# Patient Record
Sex: Male | Born: 1940 | Race: Black or African American | Hispanic: No | Marital: Single | Smoking: Former smoker
Health system: Southern US, Community
[De-identification: ages and names within clinical notes are randomized; demographics above are authoritative.]

## PROBLEM LIST (undated history)

## (undated) DIAGNOSIS — R569 Unspecified convulsions: Secondary | ICD-10-CM

## (undated) DIAGNOSIS — I10 Essential (primary) hypertension: Secondary | ICD-10-CM

## (undated) DIAGNOSIS — G309 Alzheimer's disease, unspecified: Secondary | ICD-10-CM

## (undated) DIAGNOSIS — F0280 Dementia in other diseases classified elsewhere without behavioral disturbance: Secondary | ICD-10-CM

---

## 2000-03-12 ENCOUNTER — Encounter: Admission: RE | Admit: 2000-03-12 | Discharge: 2000-03-12 | Payer: Self-pay | Admitting: Internal Medicine

## 2000-04-02 ENCOUNTER — Encounter: Admission: RE | Admit: 2000-04-02 | Discharge: 2000-04-02 | Payer: Self-pay | Admitting: Internal Medicine

## 2000-04-23 ENCOUNTER — Encounter: Admission: RE | Admit: 2000-04-23 | Discharge: 2000-04-23 | Payer: Self-pay | Admitting: Internal Medicine

## 2000-05-14 ENCOUNTER — Encounter: Admission: RE | Admit: 2000-05-14 | Discharge: 2000-05-14 | Payer: Self-pay | Admitting: Internal Medicine

## 2000-06-25 ENCOUNTER — Encounter: Admission: RE | Admit: 2000-06-25 | Discharge: 2000-06-25 | Payer: Self-pay | Admitting: Internal Medicine

## 2001-04-09 ENCOUNTER — Encounter: Admission: RE | Admit: 2001-04-09 | Discharge: 2001-04-09 | Payer: Self-pay | Admitting: Internal Medicine

## 2001-04-10 ENCOUNTER — Ambulatory Visit (HOSPITAL_COMMUNITY): Admission: RE | Admit: 2001-04-10 | Discharge: 2001-04-10 | Payer: Self-pay | Admitting: Internal Medicine

## 2001-04-10 ENCOUNTER — Encounter: Admission: RE | Admit: 2001-04-10 | Discharge: 2001-04-10 | Payer: Self-pay | Admitting: Internal Medicine

## 2001-04-10 ENCOUNTER — Encounter: Payer: Self-pay | Admitting: Internal Medicine

## 2001-04-10 ENCOUNTER — Inpatient Hospital Stay (HOSPITAL_COMMUNITY): Admission: EM | Admit: 2001-04-10 | Discharge: 2001-04-17 | Payer: Self-pay | Admitting: Emergency Medicine

## 2001-04-13 ENCOUNTER — Encounter: Payer: Self-pay | Admitting: Internal Medicine

## 2001-04-14 ENCOUNTER — Encounter: Payer: Self-pay | Admitting: Internal Medicine

## 2001-04-25 ENCOUNTER — Encounter: Admission: RE | Admit: 2001-04-25 | Discharge: 2001-04-25 | Payer: Self-pay | Admitting: Internal Medicine

## 2001-06-24 ENCOUNTER — Encounter: Admission: RE | Admit: 2001-06-24 | Discharge: 2001-06-24 | Payer: Self-pay | Admitting: Internal Medicine

## 2001-08-29 ENCOUNTER — Encounter: Admission: RE | Admit: 2001-08-29 | Discharge: 2001-08-29 | Payer: Self-pay | Admitting: Internal Medicine

## 2012-11-23 ENCOUNTER — Observation Stay (HOSPITAL_COMMUNITY): Payer: Medicare Other

## 2012-11-23 ENCOUNTER — Emergency Department (HOSPITAL_COMMUNITY): Payer: Medicare Other

## 2012-11-23 ENCOUNTER — Encounter (HOSPITAL_COMMUNITY): Payer: Self-pay | Admitting: *Deleted

## 2012-11-23 ENCOUNTER — Inpatient Hospital Stay (HOSPITAL_COMMUNITY)
Admission: EM | Admit: 2012-11-23 | Discharge: 2012-11-29 | DRG: 064 | Disposition: A | Discharge status: Skilled Nursing Facility | Payer: Medicare Other | Attending: Family Medicine | Admitting: Family Medicine

## 2012-11-23 DIAGNOSIS — R269 Unspecified abnormalities of gait and mobility: Secondary | ICD-10-CM | POA: Diagnosis present

## 2012-11-23 DIAGNOSIS — F0150 Vascular dementia without behavioral disturbance: Secondary | ICD-10-CM | POA: Diagnosis present

## 2012-11-23 DIAGNOSIS — R32 Unspecified urinary incontinence: Secondary | ICD-10-CM | POA: Diagnosis present

## 2012-11-23 DIAGNOSIS — I739 Peripheral vascular disease, unspecified: Secondary | ICD-10-CM | POA: Diagnosis present

## 2012-11-23 DIAGNOSIS — F1021 Alcohol dependence, in remission: Secondary | ICD-10-CM | POA: Diagnosis present

## 2012-11-23 DIAGNOSIS — Z9181 History of falling: Secondary | ICD-10-CM

## 2012-11-23 DIAGNOSIS — A530 Latent syphilis, unspecified as early or late: Secondary | ICD-10-CM | POA: Diagnosis present

## 2012-11-23 DIAGNOSIS — I498 Other specified cardiac arrhythmias: Secondary | ICD-10-CM | POA: Diagnosis present

## 2012-11-23 DIAGNOSIS — F17200 Nicotine dependence, unspecified, uncomplicated: Secondary | ICD-10-CM | POA: Diagnosis present

## 2012-11-23 DIAGNOSIS — I4891 Unspecified atrial fibrillation: Secondary | ICD-10-CM | POA: Diagnosis present

## 2012-11-23 DIAGNOSIS — Z8673 Personal history of transient ischemic attack (TIA), and cerebral infarction without residual deficits: Secondary | ICD-10-CM

## 2012-11-23 DIAGNOSIS — I6350 Cerebral infarction due to unspecified occlusion or stenosis of unspecified cerebral artery: Principal | ICD-10-CM | POA: Diagnosis present

## 2012-11-23 DIAGNOSIS — F4489 Other dissociative and conversion disorders: Secondary | ICD-10-CM

## 2012-11-23 DIAGNOSIS — G9340 Encephalopathy, unspecified: Secondary | ICD-10-CM | POA: Diagnosis present

## 2012-11-23 DIAGNOSIS — E873 Alkalosis: Secondary | ICD-10-CM | POA: Diagnosis present

## 2012-11-23 DIAGNOSIS — I63531 Cerebral infarction due to unspecified occlusion or stenosis of right posterior cerebral artery: Secondary | ICD-10-CM

## 2012-11-23 DIAGNOSIS — I10 Essential (primary) hypertension: Secondary | ICD-10-CM | POA: Diagnosis present

## 2012-11-23 DIAGNOSIS — A5210 Symptomatic neurosyphilis, unspecified: Secondary | ICD-10-CM

## 2012-11-23 DIAGNOSIS — E876 Hypokalemia: Secondary | ICD-10-CM | POA: Diagnosis present

## 2012-11-23 HISTORY — DX: Essential (primary) hypertension: I10

## 2012-11-23 LAB — COMPREHENSIVE METABOLIC PANEL
Alkaline Phosphatase: 66 U/L (ref 39–117)
BUN: 15 mg/dL (ref 6–23)
CO2: 36 mEq/L — ABNORMAL HIGH (ref 19–32)
Creatinine, Ser: 1.07 mg/dL (ref 0.50–1.35)
GFR calc Af Amer: 80 mL/min — ABNORMAL LOW (ref >90)
GFR calc non Af Amer: 69 mL/min — ABNORMAL LOW (ref >90)
Glucose, Bld: 102 mg/dL — ABNORMAL HIGH (ref 70–99)
Sodium: 141 mEq/L (ref 135–145)
Total Bilirubin: 0.4 mg/dL (ref 0.3–1.2)
Total Protein: 8.1 g/dL (ref 6.0–8.3)

## 2012-11-23 LAB — CBC
Hemoglobin: 14.5 g/dL (ref 13.0–17.0)
MCH: 32.5 pg (ref 26.0–34.0)
MCH: 32 pg (ref 26.0–34.0)
MCHC: 35.8 g/dL (ref 30.0–36.0)
MCV: 90.7 fL (ref 78.0–100.0)
MCV: 90.9 fL (ref 78.0–100.0)
Platelets: 209 10*3/uL (ref 150–400)
Platelets: 200 10*3/uL (ref 150–400)
RBC: 4.53 MIL/uL (ref 4.22–5.81)
RDW: 14 % (ref 11.5–15.5)
WBC: 5.4 10*3/uL (ref 4.0–10.5)

## 2012-11-23 LAB — URINALYSIS, ROUTINE W REFLEX MICROSCOPIC
Bilirubin Urine: NEGATIVE
Ketones, ur: NEGATIVE mg/dL
Leukocytes, UA: NEGATIVE
Nitrite: NEGATIVE
Protein, ur: NEGATIVE mg/dL

## 2012-11-23 LAB — RAPID URINE DRUG SCREEN, HOSP PERFORMED
Amphetamines: NOT DETECTED
Barbiturates: NOT DETECTED
Benzodiazepines: NOT DETECTED
Opiates: NOT DETECTED
Tetrahydrocannabinol: NOT DETECTED

## 2012-11-23 LAB — GLUCOSE, CAPILLARY: Glucose-Capillary: 104 mg/dL — ABNORMAL HIGH (ref 70–99)

## 2012-11-23 MED ORDER — ASPIRIN 81 MG PO CHEW
324.0000 mg | CHEWABLE_TABLET | Freq: Once | ORAL | Status: AC
  Administered 2012-11-23: 324 mg via ORAL
  Filled 2012-11-23: qty 4

## 2012-11-23 MED ORDER — CLONIDINE HCL 0.1 MG PO TABS
0.1000 mg | ORAL_TABLET | Freq: Once | ORAL | Status: AC
  Administered 2012-11-23: 0.1 mg via ORAL
  Filled 2012-11-23: qty 1

## 2012-11-23 MED ORDER — THIAMINE HCL 100 MG/ML IJ SOLN
100.0000 mg | Freq: Every day | INTRAMUSCULAR | Status: DC
  Filled 2012-11-23: qty 1

## 2012-11-23 MED ORDER — ASPIRIN EC 81 MG PO TBEC
81.0000 mg | DELAYED_RELEASE_TABLET | Freq: Every day | ORAL | Status: DC
  Administered 2012-11-23 – 2012-11-29 (×7): 81 mg via ORAL
  Filled 2012-11-23 (×7): qty 1

## 2012-11-23 MED ORDER — SODIUM CHLORIDE 0.9 % IJ SOLN: 3.0000 mL | INTRAMUSCULAR | Status: DC | PRN

## 2012-11-23 MED ORDER — HEPARIN SODIUM (PORCINE) 5000 UNIT/ML IJ SOLN
5000.0000 [IU] | Freq: Three times a day (TID) | INTRAMUSCULAR | Status: DC
  Administered 2012-11-23 – 2012-11-29 (×15): 5000 [IU] via SUBCUTANEOUS
  Filled 2012-11-23 (×20): qty 1

## 2012-11-23 MED ORDER — SODIUM CHLORIDE 0.9 % IV BOLUS (SEPSIS)
500.0000 mL | Freq: Once | INTRAVENOUS | Status: AC
  Administered 2012-11-23: 500 mL via INTRAVENOUS

## 2012-11-23 MED ORDER — INFLUENZA VAC SPLIT QUAD 0.5 ML IM SUSP
0.5000 mL | INTRAMUSCULAR | Status: AC
  Administered 2012-11-24: 11:00:00 0.5 mL via INTRAMUSCULAR
  Filled 2012-11-23: qty 0.5

## 2012-11-23 MED ORDER — ASPIRIN 81 MG PO CHEW: 324.0000 mg | CHEWABLE_TABLET | Freq: Once | ORAL | Status: DC

## 2012-11-23 MED ORDER — SODIUM CHLORIDE 0.9 % IJ SOLN
3.0000 mL | Freq: Two times a day (BID) | INTRAMUSCULAR | Status: DC
  Administered 2012-11-23 – 2012-11-29 (×7): 3 mL via INTRAVENOUS

## 2012-11-23 MED ORDER — SODIUM CHLORIDE 0.9 % IV SOLN: 250.0000 mL | INTRAVENOUS | Status: DC | PRN

## 2012-11-23 MED ORDER — HYDRALAZINE HCL 20 MG/ML IJ SOLN: 2.0000 mg | INTRAMUSCULAR | Status: DC | PRN

## 2012-11-23 MED ORDER — PNEUMOCOCCAL VAC POLYVALENT 25 MCG/0.5ML IJ INJ
0.5000 mL | INJECTION | INTRAMUSCULAR | Status: AC
  Administered 2012-11-24: 11:00:00 0.5 mL via INTRAMUSCULAR
  Filled 2012-11-23: qty 0.5

## 2012-11-23 MED ORDER — SODIUM CHLORIDE 0.9 % IJ SOLN
3.0000 mL | Freq: Two times a day (BID) | INTRAMUSCULAR | Status: DC
  Administered 2012-11-24 – 2012-11-29 (×7): 3 mL via INTRAVENOUS

## 2012-11-23 NOTE — ED Notes (Signed)
Heavy drinker until 5 years ago.  Hx hypertension

## 2012-11-23 NOTE — ED Notes (Addendum)
Per pt's niece pt fell yesterday and hit his head on the door. Pt is having increased confusion for about a week. Per niece he has been having some issues with his memory before the fall but it has been getting increasingly worse. Pt has been falling.

## 2012-11-23 NOTE — H&P (Signed)
Family Medicine [facility] Admission History and Physical Service Pager: [phone]  Patient name: Joshua Gonzales Medical record number: [id number] Date of birth: Sep 29, 1940 Age: 72 y.o. Gender: male  Primary Care Provider: Pcp Not In System Consultants: None Code Status: Full  Chief Complaint: confusion and increased falls  Assessment and Plan: Joshua Gonzales is a 72 y.o. male presenting with encephalopathy for the past 3 weeks, worsening acutely. PMH is significant for heavy drinking and smoking, uncontrolled HTN and afib  # Encephalopathy and multiple prev infarcts/atrophy - DDx includes vascular dementia vs toxic encephalopathy vs vitamin deficiency vs residual stroke deficit vs infectious. Per pt's niece has been acting increasing confused in the last 2-3 weeks. Extensive drinking hx concerning for wernicke's encephalopathy especially in light of cautious gait. Metabolic panel looks relatively unremarkable however will obtain B12/folate levels as well as ammonia. Possible supratherapeutic ingestion of tylenol or salicylates given confusion, will obtain levels here. Also concern for tia given intermittent episodes of worsening mental status. However also with steady decline and findings of multiple prev infarcts and extensive microvascular dx on CT most consistent with a vascular dementia picture. Has not followed closely with PCP so unclear pt's baseline and potential for progressive medical illness that has gone undiagnosed for long period of time. Seems to have severely uncontrolled HTN which also puts pt a risk for cerebral vasc risk. Does not appear to be infectious as pt afebrile and normal HR and no clear source. UA clean. UDS neg, alcohol nml. Plan: -swallow study -CIWA- given chronic alcohol use -ammonia -b12, folate -start thiamine 100mg  IV daily -tele -vs -consider blood cx if febrile -rectal exam and gait eval in am -re-clarify code status  #Prev abnormal  stress test, abn EKG today, irregular rhythm on exam Prior NM study 2003 with sm reversible perfusion defect at anteroseptal portiion of left ventricular apex. Normal EF, question of mild hypokinesia. EKG today shows u-waves in II, III, Avf by my read concerning for prior inferior infarct? No st elevations. At this time do not believe this to be MI/cardiac picture but will obtain morning EKG, cycle trops.Does have hx of smoking.  Plan: -risk stratify- TSH, A1C, Lipids -cycle trops X2 -repeat EKG am -consider ECHO -ASA 81 -telemetry - monitor for atrial fibrillation and consider anticoag though ?risk>benefit  #HTN- systolics 120s-200s. Could this be a hypertensive emergency picture? Patient does not follow with PCP and notable for poor dietary/exercise health. Niece was not aware that he has a hx of high BP so was not monitoring at home. S/p clonidine in the ED. Plan -hydralazine prn SBP>180 or DBP>100 -will hold on bb given slight brady and monitor  #metabolic alkalosis- bicarb mildly elevated 36. No other electrolyte imbalances. Can be 2/2 to GI or renal hydrogen loss. Does not appear to be intracellular shift of hydrogen given normal K.  Plan -monitor vs -repeat BMET am  FEN/GI: Bedside swallow with normal swallow done by nursing; ordering regular diet Prophylaxis: heparin SQ  Disposition: admit to tele for obvs, w/up of ams  History of Present Illness: Joshua Gonzales is a 72 y.o. male without PCP or known hx of cva or dementia who presents with AMS for the past 3 weeks associated with intermittent urinary incontinence progressively getting worse. Has extensive hx of smoking and heavy drinking. Lives with niece who noticed him acting unusual and having increased falls, one of which caused him to hit his head yesterday. No syncopal episodes. No chest pain/palps/fluid overload/cardiovascular history  that he can remember. Feels that he just mis-stepped. Believes his niece to be his sister.  Also has been having memory problems for the last week. Does not go to a PCP on a regular basis. No other new changes to his current health.  In the ED, CT with multiple prior infarctions and extensive microvascular disease, no acute process. U/A clean. CBG 104. BP 200s/100s in ED, being given clonidine.  Review Of Systems: Per HPI with the following additions: Patient denies abd pain, fevers, chills. Otherwise 12 point review of systems was performed and was unremarkable.  There are no active problems to display for this patient.  Past Medical History: Past Medical History  Diagnosis Date  . Hypertension   ? Remote hx of dx with "irregular heart rate" he was told about at a previous job  Past Surgical History: History reviewed. No pertinent past surgical history. Social History: History  Substance Use Topics  . Smoking status: Current Every Day Smoker  . Smokeless tobacco: Not on file  . Alcohol Use: No   Additional social history:  - chronic heavy alcohol use (none after moved in with niece in last 4 months), smoking use chronically, denies drug use other than distant hx of marijuana use - lives with niece, prev lived with nephew for the past 3 year - Does not work - Able to care for self though with increasing confusion, niece has no longer allowed him to cook  Please also refer to relevant sections of EMR.  Family History: No family history on file. Allergies and Medications: No Known Allergies No current facility-administered medications on file prior to encounter.   No current outpatient prescriptions on file prior to encounter.    Objective: BP 167/88  Pulse 67  Temp(Src) 99.5 F (37.5 C) (Oral)  Resp 16  SpO2 100% Exam: General: NAD, sitting quietly in bed, niece at side HEENT: no exudates, nml pharnyx, PERRL, EOMI, cotton wool spots noted on left eye, no lymphadenopathy, o/p clear, MMM Cardiovascular: irreg, irreg with no murmurs or rubs, 2+ bilateral  radial pulses Respiratory: CTAB, normal effort Abdomen: s/nt/nd, no hepatosplenomegaly Extremities: warm and well perfused Skin: no jaundice, bruising or bleeding Neuro: confused as to why he is here, not oriented to date or city but knows in [facility] hospital, no focal deficits, no asterixis, normal speech, CN 2-12 tested and in tact with good shrug, grimace, tongue movement, sternocleidomastoid strength, sensation bilateral face equal, PERRL, EOMI, forehead wrinkle, and upper extremity strength and sound grossly equal bilaterally  Labs and Imaging: CBC BMET   Recent Labs Lab 11/23/12 1614  WBC 5.4  HGB 15.7  HCT 43.8  PLT 209    Recent Labs Lab 11/23/12 1614  NA 141  K 3.5  CL 98  CO2 36*  BUN 15  CREATININE 1.07  GLUCOSE 102*  CALCIUM 9.5     CT head IMPRESSION:  Atrophy with multiple prior infarcts and extensive small vessel  disease. No acute appearing infarct is appreciable on this study.  There is no hemorrhage or mass effect.   Anselm Lis, MD 11/23/2012, 6:54 PM PGY-1, [facility] FPTS Intern pager: [phone], text pages welcome  I have seen and examined patient with PGY-1 and agree with assessment and plan detailed above with my additions in blue. Leona Singleton, MD 11/23/2012 8:58 PM PGY-2

## 2012-11-23 NOTE — ED Provider Notes (Signed)
CSN: [id number]     Arrival date & time 11/23/12  1600 History   First MD Initiated Contact with Patient 11/23/12 1626     Chief Complaint  Patient presents with  . Altered Mental Status   (Consider location/radiation/quality/duration/timing/severity/associated sxs/prior Treatment) HPI Comments: 72 yo male with smoking, etoh hx presents with niece for gradually worsening confusion the past 3 wks and intermittent urinary incontinence.  No known stroke or cns hx.  Pt has had a few falls the past week, hit head once.  No loc.  Pt denies other sxs except fatigue and lightheaded with standing.  Nothing improves.  No known dementia or neuro hx.   Patient is a 72 y.o. male presenting with altered mental status. The history is provided by the patient and a relative.  Altered Mental Status Presenting symptoms: confusion   Associated symptoms: light-headedness   Associated symptoms: no abdominal pain, no fever, no headaches, no rash and no vomiting     Past Medical History  Diagnosis Date  . Hypertension    History reviewed. No pertinent past surgical history. No family history on file. History  Substance Use Topics  . Smoking status: Current Every Day Smoker  . Smokeless tobacco: Not on file  . Alcohol Use: No    Review of Systems  Constitutional: Positive for fatigue. Negative for fever and chills.  HENT: Negative for neck pain and neck stiffness.   Eyes: Negative for visual disturbance.  Respiratory: Negative for shortness of breath.   Cardiovascular: Negative for chest pain.  Gastrointestinal: Negative for vomiting and abdominal pain.  Genitourinary: Negative for dysuria and flank pain.  Musculoskeletal: Negative for back pain.  Skin: Negative for rash.  Neurological: Positive for light-headedness. Negative for syncope, speech difficulty and headaches.  Psychiatric/Behavioral: Positive for confusion.    Allergies  Review of patient's allergies indicates not on file.  Home  Medications  No current outpatient prescriptions on file. BP 155/85  Pulse 83  Temp(Src) 99.5 F (37.5 C) (Oral)  Resp 24  SpO2 96% Physical Exam  Nursing note and vitals reviewed. Constitutional: He appears well-developed and well-nourished.  HENT:  Head: Normocephalic and atraumatic.  Eyes: Conjunctivae are normal. Right eye exhibits no discharge. Left eye exhibits no discharge.  Neck: Normal range of motion. Neck supple. No tracheal deviation present.  Cardiovascular: Normal rate and regular rhythm.   Pulmonary/Chest: Effort normal and breath sounds normal.  Abdominal: Soft. He exhibits no distension. There is no tenderness. There is no guarding.  Musculoskeletal: He exhibits no edema and no tenderness.  Neurological: He is alert. No cranial nerve deficit. Coordination normal. GCS eye subscore is 4. GCS verbal subscore is 5. GCS motor subscore is 6.  4+ strength in UE and LE with f/e at major joints. Sensation to palpation intact in UE and LE. CNs 2-12 grossly intact.  EOMFI.  PERRL.   Finger nose and coordination intact bilateral.   Visual fields intact to finger testing. Mild general weakness Alert to place and name Intermittent confusion Cautious gait Knows date, location, name, dob Incorrectly named niece  Skin: Skin is warm. No rash noted.  Psychiatric: He has a normal mood and affect.    ED Course  Procedures (including critical care time) Labs Review Labs Reviewed  COMPREHENSIVE METABOLIC PANEL - Abnormal; Notable for the following:    CO2 36 (*)    Glucose, Bld 102 (*)    GFR calc non Af Amer 69 (*)    GFR calc Af Amer 80 (*)  All other components within normal limits  GLUCOSE, CAPILLARY - Abnormal; Notable for the following:    Glucose-Capillary 164 (*)    All other components within normal limits  GLUCOSE, CAPILLARY - Abnormal; Notable for the following:    Glucose-Capillary 104 (*)    All other components within normal limits  CREATININE, SERUM -  Abnormal; Notable for the following:    GFR calc non Af Amer 75 (*)    GFR calc Af Amer 87 (*)    All other components within normal limits  COMPREHENSIVE METABOLIC PANEL - Abnormal; Notable for the following:    Potassium 3.0 (*)    Albumin 3.2 (*)    GFR calc non Af Amer 84 (*)    All other components within normal limits  SALICYLATE LEVEL - Abnormal; Notable for the following:    Salicylate Lvl 2.6 (*)    All other components within normal limits  HEMOGLOBIN A1C - Abnormal; Notable for the following:    Hemoglobin A1C 6.7 (*)    Mean Plasma Glucose 146 (*)    All other components within normal limits  RPR - Abnormal; Notable for the following:    RPR Reactive (*)    All other components within normal limits  RPR TITER - Abnormal; Notable for the following:    RPR Titer 1:16 (*)    All other components within normal limits  CBC  URINALYSIS, ROUTINE W REFLEX MICROSCOPIC  URINE RAPID DRUG SCREEN (HOSP PERFORMED)  ETHANOL  CBC  AMMONIA  VITAMIN B12  TROPONIN I  TROPONIN I  ACETAMINOPHEN LEVEL  TSH  LIPID PANEL  HIV ANTIBODY (ROUTINE TESTING)  FOLATE RBC  BASIC METABOLIC PANEL   Imaging Review No results found.  MDM  No diagnosis found. Broad differential with mild intermittent confusion from metabolic, UTI, CNS bleed/ stroke, Hydrocephalus, dehydration, other. Labs, CT head, fluids.  X-ray Chest Pa And Lateral   11/23/2012   *RADIOLOGY REPORT*  Clinical Data: Subacute encephalopathy.  Stroke and hypertension.  CHEST - 2 VIEW  Comparison: None.  Findings: Heart size appears upper normal.  Thoracic aorta and hilar contours are normal.  Advanced emphysematous changes are present.  No focal airspace opacity, or pneumothorax.  The left costophrenic angle is partially excluded from the image.  No visible pleural effusion. No acute osseous abnormality.  IMPRESSION: Advanced emphysematous changes.  No definite acute findings.   Original Report Authenticated By: Britta Mccreedy,  M.D.   Ct Head Wo Contrast  11/23/2012   CLINICAL DATA:  Altered mental status ; recent trauma  EXAM: CT HEAD WITHOUT CONTRAST  TECHNIQUE: Contiguous axial images were obtained from the base of the skull through the vertex without intravenous contrast. Study was obtained within 24 hr of patient's arrival at the emergency department.  COMPARISON:  September 11, 2002  FINDINGS: There is moderate diffuse atrophy. There is no demonstrable mass, hemorrhage, extra-axial fluid collection, or midline shift. There is evidence of an old infarct in the inferior posterior left cerebellum with encephalomalacia in this area. There is evidence of a prior infarct at the junction of the the right temporal, occipital, and parietal lobes. There is evidence of a prior infarct in the medial right frontal lobe. There is widespread small vessel disease throughout the centra semiovale bilaterally. There is evidence of a prior small infarct involving portions of the anterior limb of the left internal capsule and left globus pallidum. There is evidence of a prior infarct in a portion of the head of the  caudate nucleus on the right as well as the anterior right lentiform nucleus. There is evidence of prior small infarct in the anterior superior left thalamus. No convincing acute infarct is seen on this study.  Bony calvarium appears intact. Mastoid air cells are clear.  IMPRESSION: Atrophy with multiple prior infarcts and extensive small vessel disease. No acute appearing infarct is appreciable on this study. There is no hemorrhage or mass effect.   Electronically Signed   By: Bretta Bang   On: 11/23/2012 17:14   Spoke with medicine, accepted for further eval of confusion and strokes.  Prior infarcts, Confusion  Enid Skeens, MD 11/25/12 [phone]

## 2012-11-23 NOTE — ED Notes (Signed)
Family Practice at bedside.

## 2012-11-23 NOTE — Progress Notes (Signed)
MD order a bedside swallow evaluation for patient. Nurse from [facility] [address] was unable to assist patient. MD asked that patient just be checked for ability to swallow. RN and charge nurse gave patient small sips of water before allowing him to swallow a small cup of water. Patient swallowed water without coughing or choking. Patient denies pain, chills, SOB. Will continue to monitor.

## 2012-11-23 NOTE — Progress Notes (Signed)
Pt admitted to the unit with niece. Pt is stable, alert and oriented per baseline. Oriented to room, staff, and call bell. Educated to call for any assistance. Bed in lowest position, call bell within reach- will continue to monitor.

## 2012-11-23 NOTE — ED Notes (Signed)
Dr Zavitz at bedside  

## 2012-11-23 NOTE — ED Notes (Signed)
The pt is confused he is disoriented .  The pt has been incontinent of urine .  He thinks this is Wednesday 1999 and the pres is [place].  The pt came in saying that his father died today.  Then he said that his brother died today and his father was sick.  A niece with him reports that his father died Jul 31, 2022 and his brother has been dead for years.  Alert he denies pain even though he fell yesterday.  He has been getting worse for a week.  He does not recognize his sisters.

## 2012-11-24 DIAGNOSIS — I10 Essential (primary) hypertension: Secondary | ICD-10-CM

## 2012-11-24 DIAGNOSIS — I498 Other specified cardiac arrhythmias: Secondary | ICD-10-CM

## 2012-11-24 LAB — LIPID PANEL
Cholesterol: 117 mg/dL (ref 0–200)
HDL: 43 mg/dL (ref >39)
Total CHOL/HDL Ratio: 2.7 RATIO
Triglycerides: 75 mg/dL (ref <150)

## 2012-11-24 LAB — ACETAMINOPHEN LEVEL: Acetaminophen (Tylenol), Serum: <15 ug/mL (ref 10–30)

## 2012-11-24 LAB — COMPREHENSIVE METABOLIC PANEL
ALT: 6 U/L (ref 0–53)
AST: 15 U/L (ref 0–37)
Albumin: 3.2 g/dL — ABNORMAL LOW (ref 3.5–5.2)
Alkaline Phosphatase: 59 U/L (ref 39–117)
CO2: 29 mEq/L (ref 19–32)
Creatinine, Ser: 0.91 mg/dL (ref 0.50–1.35)
GFR calc Af Amer: >90 mL/min (ref >90)
Glucose, Bld: 91 mg/dL (ref 70–99)
Potassium: 3 mEq/L — ABNORMAL LOW (ref 3.5–5.1)
Sodium: 135 mEq/L (ref 135–145)
Total Protein: 6.8 g/dL (ref 6.0–8.3)

## 2012-11-24 LAB — HIV ANTIBODY (ROUTINE TESTING W REFLEX): HIV: NONREACTIVE

## 2012-11-24 LAB — HEMOGLOBIN A1C
Hgb A1c MFr Bld: 6.7 % — ABNORMAL HIGH (ref <5.7)
Mean Plasma Glucose: 146 mg/dL — ABNORMAL HIGH (ref <117)

## 2012-11-24 LAB — CREATININE, SERUM
Creatinine, Ser: 1 mg/dL (ref 0.50–1.35)
GFR calc Af Amer: 87 mL/min — ABNORMAL LOW (ref >90)
GFR calc non Af Amer: 75 mL/min — ABNORMAL LOW (ref >90)

## 2012-11-24 LAB — VITAMIN B12: Vitamin B-12: 270 pg/mL (ref 211–911)

## 2012-11-24 LAB — AMMONIA: Ammonia: 31 umol/L (ref 11–60)

## 2012-11-24 MED ORDER — LORAZEPAM 2 MG/ML IJ SOLN
1.0000 mg | Freq: Four times a day (QID) | INTRAMUSCULAR | Status: AC | PRN
  Administered 2012-11-24 – 2012-11-26 (×2): 1 mg via INTRAVENOUS
  Filled 2012-11-24 (×2): qty 1

## 2012-11-24 MED ORDER — LORAZEPAM 2 MG/ML IJ SOLN
2.0000 mg | Freq: Once | INTRAMUSCULAR | Status: AC
  Administered 2012-11-24: 2 mg via INTRAVENOUS
  Filled 2012-11-24: qty 1

## 2012-11-24 MED ORDER — THIAMINE HCL 100 MG/ML IJ SOLN
500.0000 mg | Freq: Three times a day (TID) | INTRAMUSCULAR | Status: DC
  Filled 2012-11-24 (×2): qty 5

## 2012-11-24 MED ORDER — THIAMINE HCL 100 MG/ML IJ SOLN
250.0000 mg | INTRAVENOUS | Status: DC
  Administered 2012-11-28 – 2012-11-29 (×2): 250 mg via INTRAVENOUS
  Filled 2012-11-24 (×2): qty 2.5

## 2012-11-24 MED ORDER — LORAZEPAM 1 MG PO TABS
1.0000 mg | ORAL_TABLET | Freq: Four times a day (QID) | ORAL | Status: AC | PRN
  Administered 2012-11-25 (×2): 1 mg via ORAL
  Filled 2012-11-24 (×2): qty 1

## 2012-11-24 MED ORDER — THIAMINE HCL 100 MG/ML IJ SOLN
500.0000 mg | Freq: Three times a day (TID) | INTRAVENOUS | Status: AC
  Administered 2012-11-24 – 2012-11-27 (×9): 500 mg via INTRAVENOUS
  Filled 2012-11-24 (×9): qty 5

## 2012-11-24 MED ORDER — AMLODIPINE BESYLATE 5 MG PO TABS: 5.0000 mg | ORAL_TABLET | Freq: Every day | ORAL | Status: DC

## 2012-11-24 MED ORDER — FOLIC ACID 1 MG PO TABS
1.0000 mg | ORAL_TABLET | Freq: Every day | ORAL | Status: DC
  Administered 2012-11-24 – 2012-11-29 (×6): 1 mg via ORAL
  Filled 2012-11-24 (×6): qty 1

## 2012-11-24 MED ORDER — AMLODIPINE BESYLATE 2.5 MG PO TABS
2.5000 mg | ORAL_TABLET | Freq: Every day | ORAL | Status: DC
  Administered 2012-11-24: 2.5 mg via ORAL
  Filled 2012-11-24: qty 1

## 2012-11-24 MED ORDER — POTASSIUM CHLORIDE CRYS ER 20 MEQ PO TBCR
30.0000 meq | EXTENDED_RELEASE_TABLET | Freq: Two times a day (BID) | ORAL | Status: AC
  Administered 2012-11-24 (×2): 30 meq via ORAL
  Filled 2012-11-24 (×3): qty 1

## 2012-11-24 MED ORDER — THIAMINE HCL 100 MG/ML IJ SOLN: 250.0000 mg | Freq: Every day | INTRAMUSCULAR | Status: DC

## 2012-11-24 MED ORDER — ADULT MULTIVITAMIN W/MINERALS CH
1.0000 | ORAL_TABLET | Freq: Every day | ORAL | Status: DC
  Administered 2012-11-24 – 2012-11-29 (×6): 1 via ORAL
  Filled 2012-11-24 (×6): qty 1

## 2012-11-24 MED ORDER — AMLODIPINE BESYLATE 10 MG PO TABS
10.0000 mg | ORAL_TABLET | Freq: Every day | ORAL | Status: DC
  Administered 2012-11-25 – 2012-11-29 (×5): 10 mg via ORAL
  Filled 2012-11-24 (×5): qty 1

## 2012-11-24 MED ORDER — VITAMIN B-1 100 MG PO TABS
100.0000 mg | ORAL_TABLET | Freq: Every day | ORAL | Status: DC
  Administered 2012-11-24: 13:00:00 100 mg via ORAL
  Filled 2012-11-24: qty 1

## 2012-11-24 MED ORDER — AMLODIPINE BESYLATE 5 MG PO TABS
5.0000 mg | ORAL_TABLET | Freq: Every day | ORAL | Status: AC
  Administered 2012-11-24: 22:00:00 5 mg via ORAL
  Filled 2012-11-24: qty 1

## 2012-11-24 NOTE — Evaluation (Signed)
Physical Therapy Evaluation Patient Details Name: Joshua Gonzales MRN: [id number] DOB: 1941-01-27 Today's Date: 11/24/2012 Time: 7829-5621 PT Time Calculation (min): 21 min  PT Assessment / Plan / Recommendation History of Present Illness    Joshua Gonzales is a 72 y.o. male presenting with encephalopathy for the past 3 weeks, worsening acutely. PMH is significant for heavy drinking and smoking, uncontrolled HTN and afib   Clinical Impression  Pt presents with moderate limitations to functional mobility primarily due to metal status, safety awareness and judgement.  Noted functional balance problems, and pt is fall risk.  Unclear if pt has adequate family support to d/c home safely so recommend pursue SNF placement unless has 24 supervision, primarily due to risk of fall/fall related injury and risk for rehospitalization.  Will benefit from PT in acute setting to address functional problems.  Thanks.    PT Assessment  Patient needs continued PT services    Follow Up Recommendations  SNF;Supervision/Assistance - 24 hour    Does the patient have the potential to tolerate intense rehabilitation      Barriers to Discharge Decreased caregiver support (unclear if pt has adequate support for 24 supervision)      Equipment Recommendations  None recommended by PT    Recommendations for Other Services     Frequency Min 3X/week    Precautions / Restrictions Precautions Precautions: Fall Precaution Comments: bed alarm, weakness, impulsivity/confusion   Pertinent Vitals/Pain no apparent distress       Mobility  Bed Mobility Bed Mobility: Supine to Sit;Sit to Supine Supine to Sit: 5: Supervision Sit to Supine: 5: Supervision Details for Bed Mobility Assistance: supervision for safety due to impulsivity and fall risk Transfers Transfers: Sit to Stand;Stand to Sit Sit to Stand: 5: Supervision Stand to Sit: 5: Supervision Details for Transfer Assistance: for safety and  impuslivity Ambulation/Gait Ambulation/Gait Assistance: 4: Min assist Ambulation Distance (Feet): 300 Feet Assistive device: 1 person hand held assist Ambulation/Gait Assistance Details: cues to navigate, to attend to environment and to follow direction, with 1-2 minor loss of balance noted when turning head Gait Pattern: Step-through pattern;Trunk flexed Gait velocity: 1.5 ft/sec (predicts dependency and fall risk) General Gait Details: states he feels he's walking slower than usual Stairs: No Wheelchair Mobility Wheelchair Mobility: No    Exercises     PT Diagnosis: Difficulty walking  PT Problem List: Cardiopulmonary status limiting activity;Decreased safety awareness;Decreased cognition;Decreased mobility;Decreased balance PT Treatment Interventions: Patient/family education;Cognitive remediation;Balance training;Therapeutic exercise;Therapeutic activities;Functional mobility training;Stair training;Gait training     PT Goals(Current goals can be found in the care plan section) Acute Rehab PT Goals PT Goal Formulation: Patient unable to participate in goal setting Time For Goal Achievement: 12/08/12 Potential to Achieve Goals: Good  Visit Information  Last PT Received On: 11/24/12 Assistance Needed: +1       Prior Functioning  Home Living Family/patient expects to be discharged to:: Private residence Living Arrangements: Other relatives Available Help at Discharge: Family Type of Home: House Home Access: Stairs to enter Secretary/administrator of Steps: 1 (onto porch) Home Layout: One level Additional Comments: unable to reliably assess prior level or home living due to metal confusion Prior Function Level of Independence: Independent with assistive device(s);Independent Communication Communication: No difficulties    Cognition  Cognition Arousal/Alertness: Awake/alert Behavior During Therapy: Restless (asking where his clothes went; distractible) Overall  Cognitive Status: No family/caregiver present to determine baseline cognitive functioning Area of Impairment: Safety/judgement;Awareness;Problem solving Memory: Decreased recall of precautions Safety/Judgement: Decreased awareness of  safety Problem Solving: Slow processing;Requires verbal cues General Comments: could not find room without navigational cues Difficult to assess due to:  (confustion)    Extremity/Trunk Assessment Upper Extremity Assessment Upper Extremity Assessment: Overall WFL for tasks assessed Lower Extremity Assessment Lower Extremity Assessment: Overall WFL for tasks assessed   Balance Balance Balance Assessed: Yes High Level Balance High Level Balance Comments: observed during functional tasks with most risk during multitasking and walking/turning  End of Session PT - End of Session Activity Tolerance: Patient tolerated treatment well Patient left: in bed;with call bell/phone within reach;with bed alarm set;with nursing/sitter in room Nurse Communication: Mobility status  GP Functional Assessment Tool Used: clinical judgement Functional Limitation: Mobility: Walking and moving around Mobility: Walking and Moving Around Current Status ([id number]): At least 40 percent but less than 60 percent impaired, limited or restricted Mobility: Walking and Moving Around Goal Status [phone]): At least 20 percent but less than 40 percent impaired, limited or restricted   Joshua Gonzales 11/24/2012, 3:57 PM

## 2012-11-24 NOTE — Progress Notes (Signed)
RN called resident regarding patient's telemetry readings. Patient had been brady for the past 3 hours and continues to get lower. Patients HR has been between [employer]. Pt's cardiac strip tonight was brady as well, (placed in chart). RN has gone into pt's room to assess-pt is alert, denies pain, SOB, chills, and dizziness.will continue to monitor
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
## 2012-11-24 NOTE — Progress Notes (Signed)
Family Medicine Teaching Service Daily Progress Note Intern Pager: [phone]  Patient name: Joshua Gonzales Medical record number: [id number] Date of birth: [birth year] Age: 72 y.o. 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 Also consider wernicke encephalopathy vs NPH with incontinence, dementia, and ataxia. Also consider vitamin deficiency vs infectious though less likely given afebrile and normal WBC. Tylenol and salicylate levels normal. UA and UDS negative, alcohol level and ammonia normal. - Dosed thiamine 100mg  IV daily - Pt will need a PCP on discharge - CIWA - Continue to monitor though suspect much of the deficit may be permanent [ ]  Consulted neurology today for further recommendations [ ]  F/u PT/OT recs [ ]  Checking HIV/RPR today, f/u B12/folate [ ]  F/u telemetry and AM EKG [ ]  Code status needs to be clarified when family member present [ ]  with slight dizziness, monitor PO status  # Sinus arrhythmia, previous abnormal stress test - Prior NM study 2003 with small reversible perfusion defect anteroseptal portion of left ventricular apex, normal EF, ?mild hypokinesia. EKG last night uwaves concerning for prior inferior infarct, no ST elevation. Not MI picture, but cycling troponins. - trops negative x 2 - AM EKG Not done yet - Started aspirin 81 - F/u risk stratification labs (lipid panel with calculated ASCVD risk 26.2% - consider starting mod- or high-intensity statin though balance with age and dementia) - F/u telemetry for any atrial fibrillation; unlikely to anticoagulate demented patient with ataxia [ ]  Asymptomatic brady overnight - monitor  # HTN - Likely chronic,  uncontrolled, smoking hx. S/p clonidine in ED - Continue hydralazine PRN SBP >180 or DBP>100. - Holding BB given brady [ ]  Starting norvasc 2.5mg  daily today - monitor closely  # Metabolic alkalosis - bicarb 36 on admission, no other electrolyte imbalance. Pt stable. - Resolved on recheck  # Hypokalemia to 3- Cr WNL, cause unknown other than poor diet/ little intake per niece [ ]  Will dose Kdur po x 2 and recheck BMET in AM  FEN/GI: Regular diet PPx: SQ heparin  Disposition: Pending further evaluation for cause of encephalopathy, f/u PT/OT recs  Subjective: Patient pulled out IV overnight; also bradycardic to 40s, though asymptomatic. Feels well today, no chest pain, shortness of breath, thinks he feels less confused but can't remember what he was confused about. Slightly dizzy  Objective: Temp:  [97.6 F (36.4 C)-99.5 F (37.5 C)] 97.6 F (36.4 C) (09/21 0546) Pulse Rate:  [50-104] 68 (09/21 0546) Resp:  [10-24] 18 (09/21 0546) BP: (126-184)/(85-107) 165/92 mmHg (09/21 0546) SpO2:  [94 %-100 %] 94 % (09/21 0546) Weight:  [152 lb 8 oz (69.174 kg)] 152 lb 8 oz (69.174 kg) (09/20 2029) Physical Exam: General: NAD, pleasant Cardiovascular: Irregularly irregular rhythm Respiratory: CTAB, normal effort Abdomen: Soft, nontender, nondistended Extremities: No LE edema or tenderness Neuro: Normal speech, CN grossly in tact, EOMI, hesitant gait, normal rectal tone, ?soft enlarged prostate  Laboratory:  Recent Labs Lab 11/23/12 1614 11/23/12 2255  WBC 5.4 6.1  HGB 15.7 14.5  HCT 43.8 41.2  PLT 209 200    Recent Labs Lab 11/23/12 1614 11/23/12 2255 11/24/12 0227  NA 141  --  135  K 3.5  --  3.0*  CL 98  --  97  CO2 36*  --  29  BUN 15  --  14  CREATININE 1.07 1.00 0.91  CALCIUM 9.5  --  9.0  PROT 8.1  --  6.8  BILITOT 0.4  --  0.3  ALKPHOS 66  --  59  ALT 6  --  6  AST 22  --  15  GLUCOSE 102*  --  91   Urinalysis: negative Lipid Panel     Component  Value Date/Time   CHOL 117 11/24/2012 0227   TRIG 75 11/24/2012 0227   HDL 43 11/24/2012 0227   CHOLHDL 2.7 11/24/2012 0227   VLDL 15 11/24/2012 0227   LDLCALC 59 11/24/2012 0227    Troponin neg x 2  Imaging/Diagnostic Tests: Chest x-ray : emphysema   Leona Singleton, MD 11/24/2012, 9:38 AM PGY-2, [facility] FPTS Intern pager: [phone], text pages welcome

## 2012-11-24 NOTE — Progress Notes (Signed)
Patient pulled out IV. RN tried twice- did not succeed in placing one. IV team called- pt on list.

## 2012-11-24 NOTE — Progress Notes (Signed)
11/24/12 Patient very confused and agitated,Placed on CIWA, may have Ativan, Moved to another room closer to nurse's station.Pulls tele off, forgetful most of the time.

## 2012-11-24 NOTE — H&P (Addendum)
FMTS Attending Note  I personally saw and evaluated the patient. The plan of care was discussed with the resident team. I agree with the assessment and plan as documented by the resident.   72 y/o male with acute confusion vs progressing dementia, currently oriented self, no acute complaints, no pain, no sob, no chest pain, no N/V, tolerating diet  Gen: pleasant AAM, NAD, clear speech Caridac: irregular rhythm, S1 and S2 present no murmurs, no heaves/thrills Resp: CTAB, normal effort Abd: soft, nontender, bowel sounds present Neuro: CN 2-12 intact, strength 5/5 in all extremities, pronator drift normal, unsteady gait (slightly wide based with bent knees), 2+ patellar reflexes  1. Encephalopathy - evidence of numerous previous strokes on CT scan without acute bleed, clinical picture suggestive of Wernicke-Korsikoff vs NPH vs vascular dementia, agree with neurologic workup as documented by the resident, appreciate neurology input 2. CAD/sinus arrythmia - EKG showed no acute ischemic changes, telemetry showed occasional sinus bradycardia with no atrial fibrillation, agree with echo to evaluate cardiac function and cardiac risk statification  3. HTN - will start therapy with Norvasc, avoid BB due to bradycardia  Donnella Sham MD

## 2012-11-24 NOTE — Progress Notes (Signed)
FMTS Attending Note  I personally saw and evaluated the patient. The plan of care was discussed with the resident team. I agree with the assessment and plan as documented by the resident.   Eimy Plaza MD 

## 2012-11-25 DIAGNOSIS — A523 Neurosyphilis, unspecified: Secondary | ICD-10-CM

## 2012-11-25 DIAGNOSIS — F29 Unspecified psychosis not due to a substance or known physiological condition: Secondary | ICD-10-CM

## 2012-11-25 DIAGNOSIS — G9340 Encephalopathy, unspecified: Secondary | ICD-10-CM

## 2012-11-25 DIAGNOSIS — F0390 Unspecified dementia without behavioral disturbance: Secondary | ICD-10-CM

## 2012-11-25 DIAGNOSIS — I10 Essential (primary) hypertension: Secondary | ICD-10-CM

## 2012-11-25 DIAGNOSIS — I369 Nonrheumatic tricuspid valve disorder, unspecified: Secondary | ICD-10-CM

## 2012-11-25 DIAGNOSIS — R4182 Altered mental status, unspecified: Secondary | ICD-10-CM

## 2012-11-25 LAB — BASIC METABOLIC PANEL
CO2: 24 mEq/L (ref 19–32)
Calcium: 9.4 mg/dL (ref 8.4–10.5)
GFR calc Af Amer: >90 mL/min (ref >90)
GFR calc non Af Amer: >90 mL/min (ref >90)
Sodium: 134 mEq/L — ABNORMAL LOW (ref 135–145)

## 2012-11-25 LAB — FOLATE RBC: RBC Folate: 372 ng/mL (ref >=366)

## 2012-11-25 LAB — T.PALLIDUM AB, IGG: T pallidum Antibodies (TP-PA): >8 S/CO — ABNORMAL HIGH (ref <0.90)

## 2012-11-25 MED ORDER — ENSURE COMPLETE PO LIQD
237.0000 mL | Freq: Two times a day (BID) | ORAL | Status: DC
  Administered 2012-11-25 – 2012-11-29 (×8): 237 mL via ORAL

## 2012-11-25 MED ORDER — POTASSIUM CHLORIDE CRYS ER 20 MEQ PO TBCR
30.0000 meq | EXTENDED_RELEASE_TABLET | Freq: Two times a day (BID) | ORAL | Status: AC
  Administered 2012-11-25 (×2): 30 meq via ORAL
  Filled 2012-11-25 (×3): qty 1
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
## 2012-11-25 NOTE — Progress Notes (Signed)
NEURO HOSPITALIST PROGRESS NOTE   SUBJECTIVE:                                                                                                                        Patient has no complaints.  He is awake oriented to place but not year or date. Follows all commands.   OBJECTIVE:                                                                                                                           Vital signs in last 24 hours: Temp:  [97.4 F (36.3 C)-98 F (36.7 C)] 98 F (36.7 C) (09/22 1300) Pulse Rate:  [53-72] 70 (09/22 1300) Resp:  [17-18] 18 (09/22 1300) BP: (139-174)/(88-96) 152/88 mmHg (09/22 1300) SpO2:  [93 %-99 %] 99 % (09/22 1300)  Intake/Output from previous day: 09/21 0701 - 09/22 0700 In: 50 [IV Piggyback:50] Out: 50 [Urine:50] Intake/Output this shift: Total I/O In: 263 [P.O.:260; I.V.:3] Out: 240 [Urine:240] Nutritional status: General  Past Medical History  Diagnosis Date  . Hypertension    Neurologic Exam:   Mental Status: Alert, oriented to place, thought content appropriate.  Speech fluent without evidence of aphasia.  Able to follow 3 step commands without difficulty. Cranial Nerves: II: Visual fields grossly normal tends to neglect right hand to DSS, pupils equal, round, reactive to light and accommodation III,IV, VI: ptosis not present, extra-ocular motions intact bilaterally V,VII: smile symmetric, facial light touch sensation normal bilaterally VIII: hearing normal bilaterally IX,X: gag reflex present XI: bilateral shoulder shrug XII: midline tongue extension Motor: Right : Upper extremity   5/5    Left:     Upper extremity   5/5  Lower extremity   5/5     Lower extremity   5/5 Tone and bulk:normal tone throughout; no atrophy noted Sensory: Pinprick and light touch intact throughout, bilaterally Deep Tendon Reflexes:  Right: Upper Extremity   Left: Upper extremity   biceps (C-5 to C-6) 2/4   biceps  (C-5 to C-6) 2/4 tricep (C7) 2/4    triceps (C7) 2/4 Brachioradialis (C6) 2/4  Brachioradialis (C6) 2/4  Lower Extremity Lower Extremity  quadriceps (L-2 to L-4) 2/4   quadriceps (L-2 to L-4) 2/4 Achilles (  S1) 0/4   Achilles (S1) 0/4  Plantars: Right: downgoing   Left: downgoing Cerebellar: normal finger-to-nose,  CV: pulses palpable throughout   Lab Results: Lab Results  Component Value Date/Time   CHOL 117 11/24/2012  2:27 AM   Lipid Panel  Recent Labs  11/24/12 0227  CHOL 117  TRIG 75  HDL 43  CHOLHDL 2.7  VLDL 15  LDLCALC 59    Studies/Results: X-ray Chest Pa And Lateral   11/23/2012   *RADIOLOGY REPORT*  Clinical Data: Subacute encephalopathy.  Stroke and hypertension.  CHEST - 2 VIEW  Comparison: None.  Findings: Heart size appears upper normal.  Thoracic aorta and hilar contours are normal.  Advanced emphysematous changes are present.  No focal airspace opacity, or pneumothorax.  The left costophrenic angle is partially excluded from the image.  No visible pleural effusion. No acute osseous abnormality.  IMPRESSION: Advanced emphysematous changes.  No definite acute findings.   Original Report Authenticated By: Britta Mccreedy, M.D.   Ct Head Wo Contrast  11/23/2012   CLINICAL DATA:  Altered mental status ; recent trauma  EXAM: CT HEAD WITHOUT CONTRAST  TECHNIQUE: Contiguous axial images were obtained from the base of the skull through the vertex without intravenous contrast. Study was obtained within 24 hr of patient's arrival at the emergency department.  COMPARISON:  September 11, 2002  FINDINGS: There is moderate diffuse atrophy. There is no demonstrable mass, hemorrhage, extra-axial fluid collection, or midline shift. There is evidence of an old infarct in the inferior posterior left cerebellum with encephalomalacia in this area. There is evidence of a prior infarct at the junction of the the right temporal, occipital, and parietal lobes. There is evidence of a prior infarct in  the medial right frontal lobe. There is widespread small vessel disease throughout the centra semiovale bilaterally. There is evidence of a prior small infarct involving portions of the anterior limb of the left internal capsule and left globus pallidum. There is evidence of a prior infarct in a portion of the head of the caudate nucleus on the right as well as the anterior right lentiform nucleus. There is evidence of prior small infarct in the anterior superior left thalamus. No convincing acute infarct is seen on this study.  Bony calvarium appears intact. Mastoid air cells are clear.  IMPRESSION: Atrophy with multiple prior infarcts and extensive small vessel disease. No acute appearing infarct is appreciable on this study. There is no hemorrhage or mass effect.   Electronically Signed   By: Bretta Bang   On: 11/23/2012 17:14    MEDICATIONS                                                                                                                        Scheduled: . amLODipine  10 mg Oral Daily  . aspirin EC  81 mg Oral Daily  . feeding supplement  237 mL Oral BID BM  . folic acid  1 mg Oral Daily  .  heparin  5,000 Units Subcutaneous Q8H  . multivitamin with minerals  1 tablet Oral Daily  . potassium chloride  30 mEq Oral BID  . sodium chloride  3 mL Intravenous Q12H  . sodium chloride  3 mL Intravenous Q12H  . thiamine (VITAMIN B1) IVPB  500 mg Intravenous TID   Followed by  . [START ON 11/28/2012] thiamine (VITAMIN B1) IVPB  250 mg Intravenous Q24H    ASSESSMENT/PLAN:                                                                                                           72 YO male with encephalopathy and likely underlying dementia which has not been formaly Dx. RPR (+) (ID following), Ammonia, B12, Folate, TSH normal.  Recommend 1) MRI brain if possible 2) PT/OT 3) likely will need SNF placement  No further recommendations.   Assessment and plan discussed with with  attending physician and they are in agreement.    Felicie Morn PA-C [facility] [phone]  11/25/2012, 3:04 PM

## 2012-11-25 NOTE — Progress Notes (Signed)
Family Medicine Teaching Service Daily Progress Note Intern Pager: [phone]  Patient name: Joshua Gonzales Medical record number: [id number] Date of birth: [birth year] Age: 72 y.o. 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 Also consider wernicke encephalopathy vs NPH with incontinence, dementia, and ataxia. Also consider vitamin deficiency vs infectious though less likely given afebrile and normal WBC. Tylenol and salicylate levels normal. UA and UDS negative, alcohol level and ammonia normal. - Dosed thiamine 100mg  IV daily - Pt will need a PCP on discharge - CIWA overnight high score 12, ativan x 1 yesterday - Continue to monitor though suspect much of the deficit may be permanent - Consulted neurology, appreciate their recommendations - PT recs: SNF. Awaiting OT recs.  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 Unknown if previously treated. - Appreciate ID consult and recommendations - ?LP, ?FTA-ABS  # Sinus arrhythmia, previous abnormal stress test - Prior NM study 2003 with small reversible perfusion defect anteroseptal portion of left ventricular apex, normal EF, ?mild hypokinesia. EKG last night uwaves concerning for prior inferior infarct, no ST elevation. Not MI picture, but cycling troponins. - trops negative x 2 - EKG yesterday sinus brady - Started aspirin 81 - Risk stratification labs (lipid panel with calculated ASCVD risk 26.2% - consider starting mod- or high-intensity statin though balance with age and dementia) - F/u telemetry for any atrial fibrillation; unlikely to anticoagulate demented patient with ataxia  # HTN - Likely chronic, uncontrolled, smoking hx. S/p clonidine in  ED - Continue hydralazine PRN SBP >180 or DBP>100. - Holding BB given brady - Started norvasc 2.5mg  daily yesterday - monitor closely  # Metabolic alkalosis - bicarb 36 on admission, no other electrolyte imbalance. Pt stable. - Resolved on recheck  # Hypokalemia to 3- Cr WNL, cause unknown other than poor diet/ little intake per niece - 3.2 this morning, repeat 30 mEq kdur x 2 today  FEN/GI: Regular diet PPx: SQ heparin  Disposition: Pending further evaluation for cause of encephalopathy, f/u PT/OT recs  Subjective:  Patient drowsy this morning, falls asleep between answers. Denies pain. Oriented to [facility] hospital, [place], but says year is [phone], cannot tell me who the president is.  Objective: Temp:  [97.4 F (36.3 C)-98.3 F (36.8 C)] 97.4 F (36.3 C) (09/22 0450) Pulse Rate:  [54-72] 61 (09/22 0450) Resp:  [17-18] 17 (09/22 0450) BP: (150-177)/(92-96) 174/93 mmHg (09/22 0450) SpO2:  [93 %-95 %] 95 % (09/22 0450) Physical Exam: General: NAD, sleeping between questions HEENT: PERRL Cardiovascular: RRR, normal heart sounds Respiratory: CTAB, normal effort Abdomen: Soft, nontender, nondistended Extremities: No LE edema or tenderness Neuro: Oriented x1. Spontaneous eye opening and extremity movement.  Laboratory:  Recent Labs Lab 11/23/12 1614 11/23/12 2255  WBC 5.4 6.1  HGB 15.7 14.5  HCT 43.8 41.2  PLT 209 200    Recent Labs Lab 11/23/12 1614 11/23/12 2255 11/24/12 0227  NA 141  --  135  K 3.5  --  3.0*  CL 98  --  97  CO2 36*  --  29  BUN 15  --  14  CREATININE 1.07 1.00 0.91  CALCIUM 9.5  --  9.0  PROT 8.1  --  6.8  BILITOT  0.4  --  0.3  ALKPHOS 66  --  59  ALT 6  --  6  AST 22  --  15  GLUCOSE 102*  --  91   Urinalysis: negative Lipid Panel     Component Value Date/Time   CHOL 117 11/24/2012 0227   TRIG 75 11/24/2012 0227   HDL 43 11/24/2012 0227   CHOLHDL 2.7 11/24/2012 0227   VLDL 15 11/24/2012 0227   LDLCALC 59 11/24/2012 0227    Troponin neg x 2 RPR titer: REACTIVE HIV: non-reactive Vit B12: 270 Folate: pending APAP level: <15.0 TSH: 0.910 A1c: 6.7 Ethanol 11mg /dL UDS: negative Salicylate level: 2.6  Imaging/Diagnostic Tests: Chest x-ray : emphysema  CT Head wo contrast: IMPRESSION:  Atrophy with multiple prior infarcts and extensive small vessel  disease. No acute appearing infarct is appreciable on this study.  There is no hemorrhage or mass effect.   Tawni Carnes, MD 11/25/2012, 6:55 AM PGY-1, [facility] Family Medicine FPTS Intern pager: [phone], text pages welcome

## 2012-11-25 NOTE — Progress Notes (Signed)
INITIAL NUTRITION ASSESSMENT  DOCUMENTATION CODES Per approved criteria  -Not Applicable   INTERVENTION: 1. Ensure Complete po BID, each supplement provides 350 kcal and 13 grams of protein.   NUTRITION DIAGNOSIS: Inadequate oral intake related to increased alcohol intake as evidenced by likely weight loss.   Goal: PO intake to meet >/=90% estimated nutrition needs.   Monitor:  PO intake, weight trends, labs   Reason for Assessment: Malnutrition Screening Tool  72 y.o. male  Admitting Dx: <principal problem not specified>  ASSESSMENT: Pt admitted with worsening confusion and falls. Pt with hx of etoh abuse and heavy smoking. 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  Pt unable to provide much hx. States he has had "a taste for anything" and might have lost some weight. Unsure of his usual weight, maybe about 140-150 lbs. Currently 152 lbs.   Nutrition Focused Physical Exam:  Subcutaneous Fat:  Orbital Region: mild wasting Upper Arm Region: not assessed  Thoracic and Lumbar Region: not assessed    Muscle:  Temple Region: moderate wasting Clavicle Bone Region: moderate wasting Clavicle and Acromion Bone Region: moderate wasting Scapular Bone Region: not assessed   Dorsal Hand: not assessed  Patellar Region: not assessed  Anterior Thigh Region: not assessed   Posterior Calf Region: not assessed    Edema: not assessed     Height: Ht Readings from Last 1 Encounters:  11/23/12 5\' 10"  (1.778 m)    Weight: Wt Readings from Last 1 Encounters:  11/23/12 152 lb 8 oz (69.174 kg)    Ideal Body Weight: 166 lbs   % Ideal Body Weight: 91%  Wt Readings from Last 10 Encounters:  11/23/12 152 lb 8 oz (69.174 kg)    Usual Body Weight: 140-150 lbs per pt   % Usual Body Weight: 100%  BMI:  Body mass index is 21.88 kg/(m^2). WNL   Estimated Nutritional Needs: Kcal: 1775-2000 Protein: 70-85 gm  Fluid: 1.8-2 L   Skin: intact   Diet Order:  General  EDUCATION NEEDS: -No education needs identified at this time   Intake/Output Summary (Last 24 hours) at 11/25/12 1023 Last data filed at 11/25/12 0900  Gross per 24 hour  Intake     70 ml  Output      0 ml  Net     70 ml    Last BM: PTA    Labs:   Recent Labs Lab 11/23/12 1614 11/23/12 2255 11/24/12 0227 11/25/12 0651  NA 141  --  135 134*  K 3.5  --  3.0* 3.2*  CL 98  --  97 99  CO2 36*  --  29 24  BUN 15  --  14 10  CREATININE 1.07 1.00 0.91 0.76  CALCIUM 9.5  --  9.0 9.4  GLUCOSE 102*  --  91 96    CBG (last 3)   Recent Labs  11/23/12 1620 11/23/12 1813  GLUCAP 164* 104*    Scheduled Meds: . amLODipine  10 mg Oral Daily  . aspirin EC  81 mg Oral Daily  . folic acid  1 mg Oral Daily  . heparin  5,000 Units Subcutaneous Q8H  . multivitamin with minerals  1 tablet Oral Daily  . potassium chloride  30 mEq Oral BID  . sodium chloride  3 mL Intravenous Q12H  . sodium chloride  3 mL Intravenous Q12H  . thiamine (VITAMIN B1) IVPB  500 mg Intravenous TID   Followed by  . [START ON 11/28/2012] thiamine (  VITAMIN B1) IVPB  250 mg Intravenous Q24H    Continuous Infusions:   Past Medical History  Diagnosis Date  . Hypertension     History reviewed. No pertinent past surgical history.  Isabell Jarvis RD, LDN Pager [phone] After Hours pager [phone]

## 2012-11-25 NOTE — Progress Notes (Signed)
Attending Addendum  I examined the patient and discussed the assessment and plan with Dr. Wight. I have reviewed the note and agree.    Ladeana Laplant, MD FAMILY MEDICINE TEACHING SERVICE    
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
## 2012-11-25 NOTE — Consult Note (Signed)
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    -- Check LP and  CSF-VDRL -- Consider MRI -- Check HIV RNA -- check urine gc/chlamydia  ?Encephalopathy- Wernickes Dementia HTN  Comment- Spoke with [facility]- he has no hx of being treated for Syphilis in [place]. To dx him with neurosyphilis, need to get CSF VDRL. His treatment will be based on his CSF studies. Acute HIV could present like this (HIV Ab -, but RNA +) but would be unusual in this age group.  He states he has been treated at the health dept before and that he used to live in [place]. I am not sure how to get records from there or that he is even being accurate. RPR can often be falsely+ in patients with CVA.     Thank you so much for this interesting consult,    Rica Koyanagi (pager) [phone] [email or web address]  Joshua Gonzales is an 72 y.o. male.  HPI: 72 yo with hx of HTN, ETOH abuse (? Last drink), who comes [facility] with 3 weeks of incontinence, confusion and falls. He is unable to give a complete hx.  He was found to be afebrile in ED, hypertensive, and had normal WBC. 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 No acute appearing infarct is appreciable on this study. There is no hemorrhage or mass effect. Echo 9/22- unremarkable, Normal TSH, tox screen  Past Medical History  Diagnosis Date  . Hypertension     History reviewed. No pertinent past surgical history.   No Known Allergies  Medications:  Scheduled: . amLODipine  10 mg Oral Daily  . aspirin EC  81 mg Oral Daily  . feeding supplement  237 mL Oral BID BM  . folic acid  1 mg Oral Daily  . heparin  5,000 Units Subcutaneous Q8H  .  multivitamin with minerals  1 tablet Oral Daily  . potassium chloride  30 mEq Oral BID  . sodium chloride  3 mL Intravenous Q12H  . sodium chloride  3 mL Intravenous Q12H  . thiamine (VITAMIN B1) IVPB  500 mg Intravenous TID   Followed by  . [START ON 11/28/2012] thiamine (VITAMIN B1) IVPB  250 mg Intravenous Q24H    Total days of antibiotics: 0          Social History:  reports that he has been smoking.  He does not have any smokeless tobacco history on file. He reports that he does not drink alcohol. His drug history is not on file.  History reviewed. No pertinent family history.  denies any nausea, vomiting, fever, chills, abdo pain or change in urine/ bowel movements.  Blood pressure 152/88, pulse 70, temperature 98 F (36.7 C), temperature source Oral, resp. rate 18, height 5\' 10"  (1.778 m), weight 69.174 kg (152 lb 8 oz), SpO2 99.00%. General appearance: cooperative and disoriented Throat: lips, mucosa, and tongue normal; gums normal. Multiple missing teeth and fractured teeth. No oral ulcers Neck: no adenopathy, no carotid bruit, no JVD, supple, symmetrical, trachea midline and thyroid not enlarged, symmetric, no tenderness/mass/nodules Lungs: clear to auscultation bilaterally Heart: regular rate and rhythm, S1, S2 normal, no murmur, click, rub or gallop Abdomen: soft, non-tender; bowel sounds normal; no  masses,  no organomegaly No genital lesions CNS exam- CN II-XII grossly intact Mental status- oriented to place and person, not time.  Unable to repeat 3 words after 2 minutes, unable to spell WORLD backwards Motor- 5/5 strength upper and lower limbs  Results for orders placed during the hospital encounter of 11/23/12 (from the past 48 hour(s))  URINALYSIS, ROUTINE W REFLEX MICROSCOPIC     Status: None   Collection Time    11/23/12  5:12 PM      Result Value Range   Color, Urine YELLOW  YELLOW   APPearance CLEAR  CLEAR   Specific Gravity, Urine 1.016  1.005 - 1.030   pH  5.5  5.0 - 8.0   Glucose, UA NEGATIVE  NEGATIVE mg/dL   Hgb urine dipstick NEGATIVE  NEGATIVE   Bilirubin Urine NEGATIVE  NEGATIVE   Ketones, ur NEGATIVE  NEGATIVE mg/dL   Protein, ur NEGATIVE  NEGATIVE mg/dL   Urobilinogen, UA 1.0  0.0 - 1.0 mg/dL   Nitrite NEGATIVE  NEGATIVE   Leukocytes, UA NEGATIVE  NEGATIVE   Comment: MICROSCOPIC NOT DONE ON URINES WITH NEGATIVE PROTEIN, BLOOD, LEUKOCYTES, NITRITE, OR GLUCOSE <1000 mg/dL.  URINE RAPID DRUG SCREEN (HOSP PERFORMED)     Status: None   Collection Time    11/23/12  5:12 PM      Result Value Range   Opiates NONE DETECTED  NONE DETECTED   Cocaine NONE DETECTED  NONE DETECTED   Benzodiazepines NONE DETECTED  NONE DETECTED   Amphetamines NONE DETECTED  NONE DETECTED   Tetrahydrocannabinol NONE DETECTED  NONE DETECTED   Barbiturates NONE DETECTED  NONE DETECTED   Comment:            DRUG SCREEN FOR MEDICAL PURPOSES     ONLY.  IF CONFIRMATION IS NEEDED     FOR ANY PURPOSE, NOTIFY LAB     WITHIN 5 DAYS.                LOWEST DETECTABLE LIMITS     FOR URINE DRUG SCREEN     Drug Class       Cutoff (ng/mL)     Amphetamine      1000     Barbiturate      200     Benzodiazepine   200     Tricyclics       300     Opiates          300     Cocaine          300     THC              50  GLUCOSE, CAPILLARY     Status: Abnormal   Collection Time    11/23/12  6:13 PM      Result Value Range   Glucose-Capillary 104 (*) 70 - 99 mg/dL  ETHANOL     Status: None   Collection Time    11/23/12  6:54 PM      Result Value Range   Alcohol, Ethyl (B) <11  0 - 11 mg/dL   Comment:            LOWEST DETECTABLE LIMIT FOR     SERUM ALCOHOL IS 11 mg/dL     FOR MEDICAL PURPOSES ONLY  CBC     Status: None   Collection Time    11/23/12 10:55 PM      Result Value Range   WBC 6.1  4.0 -  10.5 K/uL   RBC 4.53  4.22 - 5.81 MIL/uL   Hemoglobin 14.5  13.0 - 17.0 g/dL   HCT 60.4  54.0 - 98.1 %   MCV 90.9  78.0 - 100.0 fL   MCH 32.0  26.0 - 34.0 pg   MCHC  35.2  30.0 - 36.0 g/dL   RDW 19.1  47.8 - 29.5 %   Platelets 200  150 - 400 K/uL  CREATININE, SERUM     Status: Abnormal   Collection Time    11/23/12 10:55 PM      Result Value Range   Creatinine, Ser 1.00  0.50 - 1.35 mg/dL   GFR calc non Af Amer 75 (*) >90 mL/min   GFR calc Af Amer 87 (*) >90 mL/min   Comment: (NOTE)     The eGFR has been calculated using the CKD EPI equation.     This calculation has not been validated in all clinical situations.     eGFR's persistently <90 mL/min signify possible Chronic Kidney     Disease.  AMMONIA     Status: None   Collection Time    11/23/12 10:55 PM      Result Value Range   Ammonia 31  11 - 60 umol/L  VITAMIN B12     Status: None   Collection Time    11/23/12 10:55 PM      Result Value Range   Vitamin B-12 270  211 - 911 pg/mL   Comment: Performed at [facility]  FOLATE RBC     Status: None   Collection Time    11/23/12 10:55 PM      Result Value Range   RBC Folate 372  >=366 ng/mL   Comment: Reference range not established for pediatric patients.     Performed at [facility]  TROPONIN I     Status: None   Collection Time    11/23/12 10:55 PM      Result Value Range   Troponin I <0.30  <0.30 ng/mL   Comment:            Due to the release kinetics of cTnI,     a negative result within the first hours     of the onset of symptoms does not rule out     myocardial infarction with certainty.     If myocardial infarction is still suspected,     repeat the test at appropriate intervals.  ACETAMINOPHEN LEVEL     Status: None   Collection Time    11/23/12 10:55 PM      Result Value Range   Acetaminophen (Tylenol), Serum <15.0  10 - 30 ug/mL   Comment:            THERAPEUTIC CONCENTRATIONS VARY     SIGNIFICANTLY. A RANGE OF 10-30     ug/mL MAY BE AN EFFECTIVE     CONCENTRATION FOR MANY PATIENTS.     HOWEVER, SOME ARE BEST TREATED     AT CONCENTRATIONS OUTSIDE THIS     RANGE.     ACETAMINOPHEN CONCENTRATIONS      >150 ug/mL AT 4 HOURS AFTER     INGESTION AND >50 ug/mL AT 12     HOURS AFTER INGESTION ARE     OFTEN ASSOCIATED WITH TOXIC     REACTIONS.  SALICYLATE LEVEL     Status: Abnormal   Collection Time    11/23/12 10:55 PM      Result  Value Range   Salicylate Lvl 2.6 (*) 2.8 - 20.0 mg/dL  TSH     Status: None   Collection Time    11/23/12 10:55 PM      Result Value Range   TSH 0.910  0.350 - 4.500 uIU/mL   Comment: Performed at [facility]  HEMOGLOBIN A1C     Status: Abnormal   Collection Time    11/23/12 10:55 PM      Result Value Range   Hemoglobin A1C 6.7 (*) <5.7 %   Comment: (NOTE)                                                                               According to the ADA Clinical Practice Recommendations for 2011, when     HbA1c is used as a screening test:      >=6.5%   Diagnostic of Diabetes Mellitus               (if abnormal result is confirmed)     5.7-6.4%   Increased risk of developing Diabetes Mellitus     References:Diagnosis and Classification of Diabetes Mellitus,Diabetes     Care,2011,34(Suppl 1):S62-S69 and Standards of Medical Care in             Diabetes - 2011,Diabetes Care,2011,34 (Suppl 1):S11-S61.   Mean Plasma Glucose 146 (*) <117 mg/dL   Comment: Performed at [facility]  COMPREHENSIVE METABOLIC PANEL     Status: Abnormal   Collection Time    11/24/12  2:27 AM      Result Value Range   Sodium 135  135 - 145 mEq/L   Potassium 3.0 (*) 3.5 - 5.1 mEq/L   Chloride 97  96 - 112 mEq/L   CO2 29  19 - 32 mEq/L   Glucose, Bld 91  70 - 99 mg/dL   BUN 14  6 - 23 mg/dL   Creatinine, Ser 2.95  0.50 - 1.35 mg/dL   Calcium 9.0  8.4 - 62.1 mg/dL   Total Protein 6.8  6.0 - 8.3 g/dL   Albumin 3.2 (*) 3.5 - 5.2 g/dL   AST 15  0 - 37 U/L   ALT 6  0 - 53 U/L   Alkaline Phosphatase 59  39 - 117 U/L   Total Bilirubin 0.3  0.3 - 1.2 mg/dL   GFR calc non Af Amer 84 (*) >90 mL/min   GFR calc Af Amer >90  >90 mL/min   Comment: (NOTE)      The eGFR has been calculated using the CKD EPI equation.     This calculation has not been validated in all clinical situations.     eGFR's persistently <90 mL/min signify possible Chronic Kidney     Disease.  TROPONIN I     Status: None   Collection Time    11/24/12  2:27 AM      Result Value Range   Troponin I <0.30  <0.30 ng/mL   Comment:            Due to the release kinetics of cTnI,     a negative result within the first hours     of the onset  of symptoms does not rule out     myocardial infarction with certainty.     If myocardial infarction is still suspected,     repeat the test at appropriate intervals.  LIPID PANEL     Status: None   Collection Time    11/24/12  2:27 AM      Result Value Range   Cholesterol 117  0 - 200 mg/dL   Triglycerides 75  <161 mg/dL   HDL 43  >09 mg/dL   Total CHOL/HDL Ratio 2.7     VLDL 15  0 - 40 mg/dL   LDL Cholesterol 59  0 - 99 mg/dL   Comment:            Total Cholesterol/HDL:CHD Risk     Coronary Heart Disease Risk Table                         Men   Women      1/2 Average Risk   3.4   3.3      Average Risk       5.0   4.4      2 X Average Risk   9.6   7.1      3 X Average Risk  23.4   11.0                Use the calculated Patient Ratio     above and the CHD Risk Table     to determine the patient's CHD Risk.                ATP III CLASSIFICATION (LDL):      <100     mg/dL   Optimal      604-540  mg/dL   Near or Above                        Optimal      130-159  mg/dL   Borderline      981-191  mg/dL   High      >478     mg/dL   Very High  HIV ANTIBODY (ROUTINE TESTING)     Status: None   Collection Time    11/24/12  9:55 AM      Result Value Range   HIV NON REACTIVE  NON REACTIVE   Comment: Performed at [facility]  RPR     Status: Abnormal   Collection Time    11/24/12  9:55 AM      Result Value Range   RPR Reactive (*) NON REACTIVE   Comment: *CONFIRMATORY TESTING TO FOLLOW*     Performed at [facility]  RPR TITER     Status: Abnormal   Collection Time    11/24/12  9:55 AM      Result Value Range   RPR Titer 1:16 (*) NON REACTIVE   Comment: Performed at [facility]  T.PALLIDUM AB, IGG     Status: Abnormal   Collection Time    11/24/12  9:55 AM      Result Value Range   T pallidum Antibodies (TP-PA) >8.00 (*) <0.90 S/CO   Comment: (NOTE)     <0.90 S/CO          NON REACTIVE     0.90-1.00 S/CO      INDETERMINATE     >1.00 S/CO  REACTIVE     The following results were obtained with the IMMULITE 2000 Syphilis     Screen chemiluminescent EIA. Results obtained from other     manufacturers' assay methods may not be used interchangeably.     Performed at [facility]  BASIC METABOLIC PANEL     Status: Abnormal   Collection Time    11/25/12  6:51 AM      Result Value Range   Sodium 134 (*) 135 - 145 mEq/L   Potassium 3.2 (*) 3.5 - 5.1 mEq/L   Chloride 99  96 - 112 mEq/L   CO2 24  19 - 32 mEq/L   Glucose, Bld 96  70 - 99 mg/dL   BUN 10  6 - 23 mg/dL   Creatinine, Ser 7.82  0.50 - 1.35 mg/dL   Calcium 9.4  8.4 - 95.6 mg/dL   GFR calc non Af Amer >90  >90 mL/min   GFR calc Af Amer >90  >90 mL/min   Comment: (NOTE)     The eGFR has been calculated using the CKD EPI equation.     This calculation has not been validated in all clinical situations.     eGFR's persistently <90 mL/min signify possible Chronic Kidney     Disease.   No results found for this basename: sdes, specrequest, cult, reptstatus   X-ray Chest Pa And Lateral   11/23/2012   *RADIOLOGY REPORT*  Clinical Data: Subacute encephalopathy.  Stroke and hypertension.  CHEST - 2 VIEW  Comparison: None.  Findings: Heart size appears upper normal.  Thoracic aorta and hilar contours are normal.  Advanced emphysematous changes are present.  No focal airspace opacity, or pneumothorax.  The left costophrenic angle is partially excluded from the image.  No visible pleural effusion. No acute osseous  abnormality.  IMPRESSION: Advanced emphysematous changes.  No definite acute findings.   Original Report Authenticated By: Britta Mccreedy, M.D.   Ct Head Wo Contrast  11/23/2012   CLINICAL DATA:  Altered mental status ; recent trauma  EXAM: CT HEAD WITHOUT CONTRAST  TECHNIQUE: Contiguous axial images were obtained from the base of the skull through the vertex without intravenous contrast. Study was obtained within 24 hr of patient's arrival at the emergency department.  COMPARISON:  September 11, 2002  FINDINGS: There is moderate diffuse atrophy. There is no demonstrable mass, hemorrhage, extra-axial fluid collection, or midline shift. There is evidence of an old infarct in the inferior posterior left cerebellum with encephalomalacia in this area. There is evidence of a prior infarct at the junction of the the right temporal, occipital, and parietal lobes. There is evidence of a prior infarct in the medial right frontal lobe. There is widespread small vessel disease throughout the centra semiovale bilaterally. There is evidence of a prior small infarct involving portions of the anterior limb of the left internal capsule and left globus pallidum. There is evidence of a prior infarct in a portion of the head of the caudate nucleus on the right as well as the anterior right lentiform nucleus. There is evidence of prior small infarct in the anterior superior left thalamus. No convincing acute infarct is seen on this study.  Bony calvarium appears intact. Mastoid air cells are clear.  IMPRESSION: Atrophy with multiple prior infarcts and extensive small vessel disease. No acute appearing infarct is appreciable on this study. There is no hemorrhage or mass effect.   Electronically Signed   By: Bretta Bang   On: 11/23/2012 17:14  No results found for this or any previous visit (from the past 240 hour(s)).    11/25/2012, 4:31 PM     LOS: 2 days

## 2012-11-25 NOTE — Progress Notes (Signed)
  Echocardiogram 2D Echocardiogram has been performed.  Cathie Beams 11/25/2012, 3:06 PM

## 2012-11-25 NOTE — Progress Notes (Signed)
PT Cancellation Note  Patient Details Name: Joshua Gonzales MRN: [id number] DOB: November 15, 1940   Cancelled Treatment:    Reason Eval/Treat Not Completed: Patient at procedure or test/unavailable    Verdell Face, PTA [phone] 11/25/2012

## 2012-11-26 DIAGNOSIS — F1021 Alcohol dependence, in remission: Secondary | ICD-10-CM | POA: Diagnosis present

## 2012-11-26 DIAGNOSIS — G9340 Encephalopathy, unspecified: Secondary | ICD-10-CM | POA: Diagnosis present

## 2012-11-26 DIAGNOSIS — E876 Hypokalemia: Secondary | ICD-10-CM | POA: Diagnosis not present

## 2012-11-26 LAB — BASIC METABOLIC PANEL
BUN: 9 mg/dL (ref 6–23)
Calcium: 9.5 mg/dL (ref 8.4–10.5)
Creatinine, Ser: 0.96 mg/dL (ref 0.50–1.35)
GFR calc Af Amer: >90 mL/min (ref >90)
GFR calc non Af Amer: 83 mL/min — ABNORMAL LOW (ref >90)
Potassium: 3.4 mEq/L — ABNORMAL LOW (ref 3.5–5.1)

## 2012-11-26 LAB — HIV-1 RNA ULTRAQUANT REFLEX TO GENTYP+: HIV 1 RNA Quant: <20 copies/mL (ref <20)

## 2012-11-26 MED ORDER — NICOTINE 7 MG/24HR TD PT24
7.0000 mg | MEDICATED_PATCH | Freq: Every day | TRANSDERMAL | Status: DC
  Administered 2012-11-26 – 2012-11-29 (×4): 7 mg via TRANSDERMAL
  Filled 2012-11-26 (×4): qty 1

## 2012-11-26 NOTE — Progress Notes (Signed)
Physical Therapy Treatment Patient Details Name: Joshua Gonzales MRN: [id number] DOB: 11/07/1940 Today's Date: 11/26/2012 Time: 8413-2440 PT Time Calculation (min): 15 min  PT Assessment / Plan / Recommendation  History of Present Illness Pt is a 72 yo male admitted for weakness, increased falls, and decline in function.  Pt being worked up for vascular demenia vs. alcoholic encephalopathy.  Pt with no new CVA on CT scan but has had many past CVAs and changes on CT.  Pt with "plate" in his head from old head injury therefore MRI currently not performed.   PT Comments   Pt able to state he was at [facility] and his birth date. Unable to state why he was here or recall working with therapy before today. Mobility today continues to indicted pt is at increased risk of falling if not assisted.   Follow Up Recommendations  SNF;Supervision/Assistance - 24 hour     Equipment Recommendations  None recommended by PT       Frequency Min 3X/week   Progress towards PT Goals Progress towards PT goals: Progressing toward goals  Plan Current plan remains appropriate    Precautions / Restrictions Precautions Precautions: Fall Restrictions Weight Bearing Restrictions: No       Mobility  Bed Mobility Bed Mobility: Supine to Sit;Sitting - Scoot to Edge of Bed Supine to Sit: 4: Min assist;HOB flat;With rails Sitting - Scoot to Edge of Bed: 4: Min assist;4: Min guard Sit to Supine: 4: Min assist;HOB flat Details for Bed Mobility Assistance: pt attempting to get out of bed by climbing over bed rail on arrival to room. rail lowered and pt assisted to edge of bed safely. Transfers Sit to Stand: 3: Mod assist;From bed;With upper extremity assist Stand to Sit: 3: Mod assist;To bed;With upper extremity assist Details for Transfer Assistance: cues for hand placement and for ant weight shifting with standing. assist to position body of mass over BOS with standing due to posterior lean. assist to control descent  with sitting back down to bed. Ambulation/Gait Ambulation/Gait Assistance: 4: Min assist Ambulation Distance (Feet): 40 Feet Assistive device: 1 person hand held assist Ambulation/Gait Assistance Details: pt with staggering gait today and decr'd stabiltiy therefore limited gait distance. Gait Pattern: Step-through pattern;Narrow base of support;Trunk flexed;Scissoring;Decreased stride length    PT Treatment Interventions:     PT Goals (current goals can now be found in the care plan section) Acute Rehab PT Goals PT Goal Formulation: Patient unable to participate in goal setting Time For Goal Achievement: 12/08/12 Potential to Achieve Goals: Good  Visit Information  Last PT Received On: 11/26/12 History of Present Illness: Pt is a 72 yo male admitted for weakness, increased falls, and decline in function.  Pt being worked up for vascular demenia vs. alcoholic encephalopathy.  Pt with no new CVA on CT scan but has had many past CVAs and changes on CT.  Pt with "plate" in his head from old head injury therefore MRI currently not performed.       Cognition  Cognition Arousal/Alertness: Awake/alert Behavior During Therapy: Impulsive Overall Cognitive Status: No family/caregiver present to determine baseline cognitive functioning Area of Impairment: Orientation;Attention;Memory;Following commands;Safety/judgement;Awareness;Problem solving Orientation Level: Disoriented to;Time;Situation Current Attention Level: Focused Memory: Decreased recall of precautions;Decreased short-term memory Following Commands: Follows one step commands consistently;Follows multi-step commands inconsistently Safety/Judgement: Decreased awareness of safety;Decreased awareness of deficits Awareness: Intellectual Problem Solving: Slow processing;Requires verbal cues;Requires tactile cues       End of Session PT - End of Session  Equipment Utilized During Treatment: Gait belt Activity Tolerance: Patient  tolerated treatment well Patient left: in chair;with call bell/phone within reach;with bed alarm set Nurse Communication: Mobility status   GP     Sallyanne Kuster 11/26/2012, 4:23 PM  Sallyanne Kuster, PTA Office- [phone]

## 2012-11-26 NOTE — Progress Notes (Signed)
Family Medicine Teaching Service Daily Progress Note Intern Pager: [phone]  Patient name: Joshua Gonzales Medical record number: [id number] Date of birth: 09/27/1940 Age: 72 y.o. Gender: male  Primary Care Provider: Pcp Not In System Consultants: Neuro Code Status: Full, need to address  Pt Overview and Major Events to Date:  9/21 Admitted for confusion, overnight pulled out IV  Assessment and Plan: 72 y.o M presenting with AMS X3 weeks, CT with no acute findings other than multiple old infarcts and extensive microvascular dx. 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 Also consider wernicke encephalopathy vs NPH with incontinence, dementia, and ataxia. Also consider vitamin deficiency vs infectious though less likely given afebrile and normal WBC. Tylenol and salicylate levels normal. UA and UDS negative, alcohol level and ammonia normal. Still continues to be confused this am Plan - Dosed thiamine 100mg  IV daily - Pt will need a PCP on discharge - CIWA 6,9, ativan x 1 yesterday - Continue to monitor though suspect much of the deficit may be permanent - Consulted neurology, "consider MRI brain" appreciate their recommendations - PT recs: SNF. Awaiting OT recs.  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 Per ID spoke with [place] [facility] and has no hx of being treated for syphilis here (but previously lived in [place]) to dx neurosyphilis would need CSF vdrl.  Plan - Appreciate ID consult and recommendations "LP/VDRL, MRI, HIV RNA, urine gcct" -HIV quant in process  # Sinus arrhythmia, previous abnormal stress test - Prior NM study 2003 with small reversible perfusion defect anteroseptal portion of left ventricular apex, normal EF, ?mild hypokinesia. Tele last night with sinus arrhythmia (likely multifocal but without tach)  Plan - trops negative - Started  aspirin 81 - Risk stratification labs (lipid panel with calculated ASCVD risk 26.2% - consider starting mod- or high-intensity statin though balance with age and dementia) - F/u telemetry for any atrial fibrillation; unlikely to anticoagulate demented patient with ataxia  # HTN - Likely chronic, uncontrolled, smoking hx. S/p clonidine in ED. Will likely need to add meds for better control 130s-160s yesterday - Continue hydralazine PRN SBP >180 or DBP>100. - Holding BB given brady - norvasc 10  # Metabolic alkalosis - bicarb 36 on admission, no other electrolyte imbalance. Pt stable.Resolved on recheck Plan -BMET prn  # Hypokalemia to 3 upon admission Cr WNL, cause unknown other than poor diet/ little intake per niece repleted with 30 kdur X2 yesterday PLan -BMET today, likely need to replete again  FEN/GI: Regular diet PPx: SQ heparin  Disposition: Pending further evaluation for cause of encephalopathy, f/u PT/OT recs  Subjective:  Sleepy and cold this am (per pt is always cold) believed himself to be in a hotel room and that it was 1999  Objective: Temp:  [98 F (36.7 C)-98.3 F (36.8 C)] 98 F (36.7 C) (09/23 0805) Pulse Rate:  [53-75] 75 (09/23 0805) Resp:  [17-18] 18 (09/23 0805) BP: (139-162)/(84-97) 150/84 mmHg (09/23 0805) SpO2:  [93 %-100 %] 94 % (09/23 0805) Physical Exam: General: NAD, sleeping between questions HEENT: PERRL Cardiovascular: RRR, normal heart sounds Respiratory: CTAB, normal effort Abdomen: Soft, nontender, nondistended Extremities: No LE edema or tenderness Neuro: Oriented x1. Spontaneous eye opening and extremity movement.  Laboratory:  Recent Labs Lab 11/23/12 1614 11/23/12 2255  WBC 5.4 6.1  HGB 15.7 14.5  HCT 43.8 41.2  PLT 209 200    Recent Labs Lab 11/23/12 1614 11/23/12 2255 11/24/12 0227 11/25/12 0651  NA 141  --  135 134*  K 3.5  --  3.0* 3.2*  CL 98  --  97 99  CO2 36*  --  29 24  BUN 15  --  14 10  CREATININE 1.07  1.00 0.91 0.76  CALCIUM 9.5  --  9.0 9.4  PROT 8.1  --  6.8  --   BILITOT 0.4  --  0.3  --   ALKPHOS 66  --  59  --   ALT 6  --  6  --   AST 22  --  15  --   GLUCOSE 102*  --  91 96   Urinalysis: negative Lipid Panel     Component Value Date/Time   CHOL 117 11/24/2012 0227   TRIG 75 11/24/2012 0227   HDL 43 11/24/2012 0227   CHOLHDL 2.7 11/24/2012 0227   VLDL 15 11/24/2012 0227   LDLCALC 59 11/24/2012 0227   Troponin neg x 2 RPR titer: REACTIVE HIV: non-reactive Vit B12: 270 Folate: pending APAP level: <15.0 TSH: 0.910 A1c: 6.7 Ethanol 11mg /dL UDS: negative Salicylate level: 2.6  Imaging/Diagnostic Tests: Chest x-ray : emphysema  CT Head wo contrast: IMPRESSION:  Atrophy with multiple prior infarcts and extensive small vessel  disease. No acute appearing infarct is appreciable on this study.  There is no hemorrhage or mass effect.   Anselm Lis, MD 11/26/2012, 9:28 AM PGY-1, [facility] Family Medicine FPTS Intern pager: [phone], text pages welcome

## 2012-11-26 NOTE — Progress Notes (Signed)
   INFECTIOUS DISEASE PROGRESS NOTE  ID: Joshua Gonzales is a 72 y.o. male with  Active Problems:   * No active hospital problems. *  Subjective: Still disoriented to time , thinks its Thursday or Friday 1999.  Without complaints wants to know when he can get out of here.  Abtx:  Anti-infectives   None      Medications:  Scheduled: . amLODipine  10 mg Oral Daily  . aspirin EC  81 mg Oral Daily  . feeding supplement  237 mL Oral BID BM  . folic acid  1 mg Oral Daily  . heparin  5,000 Units Subcutaneous Q8H  . multivitamin with minerals  1 tablet Oral Daily  . sodium chloride  3 mL Intravenous Q12H  . sodium chloride  3 mL Intravenous Q12H  . thiamine (VITAMIN B1) IVPB  500 mg Intravenous TID   Followed by  . [START ON 11/28/2012] thiamine (VITAMIN B1) IVPB  250 mg Intravenous Q24H    Objective: Vital signs in last 24 hours: Temp:  [98 F (36.7 C)-98.3 F (36.8 C)] 98 F (36.7 C) (09/23 0805) Pulse Rate:  [68-75] 75 (09/23 0805) Resp:  [17-18] 18 (09/23 0805) BP: (150-162)/(84-97) 150/84 mmHg (09/23 0805) SpO2:  [93 %-100 %] 94 % (09/23 0805)   General appearance: distracted and no distress Resp: clear to auscultation bilaterally Cardio: regular rate and rhythm, S1, S2 normal, no murmur, click, rub or gallop GI: soft, non-tender; bowel sounds normal; no masses,  no organomegaly Oriented to person and place, does not answer all questions CNII- CNXII grossly intact Motor- 5/5 strength arms and legs  Lab Results  Recent Labs  11/23/12 1614 11/23/12 2255 11/24/12 0227 11/25/12 0651  WBC 5.4 6.1  --   --   HGB 15.7 14.5  --   --   HCT 43.8 41.2  --   --   NA 141  --  135 134*  K 3.5  --  3.0* 3.2*  CL 98  --  97 99  CO2 36*  --  29 24  BUN 15  --  14 10  CREATININE 1.07 1.00 0.91 0.76   Liver Panel  Recent Labs  11/23/12 1614 11/24/12 0227  PROT 8.1 6.8  ALBUMIN 3.9 3.2*  AST 22 15  ALT 6 6  ALKPHOS 66 59  BILITOT 0.4 0.3   Sedimentation  Rate No results found for this basename: ESRSEDRATE,  in the last 72 hours C-Reactive Protein No results found for this basename: CRP,  in the last 72 hours  Microbiology: No results found for this or any previous visit (from the past 240 hour(s)).  Studies/Results: No results found.   Assessment/Plan: Possible Neurosyphilis-  awaiting LP-CSF VDRL prior to recommending therapy           Rica Koyanagi Infectious Diseases (pager) [phone] [email or web address] 11/26/2012, 11:40 AM  LOS: 3 days

## 2012-11-26 NOTE — Progress Notes (Signed)
Attending Addendum  I examined the patient and discussed the assessment and plan with Dr. Michail Jewels. I have reviewed the note and agree. The patient's mental status is improved this AM compared to yesterday afternoon. He is oriented to person and place [facility]), time 1999 at the time of my evaluation. I appreciated ID recommendations regarding further work up. We will proceed with MRI. I will discuss LP in more detail with the patient prior to obtaining this.     Dessa Phi, MD FAMILY MEDICINE TEACHING SERVICE

## 2012-11-26 NOTE — Progress Notes (Addendum)
NEURO HOSPITALIST PROGRESS NOTE   SUBJECTIVE:                                                                                                                        Patient has no complaints, resting comfortably in his bed. Follows commands.   OBJECTIVE:                                                                                                                           Vital signs in last 24 hours: Temp:  [98 F (36.7 C)-98.3 F (36.8 C)] 98 F (36.7 C) (09/23 0805) Pulse Rate:  [53-75] 75 (09/23 0805) Resp:  [17-18] 18 (09/23 0805) BP: (139-162)/(84-97) 150/84 mmHg (09/23 0805) SpO2:  [93 %-100 %] 94 % (09/23 0805)  Intake/Output from previous day: 09/22 0701 - 09/23 0700 In: 463 [P.O.:260; I.V.:3; IV Piggyback:200] Out: 340 [Urine:340] Intake/Output this shift:   Nutritional status: General  Past Medical History  Diagnosis Date  . Hypertension      Neurologic Exam:  Mental Status: Alert, oriented to hospital but not year or month, thought content appropriate.  Speech fluent without evidence of aphasia.  Able to follow 3 step commands without difficulty. Cranial Nerves: II:  Visual fields grossly normal, pupils equal, round, reactive to light and accommodation III,IV, VI: ptosis not present, extra-ocular motions intact bilaterally V,VII: smile symmetric, facial light touch sensation normal bilaterally VIII: hearing normal bilaterally IX,X: gag reflex present XI: bilateral shoulder shrug XII: midline tongue extension Motor: Right : Upper extremity   5/5    Left:     Upper extremity   5/5  Lower extremity   5/5     Lower extremity   5/5 Tone and bulk:normal tone throughout; no atrophy noted Sensory: Pinprick and light touch intact throughout, bilaterally Deep Tendon Reflexes:  Right: Upper Extremity   Left: Upper extremity   biceps (C-5 to C-6) 2/4   biceps (C-5 to C-6) 2/4 tricep (C7) 2/4    triceps (C7)  2/4 Brachioradialis (C6) 2/4  Brachioradialis (C6) 2/4  Lower Extremity Lower Extremity  quadriceps (L-2 to L-4) 2/4   quadriceps (L-2 to L-4) 2/4 Achilles (S1) 0/4   Achilles (S1) 0/4  Plantars: Right: downgoing  Left: downgoing   Lab Results: Lab Results  Component Value Date/Time   CHOL 117 11/24/2012  2:27 AM   Lipid Panel  Recent Labs  11/24/12 0227  CHOL 117  TRIG 75  HDL 43  CHOLHDL 2.7  VLDL 15  LDLCALC 59    Studies/Results: No results found.  MEDICATIONS                                                                                                                        Scheduled: . amLODipine  10 mg Oral Daily  . aspirin EC  81 mg Oral Daily  . feeding supplement  237 mL Oral BID BM  . folic acid  1 mg Oral Daily  . heparin  5,000 Units Subcutaneous Q8H  . multivitamin with minerals  1 tablet Oral Daily  . sodium chloride  3 mL Intravenous Q12H  . sodium chloride  3 mL Intravenous Q12H  . thiamine (VITAMIN B1) IVPB  500 mg Intravenous TID   Followed by  . [START ON 11/28/2012] thiamine (VITAMIN B1) IVPB  250 mg Intravenous Q24H    ASSESSMENT/PLAN:                                                                                                             72 YO with encephalopathy and positive RPR.  ID following for possible neurosyphilis. MRI of head would be helpful if possible. Exam remains non-focal other than confusion.   Will S/O at this time.   Assessment and plan discussed with with attending physician and they are in agreement.    Felicie Morn PA-C [facility] [phone]  11/26/2012, 10:00 AM

## 2012-11-26 NOTE — Progress Notes (Signed)
Speech Language Pathology Dysphagia Treatment Patient Details Name: Joshua Gonzales MRN: [id number] DOB: Jul 03, 1940 Today's Date: 11/26/2012 Time: 4098-1191 SLP Time Calculation (min): 11 min  Assessment / Plan / Recommendation Clinical Impression  F/u for dysphagia:  Pt, although still confused, has improved MS today such that he is able to sustain attention, safely masticate and swallow solids and thin liquids.  Recommend continuing regular diet, thin liquids.  Swallowing issues resolved.  No SLP f/u is warranted.    Be certain pt is adequately alert to eat if leaving him alone with his meal tray.    Diet Recommendation  Continue with Current Diet: Regular;Thin liquid    SLP Plan All goals met   Pertinent Vitals/Pain No pain   Swallowing Goals  SLP Swallowing Goals Patient will utilize recommended strategies during swallow to increase swallowing safety with: Supervision/safety Swallow Study Goal #2 - Progress: Met  General Temperature Spikes Noted: No Respiratory Status: Room air Behavior/Cognition: Alert;Cooperative Oral Cavity - Dentition: Missing dentition Patient Positioning: Upright in bed  Oral Cavity - Oral Hygiene     Dysphagia Treatment Treatment focused on: Skilled observation of diet tolerance Treatment Methods/Modalities: Skilled observation Patient observed directly with PO's: Yes Type of PO's observed: Regular;Thin liquids Feeding: Able to feed self Liquids provided via: Cup Type of cueing:  (none) Amount of cueing:  (none)   GO Functional Assessment Tool Used: Clinical judgement Functional Limitations: Swallowing Swallow Current Status (Y7829): 0 percent impaired, limited or restricted Swallow Goal Status (F6213): 0 percent impaired, limited or restricted Swallow Discharge Status (Y8657): 0 percent impaired, limited or restricted   Blenda Mounts Laurice 11/26/2012, 12:43 PM

## 2012-11-27 ENCOUNTER — Inpatient Hospital Stay (HOSPITAL_COMMUNITY): Payer: Medicare Other

## 2012-11-27 LAB — GRAM STAIN

## 2012-11-27 LAB — CSF CELL COUNT WITH DIFFERENTIAL: WBC, CSF: 1 /mm3 (ref 0–5)

## 2012-11-27 LAB — PROTEIN AND GLUCOSE, CSF: Glucose, CSF: 94 mg/dL — ABNORMAL HIGH (ref 43–76)

## 2012-11-27 MED ORDER — HYDROCHLOROTHIAZIDE 12.5 MG PO CAPS
12.5000 mg | ORAL_CAPSULE | Freq: Every day | ORAL | Status: DC
  Administered 2012-11-27 – 2012-11-29 (×3): 12.5 mg via ORAL
  Filled 2012-11-27 (×3): qty 1

## 2012-11-27 MED ORDER — POTASSIUM CHLORIDE CRYS ER 20 MEQ PO TBCR
30.0000 meq | EXTENDED_RELEASE_TABLET | Freq: Two times a day (BID) | ORAL | Status: DC
  Administered 2012-11-27 – 2012-11-29 (×4): 30 meq via ORAL
  Filled 2012-11-27 (×7): qty 1

## 2012-11-27 MED ORDER — GADOBENATE DIMEGLUMINE 529 MG/ML IV SOLN
15.0000 mL | Freq: Once | INTRAVENOUS | Status: AC
  Administered 2012-11-27: 15 mL via INTRAVENOUS

## 2012-11-27 MED ORDER — ACETAMINOPHEN 325 MG PO TABS
650.0000 mg | ORAL_TABLET | Freq: Four times a day (QID) | ORAL | Status: DC | PRN
  Administered 2012-11-27: 650 mg via ORAL
  Filled 2012-11-27: qty 2

## 2012-11-27 NOTE — Progress Notes (Signed)
Report called from MRI, relayed to Dr. March.

## 2012-11-27 NOTE — Procedures (Signed)
[facility] P[facility]M Procedure Note: Lumbar Puncture  Indication: Acute encephalopthy, concern for neurosyphilis  The patient's family was informed of the risks and benefits and gave consent, see chart  Procedure Description: The patient was positioned in the R lateral decubitus position and the L4-5 intervertebral space was palpated.  Betadine was then used for sterile skin prep and 2cc of 1% plain lidocaine was injected into the skin for local anesthesia.  The spinal needle was inserted into the intervertebral space and 12 cc of clear fluid drained into the collection vials.  The needle was removed. The patient tolerated the procedure well.  EBL < 1cc  Complications: none  The sample was given to the primary service physicians at the end of the procedure.  Yolonda Kida PCCM Pager: [phone] Cell: [phone] If no response, call [phone]

## 2012-11-27 NOTE — Progress Notes (Signed)
Occupational Therapy Treatment Patient Details Name: Joshua Gonzales MRN: [id number] DOB: [birth year] Today's Date: 11/27/2012 Time: 1308-6578 OT Time Calculation (min): 24 min  OT Assessment / Plan / Recommendation  History of present illness Pt is a 72 yo male admitted for weakness, increased falls, and decline in function.  Pt being worked up for vascular demenia vs. alcoholic encephalopathy.  Pt with no new CVA on CT scan but has had many past CVAs and changes on CT.  Pt with "plate" in his head from old head injury therefore MRI currently not performed.   OT comments  Pt performed functional mobility/transfers in room/bathroom and grooming/bathing at sink. Pt inconsistently following commands and requires assist for safety/sequencing w/ all tasks due to cognitive status. Cont toward poc/goals to maximize independence w/ ADL's/selfcare. Rec SNF.  Follow Up Recommendations  SNF    Barriers to Discharge       Equipment Recommendations       Recommendations for Other Services    Frequency Min 2X/week   Progress towards OT Goals Progress towards OT goals: Progressing toward goals  Plan Discharge plan remains appropriate    Precautions / Restrictions Precautions Precautions: Fall Precaution Comments: bed alarm, weakness, impulsivity/confusion Restrictions Weight Bearing Restrictions: No   Pertinent Vitals/Pain No c/o. No signs/symptoms of pain observed during treatment session either.    ADL  Grooming: Performed;Wash/dry hands;Wash/dry face;Minimal assistance;Moderate assistance Where Assessed - Grooming: Supported standing Upper Body Bathing: Performed;Chest;Right arm;Left arm;Abdomen;Minimal assistance Where Assessed - Upper Body Bathing: Supported standing Lower Body Bathing: Performed;Moderate assistance (pt w/ 1x LOB while standing at sink, did not self correct) Where Assessed - Lower Body Bathing: Supported standing Upper Body Dressing: Performed;Moderate  assistance Where Assessed - Upper Body Dressing: Supported sit to Pharmacist, hospital: Performed;Moderate assistance Toilet Transfer Method: Sit to stand Toilet Transfer Equipment: Raised toilet seat with arms (or 3-in-1 over toilet);Grab bars Toileting - Clothing Manipulation and Hygiene: Performed;Maximal assistance Where Assessed - Glass blower/designer Manipulation and Hygiene: Standing Equipment Used: Rolling walker;Gait belt Transfers/Ambulation Related to ADLs: Pt was Min guard to mod assist for functional mobility and transfers in room and on/off 3:1 in bathroom, due to safety issues. ADL Comments: Pt requires consistent 1 step and hand over hand assist for ADL's due to difficulty following commands and decreased safety awareness. Pt also w/ 1x LOB while standing at sink & did not attempt to self correct, narrow base of stance noted.    OT Diagnosis:    OT Problem List:   OT Treatment Interventions:     OT Goals(current goals can now be found in the care plan section)    Visit Information  Last OT Received On: 11/27/12 Assistance Needed: +1 History of Present Illness: Pt is a 72 yo male admitted for weakness, increased falls, and decline in function.  Pt being worked up for vascular demenia vs. alcoholic encephalopathy.  Pt with no new CVA on CT scan but has had many past CVAs and changes on CT.  Pt with "plate" in his head from old head injury therefore MRI currently not performed.    Subjective Data      Prior Functioning       Cognition  Cognition Arousal/Alertness: Awake/alert Behavior During Therapy: Impulsive Overall Cognitive Status: No family/caregiver present to determine baseline cognitive functioning Area of Impairment: Orientation;Attention;Memory;Following commands;Safety/judgement;Awareness;Problem solving Orientation Level: Disoriented to;Time;Situation Current Attention Level: Focused Memory: Decreased recall of precautions;Decreased short-term  memory Following Commands: Follows one step commands inconsistently;Follows multi-step commands inconsistently Safety/Judgement: Decreased  awareness of safety;Decreased awareness of deficits Problem Solving: Slow processing;Requires verbal cues;Requires tactile cues;Decreased initiation    Mobility  Bed Mobility Bed Mobility: Supine to Sit;Sitting - Scoot to Edge of Bed Supine to Sit: HOB flat;With rails;4: Min guard Sitting - Scoot to Edge of Bed: 4: Min guard Sit to Supine: HOB flat;4: Min assist Transfers Transfers: Sit to Stand;Stand to Sit Sit to Stand: 4: Min assist;From bed;From chair/3-in-1;With upper extremity assist;With armrests Stand to Sit: To bed;With upper extremity assist;3: Mod assist;To chair/3-in-1 Details for Transfer Assistance: VC's for hand placement, safety and sequencing. Posterior lean w/ narrow base of stance noted. Pt required assist for controlled decent to 3:1 over toilet as well as to bed.         Balance Balance Balance Assessed: Yes Static Standing Balance Static Standing - Balance Support: During functional activity;Left upper extremity supported Static Standing - Level of Assistance: 3: Mod assist Static Standing - Comment/# of Minutes: 8-10 min during grooming activities   End of Session OT - End of Session Equipment Utilized During Treatment: Gait belt;Rolling walker Activity Tolerance: Patient tolerated treatment well Patient left: in bed;with call bell/phone within reach;with bed alarm set Nurse Communication: Other (comment)  GO     Joshua Gonzales 11/27/2012, 12:23 PM

## 2012-11-27 NOTE — Progress Notes (Signed)
Attending Addendum  I examined the patient and discussed the assessment and plan with Dr. Waynetta Sandy. I have reviewed the note and agree. Patient's mental status has slightly improved today, but he is still clearly disoriented. Reviewed MRI which reveal acute and subacute R PCA infarcts. Plan to obtain LP. Hoped to get IR to perform today but I was informed will have to attempt at the bedside before IR would attempt as apart of a new policy. Plan to contact critical care to assist with bedside LP patient will very likely require sedation.     Dessa Phi, MD FAMILY MEDICINE TEACHING SERVICE

## 2012-11-27 NOTE — Progress Notes (Signed)
Pt complaining of pain in back at lp site. Site was assessed and there's no bleeding or redess. Paged MD on call. Order put in to give pt tylenol. Will continue to monitor pt

## 2012-11-27 NOTE — Progress Notes (Signed)
Family Medicine Teaching Service Daily Progress Note Intern Pager: [phone]  Patient name: Joshua Gonzales Medical record number: [id number] Date of birth: June 26, 1940 Age: 72 y.o. Gender: male  Primary Care Provider: Pcp Not In System Consultants: Neuro Code Status: Full, need to address  Pt Overview and Major Events to Date:  9/21 Admitted for confusion, overnight pulled out IV  Assessment and Plan: 72 y.o M presenting with AMS X3 weeks, CT with no acute findings other than multiple old infarcts and extensive microvascular dx. 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 Also consider wernicke encephalopathy vs NPH with incontinence, dementia, and ataxia. Also consider vitamin deficiency vs infectious though less likely given afebrile and normal WBC. Tylenol and salicylate levels normal. UA and UDS negative, alcohol level and ammonia normal. 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 Per ID spoke with  lab and has no hx of being treated for syphilis here (but previously lived in [place]) to dx neurosyphilis would need CSF vdrl.  Plan - Appreciate ID consult and recommendations "LP/VDRL, MRI, HIV RNA, urine gcct" - HIV quant negative - LP today, will need to obtain consent from proxy.  # Sinus arrhythmia, previous abnormal stress test - Prior NM study 2003 with small reversible perfusion defect anteroseptal portion of left ventricular apex, normal  EF, ?mild hypokinesia. Tele last night with sinus arrhythmia (likely multifocal but without tach)  - trops negative - Started aspirin 81 - Risk stratification labs (lipid panel with calculated ASCVD risk 26.2% - consider starting mod- or high-intensity statin though balance with age and dementia) - F/u telemetry for any atrial fibrillation; unlikely to anticoagulate demented patient with ataxia  # HTN - Likely chronic, uncontrolled, smoking hx. S/p clonidine in ED. BPs last 24hrs 145-161/77-93. - Continue hydralazine PRN SBP >180 or DBP>100. - norvasc 10 - Start HCTZ 12.5mg  daily  # Metabolic alkalosis - bicarb 36 on admission, no other electrolyte imbalance. Pt stable.Resolved on recheck Plan -BMET prn  # Hypokalemia to 3 upon admission Cr WNL, cause unknown other than poor diet/ little intake per niece repleted with 30 kdur X2 yesterday Plan - K+ 3.4 this AM, will replete again kdur x2  FEN/GI: Regular diet PPx: SQ heparin  Disposition: Pending further evaluation for cause of encephalopathy, LP and MRI today  Subjective:  Received ativan x 1 last night for increased agitation (CIWA 8) and pulling off EKG leads. Complains of "stiffness" this morning, but that he is "alright alright". He does not know he is in a hospital, thinks it is 60, and thinks the president is "Homero Fellers" something. I explained the need for an LP today, risks/benefits, and he agreed to it. H seemed to understand as he was able to repeat back to me what it was for.   Objective: Temp:  [98 F (36.7 C)-98.2 F (36.8 C)] 98.2 F (36.8 C) (09/23 2024) Pulse Rate:  [75-88] 88 (09/23 2024)  Resp:  [18-20] 20 (09/23 2024) BP: (145-161)/(77-93) 161/93 mmHg (09/23 2024) SpO2:  [94 %-98 %] 98 % (09/23 1539) Physical Exam: General: NAD, sleeping between questions HEENT: PERRL Cardiovascular: RRR, normal heart sounds Respiratory: CTAB, normal effort Abdomen: Soft, nontender, nondistended Extremities: No LE edema  or tenderness Neuro: Oriented x1. Spontaneous eye opening and extremity movement.  Laboratory:  Recent Labs Lab 11/23/12 1614 11/23/12 2255  WBC 5.4 6.1  HGB 15.7 14.5  HCT 43.8 41.2  PLT 209 200    Recent Labs Lab 11/23/12 1614  11/24/12 0227 11/25/12 0651 11/26/12 1127  NA 141  --  135 134* 135  K 3.5  --  3.0* 3.2* 3.4*  CL 98  --  97 99 98  CO2 36*  --  29 24 28   BUN 15  --  14 10 9   CREATININE 1.07  < > 0.91 0.76 0.96  CALCIUM 9.5  --  9.0 9.4 9.5  PROT 8.1  --  6.8  --   --   BILITOT 0.4  --  0.3  --   --   ALKPHOS 66  --  59  --   --   ALT 6  --  6  --   --   AST 22  --  15  --   --   GLUCOSE 102*  --  91 96 97  < > = values in this interval not displayed. Urinalysis: negative Lipid Panel     Component Value Date/Time   CHOL 117 11/24/2012 0227   TRIG 75 11/24/2012 0227   HDL 43 11/24/2012 0227   CHOLHDL 2.7 11/24/2012 0227   VLDL 15 11/24/2012 0227   LDLCALC 59 11/24/2012 0227   Troponin neg x 2 RPR titer: REACTIVE HIV: non-reactive Vit B12: 270 Folate: pending APAP level: <15.0 TSH: 0.910 A1c: 6.7 Ethanol 11mg /dL UDS: negative Salicylate level: 2.6  Imaging/Diagnostic Tests: Chest x-ray : emphysema  CT Head wo contrast: IMPRESSION:  Atrophy with multiple prior infarcts and extensive small vessel  disease. No acute appearing infarct is appreciable on this study.  There is no hemorrhage or mass effect.   Tawni Carnes, MD 11/27/2012, 7:24 AM PGY-1, [facility] Family Medicine FPTS Intern pager: [phone], text pages welcome

## 2012-11-27 NOTE — Progress Notes (Signed)
Physical Therapy Treatment Patient Details Name: Joshua Gonzales MRN: [id number] DOB: Feb 27, 1941 Today's Date: 11/27/2012 Time: 1535-1550 PT Time Calculation (min): 15 min  PT Assessment / Plan / Recommendation  History of Present Illness Pt is a 72 yo male admitted for weakness, increased falls, and decline in function.  Pt being worked up for vascular demenia vs. alcoholic encephalopathy.  Pt with no new CVA on CT scan but has had many past CVAs and changes on CT.  Pt with "plate" in his head from old head injury therefore MRI currently not performed.   PT Comments   Pt making slow, steady progress toward his PT goals.  Follow Up Recommendations  SNF;Supervision/Assistance - 24 hour     Equipment Recommendations  None recommended by PT    Frequency Min 3X/week   Progress towards PT Goals Progress towards PT goals: Progressing toward goals  Plan Current plan remains appropriate    Precautions / Restrictions Precautions Precautions: Fall Precaution Comments: bed alarm, weakness, impulsivity/confusion Restrictions Weight Bearing Restrictions: No    Mobility  Bed Mobility Bed Mobility: Supine to Sit;Sitting - Scoot to Edge of Bed Supine to Sit: Not tested (comment) Sitting - Scoot to Edge of Bed: 4: Min guard Sit to Supine: HOB flat;4: Min assist Details for Bed Mobility Assistance: pt out of bed ambulating with HHA with MD on arrival to room. Transfers Sit to Stand: 4: Min assist;From bed;From chair/3-in-1;With upper extremity assist;With armrests Stand to Sit: 4: Min assist;To chair/3-in-1;With upper extremity assist;With armrests Details for Transfer Assistance: cues for hand placement and safety with sitting down to recliner Ambulation/Gait Ambulation/Gait Assistance: 4: Min assist Ambulation Distance (Feet): 150 Feet Assistive device: Rolling walker;1 person hand held assist Ambulation/Gait Assistance Details: increased assist needed with HHA vs RW. cues for posture and  walker use/position/barrier negotiation with gait. LOB with walker x3 with distraction/scanning enviroment. Gait Pattern: Step-through pattern;Decreased stride length;Trunk flexed;Narrow base of support;Scissoring      PT Goals (current goals can now be found in the care plan section)    Visit Information  Last PT Received On: 11/27/12 Assistance Needed: +1 History of Present Illness: Pt is a 72 yo male admitted for weakness, increased falls, and decline in function.  Pt being worked up for vascular demenia vs. alcoholic encephalopathy.  Pt with no new CVA on CT scan but has had many past CVAs and changes on CT.  Pt with "plate" in his head from old head injury therefore MRI currently not performed.       Cognition  Cognition Arousal/Alertness: Awake/alert Behavior During Therapy: Impulsive Overall Cognitive Status: No family/caregiver present to determine baseline cognitive functioning Area of Impairment: Orientation;Attention;Memory;Following commands;Safety/judgement;Awareness;Problem solving Orientation Level: Disoriented to;Place;Time;Situation Current Attention Level: Focused Memory: Decreased recall of precautions;Decreased short-term memory Following Commands: Follows one step commands inconsistently;Follows multi-step commands inconsistently Safety/Judgement: Decreased awareness of safety;Decreased awareness of deficits Problem Solving: Slow processing;Difficulty sequencing;Requires verbal cues;Decreased initiation    Balance  Balance Balance Assessed: Yes Static Standing Balance Static Standing - Balance Support: During functional activity;Left upper extremity supported Static Standing - Level of Assistance: 3: Mod assist Static Standing - Comment/# of Minutes: 8-10 min during grooming activities  End of Session PT - End of Session Equipment Utilized During Treatment: Gait belt Activity Tolerance: Patient tolerated treatment well Patient left: in chair;with  nursing/sitter in room;with call bell/phone within reach Nurse Communication: Mobility status   GP     Sallyanne Kuster  11/27/2012, 4:14 PM  Sallyanne Kuster, PTA Office- [phone]

## 2012-11-27 NOTE — Clinical Social Work Psychosocial (Signed)
Clinical Social Work Department BRIEF PSYCHOSOCIAL ASSESSMENT 11/27/2012  Patient:  Joshua Gonzales, Joshua Gonzales     Account Number:  [id number]     Admit date:  11/23/2012  Clinical Social Worker:  Lavell Luster  Date/Time:  11/27/2012 02:00 PM  Referred by:  Physician  Date Referred:  11/27/2012 Referred for  SNF Placement   Other Referral:   Interview type:  Other - See comment Other interview type:   CSW interviewed neice and sister as patient is not oriented x4 at this time.    PSYCHOSOCIAL DATA Living Status:  OTHER RELATIVE Admitted from facility:   Level of care:   Primary support name:  Ernst Spell Janee Morn [phone] Primary support relationship to patient:  SIBLING Degree of support available:   Support is good.    CURRENT CONCERNS Current Concerns  Post-Acute Placement   Other Concerns:    SOCIAL WORK ASSESSMENT / PLAN CSW met with niece Marny Lowenstein [phone] and sister Ivar Drape to discuss recommendation for SNF and SNF search process. Family is agreeable to SNF search. Patient has no previous SNF experience. Patient was living with niece prior to hospital admission and the plan is for him to return there after SNF. CSW answered questions about insurance. Family wants a facility in [facility] and would prefer [facility]. Other family contact: [facility] [phone]   Assessment/plan status:  Psychosocial Support/Ongoing Assessment of Needs Other assessment/ plan:   Complete FL2, PASRR, Fax out.   Information/referral to community resources:   SNF list and CSW contact information given to niece and sister.    PATIENT'S/FAMILY'S RESPONSE TO PLAN OF CARE: Neice and sister were both pleasant and appreciative of CSW visit. Family agreeable to SNF placement and await bed offers. CSW will continue to follow.    Roddie Mc, [place], [place], [phone]

## 2012-11-27 NOTE — Progress Notes (Addendum)
INFECTIOUS DISEASE PROGRESS NOTE  ID: Joshua Gonzales is a 72 y.o. male with  Active Problems:   Acute encephalopathy   Hypokalemia   Alcoholism in remission  Subjective: Sitting up comforatable, without complaint.  Still disoriented to the date, thinks it is 59.  Abtx:  Anti-infectives   None      Medications:  Scheduled: . amLODipine  10 mg Oral Daily  . aspirin EC  81 mg Oral Daily  . feeding supplement  237 mL Oral BID BM  . folic acid  1 mg Oral Daily  . heparin  5,000 Units Subcutaneous Q8H  . hydrochlorothiazide  12.5 mg Oral Daily  . multivitamin with minerals  1 tablet Oral Daily  . nicotine  7 mg Transdermal Daily  . potassium chloride  30 mEq Oral BID  . sodium chloride  3 mL Intravenous Q12H  . sodium chloride  3 mL Intravenous Q12H  . thiamine (VITAMIN B1) IVPB  500 mg Intravenous TID   Followed by  . [START ON 11/28/2012] thiamine (VITAMIN B1) IVPB  250 mg Intravenous Q24H    Objective: Vital signs in last 24 hours: Temp:  [98.1 F (36.7 C)-98.2 F (36.8 C)] 98.2 F (36.8 C) (09/23 2024) Pulse Rate:  [81-88] 88 (09/23 2024) Resp:  [18-20] 20 (09/23 2024) BP: (145-161)/(77-93) 145/86 mmHg (09/24 1103) SpO2:  [98 %] 98 % (09/23 1539)   General appearance: alert, cooperative and no distress Resp: clear to auscultation bilaterally Cardio: regular rate and rhythm, S1, S2 normal, no murmur, click, rub or gallop GI: soft, non-tender; bowel sounds normal; no masses,  no organomegaly Extremities: extremities normal, atraumatic, no cyanosis or edema Skin: Skin color, texture, turgor normal. No rashes or lesions  Lab Results  Recent Labs  11/25/12 0651 11/26/12 1127  NA 134* 135  K 3.2* 3.4*  CL 99 98  CO2 24 28  BUN 10 9  CREATININE 0.76 0.96   Liver Panel No results found for this basename: PROT, ALBUMIN, AST, ALT, ALKPHOS, BILITOT, BILIDIR, IBILI,  in the last 72 hours Sedimentation Rate No results found for this basename: ESRSEDRATE,   in the last 72 hours C-Reactive Protein No results found for this basename: CRP,  in the last 72 hours  Microbiology: No results found for this or any previous visit (from the past 240 hour(s)).  Studies/Results: Mr Shirlee Latch Wo Contrast  11/27/2012   CLINICAL DATA:  Acute confusion in compatible dense. Encephalopathy versus CVA.  EXAM: MRI HEAD WITHOUT CONTRAST  MRA HEAD WITHOUT CONTRAST  TECHNIQUE: Multiplanar, multiecho pulse sequences of the brain and surrounding structures were obtained without intravenous contrast. Angiographic images of the head were obtained using MRA technique without contrast.  COMPARISON:  CT head without contrast 11/23/2012.  FINDINGS: MRI HEAD FINDINGS  Acute and subacute left PCA territory infarcts are evident. Diffuse cortical enhancement is evident within the more subacute portion and. Remote encephalomalacia is evident within the inferior left cerebellum and. Remote lacunar infarcts are present throughout the basal ganglia bilaterally. Age advanced atrophy and extensive white matter disease is present bilaterally. Flow is present in the major intracranial arteries. The right vertebral artery is not visualized and is likely hypoplastic. The globes and orbits are intact. The paranasal sinuses and mastoid air cells are clear.  MRA HEAD FINDINGS  The internal carotid arteries demonstrate mild atherosclerotic irregularity within the cavernous segments bilaterally. There is approximately 50% stenosis is present in the proximal cavernous segment on the left. The more distal  vessel is within normal limits. The A1 and M1 segments are normal. The anterior communicating artery is patent. Moderate small vessel disease is evident within the proximal MCA and ACA branch vessels bilaterally. The right A2 segment is more affected than the left.  The left vertebral artery is the dominant vessel. The basilar artery is within normal limits. Both posterior cerebral arteries originate from the  basilar tip. Moderate attenuation of PCA branch vessels is evident bilaterally. The right vertebral artery is hypoplastic and terminates at the PICA.  IMPRESSION: MRI HEAD IMPRESSION  1. Acute and subacute left PCA territory infarcts within the medial left occipital lobe. 2. Remote encephalomalacia of the inferior left cerebellum. 3. Atrophy and diffuse white matter disease is advanced for age. This likely reflects the sequela of chronic microvascular ischemia. 4. Multiple bilateral remote lacunar infarcts of the basal ganglia.  MRA HEAD IMPRESSION  1. Moderate diffuse small vessel disease likely accounts for the areas of encephalomalacia and acute/subacute ischemia. 2. 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 3. 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   Electronically Signed   By: Gennette Pac   On: 11/27/2012 14:19   Mr Brain W Wo Contrast  11/27/2012   CLINICAL DATA:  Acute confusion in compatible dense. Encephalopathy versus CVA.  EXAM: MRI HEAD WITHOUT CONTRAST  MRA HEAD WITHOUT CONTRAST  TECHNIQUE: Multiplanar, multiecho pulse sequences of the brain and surrounding structures were obtained without intravenous contrast. Angiographic images of the head were obtained using MRA technique without contrast.  COMPARISON:  CT head without contrast 11/23/2012.  FINDINGS: MRI HEAD FINDINGS  Acute and subacute left PCA territory infarcts are evident. Diffuse cortical enhancement is evident within the more subacute portion and. Remote encephalomalacia is evident within the inferior left cerebellum and. Remote lacunar infarcts are present throughout the basal ganglia bilaterally. Age advanced atrophy and extensive white matter disease is present bilaterally. Flow is present in the major intracranial arteries. The right vertebral artery is not visualized and is likely hypoplastic. The globes and orbits are intact. The paranasal sinuses and mastoid air cells are clear.   MRA HEAD FINDINGS  The internal carotid arteries demonstrate mild atherosclerotic irregularity within the cavernous segments bilaterally. There is approximately 50% stenosis is present in the proximal cavernous segment on the left. The more distal vessel is within normal limits. The A1 and M1 segments are normal. The anterior communicating artery is patent. Moderate small vessel disease is evident within the proximal MCA and ACA branch vessels bilaterally. The right A2 segment is more affected than the left.  The left vertebral artery is the dominant vessel. The basilar artery is within normal limits. Both posterior cerebral arteries originate from the basilar tip. Moderate attenuation of PCA branch vessels is evident bilaterally. The right vertebral artery is hypoplastic and terminates at the PICA.  IMPRESSION: MRI HEAD IMPRESSION  1. Acute and subacute left PCA territory infarcts within the medial left occipital lobe. 2. Remote encephalomalacia of the inferior left cerebellum. 3. Atrophy and diffuse white matter disease is advanced for age. This likely reflects the sequela of chronic microvascular ischemia. 4. Multiple bilateral remote lacunar infarcts of the basal ganglia.  MRA HEAD IMPRESSION  1. Moderate diffuse small vessel disease likely accounts for the areas of encephalomalacia and acute/subacute ischemia. 2. 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 3. 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   Electronically Signed   By: Gennette Pac  On: 11/27/2012 14:19     Assessment/Plan: Acute encephalopathy-  Still awaiting LP for CSF VDRL results to determine if patient should be treated for neuroshyphillis  Would suggest his MRI gives multiple reasons for his mental status change.   He is somewhat better         Rica Koyanagi Infectious Diseases (pager) [phone] [email or web address] 11/27/2012, 2:53 PM  LOS: 4 days

## 2012-11-27 NOTE — Progress Notes (Signed)
I attempted to call the patient's niece Shelbie Proctor) and spouse at the numbers provided in the chart to provide an update, including MRI results and discuss LP.  I was unable to leave a VM at his niece's number [phone] as the mailbox was full.  I did get through on the spouse # a gentleman answered and informed me that I had the wrong # [phone].

## 2012-11-28 DIAGNOSIS — I6350 Cerebral infarction due to unspecified occlusion or stenosis of unspecified cerebral artery: Principal | ICD-10-CM

## 2012-11-28 NOTE — Clinical Social Work Placement (Signed)
Clinical Social Work Department CLINICAL SOCIAL WORK PLACEMENT NOTE 11/28/2012  Patient:  Joshua Gonzales, Joshua Gonzales  Account Number:  [id number] Admit date:  11/23/2012  Clinical Social Worker:  Lavell Luster  Date/time:  11/28/2012 03:42 PM  Clinical Social Work is seeking post-discharge placement for this patient at the following level of care:   SKILLED NURSING   (*CSW will update this form in Epic as items are completed)   11/27/2012  Patient/family provided with [facility] Department of Clinical Social Work's list of facilities offering this level of care within the geographic area requested by the patient (or if unable, by the patient's family).  11/27/2012  Patient/family informed of their freedom to choose among providers that offer the needed level of care, that participate in Medicare, Medicaid or managed care program needed by the patient, have an available bed and are willing to accept the patient.  11/27/2012  Patient/family informed of [facility]' ownership interest in [facility], as well as of the fact that they are under no obligation to receive care at this facility.  PASARR submitted to [employer] on 11/27/2012 PASARR number received from [employer] on 11/27/2012  FL2 transmitted to all facilities in geographic area requested by pt/family on  11/28/2012 FL2 transmitted to all facilities within larger geographic area on 11/28/2012  Patient informed that his/her managed care company has contracts with or will negotiate with  certain facilities, including the following:     Patient/family informed of bed offers received:   Patient chooses bed at  Physician recommends and patient chooses bed at    Patient to be transferred to  on   Patient to be transferred to facility by   The following physician request were entered in Epic:   Additional Comments:  Roddie Mc, [place], [place], [phone]

## 2012-11-28 NOTE — Progress Notes (Signed)
INFECTIOUS DISEASE PROGRESS NOTE  ID: Joshua Gonzales is a 72 y.o. male with  Active Problems:   Acute encephalopathy   Hypokalemia   Alcoholism in remission  Subjective: Up walking in room, without complaint  Abtx:  Anti-infectives   None      Medications:  Scheduled: . amLODipine  10 mg Oral Daily  . aspirin EC  81 mg Oral Daily  . feeding supplement  237 mL Oral BID BM  . folic acid  1 mg Oral Daily  . heparin  5,000 Units Subcutaneous Q8H  . hydrochlorothiazide  12.5 mg Oral Daily  . multivitamin with minerals  1 tablet Oral Daily  . nicotine  7 mg Transdermal Daily  . potassium chloride  30 mEq Oral BID  . sodium chloride  3 mL Intravenous Q12H  . sodium chloride  3 mL Intravenous Q12H  . thiamine (VITAMIN B1) IVPB  250 mg Intravenous Q24H    Objective: Vital signs in last 24 hours: Temp:  [97.5 F (36.4 C)-99.1 F (37.3 C)] 97.5 F (36.4 C) (09/25 1400) Pulse Rate:  [80-95] 84 (09/25 1400) Resp:  [18-20] 18 (09/25 1400) BP: (116-145)/(72-89) 117/79 mmHg (09/25 1400) SpO2:  [91 %-95 %] 95 % (09/25 1400)   General appearance: alert, cooperative and no distress  Lab Results  Recent Labs  11/26/12 1127  NA 135  K 3.4*  CL 98  CO2 28  BUN 9  CREATININE 0.96   Liver Panel No results found for this basename: PROT, ALBUMIN, AST, ALT, ALKPHOS, BILITOT, BILIDIR, IBILI,  in the last 72 hours Sedimentation Rate No results found for this basename: ESRSEDRATE,  in the last 72 hours C-Reactive Protein No results found for this basename: CRP,  in the last 72 hours  Microbiology: Recent Results (from the past 240 hour(s))  CSF CULTURE     Status: None   Collection Time    11/27/12  9:03 PM      Result Value Range Status   Specimen Description CSF   Final   Special Requests NONE   Final   Gram Stain     Final   Value: RARE WBC PRESENT, PREDOMINANTLY PMN     NO ORGANISMS SEEN     Performed at [facility]     Performed at [facility]   Culture     Final   Value: NO GROWTH     Performed at [employer] [facility]   Report Status PENDING   Incomplete  GRAM STAIN     Status: None   Collection Time    11/27/12  9:04 PM      Result Value Range Status   Specimen Description CSF   Final   Special Requests NONE   Final   Gram Stain     Final   Value: WBC PRESENT, PREDOMINANTLY MONONUCLEAR     NO ORGANISMS SEEN     CYTOSPIN SLIDE   Report Status 11/27/2012 FINAL   Final    Studies/Results: Mr Maxine Glenn Head Wo Contrast  11/27/2012   CLINICAL DATA:  Acute confusion in compatible dense. Encephalopathy versus CVA.  EXAM: MRI HEAD WITHOUT CONTRAST  MRA HEAD WITHOUT CONTRAST  TECHNIQUE: Multiplanar, multiecho pulse sequences of the brain and surrounding structures were obtained without intravenous contrast. Angiographic images of the head were obtained using MRA technique without contrast.  COMPARISON:  CT head without contrast 11/23/2012.  FINDINGS: MRI HEAD FINDINGS  Acute and subacute left PCA territory infarcts are  evident. Diffuse cortical enhancement is evident within the more subacute portion and. Remote encephalomalacia is evident within the inferior left cerebellum and. Remote lacunar infarcts are present throughout the basal ganglia bilaterally. Age advanced atrophy and extensive white matter disease is present bilaterally. Flow is present in the major intracranial arteries. The right vertebral artery is not visualized and is likely hypoplastic. The globes and orbits are intact. The paranasal sinuses and mastoid air cells are clear.  MRA HEAD FINDINGS  The internal carotid arteries demonstrate mild atherosclerotic irregularity within the cavernous segments bilaterally. There is approximately 50% stenosis is present in the proximal cavernous segment on the left. The more distal vessel is within normal limits. The A1 and M1 segments are normal. The anterior communicating artery is patent. Moderate small vessel disease is evident  within the proximal MCA and ACA branch vessels bilaterally. The right A2 segment is more affected than the left.  The left vertebral artery is the dominant vessel. The basilar artery is within normal limits. Both posterior cerebral arteries originate from the basilar tip. Moderate attenuation of PCA branch vessels is evident bilaterally. The right vertebral artery is hypoplastic and terminates at the PICA.  IMPRESSION: MRI HEAD IMPRESSION  1. Acute and subacute left PCA territory infarcts within the medial left occipital lobe. 2. Remote encephalomalacia of the inferior left cerebellum. 3. Atrophy and diffuse white matter disease is advanced for age. This likely reflects the sequela of chronic microvascular ischemia. 4. Multiple bilateral remote lacunar infarcts of the basal ganglia.  MRA HEAD IMPRESSION  1. Moderate diffuse small vessel disease likely accounts for the areas of encephalomalacia and acute/subacute ischemia. 2. 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 3. 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   Electronically Signed   By: Gennette Pac   On: 11/27/2012 14:19   Mr Brain W Wo Contrast  11/27/2012   CLINICAL DATA:  Acute confusion in compatible dense. Encephalopathy versus CVA.  EXAM: MRI HEAD WITHOUT CONTRAST  MRA HEAD WITHOUT CONTRAST  TECHNIQUE: Multiplanar, multiecho pulse sequences of the brain and surrounding structures were obtained without intravenous contrast. Angiographic images of the head were obtained using MRA technique without contrast.  COMPARISON:  CT head without contrast 11/23/2012.  FINDINGS: MRI HEAD FINDINGS  Acute and subacute left PCA territory infarcts are evident. Diffuse cortical enhancement is evident within the more subacute portion and. Remote encephalomalacia is evident within the inferior left cerebellum and. Remote lacunar infarcts are present throughout the basal ganglia bilaterally. Age advanced atrophy and extensive  white matter disease is present bilaterally. Flow is present in the major intracranial arteries. The right vertebral artery is not visualized and is likely hypoplastic. The globes and orbits are intact. The paranasal sinuses and mastoid air cells are clear.  MRA HEAD FINDINGS  The internal carotid arteries demonstrate mild atherosclerotic irregularity within the cavernous segments bilaterally. There is approximately 50% stenosis is present in the proximal cavernous segment on the left. The more distal vessel is within normal limits. The A1 and M1 segments are normal. The anterior communicating artery is patent. Moderate small vessel disease is evident within the proximal MCA and ACA branch vessels bilaterally. The right A2 segment is more affected than the left.  The left vertebral artery is the dominant vessel. The basilar artery is within normal limits. Both posterior cerebral arteries originate from the basilar tip. Moderate attenuation of PCA branch vessels is evident bilaterally. The right vertebral artery is hypoplastic and terminates at  the PICA.  IMPRESSION: MRI HEAD IMPRESSION  1. Acute and subacute left PCA territory infarcts within the medial left occipital lobe. 2. Remote encephalomalacia of the inferior left cerebellum. 3. Atrophy and diffuse white matter disease is advanced for age. This likely reflects the sequela of chronic microvascular ischemia. 4. Multiple bilateral remote lacunar infarcts of the basal ganglia.  MRA HEAD IMPRESSION  1. Moderate diffuse small vessel disease likely accounts for the areas of encephalomalacia and acute/subacute ischemia. 2. 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 3. 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   Electronically Signed   By: Gennette Pac   On: 11/27/2012 14:19     Assessment/Plan: Acute encephalopathy-   Would suggest his MRI gives multiple reasons for his mental status change.  His LP does not  particularly fit neurosyphillis- no increase in protein, normal WBC Await his CSF VDRL to decide his treatment regimen (daily vs weekly PEN G)  Total days of antibiotics: 0          Joshua Gonzales Infectious Diseases (pager) [phone] [email or web address] 11/28/2012, 3:06 PM  LOS: 5 days

## 2012-11-28 NOTE — Progress Notes (Signed)
Family Medicine Teaching Service Daily Progress Note Intern Pager: [phone]  Patient name: Joshua Gonzales Medical record number: [id number] Date of birth: 11/15/1940 Age: 72 y.o. Gender: male  Primary Care Provider: Pcp Not In System Consultants: Neuro Code Status: Full, need to address  Pt Overview and Major Events to Date:  9/21 Admitted for confusion, overnight pulled out IV 9/24: LP and MRI/MRA  Assessment and Plan: 72 y.o M presenting with AMS X3 weeks, CT with no acute findings other than multiple old infarcts and extensive microvascular dx. 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 Also consider wernicke encephalopathy vs NPH with incontinence, dementia, and ataxia. Also consider vitamin deficiency vs infectious though less likely given afebrile and normal WBC. Tylenol and salicylate levels normal. UA and UDS negative, alcohol level and ammonia normal. Still continues to be confused this am Plan - Dosed thiamine 100mg  IV daily - Pt will need a PCP on discharge - CIWA 8, ativan x 1 yesterday at 7pm (agitated, pulling off EKG leads) - Continue to monitor though suspect much of the deficit may be permanent - Consulted neurology, "consider MRI brain" appreciate their recommendations - PT/OT recs: SNF  - CSW: SNF search underway - MRI: acute and subacute left PCA territory infarcts in medial left occipital lobe; encephalomalacia of interior left cerebellum - MRA: moderate diffuse small vessel disease, moderate stenosis of cavernous left ICA, hypoplastic right vertebral artery terminates at right PICA  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 Per ID spoke with [facility] and has no hx of being treated for syphilis here (but previously lived in [place]) to dx neurosyphilis would need CSF vdrl.  Plan - Appreciate ID consult and recommendations  "LP/VDRL, MRI, HIV RNA, urine gcct" - HIV quant negative - Urine GC still needs to be collected - LP does not appear to be infected: WBC 1, occasional lymphs, rare monocyte, glucose 94, protein 25. VDRL in process, CSF cx rare PMNs, no organisms (final result pending)  # Sinus arrhythmia, previous abnormal stress test - Prior NM study 2003 with small reversible perfusion defect anteroseptal portion of left ventricular apex, normal EF, ?mild hypokinesia. Tele last night with sinus arrhythmia (likely multifocal but without tach)  - trops negative - Started aspirin 81 - Risk stratification labs (lipid panel with calculated ASCVD risk 26.2% - consider starting mod- or high-intensity statin though balance with age and dementia) - F/u telemetry for any atrial fibrillation; unlikely to anticoagulate demented patient with ataxia  # HTN - Likely chronic, uncontrolled, smoking hx. S/p clonidine in ED. BPs last 24hrs 145-161/77-93. - Continue hydralazine PRN SBP >180 or DBP>100. - norvasc 10 - Start HCTZ 12.5mg  daily  # Metabolic alkalosis - bicarb 36 on admission, no other electrolyte imbalance. Pt stable.Resolved on recheck Plan -BMET prn  FEN/GI: Regular diet PPx: SQ heparin  Disposition: pending discharge to SNF   Subjective:  Complains of being chilly this morning, entire body, head and face covered by blanket (room is colder). No complaints of any pain, the pain in his back from the LP last night resolved with tylenol. He is more alert this morning and slightly more oriented: [place], knows he isn't at home and with leading says he is at the hospital. Says year is 7 and when corrected he acts surprised, and when re-asked near end of exam says "two thousand...". Says president  is "Danae Orleans" and then acts like he knew it was Obama after corrected.  Objective: Temp:  [97.7 F (36.5 C)-99.1 F (37.3 C)] 99.1 F (37.3 C) (09/24 2141) Pulse Rate:  [80-95] 85 (09/25 0604) Resp:  [18-20] 20  (09/25 0604) BP: (116-145)/(72-89) 116/72 mmHg (09/25 0604) SpO2:  [91 %-94 %] 94 % (09/25 0604) Physical Exam: General: NAD, easily awoken this morning HEENT: PERRL, EOMI Cardiovascular: RRR, normal heart sounds Respiratory: CTAB, normal effort Abdomen: Soft, nontender, nondistended Extremities: No LE edema or tenderness Neuro: Oriented x1. Spontaneous eye opening and extremity movement.  Laboratory:  Recent Labs Lab 11/23/12 1614 11/23/12 2255  WBC 5.4 6.1  HGB 15.7 14.5  HCT 43.8 41.2  PLT 209 200    Recent Labs Lab 11/23/12 1614  11/24/12 0227 11/25/12 0651 11/26/12 1127  NA 141  --  135 134* 135  K 3.5  --  3.0* 3.2* 3.4*  CL 98  --  97 99 98  CO2 36*  --  29 24 28   BUN 15  --  14 10 9   CREATININE 1.07  < > 0.91 0.76 0.96  CALCIUM 9.5  --  9.0 9.4 9.5  PROT 8.1  --  6.8  --   --   BILITOT 0.4  --  0.3  --   --   ALKPHOS 66  --  59  --   --   ALT 6  --  6  --   --   AST 22  --  15  --   --   GLUCOSE 102*  --  91 96 97  < > = values in this interval not displayed. Urinalysis: negative Lipid Panel     Component Value Date/Time   CHOL 117 11/24/2012 0227   TRIG 75 11/24/2012 0227   HDL 43 11/24/2012 0227   CHOLHDL 2.7 11/24/2012 0227   VLDL 15 11/24/2012 0227   LDLCALC 59 11/24/2012 0227   Troponin neg x 2 RPR titer: REACTIVE HIV: non-reactive Vit B12: 270 Folate: pending APAP level: <15.0 TSH: 0.910 A1c: 6.7 Ethanol 11mg /dL UDS: negative Salicylate level: 2.6  LP: Protein 25, Glucose 94 (H), RBCs 124 (H), WBC 1, occasional lymphs, rare monocytes CSF Cx: rare WBC (predominately PMN), no organisms  Imaging/Diagnostic Tests: Chest x-ray : emphysema  CT Head wo contrast: IMPRESSION:  Atrophy with multiple prior infarcts and extensive small vessel  disease. No acute appearing infarct is appreciable on this study.  There is no hemorrhage or mass effect.  MPRESSION:  MRI HEAD IMPRESSION  1. Acute and subacute left PCA territory infarcts within  the medial  left occipital lobe.  2. Remote encephalomalacia of the inferior left cerebellum.  3. Atrophy and diffuse white matter disease is advanced for age.  This likely reflects the sequela of chronic microvascular ischemia.  4. Multiple bilateral remote lacunar infarcts of the basal ganglia.  MRA HEAD IMPRESSION  1. Moderate diffuse small vessel disease likely accounts for the  areas of encephalomalacia and acute/subacute ischemia.  2. 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  3. 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   Tawni Carnes, MD 11/28/2012, 6:26 AM PGY-1, [facility] Family Medicine FPTS Intern pager: [phone], text pages welcome

## 2012-11-28 NOTE — Discharge Summary (Signed)
Family Medicine Teaching [facility] Discharge Summary  Patient name: Joshua Gonzales Medical record number: [id number] Date of birth: 1940-09-23 Age: 72 y.o. 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 male that was admitted encephalopathy x 3 weeks. PMHx significant for uncontrolled HTN, afib (not on anticoagulation), ethanol and tobacco abuse.  1. Encephalopathy, secondary to left PCA infarct and vascular dementia: on presentation patient found to be hypertensive to 200s/100s and with confusion. Initial workup included normal ammonia, vitamin B12 and folate, TSH, ethanol, UDS, salicylate level. Cmet only significant for mild metabolic alkalosis that resolved by repeat bmet several hours later. CT head significant for atrophy with multiple prior infarcts and extensive small vessel disease, but no acute hemorrhage or mass effect. RPR was found to be reactive with workup for neurosyphilis likely negative (see below). MRI and MRA done on HD#4 found a left PCA infarct of medial occipital lobe, atrophy consistent with chronic microvascular ischemia, encephalomalacia of inferior left cerebellum, and bilateral old lacunar infarcts of basal ganglia; MRA showed small vessel disease, stenosis of left ICA, and hypoplastic right vertebral artery. Mentation and gait continued to improve during hospital course but remained not oriented to year.  2. 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 Pallidum IgG >8.00. ID  consulted for further workup. No history of being treated for syphilis in [place]. Given clinical picture, neurosyphilis workup done with MRI/MRA and LP. LP was not supportive of neurosyphilis but VDRL of CSF done to determine treatment course was positive. He was started on Penicillin and Probenecid on 11/29/12. He will need RPR titer 3 to 6 months from discharge (02/28/13 - 05/29/12) to test for treatment response. 3. Sinus arrythmia: history of afib (not on anticoagulation), EKG and telemetry show sinus with arrhythmia, likely multifocal but without tachycardia. Given his history of recent gait unsteadiness and falls, anticoagulation was not started. 4. Hypertension: on presentation BPs elevated to as high as 200s/100s requiring clonidine in ED. Amlodipine 10mg  started but BP still elevated to 180/100, and hydrochlorothiazide 12.5mg  added with better control in 110-130/70-80 range.  Issues for Follow Up:  1. Syphilis: pending CSF VDRL on discharge to decide treatment course (daily vs weekly penicillin G) 2. Hypertension: started on amlodipine and hydrochlorothiazide while in hospital, BPs in range of 110-140/80s  Significant Procedures: Lumbar puncture  Significant Labs and Imaging:   Recent Labs Lab 11/23/12 1614 11/23/12 2255  WBC 5.4 6.1  HGB 15.7 14.5  HCT 43.8 41.2  PLT 209 200    Recent Labs Lab 11/23/12 1614 11/23/12 2255 11/24/12 0227 11/25/12 0651 11/26/12 1127  NA 141  --  135 134* 135  K 3.5  --  3.0* 3.2* 3.4*  CL 98  --  97 99 98  CO2 36*  --  29 24 28   GLUCOSE 102*  --  91 96 97  BUN 15  --  14 10 9   CREATININE 1.07 1.00 0.91 0.76 0.96  CALCIUM 9.5  --  9.0 9.4 9.5  ALKPHOS 66  --  59  --   --   AST 22  --  15  --   --   ALT 6  --  6  --   --   ALBUMIN 3.9  --  3.2*  --   --    Lipid Panel     Component Value Date/Time   CHOL 117 11/24/2012 0227   TRIG 75 11/24/2012 0227   HDL 43 11/24/2012 0227   CHOLHDL 2.7 11/24/2012 0227   VLDL 15 11/24/2012 0227   LDLCALC  59 11/24/2012 0227   Day 1 workup labs: Ammonia: 31 Vitamin B12: 270 Folate: 372 TSH: 0.910 A1c: 6.7 Ethanol: <11 UDS: negative  RPR: Reactive RPR Titer: 1:16 T.pallidum ab, IgG: >8.00 HIV antibody: non-reactive HIV-1 RNA quant: <20  LP CSF: Protein 25 mg/dL, Glucose 94 CSF Cell count: 1 WBC, 124 RBC, occasional lymph, rare monocyte CSF Gram stain: WBC (predominantly mononuclear), no organisms seen  CT Head wo contrast (9/20): FINDINGS:  There is moderate diffuse atrophy. There is no demonstrable mass,  hemorrhage, extra-axial fluid collection, or midline shift. There is  evidence of an old infarct in the inferior posterior left cerebellum  with encephalomalacia in this area. There is evidence of a prior  infarct at the junction of the the right temporal, occipital, and  parietal lobes. There is evidence of a prior infarct in the medial  right frontal lobe. There is widespread small vessel disease  throughout the centra semiovale bilaterally. There is evidence of a  prior small infarct involving portions of the anterior limb of the  left internal capsule and left globus pallidum. There is evidence of  a prior infarct in a portion of the head of the caudate nucleus on  the right as well as the anterior right lentiform nucleus. There is  evidence of prior small infarct in the anterior superior left  thalamus. No convincing acute infarct is seen on this study.  Bony calvarium appears intact. Mastoid air cells are clear.  IMPRESSION:  Atrophy with multiple prior infarcts and extensive small vessel  disease. No acute appearing infarct is appreciable on this study.  There is no hemorrhage or mass effect.  CXR 2v (9/20): Findings: Heart size appears upper normal. Thoracic aorta and  hilar contours are normal. Advanced emphysematous changes are  present. No focal airspace opacity, or pneumothorax. The left  costophrenic angle is partially excluded from the image. No  visible  pleural effusion. No acute osseous abnormality.  IMPRESSION:  Advanced emphysematous changes. No definite acute findings.  MRI/MRA Head (9/24): FINDINGS:  MRI HEAD FINDINGS  Acute and subacute left PCA territory infarcts are evident. Diffuse  cortical enhancement is evident within the more subacute portion  and. Remote encephalomalacia is evident within the inferior left  cerebellum and. Remote lacunar infarcts are present throughout the  basal ganglia bilaterally. Age advanced atrophy and extensive white  matter disease is present bilaterally. Flow is present in the major  intracranial arteries. The right vertebral artery is not visualized  and is likely hypoplastic. The globes and orbits are intact. The  paranasal sinuses and mastoid air cells are clear.  MRA HEAD FINDINGS  The internal carotid arteries demonstrate mild atherosclerotic  irregularity within the cavernous segments bilaterally. There is  approximately 50% stenosis is present in the proximal cavernous  segment on the left. The more distal vessel is within normal limits.  The A1 and M1 segments are normal. The anterior communicating artery  is patent. Moderate small vessel disease is evident within the  proximal MCA and ACA branch vessels bilaterally. The  right A2  segment is more affected than the left.  The left vertebral artery is the dominant vessel. The basilar artery  is within normal limits. Both posterior cerebral arteries originate  from the basilar tip. Moderate attenuation of PCA branch vessels is  evident bilaterally. The right vertebral artery is hypoplastic and  terminates at the PICA.  IMPRESSION:  MRI HEAD IMPRESSION  1. Acute and subacute left PCA territory infarcts within the medial  left occipital lobe.  2. Remote encephalomalacia of the inferior left cerebellum.  3. Atrophy and diffuse white matter disease is advanced for age.  This likely reflects the sequela of chronic microvascular ischemia.   4. Multiple bilateral remote lacunar infarcts of the basal ganglia.  MRA HEAD IMPRESSION  1. Moderate diffuse small vessel disease likely accounts for the  areas of encephalomalacia and acute/subacute ischemia.  2. 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  3. 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  Results/Tests Pending at Time of Discharge: CSF VDRL  Discharge Medications:    Medication List         amLODipine 10 MG tablet  Commonly known as:  NORVASC  Take 1 tablet (10 mg total) by mouth daily.     aspirin 81 MG EC tablet  Take 1 tablet (81 mg total) by mouth daily.     hydrochlorothiazide 12.5 MG capsule  Commonly known as:  MICROZIDE  Take 1 capsule (12.5 mg total) by mouth daily.     penicillin G procaine 600000 UNIT/ML Susp injection  Commonly known as:  WYCILLIN  Inject 4 mLs (2.4 Million Units total) into the muscle daily.     probenecid 500 MG tablet  Commonly known as:  BENEMID  Take 1 tablet (500 mg total) by mouth 4 (four) times daily.       Discharge Instructions: Please refer to Patient Instructions section of EMR for full details.  Patient was counseled important signs and symptoms that should prompt return to medical care, changes in medications, dietary instructions, activity restrictions, and follow up appointments.   Follow-Up Appointments:  Tawni Carnes, MD 11/29/2012, 12:37 PM PGY-1, [facility] Family Medicine
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
## 2012-11-28 NOTE — Progress Notes (Signed)
Attending Addendum  I examined the patient and discussed the assessment and plan with Dr. Waynetta Sandy. I have reviewed the note and agree.  The patient is mental status improve day by day. Today he is alert to person and place. He is still not oriented to time or situation. He is is more interested in his care and is asking more appropriate questions. Suspect that his acute psychoactive deliriums actually related to his acute posterior circulation strokes. LP was done but is not suggestive of an acute infectious process. Will await CSF culture and CSF VDRL. Disposition will most likely be to a skilled nursing facility for continued rehabilitation.    Dessa Phi, MD FAMILY MEDICINE TEACHING SERVICE

## 2012-11-28 NOTE — Clinical Social Work Note (Signed)
CSW notified sister Ivar Drape that bed offers for SNF would be given in the morning.   Roddie Mc, [place], [place], [phone]

## 2012-11-29 LAB — VDRL, CSF: VDRL Quant, CSF: REACTIVE

## 2012-11-29 MED ORDER — PENICILLIN G BENZATHINE 1200000 UNIT/2ML IM SUSP
2.4000 10*6.[IU] | INTRAMUSCULAR | Status: DC
  Filled 2012-11-29: qty 4

## 2012-11-29 MED ORDER — ASPIRIN 81 MG PO TBEC: 81.0000 mg | DELAYED_RELEASE_TABLET | Freq: Every day | ORAL | Status: DC

## 2012-11-29 MED ORDER — PENICILLIN G BENZATHINE 1200000 UNIT/2ML IM SUSP: 2.4000 10*6.[IU] | INTRAMUSCULAR | Status: DC

## 2012-11-29 MED ORDER — AMLODIPINE BESYLATE 10 MG PO TABS: 10.0000 mg | ORAL_TABLET | Freq: Every day | ORAL | Status: DC

## 2012-11-29 MED ORDER — PROBENECID 500 MG PO TABS
500.0000 mg | ORAL_TABLET | Freq: Four times a day (QID) | ORAL | Status: DC
  Filled 2012-11-29 (×4): qty 1

## 2012-11-29 MED ORDER — PROBENECID 500 MG PO TABS: 500.0000 mg | ORAL_TABLET | Freq: Four times a day (QID) | ORAL | Status: DC

## 2012-11-29 MED ORDER — PENICILLIN G PROCAINE 600000 UNIT/ML IM SUSP
2.4000 10*6.[IU] | Freq: Every day | INTRAMUSCULAR | Status: DC
  Filled 2012-11-29 (×2): qty 4

## 2012-11-29 MED ORDER — HYDROCHLOROTHIAZIDE 12.5 MG PO CAPS: 12.5000 mg | ORAL_CAPSULE | Freq: Every day | ORAL | Status: DC

## 2012-11-29 MED ORDER — PENICILLIN G PROCAINE 600000 UNIT/ML IM SUSP: 2.4000 10*6.[IU] | Freq: Every day | INTRAMUSCULAR | Status: AC
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
## 2012-11-29 NOTE — Progress Notes (Signed)
Patient discharge NURSING HOME per Md order.  Discharge instructions reviewed with patient and family.  Copies of all forms given and explained. Patient/family voiced understanding of all instructions.  Discharge in no acute distress. Barnett Applebaum, RN [facility], BSN, MSN

## 2012-11-29 NOTE — Progress Notes (Signed)
Family Medicine Teaching Service Daily Progress Note Intern Pager: [phone]  Patient name: Joshua Gonzales Medical record number: [id number] Date of birth: 1940-04-04 Age: 72 y.o. Gender: male  Primary Care Provider: Pcp Not In System Consultants: Neuro Code Status: Full, need to address  Pt Overview and Major Events to Date:  9/21 Admitted for confusion, overnight pulled out IV 9/24: LP and MRI/MRA  Assessment and Plan: 72 y.o M presenting with AMS X3 weeks, CT with no acute findings other than multiple old infarcts and extensive microvascular dx. 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 Also consider wernicke encephalopathy vs NPH with incontinence, dementia, and ataxia. Also consider vitamin deficiency vs infectious though less likely given afebrile and normal WBC. Tylenol and salicylate levels normal. UA and UDS negative, alcohol level and ammonia normal. Still continues to be confused this am Plan - Dosed thiamine 100mg  IV daily - Pt will need a PCP on discharge - CIWA 8, ativan x 1 yesterday at 7pm (agitated, pulling off EKG leads) - Continue to monitor though suspect much of the deficit may be permanent - Consulted neurology, "consider MRI brain" appreciate their recommendations - MRI: acute and subacute left PCA territory infarcts in medial left occipital lobe; encephalomalacia of interior left cerebellum - MRA: moderate diffuse small vessel disease, moderate stenosis of cavernous left ICA, hypoplastic right vertebral artery terminates at right PICA - PT/OT recs: SNF  - CSW: SNF search underway  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 Per ID spoke with [facility] lab and has no hx of being treated for syphilis here (but previously lived in [place]) to dx neurosyphilis would need CSF vdrl.  Plan - Appreciate ID consult and recommendations  "LP/VDRL, MRI, HIV RNA, urine gcct" - HIV quant negative - Urine GC still needs to be collected - LP does not appear to be infected: WBC 1, occasional lymphs, rare monocyte, glucose 94, protein 25. VDRL in process, CSF cx rare PMNs, no organisms (final result pending)  # Sinus arrhythmia, previous abnormal stress test - Prior NM study 2003 with small reversible perfusion defect anteroseptal portion of left ventricular apex, normal EF, ?mild hypokinesia. Tele last night with sinus arrhythmia (likely multifocal but without tach)  - trops negative - Started aspirin 81 - Risk stratification labs (lipid panel with calculated ASCVD risk 26.2% - consider starting mod- or high-intensity statin though balance with age and dementia) - F/u telemetry for any atrial fibrillation; unlikely to anticoagulate demented patient with ataxia  # HTN - Likely chronic, uncontrolled, smoking hx. S/p clonidine in ED. BPs last 24hrs 145-161/77-93. - Continue hydralazine PRN SBP >180 or DBP>100. - norvasc 10 - Start HCTZ 12.5mg  daily  # Metabolic alkalosis - bicarb 36 on admission, no other electrolyte imbalance. Pt stable.Resolved on recheck Plan -BMET prn  FEN/GI: Regular diet PPx: SQ heparin  Disposition: pending discharge to SNF   Subjective:  Doing well this morning, without any complaints. Starting to eat breakfast and much more alert. Oriented to [facility], still says 1999, president clinton>bush>with leading answers Obama.   Objective: Temp:  [97.4 F (36.3 C)-97.5 F (36.4 C)] 97.4 F (36.3 C) (09/25 2100) Pulse Rate:  [77-84] 77 (09/25 2100) Resp:  [18] 18 (09/25 2100) BP: (117-140)/(75-81) 140/81 mmHg (09/25 2100) SpO2:  [95 %] 95 % (09/25 2100) Physical Exam: General: NAD, sitting upright on side of bed  eating breakfast HEENT: PERRL, EOMI Cardiovascular: RRR, normal heart sounds Respiratory: CTAB, normal effort Abdomen: Soft, nontender, nondistended Extremities: No LE edema or  tenderness Neuro: Oriented x2. Spontaneous eye opening and extremity movement.  Laboratory:  Recent Labs Lab 11/23/12 1614 11/23/12 2255  WBC 5.4 6.1  HGB 15.7 14.5  HCT 43.8 41.2  PLT 209 200    Recent Labs Lab 11/23/12 1614  11/24/12 0227 11/25/12 0651 11/26/12 1127  NA 141  --  135 134* 135  K 3.5  --  3.0* 3.2* 3.4*  CL 98  --  97 99 98  CO2 36*  --  29 24 28   BUN 15  --  14 10 9   CREATININE 1.07  < > 0.91 0.76 0.96  CALCIUM 9.5  --  9.0 9.4 9.5  PROT 8.1  --  6.8  --   --   BILITOT 0.4  --  0.3  --   --   ALKPHOS 66  --  59  --   --   ALT 6  --  6  --   --   AST 22  --  15  --   --   GLUCOSE 102*  --  91 96 97  < > = values in this interval not displayed. Urinalysis: negative Lipid Panel     Component Value Date/Time   CHOL 117 11/24/2012 0227   TRIG 75 11/24/2012 0227   HDL 43 11/24/2012 0227   CHOLHDL 2.7 11/24/2012 0227   VLDL 15 11/24/2012 0227   LDLCALC 59 11/24/2012 0227   Troponin neg x 2 RPR titer: REACTIVE HIV: non-reactive Vit B12: 270 Folate: pending APAP level: <15.0 TSH: 0.910 A1c: 6.7 Ethanol 11mg /dL UDS: negative Salicylate level: 2.6  LP: Protein 25, Glucose 94 (H), RBCs 124 (H), WBC 1, occasional lymphs, rare monocytes CSF Cx: rare WBC (predominately PMN), no organisms  Imaging/Diagnostic Tests: Chest x-ray : emphysema  CT Head wo contrast: IMPRESSION:  Atrophy with multiple prior infarcts and extensive small vessel  disease. No acute appearing infarct is appreciable on this study.  There is no hemorrhage or mass effect.  MPRESSION:  MRI HEAD IMPRESSION  1. Acute and subacute left PCA territory infarcts within the medial  left occipital lobe.  2. Remote encephalomalacia of the inferior left cerebellum.  3. Atrophy and diffuse white matter disease is advanced for age.  This likely reflects the sequela of chronic microvascular ischemia.  4. Multiple bilateral remote lacunar infarcts of the basal ganglia.  MRA HEAD IMPRESSION   1. Moderate diffuse small vessel disease likely accounts for the  areas of encephalomalacia and acute/subacute ischemia.  2. 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  3. 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   Tawni Carnes, MD 11/29/2012, 6:33 AM PGY-1, [facility] FPTS Intern pager: [phone], text pages welcome

## 2012-11-29 NOTE — Discharge Summary (Signed)
Attending Addendum  I examined the patient and discussed the discharge plan with Dr. Joyner. I have reviewed the note and agree.    Athenia Rys, MD FAMILY MEDICINE TEACHING SERVICE   

## 2012-11-29 NOTE — Clinical Social Work Placement (Signed)
Clinical Social Work Department CLINICAL SOCIAL WORK PLACEMENT NOTE 11/29/2012  Patient:  Joshua Gonzales, Joshua Gonzales  Account Number:  [id number] Admit date:  11/23/2012  Clinical Social Worker:  Lavell Luster  Date/time:  11/28/2012 03:42 PM  Clinical Social Work is seeking post-discharge placement for this patient at the following level of care:   SKILLED NURSING   (*CSW will update this form in Epic as items are completed)   11/27/2012  Patient/family provided with [facility] Department of Clinical Social Work's list of facilities offering this level of care within the geographic area requested by the patient (or if unable, by the patient's family).  11/27/2012  Patient/family informed of their freedom to choose among providers that offer the needed level of care, that participate in Medicare, Medicaid or managed care program needed by the patient, have an available bed and are willing to accept the patient.  11/27/2012  Patient/family informed of [facility]' ownership interest in [facility], as well as of the fact that they are under no obligation to receive care at this facility.  PASARR submitted to EDS on 11/27/2012 PASARR number received from EDS on 11/27/2012  FL2 transmitted to all facilities in geographic area requested by pt/family on  11/28/2012 FL2 transmitted to all facilities within larger geographic area on 11/28/2012  Patient informed that his/her managed care company has contracts with or will negotiate with  certain facilities, including the following:     Patient/family informed of bed offers received:  11/29/2012 Patient chooses bed at [facility] Physician recommends and patient chooses bed at    Patient to be transferred to [facility] on  11/29/2012 Patient to be transferred to facility by Ambulance  The following physician request were entered in Epic:   Additional Comments: Per MD patient ready for DC  11/29/12. Patient DC to [facility] 11/29/12. Patient, sister, nurse, and facility notified of DC. Ambulance transport set up for patient to be taken to facility. Sister has completed paperwork at facility. CSW singing off.   Roddie Mc, [place], [place], [phone]

## 2012-11-29 NOTE — Progress Notes (Signed)
Critical CRITICAL VALUE ALERT  Critical value received:  VDRL reactive  Date of notification:  11/29/12  Time of notification:  1430 pm  Critical value read back: yes  Nurse who received alert:  Barnett Applebaum, RN BC, BSN, MSN  MD notified (1st page):  Family medicine  Time of first page:  1437 pm  MD notified (2nd page):  Time of second page:  Responding MD:  Family medicine  Time MD responded:  1437pm

## 2012-11-29 NOTE — Progress Notes (Addendum)
Attending Addendum  I examined the patient and discussed the assessment and plan with Dr. Waynetta Sandy. I have reviewed the note and agree.  The patient improves daily. He is alert and oriented to person and place this AM. He is now oriented to time or situation. CSF VDRL is positive. 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 patient with need f/u RPR with titer in 3 months.  Patient medically stable for discharge to SNF when a bed is available.     Dessa Phi, MD FAMILY MEDICINE TEACHING SERVICE

## 2012-11-29 NOTE — Clinical Social Work Placement (Signed)
Clinical Social Work Department CLINICAL SOCIAL WORK PLACEMENT NOTE 11/29/2012  Patient:  Joshua Gonzales, Joshua Gonzales  Account Number:  [id number] Admit date:  11/23/2012  Clinical Social Worker:  Lavell Luster  Date/time:  11/28/2012 03:42 PM  Clinical Social Work is seeking post-discharge placement for this patient at the following level of care:   SKILLED NURSING   (*CSW will update this form in Epic as items are completed)   11/27/2012  Patient/family provided with [facility] Department of Clinical Social Work's list of facilities offering this level of care within the geographic area requested by the patient (or if unable, by the patient's family).  11/27/2012  Patient/family informed of their freedom to choose among providers that offer the needed level of care, that participate in Medicare, Medicaid or managed care program needed by the patient, have an available bed and are willing to accept the patient.  11/27/2012  Patient/family informed of [facility]' ownership interest in [facility], as well as of the fact that they are under no obligation to receive care at this facility.  PASARR submitted to EDS on 11/27/2012 PASARR number received from EDS on 11/27/2012  FL2 transmitted to all facilities in geographic area requested by pt/family on  11/28/2012 FL2 transmitted to all facilities within larger geographic area on 11/28/2012  Patient informed that his/her managed care company has contracts with or will negotiate with  certain facilities, including the following:     Patient/family informed of bed offers received:  11/29/2012 Patient chooses bed at  Physician recommends and patient chooses bed at    Patient to be transferred to  on   Patient to be transferred to facility by   The following physician request were entered in Epic:   Additional Comments:  Roddie Mc, [place], [phone]
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
## 2012-11-29 NOTE — Care Management Note (Signed)
    Page 1 of 1   11/29/2012     2:40:16 PM   CARE MANAGEMENT NOTE 11/29/2012  Patient:  Joshua Gonzales, Joshua Gonzales   Account Number:  [id number]  Date Initiated:  11/29/2012  Documentation initiated by:  Letha Cape  Subjective/Objective Assessment:   dx acute encephalapathy  admit- lives with neice.     Action/Plan:   pt eval- recs snf   Anticipated DC Date:  11/29/2012   Anticipated DC Plan:  SKILLED NURSING FACILITY  In-house referral  Clinical Social Worker      DC Planning Services  CM consult      Choice offered to / List presented to:             Status of service:  Completed, signed off Medicare Important Message given?   (If response is "NO", the following Medicare IM given date fields will be blank) Date Medicare IM given:   Date Additional Medicare IM given:    Discharge Disposition:  SKILLED NURSING FACILITY  Per UR Regulation:  Reviewed for med. necessity/level of care/duration of stay  If discussed at Long Length of Stay Meetings, dates discussed:    Comments:  11/29/12 14:38 Letha Cape RN, BSN [phone] patient lives with neice, patient has been confused, AMS, per physical therapy recs snf.  Patient for dc to [facility] today. CSW following.

## 2012-12-01 LAB — CSF CULTURE W GRAM STAIN

## 2012-12-06 ENCOUNTER — Non-Acute Institutional Stay (SKILLED_NURSING_FACILITY): Payer: Medicare Other | Admitting: Internal Medicine

## 2012-12-06 DIAGNOSIS — F0391 Unspecified dementia with behavioral disturbance: Secondary | ICD-10-CM

## 2012-12-06 DIAGNOSIS — I10 Essential (primary) hypertension: Secondary | ICD-10-CM

## 2012-12-06 DIAGNOSIS — I69959 Hemiplegia and hemiparesis following unspecified cerebrovascular disease affecting unspecified side: Secondary | ICD-10-CM

## 2012-12-06 DIAGNOSIS — A529 Late syphilis, unspecified: Secondary | ICD-10-CM

## 2012-12-06 NOTE — Progress Notes (Signed)
Patient ID: Joshua Gonzales, male   DOB: 03-31-1940, 72 y.o.   MRN: [id number]  Facility; [facility] Chief complaint; admission to SNF post admit to [facility] Sept 20-Sept 26  History; this is a 72 year old man was admitted with encephalopathy for 3 weeks. CT scan of the head showed atrophy with multiple prior infarcts and extensive small vessel disease. His RPR was found to be reactive. He underwent an LP which had a positive CSF VDRL. An MRI and MRA were done that showed a left posterior cerebral artery infarct medial occipital lobe atrophy consistent with chronic microvascular ischemia and encephalomalacia of the inferior left cerebellum and bilateral Lacunar infarcts basal ganglia. 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 As a syphilis she was found to have an RPR titer of 1:16. /t, Pallidum IgG greater than 8 he is discharged on penicillin G. daily for 14 days. The patient was seen by infectious disease. Other workup included a normal ammonia, vitamin B12, folate, TSH, salicylic acid level. At the time of discharge the CD8 CSF VDRL was pending therefore the orders for daily vs. weekly [tertiary vs. latent syphilis]  Past medical history/problem list Left posterior cerebral artery [medial occipital lobe} CVA Vascular dementia Neurosyphilis. I note his VDRL in the CSF was positive therefore he will continue with daily [name] for his total 14 day course Sinus arrhythmia Hypertension   Medication; Norvasc 10 mg daily, enteric-coated aspirin 81 daily, hydrochlorothiazide 12.5 daily, penicillin G 2.4 million units intramuscularly daily for 14 days, probenecid 500 one tablet by mouth 4 times daily for 14 days  Social I have no information on this patient History   Social History  . Marital Status: Single    Spouse Name: N/A    Number of Children: N/A  . Years of Education: N/A   Occupational History  . Not on file.   Social History Main  Topics  . Smoking status: Current Every Day Smoker  . Smokeless tobacco: Not on file  . Alcohol Use: No  . Drug Use: Not on file  . Sexual Activity: Not on file   Other Topics Concern  . Not on file   Social History Narrative  . No narrative on file   Family history not available from any current sore   review of systems HEENT; no headache Respiratory no cough no sputum Cardiac no chest pain  Physical examination HEENT pupils equal and reactive Respiratory clear entry bilaterally Cardiac heart sounds are normal no murmurs Abdomen no liver no spleen no tenderness GU bladder is nondistended there is no costovertebral angle to Neurologic really no lateralizing findings Mental status; he is not orientated to the year thinks he is in [place]. Says he is 72 years old  Impression/plan #1 multiple cerebral infarction state with new CVA in the left medial occipital lobe comes out on enteric-coated aspirin 81 daily #2 tertiary syphilis as described above. He will complete his daily Pen G #3 hypertension on Norvasc #4 severe dementia which is probably vascular, whether he has meningovascular syphilis or not I am not certain. There was at least part of the history that suggested that he had deteriorated over the last 3 weeks "encephalopathy x3 weeks" the long-term prognosis for tertiary syphilis assuming this was the cause of his dementia is really unclear to me at this time

## 2012-12-31 ENCOUNTER — Non-Acute Institutional Stay (SKILLED_NURSING_FACILITY): Payer: Medicare Other | Admitting: Internal Medicine

## 2012-12-31 DIAGNOSIS — I6990 Unspecified sequelae of unspecified cerebrovascular disease: Secondary | ICD-10-CM

## 2012-12-31 DIAGNOSIS — I10 Essential (primary) hypertension: Secondary | ICD-10-CM

## 2013-01-15 ENCOUNTER — Ambulatory Visit: Payer: Medicare Other | Admitting: Family

## 2013-01-24 DIAGNOSIS — I10 Essential (primary) hypertension: Secondary | ICD-10-CM | POA: Insufficient documentation

## 2013-01-24 DIAGNOSIS — I6990 Unspecified sequelae of unspecified cerebrovascular disease: Secondary | ICD-10-CM | POA: Insufficient documentation

## 2013-01-24 NOTE — Progress Notes (Signed)
Patient ID: Joshua Gonzales, male   DOB: 1940-05-21, 72 y.o.   MRN: [id number]               PROGRESS NOTE  DATE: 12/31/2012   FACILITY: [facility]  LEVEL OF CARE: SNF (31)  Discharge Visit  CHIEF COMPLAINT:  Manage hypertension and CVA.    HISTORY OF PRESENT ILLNESS: I was requested by the social worker to perform face-to-face evaluation for discharge:  Patient was admitted to this facility for short-term rehabilitation after the patient's recent hospitalization.  Patient has completed SNF rehabilitation and therapy has cleared the patient for discharge.  The patient was hospitalized for encephalopathy secondary to CVA.  He is scheduled to be discharged today.     Reassessment of ongoing problem(s):  CVA: The patient's CVA remains stable.  Staff denies new neurologic symptoms such as numbness, tingling, weakness, speech difficulties or visual disturbances.  No complications reported from the medications currently being used.  Patient is a poor historian due to dementia.    HTN: Pt 's HTN remains stable.  Staff denies CP, sob, DOE, pedal edema, headaches, dizziness or visual disturbances.  No complications from the medications currently being used.  Last BP :   A recent blood pressure is not recorded.    PAST MEDICAL HISTORY : Reviewed.  No changes.  CURRENT MEDICATIONS: Reviewed per [facility]  REVIEW OF SYSTEMS:  Unobtainable due to dementia.Marland Kitchen    PHYSICAL EXAMINATION  GENERAL: no acute distress, normal body habitus NECK: supple, trachea midline, no neck masses, no thyroid tenderness, no thyromegaly RESPIRATORY: breathing is even & unlabored, BS CTAB CARDIAC: RRR, no murmur,no extra heart sounds, no edema GI: abdomen soft, normal BS, no masses, no tenderness, no hepatomegaly, no splenomegaly PSYCHIATRIC: the patient is alert & oriented to person, affect & behavior appropriate  LABS/RADIOLOGY:  None.    ASSESSMENT/PLAN:  CVA.  Continue aspirin.    Hypertension.   Continue current medications.    Vascular dementia.  Advanced.  Atrial fibrillation.  The patient is not an anticoagulation candidate.    I have filled out patient's discharge paperwork and written prescriptions.  Patient will receive home health PT, OT, ST, and nursing. DME provided:  None.    Total discharge time: Less than 30 minutes Discharge time involved coordination of the discharge process with Child psychotherapist, nursing staff and therapy department. Medical justification for home health services/DME verified.  CPT CODE: 40981

## 2013-01-28 ENCOUNTER — Ambulatory Visit: Payer: Medicare Other | Admitting: Family

## 2013-01-28 DIAGNOSIS — Z0289 Encounter for other administrative examinations: Secondary | ICD-10-CM

## 2013-04-19 ENCOUNTER — Encounter (HOSPITAL_COMMUNITY): Payer: Self-pay | Admitting: Emergency Medicine

## 2013-04-19 ENCOUNTER — Emergency Department (HOSPITAL_COMMUNITY)
Admission: EM | Admit: 2013-04-19 | Discharge: 2013-04-20 | Disposition: A | Discharge status: Home / Self Care | Payer: Medicare Other | Attending: Emergency Medicine | Admitting: Emergency Medicine

## 2013-04-19 DIAGNOSIS — IMO0002 Reserved for concepts with insufficient information to code with codable children: Secondary | ICD-10-CM | POA: Insufficient documentation

## 2013-04-19 DIAGNOSIS — F17200 Nicotine dependence, unspecified, uncomplicated: Secondary | ICD-10-CM | POA: Insufficient documentation

## 2013-04-19 DIAGNOSIS — T148XXA Other injury of unspecified body region, initial encounter: Secondary | ICD-10-CM | POA: Insufficient documentation

## 2013-04-19 DIAGNOSIS — Z8669 Personal history of other diseases of the nervous system and sense organs: Secondary | ICD-10-CM | POA: Insufficient documentation

## 2013-04-19 DIAGNOSIS — Z79899 Other long term (current) drug therapy: Secondary | ICD-10-CM | POA: Insufficient documentation

## 2013-04-19 DIAGNOSIS — F03918 Unspecified dementia, unspecified severity, with other behavioral disturbance: Secondary | ICD-10-CM | POA: Insufficient documentation

## 2013-04-19 DIAGNOSIS — F29 Unspecified psychosis not due to a substance or known physiological condition: Secondary | ICD-10-CM | POA: Insufficient documentation

## 2013-04-19 DIAGNOSIS — R296 Repeated falls: Secondary | ICD-10-CM | POA: Insufficient documentation

## 2013-04-19 DIAGNOSIS — F0390 Unspecified dementia without behavioral disturbance: Secondary | ICD-10-CM

## 2013-04-19 DIAGNOSIS — F0391 Unspecified dementia with behavioral disturbance: Secondary | ICD-10-CM | POA: Insufficient documentation

## 2013-04-19 DIAGNOSIS — I10 Essential (primary) hypertension: Secondary | ICD-10-CM | POA: Insufficient documentation

## 2013-04-19 DIAGNOSIS — Y939 Activity, unspecified: Secondary | ICD-10-CM | POA: Insufficient documentation

## 2013-04-19 DIAGNOSIS — Y929 Unspecified place or not applicable: Secondary | ICD-10-CM | POA: Insufficient documentation

## 2013-04-19 DIAGNOSIS — W19XXXA Unspecified fall, initial encounter: Secondary | ICD-10-CM

## 2013-04-19 DIAGNOSIS — S0081XA Abrasion of other part of head, initial encounter: Secondary | ICD-10-CM

## 2013-04-19 HISTORY — DX: Unspecified convulsions: R56.9

## 2013-04-19 MED ORDER — CEFAZOLIN SODIUM 1-5 GM-% IV SOLN
1.0000 g | Freq: Once | INTRAVENOUS | Status: AC
  Administered 2013-04-20: 1 g via INTRAVENOUS
  Filled 2013-04-19: qty 50

## 2013-04-19 MED ORDER — SODIUM CHLORIDE 0.9 % IV BOLUS (SEPSIS)
1000.0000 mL | Freq: Once | INTRAVENOUS | Status: AC
  Administered 2013-04-19: 1000 mL via INTRAVENOUS

## 2013-04-19 MED ORDER — ONDANSETRON HCL 4 MG/2ML IJ SOLN
INTRAMUSCULAR | Status: AC
  Filled 2013-04-19: qty 2

## 2013-04-19 MED ORDER — TETANUS-DIPHTH-ACELL PERTUSSIS 5-2.5-18.5 LF-MCG/0.5 IM SUSP
INTRAMUSCULAR | Status: AC
  Filled 2013-04-19: qty 0.5

## 2013-04-19 MED ORDER — TETANUS-DIPHTH-ACELL PERTUSSIS 5-2.5-18.5 LF-MCG/0.5 IM SUSP
0.5000 mL | Freq: Once | INTRAMUSCULAR | Status: AC
  Administered 2013-04-20: 0.5 mL via INTRAMUSCULAR

## 2013-04-20 ENCOUNTER — Emergency Department (HOSPITAL_COMMUNITY): Payer: Medicare Other

## 2013-04-20 LAB — CBC WITH DIFFERENTIAL/PLATELET
Basophils Absolute: 0 10*3/uL (ref 0.0–0.1)
Basophils Relative: 0 % (ref 0–1)
EOS PCT: 0 % (ref 0–5)
Eosinophils Absolute: 0 10*3/uL (ref 0.0–0.7)
HCT: 46.7 % (ref 39.0–52.0)
Hemoglobin: 16.6 g/dL (ref 13.0–17.0)
LYMPHS ABS: 2.8 10*3/uL (ref 0.7–4.0)
LYMPHS PCT: 32 % (ref 12–46)
MCH: 32.5 pg (ref 26.0–34.0)
MCHC: 35.5 g/dL (ref 30.0–36.0)
MCV: 91.6 fL (ref 78.0–100.0)
Monocytes Absolute: 0.5 10*3/uL (ref 0.1–1.0)
Monocytes Relative: 6 % (ref 3–12)
Neutro Abs: 5.5 10*3/uL (ref 1.7–7.7)
Neutrophils Relative %: 62 % (ref 43–77)
PLATELETS: 230 10*3/uL (ref 150–400)
RBC: 5.1 MIL/uL (ref 4.22–5.81)
RDW: 13.5 % (ref 11.5–15.5)
WBC: 8.9 10*3/uL (ref 4.0–10.5)

## 2013-04-20 LAB — COMPREHENSIVE METABOLIC PANEL
ALT: 8 U/L (ref 0–53)
AST: 18 U/L (ref 0–37)
Albumin: 4.1 g/dL (ref 3.5–5.2)
Alkaline Phosphatase: 82 U/L (ref 39–117)
BUN: 10 mg/dL (ref 6–23)
CALCIUM: 9.8 mg/dL (ref 8.4–10.5)
CO2: 21 meq/L (ref 19–32)
CREATININE: 1.07 mg/dL (ref 0.50–1.35)
Chloride: 95 mEq/L — ABNORMAL LOW (ref 96–112)
GFR, EST AFRICAN AMERICAN: 79 mL/min — AB (ref >90)
GFR, EST NON AFRICAN AMERICAN: 68 mL/min — AB (ref >90)
GLUCOSE: 181 mg/dL — AB (ref 70–99)
Potassium: 3.4 mEq/L — ABNORMAL LOW (ref 3.7–5.3)
SODIUM: 137 meq/L (ref 137–147)
Total Bilirubin: 0.4 mg/dL (ref 0.3–1.2)
Total Protein: 9.1 g/dL — ABNORMAL HIGH (ref 6.0–8.3)

## 2013-04-20 LAB — URINE MICROSCOPIC-ADD ON

## 2013-04-20 LAB — URINALYSIS, ROUTINE W REFLEX MICROSCOPIC
Bilirubin Urine: NEGATIVE
Glucose, UA: NEGATIVE mg/dL
Ketones, ur: NEGATIVE mg/dL
LEUKOCYTES UA: NEGATIVE
NITRITE: NEGATIVE
PROTEIN: 100 mg/dL — AB
SPECIFIC GRAVITY, URINE: 1.019 (ref 1.005–1.030)
Urobilinogen, UA: 0.2 mg/dL (ref 0.0–1.0)
pH: 5 (ref 5.0–8.0)

## 2013-04-20 LAB — CK: CK TOTAL: 150 U/L (ref 7–232)

## 2013-04-20 LAB — ETHANOL

## 2013-04-20 LAB — RAPID URINE DRUG SCREEN, HOSP PERFORMED
AMPHETAMINES: NOT DETECTED
Barbiturates: NOT DETECTED
Benzodiazepines: NOT DETECTED
Cocaine: NOT DETECTED
OPIATES: NOT DETECTED
Tetrahydrocannabinol: NOT DETECTED

## 2013-04-20 LAB — PROTIME-INR
INR: 1.01 (ref 0.00–1.49)
PROTHROMBIN TIME: 13.1 s (ref 11.6–15.2)

## 2013-04-20 LAB — CG4 I-STAT (LACTIC ACID)
LACTIC ACID, VENOUS: 1.5 mmol/L (ref 0.5–2.2)
Lactic Acid, Venous: 6.79 mmol/L — ABNORMAL HIGH (ref 0.5–2.2)

## 2013-04-20 LAB — APTT: APTT: 30 s (ref 24–37)

## 2013-04-20 LAB — TROPONIN I

## 2013-04-20 MED ORDER — ONDANSETRON HCL 4 MG/2ML IJ SOLN
4.0000 mg | Freq: Once | INTRAMUSCULAR | Status: AC
  Administered 2013-04-20: 4 mg via INTRAVENOUS

## 2013-04-20 MED ORDER — BACITRACIN ZINC 500 UNIT/GM EX OINT: 1.0000 "application " | TOPICAL_OINTMENT | Freq: Two times a day (BID) | CUTANEOUS | Status: DC

## 2013-04-20 MED ORDER — SODIUM CHLORIDE 0.9 % IV BOLUS (SEPSIS)
1000.0000 mL | Freq: Once | INTRAVENOUS | Status: AC
  Administered 2013-04-20: 1000 mL via INTRAVENOUS

## 2013-04-20 MED ORDER — ACETAMINOPHEN 325 MG PO TABS: 650.0000 mg | ORAL_TABLET | Freq: Four times a day (QID) | ORAL | Status: DC | PRN

## 2013-04-20 NOTE — Progress Notes (Signed)
Chaplain paged for level 2 Trauma. Chaplain arrived and found staff working on patient. No family present. ED secretary to notify chaplain when family arrives.   04/19/13 2342  Clinical Encounter Type  Visited With Patient not available;Health care provider  Visit Type Initial;ED;Trauma

## 2013-04-20 NOTE — ED Notes (Signed)
Moved to room B14, no changes, remains alert, NAD, calm, interactive, appropriate, [facility] 2nd NS IVF liter bolus infusing, VSS, BP elevated, family reports h/o htn.

## 2013-04-20 NOTE — ED Notes (Signed)
Pt resides with niece who cares for him.  Niece given all instructions and signed as receiving, understanding all.

## 2013-04-20 NOTE — Discharge Instructions (Signed)
Alzheimer Disease Alzheimer Disease (AD) is a mental disorder. It causes memory loss and loss of other mental functions, such as learning, thinking, solving problems, communicating, and completing tasks. The mental losses interfere with the ability to perform daily activities at work, at home, or in social situations. AD usually starts in the late 60s or early 88s but can start earlier in life (familial form). The mental changes caused by AD are permanent and worsen over time. As the illness progresses, the ability to do even the simplest things is lost. Survival with AD ranges from several years to as long as 20 years. CAUSES AD is caused by abnormally high levels of a protein (beta-amyloid) in the brain. This protein forms very small deposits within and around the brain's nerve cells. These deposits prevent the nerve cells from working properly. Experts are not certain what causes the beta-amyloid deposits in AD. RISK FACTORS The following major risk factors have been identified:  Increasing age.  Certain genetic variations, such as Down syndrome (trisomy 21). SYMPTOMS The earliest mental change in AD is mild memory loss of recent events, names, or phone numbers. Other symptoms at the beginning of AD include loss of objects, minor loss of vocabulary, and difficulty with complex tasks, such as paying bills or driving in unfamiliar locations. At this stage, you are still able to perform daily activities but need greater effort, more time, or memory aids. Other mental functions deteriorate as AD worsens. These changes slowly go from mild to severe. Symptoms at this stage include:  Difficulty remembering You may not be able to recall personal information such as your address and telephone number. You may become confused about the date, the season of the year, or your location.  Difficulty maintaining attention You may forget what you wanted to say during conversations and repeat what you have already  said.  Difficulty learning new information or tasks You may not remember what you read or the name of a new friend you met.  Difficulty counting or doing math You may have difficulty with complex math problems. You may make mistakes in paying bills or managing your checkbook.  Poor reasoning and judgment You may make poor decisions or not dress right for the weather.  Difficulty communicating You may have regular difficulty remembering words, naming objects, expressing yourself clearly, or writing sentences that make sense.  Difficulty performing familiar daily activities You may get lost driving in familiar locations or need help eating, bathing, dressing, grooming, or using the toilet. You may have difficulty maintaining bladder or bowel control.  Difficulty recognizing familiar faces You may confuse family members or close friends with one another. You may not recognize a close relative or may mistake strangers for family. AD also may cause changes in personality and behavior. These changes include loss of interest or motivation, social withdrawal, anxiety, difficulty sleeping, uncharacteristic anger or combativeness, a false belief that someone is trying to harm you (paranoia), seeing things that are not real (hallucinations), or agitation. Confusion and disruptive behavior are often worse at night and may be triggered by changes in the environment or acute medical issues. DIAGNOSIS  AD is diagnosed through an assessment by your health care provider. During this assessment, your health care provider will do the following:  Ask you and your family, friends, or caregiver questions about your symptoms, their frequency, their duration and progression, and the effect they are having on your life.  Ask questions about your personal and family medical history and use  of alcohol or drugs, including prescription medicine.  Perform a physical exam and order blood tests and brain imaging exams. Your  health care provider may refer you to a specialist for detailed evaluation of your mental functions (neuropsychological testing).  Many different brain disorders, medical conditions, and certain substances can cause symptoms that resemble AD symptoms. These must be ruled out before AD can be diagnosed. If AD is diagnosed, it will be considered either "possible" or "probable" AD. "Possible" AD means that your symptoms are typical of AD and no other disorder is causing them. "Probable" AD means that you also have a family history of AD or genetic test results that support the diagnosis. Certain tests, mostly used in research studies, are highly specific for AD.  TREATMENT  There is currently no cure for AD. The goals of treatment are to:  Slow down the progression of the disease.  Preserve mental function as long as possible.  Manage behavioral symptoms.  Make life easier for the person with AD and their caregivers. The following treatment options are available:  Medicine Certain medicines may help slow memory loss by changing the level of certain chemicals in the brain. Medicine may also help with behavioral symptoms.  Talk therapy Talk therapy provides education, support, and memory aids for people with AD. It is most effective in the early stages of the illness.  Caregiving Caregivers may be family members, friends, or trained medical professionals. They help the person with AD with daily life activities. Caregiving may take place at home or at a nursing facility.  Family support groups These provide education, emotional support, and information about community resources to family members who are taking care of the person with AD. Document Released: 11/02/2003 Document Revised: 10/23/2012 Document Reviewed: 06/28/2012 [facility] Patient Information 2014 [place], [place].  Contusion A contusion is a deep bruise. Contusions are the result of an injury that caused bleeding under the skin. The  contusion may turn blue, purple, or yellow. Minor injuries will give you a painless contusion, but more severe contusions may stay painful and swollen for a few weeks.  CAUSES  A contusion is usually caused by a blow, trauma, or direct force to an area of the body. SYMPTOMS   Swelling and redness of the injured area.  Bruising of the injured area.  Tenderness and soreness of the injured area.  Pain. DIAGNOSIS  The diagnosis can be made by taking a history and physical exam. An X-ray, CT scan, or MRI may be needed to determine if there were any associated injuries, such as fractures. TREATMENT  Specific treatment will depend on what area of the body was injured. In general, the best treatment for a contusion is resting, icing, elevating, and applying cold compresses to the injured area. Over-the-counter medicines may also be recommended for pain control. Ask your caregiver what the best treatment is for your contusion. HOME CARE INSTRUCTIONS   Put ice on the injured area.  Put ice in a plastic bag.  Place a towel between your skin and the bag.  Leave the ice on for 15-20 minutes, 03-04 times a day.  Only take over-the-counter or prescription medicines for pain, discomfort, or fever as directed by your caregiver. Your caregiver may recommend avoiding anti-inflammatory medicines (aspirin, ibuprofen, and naproxen) for 48 hours because these medicines may increase bruising.  Rest the injured area.  If possible, elevate the injured area to reduce swelling. SEEK IMMEDIATE MEDICAL CARE IF:   You have increased bruising or swelling.  You have pain that is getting worse.  Your swelling or pain is not relieved with medicines. MAKE SURE YOU:   Understand these instructions.  Will watch your condition.  Will get help right away if you are not doing well or get worse. Document Released: 11/30/2004 Document Revised: 05/15/2011 Document Reviewed: 12/26/2010 [facility] Patient Information  2014 [place], [place].

## 2013-04-20 NOTE — ED Notes (Signed)
[facility], NAD, calm, interactive, skin cool and dry, skin warmer, shivering less, urine sent to lab (urinal used), CT & xray notified "pt ready".

## 2013-04-20 NOTE — ED Notes (Signed)
Called into pt room.  Pt had removed own IV while saline was infusing, placed IV in trash.  Blood throughout room.  Niece states she fell asleep and when she woke up found the mess.  Pt regretful.

## 2013-04-20 NOTE — ED Notes (Signed)
Dr Jeraldine LootsLockwood given a copy of lactic acid results 6.79
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
## 2013-09-09 ENCOUNTER — Encounter (HOSPITAL_COMMUNITY): Payer: Self-pay | Admitting: Emergency Medicine

## 2013-09-09 ENCOUNTER — Emergency Department (HOSPITAL_COMMUNITY): Payer: Medicare Other

## 2013-09-09 DIAGNOSIS — F0280 Dementia in other diseases classified elsewhere without behavioral disturbance: Secondary | ICD-10-CM | POA: Insufficient documentation

## 2013-09-09 DIAGNOSIS — Z87891 Personal history of nicotine dependence: Secondary | ICD-10-CM | POA: Insufficient documentation

## 2013-09-09 DIAGNOSIS — I10 Essential (primary) hypertension: Secondary | ICD-10-CM | POA: Insufficient documentation

## 2013-09-09 DIAGNOSIS — Z79899 Other long term (current) drug therapy: Secondary | ICD-10-CM | POA: Insufficient documentation

## 2013-09-09 DIAGNOSIS — G309 Alzheimer's disease, unspecified: Secondary | ICD-10-CM | POA: Insufficient documentation

## 2013-09-09 LAB — BASIC METABOLIC PANEL
Anion gap: 12 (ref 5–15)
BUN: 14 mg/dL (ref 6–23)
CO2: 28 meq/L (ref 19–32)
Calcium: 9.1 mg/dL (ref 8.4–10.5)
Chloride: 98 mEq/L (ref 96–112)
Creatinine, Ser: 1.03 mg/dL (ref 0.50–1.35)
GFR calc Af Amer: 83 mL/min — ABNORMAL LOW (ref >90)
GFR, EST NON AFRICAN AMERICAN: 72 mL/min — AB (ref >90)
GLUCOSE: 107 mg/dL — AB (ref 70–99)
POTASSIUM: 4 meq/L (ref 3.7–5.3)
SODIUM: 138 meq/L (ref 137–147)

## 2013-09-09 LAB — CBC
HCT: 42.2 % (ref 39.0–52.0)
HEMOGLOBIN: 14.4 g/dL (ref 13.0–17.0)
MCH: 31.2 pg (ref 26.0–34.0)
MCHC: 34.1 g/dL (ref 30.0–36.0)
MCV: 91.5 fL (ref 78.0–100.0)
PLATELETS: 185 10*3/uL (ref 150–400)
RBC: 4.61 MIL/uL (ref 4.22–5.81)
RDW: 13.7 % (ref 11.5–15.5)
WBC: 6.2 10*3/uL (ref 4.0–10.5)

## 2013-09-09 LAB — I-STAT TROPONIN, ED: TROPONIN I, POC: 0 ng/mL (ref 0.00–0.08)

## 2013-09-09 NOTE — ED Notes (Signed)
Alzheimer's patient presents c/o chest pain at home per family. Pt denies pain at this time. Denies nausea, dizziness and SOB. Per family he was asking for medicine for his chest and not as active as usual.

## 2013-09-10 ENCOUNTER — Encounter (HOSPITAL_COMMUNITY): Payer: Self-pay | Admitting: Emergency Medicine

## 2013-09-10 ENCOUNTER — Emergency Department (HOSPITAL_COMMUNITY)
Admission: EM | Admit: 2013-09-10 | Discharge: 2013-09-10 | Disposition: A | Discharge status: Home / Self Care | Payer: Medicare Other | Attending: Emergency Medicine | Admitting: Emergency Medicine

## 2013-09-10 DIAGNOSIS — I10 Essential (primary) hypertension: Secondary | ICD-10-CM

## 2013-09-10 HISTORY — DX: Dementia in other diseases classified elsewhere, unspecified severity, without behavioral disturbance, psychotic disturbance, mood disturbance, and anxiety: F02.80

## 2013-09-10 HISTORY — DX: Alzheimer's disease, unspecified: G30.9

## 2013-09-10 LAB — I-STAT TROPONIN, ED: Troponin i, poc: 0 ng/mL (ref 0.00–0.08)

## 2013-09-10 MED ORDER — AMLODIPINE BESYLATE 5 MG PO TABS: 10.0000 mg | ORAL_TABLET | Freq: Every day | ORAL | Status: DC

## 2013-09-10 MED ORDER — AMLODIPINE BESYLATE 5 MG PO TABS
2.5000 mg | ORAL_TABLET | Freq: Every day | ORAL | Status: DC
  Administered 2013-09-10: 2.5 mg via ORAL
  Filled 2013-09-10: qty 1

## 2013-09-10 MED ORDER — ALUM & MAG HYDROXIDE-SIMETH 200-200-20 MG/5ML PO SUSP
30.0000 mL | Freq: Once | ORAL | Status: AC
  Administered 2013-09-10: 30 mL via ORAL
  Filled 2013-09-10: qty 30

## 2013-09-10 NOTE — ED Notes (Signed)
Pt with hx of dementia. Pt's niece states that he has been reporting chest pain and has high BP, and that is why she brought him in.

## 2013-09-10 NOTE — Discharge Instructions (Signed)
°Emergency Department Resource Guide °1) Find a Doctor and Pay Out of Pocket °Although you won't have to find out who is covered by your insurance plan, it is a good idea to ask around and get recommendations. You will then need to call the office and see if the doctor you have chosen will accept you as a new patient and what types of options they offer for patients who are self-pay. Some doctors offer discounts or will set up payment plans for their patients who do not have insurance, but you will need to ask so you aren't surprised when you get to your appointment. ° °2) Contact Your Local Health Department °Not all health departments have doctors that can see patients for sick visits, but many do, so it is worth a call to see if yours does. If you don't know where your local health department is, you can check in your phone book. The CDC also has a tool to help you locate your state's health department, and many state websites also have listings of all of their local health departments. ° °3) Find a Walk-in Clinic °If your illness is not likely to be very severe or complicated, you may want to try a walk in clinic. These are popping up all over the country in pharmacies, drugstores, and shopping centers. They're usually staffed by nurse practitioners or physician assistants that have been trained to treat common illnesses and complaints. They're usually fairly quick and inexpensive. However, if you have serious medical issues or chronic medical problems, these are probably not your best option. ° °No Primary Care Doctor: °- Call [facility] at  832-8000 - they can help you locate a primary care doctor that  accepts your insurance, provides certain services, etc. °- Physician Referral Service- 1-800-533-3463 ° °Chronic Pain Problems: °Organization         Address  Phone   Notes  °[facility]  (336) 297-2271 Patients need to be referred by their primary care doctor.  ° °Medication  Assistance: °Organization         Address  Phone   Notes  °[place] [facility] [address] °[place], [place] [address] (336) 641-8030 --Must be a resident of [place] °-- Must have NO insurance coverage whatsoever (no Medicaid/ Medicare, etc.) °-- The pt. MUST have a primary care doctor that directs their care regularly and follows them in the community °  °[facility]  (866) 331-1348   °[employer]  (888) 892-1162   ° °Agencies that provide inexpensive medical care: °Organization         Address  Phone   Notes  °[facility]  (336) 832-8035   °[facility]    (336) 832-7272   °[facility] [address] °[place], [place] [address] (336) 832-4777   °[facility] [address], °[place] (336) 271-4999   °[facility]    (336) 373-0678   °[facility]    (336) 272-1050   °[facility] ° [address], [place] Phone:  (336) 832-4444, Fax:  (336) 832-4440 Hours of Operation:  9 am - 6 pm, M-F.  Also accepts Medicaid/Medicare and self-pay.  °[facility] ° [address],  Phone: (336) 832-3150, Fax: (336) 832-3151. Hours of Operation:  8:30 am - 5:30 pm, M-F.  Also accepts Medicaid and self-pay.  °[facility] [place] [address], [place] Phone: (336) 878-6027   °[facility] [address], [place], [place] (336)723-1848, Ext. 123 Mondays & Thursdays: 7-9 AM.  First 15 patients are seen on a first come, first serve basis. °  ° °Medicaid-accepting [place] Providers: ° °Organization         Address  Phone   Notes  °[facility] [address], [place] (336) 641-2100 Also accepts self-pay patients.  °[facility] [address], [place] ° (336) 856-9996   °[facility] [address], [place]  (336) 288-8857   °[facility] [address] [place] [address], [place] (336) 299-7000   °Veita Bland [address], [place]  ° (336) 373-1557 Only accepts  Access Medicaid patients after they have their name applied to their card.  ° °Self-Pay (no insurance) in [place]: ° °Organization         Address  Phone   Notes  °Sickle Cell Patients, [facility] [address], [place] (336) 832-1970   °[facility] [address], [place] (336) 832-4400   ° [facility] ° [address], [place] (336) 992-4800   °[facility]/Dr. Osei-Bonsu ° [address] [place] [address], [place] or [address], [place] (336) [phone] Phone number for both [place] and [place] locations is the same.  °[facility] [address], [place] (336) 299-0000   °[facility] [address] [place] [address], [place] or [address] (336) 852-7530 °(336) 878-2260   °[facility] [address], [place] (336) 350-1642, phone; (336) 294-5005, fax Sees patients 1st and 3rd Saturday of every month.  Must not qualify for public or private insurance (i.e. Medicaid, Medicare, [place] Health Choice, Veterans' Benefits) • Household income should be no more than 200% of the poverty level •The clinic cannot treat you if you are pregnant or think you are pregnant • Sexually transmitted diseases are not treated at the clinic.  ° ° °Dental Care: °Organization         Address  Phone  Notes  °[place] [facility] [address], [place] (336) 641-6152 Accepts children up to age 21 who are enrolled in Medicaid or [place] Health Choice; pregnant women with a Medicaid card; and children who have applied for Medicaid or [place] Health Choice, but were declined, whose parents can pay a reduced fee at time of service.  °[place]  [facility] [place]  [address], [place] (336) 641-7733 Accepts children up to age 21 who are enrolled in Medicaid or [place] Health Choice; pregnant women with a Medicaid card; and children who have applied for Medicaid or  Health Choice, but were declined, whose parents can pay a reduced fee at time of service.  °[facility] ° [address], [place] (336) 641-4533 Patients are seen by appointment only. Walk-ins are not accepted. [facility] will see patients 18 years of age and older. °Monday - Tuesday (8am-5pm) °Most Wednesdays (8:30-5pm) °$30 per visit, cash only  °[facility] ° [address], [place] (336) 641-4533 Patients are seen by appointment only. Walk-ins are not accepted. [facility] will see patients 18 years of age and older. °One   Wednesday Evening (Monthly: Volunteer Based).  $30 per visit, cash only  °[facility]  (919) 537-3737 for adults; Children under age 4, call Graduate Pediatric Dentistry at (919) [phone]. Children aged 4-14, please call (919) [phone] to request a pediatric application. ° Dental services are provided in all areas of dental care including fillings, crowns and bridges, complete and partial dentures, implants, gum treatment, root canals, and extractions. Preventive care is also provided. Treatment is provided to both adults and children. °Patients are selected via a lottery and there is often a waiting list. °  °[facility] [address], °[place] ° (336) 763-8833 [email or web address] °  °[facility] [address], [place], [place] (336)723-1848, Ext. 123 Second and Fourth Thursday of each month, opens at 6:30 AM; Clinic ends at 9 AM.  Patients are seen on a first-come first-served basis, and a limited number are seen during each clinic.  ° °[facility] ° [address], [place], [place] (336) 723-7904    Eligibility Requirements °You must have lived in [place], [place], or [place] counties for at least the last three months. °  You cannot be eligible for state or federal sponsored healthcare insurance, including Veterans Administration, Medicaid, or Medicare. °  You generally cannot be eligible for healthcare insurance through your employer.  °  How to apply: °Eligibility screenings are held every Tuesday and Wednesday afternoon from 1:00 pm until 4:00 pm. You do not need an appointment for the interview!  °[facility] [address], [place], [place] 336-631-2330   °[facility] [facility]  336-342-8273   °[facility]  336-703-3100   °[facility]  336-570-6415   ° °Behavioral Health Resources in the Community: °Intensive Outpatient Programs °Organization         Address  Phone  Notes  °[facility] [address], [place], [place] 336-878-6098   °[facility] [address], [place], [place] 336-832-9800   °[facility] [address], [place], [place] ° 336-882-2125   °[facility] [address],  °[place], [place] 1-800-853-5163 or 336-641-4981   °Substance Abuse Resources °Organization         Address  Phone  Notes  °[facility]  336-882-2125   °[facility]  336-784-9470   °[facility]  336-285-9073   °[facility]  336-845-3988   °Residential & Outpatient Substance Abuse Program  1-800-659-3381   °Psychological Services °Organization         Address  Phone  Notes  °[facility]  336- 832-9600   °[facility]  336- 378-7881   °[facility] [address], [place] 1-800-853-5163 or 336-641-4981   ° °Mobile Crisis Teams °Organization         Address  Phone  Notes  °[facility], Mobile Crisis Care Unit  1-877-626-1772   °Assertive °[facility] ° [address]  [place], [place] 336-834-9664   °Sharon DeEsch [address] °[place] 336-554-5454   ° °Self-Help/Support Groups °Organization         Address  Phone             Notes  °[facility]  - variety of support groups  336- 373-1402 Call for more information  °Narcotics Anonymous (NA), [facility] [address], °[place]  2 meetings at this location  ° °  Residential Treatment Programs °Organization         Address  Phone  Notes  °[facility] [address],    °[place]  1-866-801-8205   °[facility] ° [address], [place], [place] 704-293-8524   °[facility] [address], [place] 336-845-3988 Admissions: 8am-3pm M-F  °[facility] [address]    °[place], [place] 336-841-1104   °[facility] [address], [place], [place] 336-379-7146   °[facility] [address]  °[place], [place] 336-285-9073   °[facility] Intensive Outpatient [address], [place], [place] 336-852-3033   °[facility] [address]  °[place], [place] 1-877-615-2722 or 336-784-9470   °[facility] [address] [place], [place] 336-227-7417 Accepts Medicaid  °[facility] [address]  ° [place] 1-800-659-3381 Substance Abuse/Addiction Treatment  ° °[place] Behavioral Health Resources °Organization         Address  Phone  Notes  °[facility]  (888) 581-9988   °Julie Brannon, PhD [address] [place], [place]   (336) 349-5553 or (336) 951-0000   °[facility]   [address] °[place], [place] (336) 349-4454   °[facility] [address], [place], [place] (336) 342-8316 Insurance/Medicaid/sponsorship through [facility]  °[facility] [address]                                    [place], [place] (336) 342-8316 Therapy/tele-psych/case    °[facility] [address]  ° [place],  (336) 349-2233    °Dr. Arfeen  (336) 349-4544   °[facility] [place]  [employer] [place] [facility] 1) [address], [place] °2) [address], [place] °3)  [address], [place] (336) 349-3220 °(336) 342-7768 ° °(336) 342-8140   °[place] [facility] (336) 342-1394 or (336) 342-3537 (After Hours)    ° ° °

## 2013-09-10 NOTE — ED Provider Notes (Signed)
CSN: [id number]     Arrival date & time 09/09/13  2154 History   First MD Initiated Contact with Patient 09/10/13 0030     Chief Complaint  Patient presents with  . Chest Pain     (Consider location/radiation/quality/duration/timing/severity/associated sxs/prior Treatment) Patient is a 73 y.o. male presenting with chest pain. The history is provided by a caregiver. The history is limited by the condition of the patient. No language interpreter was used.  Chest Pain Chest pain location: upper chest. Pain severity:  Moderate Onset quality:  Gradual Timing:  Constant Progression:  Resolved Chronicity:  Recurrent Context: not breathing   Relieved by:  Nothing Worsened by:  Nothing tried Ineffective treatments:  None tried Associated symptoms: no abdominal pain     Past Medical History  Diagnosis Date  . Hypertension   . Seizures   . Alzheimer disease    History reviewed. No pertinent past surgical history. History reviewed. No pertinent family history. History  Substance Use Topics  . Smoking status: Former Games developermoker  . Smokeless tobacco: Not on file  . Alcohol Use: Yes    Review of Systems  Unable to perform ROS Cardiovascular: Positive for chest pain.  Gastrointestinal: Negative for abdominal pain.      Allergies  Review of patient's allergies indicates no known allergies.  Home Medications   Prior to Admission medications   Medication Sig Start Date End Date Taking? Authorizing Provider  acetaminophen (TYLENOL) 325 MG tablet Take 650 mg by mouth every 6 (six) hours as needed for mild pain.   Yes Historical Provider, MD  amLODipine (NORVASC) 10 MG tablet Take 1 tablet (10 mg total) by mouth daily. 11/29/12  Yes Tawni CarnesAndrew Wight, MD  hydrochlorothiazide (MICROZIDE) 12.5 MG capsule Take 1 capsule (12.5 mg total) by mouth daily. 11/29/12  Yes Tawni CarnesAndrew Wight, MD   BP 176/99  Pulse 45  Temp(Src) 98.5 F (36.9 C) (Oral)  Resp 21  SpO2 99% Physical Exam  Constitutional:  He appears well-developed and well-nourished. No distress.  HENT:  Head: Normocephalic and atraumatic.  Mouth/Throat: Oropharynx is clear and moist.  Eyes: Conjunctivae are normal. Pupils are equal, round, and reactive to light.  Neck: Normal range of motion. Neck supple.  Cardiovascular: Normal rate, regular rhythm and intact distal pulses.   Pulmonary/Chest: Effort normal and breath sounds normal. No respiratory distress. He has no wheezes. He has no rales.  Abdominal: Soft. Bowel sounds are normal. There is no tenderness. There is no rebound and no guarding.  Musculoskeletal: Normal range of motion.  Neurological: He is alert. He has normal reflexes.  Skin: Skin is warm and dry.  Psychiatric: He has a normal mood and affect.    ED Course  Procedures (including critical care time) Labs Review Labs Reviewed  BASIC METABOLIC PANEL - Abnormal; Notable for the following:    Glucose, Bld 107 (*)    GFR calc non Af Amer 72 (*)    GFR calc Af Amer 83 (*)    All other components within normal limits  CBC  I-STAT TROPOININ, ED  Rosezena SensorI-STAT TROPOININ, ED    Imaging Review Dg Chest 2 View  09/09/2013   CLINICAL DATA:  Chest pain.  EXAM: CHEST  2 VIEW  COMPARISON:  04/20/2013  FINDINGS: Normal heart size and pulmonary vascularity. Diffuse emphysematous changes and fibrosis in the lungs. 12 mm vague nodular opacity in the left upper lung was not present previously. Consider CT to exclude pulmonary nodule. No focal airspace disease or consolidation in  the lungs. No blunting of costophrenic angles. No pneumothorax.  IMPRESSION: Emphysema and fibrosis in the lungs. Suggestion of developing nodule in the left apex. No evidence of active infiltration.   Electronically Signed   By: Burman NievesWilliam  Stevens M.D.   On: 09/09/2013 23:43     EKG Interpretation   Date/Time:  Tuesday September 09 2013 22:09:13 EDT Ventricular Rate:  71 PR Interval:  160 QRS Duration: 90 QT Interval:  386 QTC Calculation: 419 R  Axis:   88 Text Interpretation:  Sinus rhythm with Premature atrial complexes with  junctional escape complexes Confirmed by [facility]  MD, Morene AntuAPRIL ([id number])  on 09/10/2013 2:17:19 AM      MDM   Final diagnoses:  None   Will give outpatient resources to family for new doctor,  Rules out for MI doubt ACS.  Suspect GERd as patient has lots of gurgling in the epigastrum.      Jasmine AweApril K Shey Yott-Rasch, MD 09/10/13 [id number]

## 2013-10-06 ENCOUNTER — Emergency Department (HOSPITAL_COMMUNITY)
Admission: EM | Admit: 2013-10-06 | Discharge: 2013-10-08 | Disposition: A | Discharge status: Home / Self Care | Payer: Medicare Other | Attending: Emergency Medicine | Admitting: Emergency Medicine

## 2013-10-06 ENCOUNTER — Encounter (HOSPITAL_COMMUNITY): Payer: Self-pay | Admitting: Emergency Medicine

## 2013-10-06 DIAGNOSIS — F0280 Dementia in other diseases classified elsewhere without behavioral disturbance: Secondary | ICD-10-CM | POA: Insufficient documentation

## 2013-10-06 DIAGNOSIS — L988 Other specified disorders of the skin and subcutaneous tissue: Secondary | ICD-10-CM | POA: Insufficient documentation

## 2013-10-06 DIAGNOSIS — Z8669 Personal history of other diseases of the nervous system and sense organs: Secondary | ICD-10-CM | POA: Diagnosis not present

## 2013-10-06 DIAGNOSIS — G309 Alzheimer's disease, unspecified: Principal | ICD-10-CM | POA: Insufficient documentation

## 2013-10-06 DIAGNOSIS — R4182 Altered mental status, unspecified: Secondary | ICD-10-CM | POA: Diagnosis present

## 2013-10-06 DIAGNOSIS — I10 Essential (primary) hypertension: Secondary | ICD-10-CM | POA: Insufficient documentation

## 2013-10-06 DIAGNOSIS — Z87891 Personal history of nicotine dependence: Secondary | ICD-10-CM | POA: Diagnosis not present

## 2013-10-06 DIAGNOSIS — Z79899 Other long term (current) drug therapy: Secondary | ICD-10-CM | POA: Diagnosis not present

## 2013-10-06 LAB — I-STAT CHEM 8, ED
BUN: 13 mg/dL (ref 6–23)
Calcium, Ion: 1.25 mmol/L (ref 1.13–1.30)
Chloride: 104 mEq/L (ref 96–112)
Creatinine, Ser: 1.3 mg/dL (ref 0.50–1.35)
GLUCOSE: 114 mg/dL — AB (ref 70–99)
HCT: 50 % (ref 39.0–52.0)
HEMOGLOBIN: 17 g/dL (ref 13.0–17.0)
Potassium: 4.4 mEq/L (ref 3.7–5.3)
SODIUM: 143 meq/L (ref 137–147)
TCO2: 26 mmol/L (ref 0–100)

## 2013-10-06 MED ORDER — AMLODIPINE BESYLATE 5 MG PO TABS
5.0000 mg | ORAL_TABLET | Freq: Once | ORAL | Status: AC
  Administered 2013-10-06: 5 mg via ORAL
  Filled 2013-10-06: qty 1

## 2013-10-06 MED ORDER — TUBERCULIN PPD 5 UNIT/0.1ML ID SOLN
5.0000 [IU] | Freq: Once | INTRADERMAL | Status: DC
  Administered 2013-10-06: 5 [IU] via INTRADERMAL
  Filled 2013-10-06: qty 0.1

## 2013-10-06 MED ORDER — AMLODIPINE BESYLATE 5 MG PO TABS
5.0000 mg | ORAL_TABLET | Freq: Every day | ORAL | Status: DC
  Administered 2013-10-06 – 2013-10-08 (×3): 5 mg via ORAL
  Filled 2013-10-06 (×5): qty 1

## 2013-10-06 NOTE — ED Notes (Signed)
Dr. Jacubowitz at bedside 
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
## 2013-10-06 NOTE — ED Notes (Signed)
CSW spoke with pt's nieces, Shelbie ProctorLucie Griffith, and Marny LowensteinWendy Patterson re: ALF placement for pt.  Family is unable to care for pt at home anymore due to his wandering.  Family has secured Medicaid for pt, and had been trying to place him from home, but had been unsuccessful. Family has no preference of a facility, but [facility].  CSW will begin bed search for pt and facilitate pt tx to ALF from the ED.  MD and RN aware of need for TB skin test prior to d/c.

## 2013-10-06 NOTE — ED Notes (Signed)
Attempted blood draw, unsuccessful. 
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
## 2013-10-06 NOTE — ED Provider Notes (Signed)
CSN: [id number]     Arrival date & time 10/06/13  1414 History   First MD Initiated Contact with Patient 10/06/13 1635     Chief Complaint  Patient presents with  . Altered Mental Status     (Consider location/radiation/quality/duration/timing/severity/associated sxs/prior Treatment) HPI  Level V caveat dementia history is obtained from nurses notes. Patient was brought here by police reportedly wanted away from his niece's house. Patient asymptomatic. Denies pain except for pain at right foot, plantar surface at site of blister. Past Medical History  Diagnosis Date  . Hypertension   . Seizures   . Alzheimer disease    History reviewed. No pertinent past surgical history. History reviewed. No pertinent family history. History  Substance Use Topics  . Smoking status: Former Games developermoker  . Smokeless tobacco: Not on file  . Alcohol Use: Yes   denies alcohol use today  Review of Systems  Unable to perform ROS: Dementia  Skin: Positive for wound.       Blister on right foot      Allergies  Review of patient's allergies indicates no known allergies.  Home Medications   Prior to Admission medications   Medication Sig Start Date End Date Taking? Authorizing Provider  acetaminophen (TYLENOL) 325 MG tablet Take 650 mg by mouth every 6 (six) hours as needed for mild pain.   Yes Historical Provider, MD  amLODipine (NORVASC) 5 MG tablet Take 2 tablets (10 mg total) by mouth daily. 09/10/13  Yes April K Palumbo-Rasch, MD   BP 156/85  Pulse 45  Temp(Src) 99.1 F (37.3 C)  Resp 18  SpO2 98% Physical Exam  Nursing note and vitals reviewed. Constitutional: He appears well-developed and well-nourished.  HENT:  Head: Normocephalic and atraumatic.  Eyes: Conjunctivae are normal. Pupils are equal, round, and reactive to light.  Neck: Neck supple. No tracheal deviation present. No thyromegaly present.  Cardiovascular: Normal rate and regular rhythm.   No murmur heard. Pulmonary/Chest:  Effort normal and breath sounds normal.  Abdominal: Soft. Bowel sounds are normal. He exhibits no distension. There is no tenderness.  Musculoskeletal: Normal range of motion. He exhibits no edema and no tenderness.  Neurological: He is alert. No cranial nerve deficit. Coordination normal.  Gait normal. Oriented to name and hospital does not know month or year  Skin: Skin is warm and dry. No rash noted.  Right foot plantar surface with dime sized mildly tender. No redness or warmth area.  Psychiatric: He has a normal mood and affect.    ED Course  Procedures (including critical care time) Labs Review Labs Reviewed - No data to display  Imaging Review No results found.   EKG Interpretation None     Patient's niece arrived and she states that she is his caretaker. She's been with him for approximately one year. She is no longer able to take care of him he wanders off frequently, almost on daily basis. Results for orders placed during the hospital encounter of 10/06/13  I-STAT CHEM 8, ED      Result Value Ref Range   Sodium 143  137 - 147 mEq/L   Potassium 4.4  3.7 - 5.3 mEq/L   Chloride 104  96 - 112 mEq/L   BUN 13  6 - 23 mg/dL   Creatinine, Ser 6.211.30  0.50 - 1.35 mg/dL   Glucose, Bld 308114 (*) 70 - 99 mg/dL   Calcium, Ion 6.571.25  8.461.13 - 1.30 mmol/L   TCO2 26  0 - 100  mmol/L   Hemoglobin 17.0  13.0 - 17.0 g/dL   HCT 29.5  62.1 - 30.8 %   Dg Chest 2 View  09/09/2013   CLINICAL DATA:  Chest pain.  EXAM: CHEST  2 VIEW  COMPARISON:  04/20/2013  FINDINGS: Normal heart size and pulmonary vascularity. Diffuse emphysematous changes and fibrosis in the lungs. 12 mm vague nodular opacity in the left upper lung was not present previously. Consider CT to exclude pulmonary nodule. No focal airspace disease or consolidation in the lungs. No blunting of costophrenic angles. No pneumothorax.  IMPRESSION: Emphysema and fibrosis in the lungs. Suggestion of developing nodule in the left apex. No evidence  of active infiltration.   Electronically Signed   By: Burman Nieves M.D.   On: 09/09/2013 23:43    MDM  Case manager consulted. Will arrange for placement to assisted living facility from the ED Final diagnoses:  None   Diagnosis #1 Alzheimer's dementia #2 hypertension #3 renal insufficiency     Doug Sou, MD 10/06/13 1911

## 2013-10-07 ENCOUNTER — Emergency Department (HOSPITAL_COMMUNITY): Payer: Medicare Other

## 2013-10-07 DIAGNOSIS — F0280 Dementia in other diseases classified elsewhere without behavioral disturbance: Secondary | ICD-10-CM | POA: Diagnosis not present

## 2013-10-07 DIAGNOSIS — G309 Alzheimer's disease, unspecified: Secondary | ICD-10-CM | POA: Diagnosis not present

## 2013-10-07 NOTE — ED Notes (Signed)
Per case manager wanda the patient will be here overnight due to paperwork issues. The plan is the family will finish the paperwork in the morning and the patient will discharge sometime after 9 am

## 2013-10-07 NOTE — ED Notes (Signed)
CALLED PHARMACY TO CHECK ON DELAY WITH GETTING PT BP MED. THIS IS SECOND COMMUNICATION ABOUT MISSING MEDICATION.

## 2013-10-07 NOTE — ED Notes (Signed)
Pt up and wandering around room. Pt able to be redirected, pants removed and placed in belongings bag in room for more comfort in bed. Pt also given some water to drink.

## 2013-10-07 NOTE — ED Notes (Signed)
CALLED SOCIAL WORKER AND LEFT MESSAGE ABOUT PROGRESS IN FINDING PT PLACEMENT

## 2013-10-07 NOTE — ED Notes (Addendum)
REP FROM [facility] HERE TO SEE PT(Joshua Gonzales)

## 2013-10-07 NOTE — ED Notes (Signed)
PER SOCIAL WORK [facility] AND WHEN FAMILY GETS PAPERWORK FILLED OUT PT CAN BE SENT TO NURSING HOME

## 2013-10-07 NOTE — Clinical Social Work Note (Signed)
Clinical Social Worker received notification from evening CSW that patient will require placement in secured facility - [facility] information and arranged on site visit.  [facility] with [facility] briefly at bedside and received all necessary paperwork.  Awaiting facility response regarding bed offer.  CSW requested chest xray from MD to expedite TB screen - chest xray ordered.  CSW updated patient niece Toniann Fail(Wendy) who is agreeable with current discharge plans.  CSW remains available for support and to facilitate patient discharge needs once bed available.  Joshua Gonzales, [place] [phone]

## 2013-10-07 NOTE — Clinical Social Work Note (Addendum)
Clinical Social Worker continuing to follow patient and family for support and discharge planning needs.  [facility] [phone]) and has been accepted for tomorrow 10/08/2013.  MD/RN aware of patient delay due to application of Special Assistance Medicaid.  CSW updated patient niece, Toniann FailWendy [phone]) who [facility] to complete paperwork this evening for patient admission tomorrow.  Patient family has requested that patient travel by ambulance.  Facility already has signed FL2 and necessary paperwork.  Patient will need discharge paperwork only upon discharge.  CSW remains available for support and to facilitate patient discharge needs.  Macario GoldsJesse Kamaiya [place]illa, [place] [phone]

## 2013-10-07 NOTE — ED Notes (Signed)
Breakfast delivered  

## 2013-10-08 DIAGNOSIS — F0280 Dementia in other diseases classified elsewhere without behavioral disturbance: Secondary | ICD-10-CM | POA: Diagnosis not present

## 2013-10-08 DIAGNOSIS — G309 Alzheimer's disease, unspecified: Secondary | ICD-10-CM | POA: Diagnosis not present

## 2013-10-08 NOTE — ED Notes (Signed)
CALLED [facility] TO CHECK ON PROGRESS OF PAPERWORK AT THE FACILITY SO PT CAN BE TRANSFERRED. LEFT MESSAGE ON VOICEMAIL FOR TAMMY

## 2013-10-08 NOTE — ED Notes (Signed)
Called pharmacy and requested pt norvasc

## 2013-10-08 NOTE — ED Notes (Signed)
[facility] HAS ARRIVED TO TRANSPORT PT TO NURSING HOME 

## 2013-10-08 NOTE — ED Notes (Signed)
Woke patient up, offered breakfast

## 2013-10-08 NOTE — Discharge Instructions (Signed)
E. discharge back to the nursing facility. Patient with history of Alzheimer's dementia.

## 2013-10-08 NOTE — ED Notes (Signed)
CALLED [facility] AND SPOKE TO TAMMY. SHE ADVISES THAT THE FAMILY HAS NOT COME BY AND FILLED OUT ANY PAPERWORK SO THEY ARE WAITING FOR THEM TO COME. SHE ALSO ADVISES THAT THE FAMILY IS ALSO TO FILL OUT MEDICAID SPECIAL FUNDING FORM ALSO. THEY ADVISE THAT THEY ARE HOPING TO GET PT ADMITTED AROUND LUNCH TIME

## 2013-10-08 NOTE — ED Notes (Signed)
[facility] CALLED TO TRANSPORT PT TO NURSING HOME

## 2014-05-15 ENCOUNTER — Emergency Department (HOSPITAL_COMMUNITY): Payer: Medicare Other

## 2014-05-15 ENCOUNTER — Observation Stay (HOSPITAL_COMMUNITY)
Admission: EM | Admit: 2014-05-15 | Discharge: 2014-05-16 | Disposition: A | Discharge status: Skilled Nursing Facility | Payer: Medicare Other | Attending: Infectious Disease | Admitting: Infectious Disease

## 2014-05-15 ENCOUNTER — Encounter (HOSPITAL_COMMUNITY): Payer: Self-pay | Admitting: Emergency Medicine

## 2014-05-15 DIAGNOSIS — R51 Headache: Secondary | ICD-10-CM

## 2014-05-15 DIAGNOSIS — I639 Cerebral infarction, unspecified: Principal | ICD-10-CM | POA: Insufficient documentation

## 2014-05-15 DIAGNOSIS — Z8673 Personal history of transient ischemic attack (TIA), and cerebral infarction without residual deficits: Secondary | ICD-10-CM | POA: Diagnosis not present

## 2014-05-15 DIAGNOSIS — F0391 Unspecified dementia with behavioral disturbance: Secondary | ICD-10-CM | POA: Insufficient documentation

## 2014-05-15 DIAGNOSIS — F0281 Dementia in other diseases classified elsewhere with behavioral disturbance: Secondary | ICD-10-CM | POA: Insufficient documentation

## 2014-05-15 DIAGNOSIS — I10 Essential (primary) hypertension: Secondary | ICD-10-CM | POA: Diagnosis not present

## 2014-05-15 DIAGNOSIS — E785 Hyperlipidemia, unspecified: Secondary | ICD-10-CM | POA: Diagnosis not present

## 2014-05-15 DIAGNOSIS — I480 Paroxysmal atrial fibrillation: Secondary | ICD-10-CM | POA: Insufficient documentation

## 2014-05-15 DIAGNOSIS — I63529 Cerebral infarction due to unspecified occlusion or stenosis of unspecified anterior cerebral artery: Secondary | ICD-10-CM | POA: Insufficient documentation

## 2014-05-15 DIAGNOSIS — G40909 Epilepsy, unspecified, not intractable, without status epilepticus: Secondary | ICD-10-CM | POA: Diagnosis not present

## 2014-05-15 DIAGNOSIS — R569 Unspecified convulsions: Secondary | ICD-10-CM | POA: Diagnosis not present

## 2014-05-15 DIAGNOSIS — F17210 Nicotine dependence, cigarettes, uncomplicated: Secondary | ICD-10-CM | POA: Insufficient documentation

## 2014-05-15 DIAGNOSIS — F1021 Alcohol dependence, in remission: Secondary | ICD-10-CM | POA: Insufficient documentation

## 2014-05-15 DIAGNOSIS — G309 Alzheimer's disease, unspecified: Secondary | ICD-10-CM | POA: Insufficient documentation

## 2014-05-15 DIAGNOSIS — H409 Unspecified glaucoma: Secondary | ICD-10-CM | POA: Diagnosis not present

## 2014-05-15 DIAGNOSIS — Z87891 Personal history of nicotine dependence: Secondary | ICD-10-CM | POA: Insufficient documentation

## 2014-05-15 DIAGNOSIS — R519 Headache, unspecified: Secondary | ICD-10-CM

## 2014-05-15 DIAGNOSIS — Z7982 Long term (current) use of aspirin: Secondary | ICD-10-CM | POA: Insufficient documentation

## 2014-05-15 DIAGNOSIS — I959 Hypotension, unspecified: Secondary | ICD-10-CM | POA: Diagnosis not present

## 2014-05-15 DIAGNOSIS — F03918 Unspecified dementia, unspecified severity, with other behavioral disturbance: Secondary | ICD-10-CM | POA: Insufficient documentation

## 2014-05-15 DIAGNOSIS — R001 Bradycardia, unspecified: Secondary | ICD-10-CM | POA: Diagnosis not present

## 2014-05-15 DIAGNOSIS — Z87898 Personal history of other specified conditions: Secondary | ICD-10-CM

## 2014-05-15 LAB — URINALYSIS, ROUTINE W REFLEX MICROSCOPIC
BILIRUBIN URINE: NEGATIVE
Glucose, UA: NEGATIVE mg/dL
Hgb urine dipstick: NEGATIVE
KETONES UR: NEGATIVE mg/dL
LEUKOCYTES UA: NEGATIVE
NITRITE: NEGATIVE
Protein, ur: NEGATIVE mg/dL
SPECIFIC GRAVITY, URINE: 1.019 (ref 1.005–1.030)
UROBILINOGEN UA: 1 mg/dL (ref 0.0–1.0)
pH: 5.5 (ref 5.0–8.0)

## 2014-05-15 LAB — COMPREHENSIVE METABOLIC PANEL
ALBUMIN: 3.4 g/dL — AB (ref 3.5–5.2)
ALT: 9 U/L (ref 0–53)
ANION GAP: 6 (ref 5–15)
AST: 19 U/L (ref 0–37)
Alkaline Phosphatase: 74 U/L (ref 39–117)
BUN: 10 mg/dL (ref 6–23)
CO2: 29 mmol/L (ref 19–32)
Calcium: 9.1 mg/dL (ref 8.4–10.5)
Chloride: 104 mmol/L (ref 96–112)
Creatinine, Ser: 1.18 mg/dL (ref 0.50–1.35)
GFR calc Af Amer: 70 mL/min — ABNORMAL LOW (ref >90)
GFR calc non Af Amer: 60 mL/min — ABNORMAL LOW (ref >90)
Glucose, Bld: 126 mg/dL — ABNORMAL HIGH (ref 70–99)
POTASSIUM: 3.9 mmol/L (ref 3.5–5.1)
SODIUM: 139 mmol/L (ref 135–145)
TOTAL PROTEIN: 6.9 g/dL (ref 6.0–8.3)
Total Bilirubin: 0.2 mg/dL — ABNORMAL LOW (ref 0.3–1.2)

## 2014-05-15 LAB — CBC WITH DIFFERENTIAL/PLATELET
Basophils Absolute: 0 10*3/uL (ref 0.0–0.1)
Basophils Relative: 0 % (ref 0–1)
EOS ABS: 0 10*3/uL (ref 0.0–0.7)
EOS PCT: 0 % (ref 0–5)
HEMATOCRIT: 41.2 % (ref 39.0–52.0)
Hemoglobin: 13.9 g/dL (ref 13.0–17.0)
Lymphocytes Relative: 15 % (ref 12–46)
Lymphs Abs: 1.2 10*3/uL (ref 0.7–4.0)
MCH: 31.1 pg (ref 26.0–34.0)
MCHC: 33.7 g/dL (ref 30.0–36.0)
MCV: 92.2 fL (ref 78.0–100.0)
MONO ABS: 0.4 10*3/uL (ref 0.1–1.0)
MONOS PCT: 6 % (ref 3–12)
NEUTROS PCT: 79 % — AB (ref 43–77)
Neutro Abs: 6.3 10*3/uL (ref 1.7–7.7)
Platelets: 200 10*3/uL (ref 150–400)
RBC: 4.47 MIL/uL (ref 4.22–5.81)
RDW: 13.5 % (ref 11.5–15.5)
WBC: 8 10*3/uL (ref 4.0–10.5)

## 2014-05-15 LAB — TSH: TSH: 0.735 u[IU]/mL (ref 0.350–4.500)

## 2014-05-15 LAB — ETHANOL: Alcohol, Ethyl (B): <5 mg/dL (ref 0–9)

## 2014-05-15 LAB — PHOSPHORUS: PHOSPHORUS: 3.2 mg/dL (ref 2.3–4.6)

## 2014-05-15 LAB — MAGNESIUM: Magnesium: 2 mg/dL (ref 1.5–2.5)

## 2014-05-15 LAB — TROPONIN I: Troponin I: <0.03 ng/mL (ref <0.031)

## 2014-05-15 LAB — I-STAT CG4 LACTIC ACID, ED: Lactic Acid, Venous: 1.71 mmol/L (ref 0.5–2.0)

## 2014-05-15 MED ORDER — LEVETIRACETAM 500 MG PO TABS: 500.0000 mg | ORAL_TABLET | Freq: Two times a day (BID) | ORAL | Status: DC

## 2014-05-15 MED ORDER — ADULT MULTIVITAMIN W/MINERALS CH
1.0000 | ORAL_TABLET | Freq: Every day | ORAL | Status: DC
  Administered 2014-05-15 – 2014-05-16 (×2): 1 via ORAL
  Filled 2014-05-15 (×2): qty 1

## 2014-05-15 MED ORDER — THIAMINE HCL 100 MG/ML IJ SOLN
100.0000 mg | Freq: Every day | INTRAMUSCULAR | Status: DC
  Filled 2014-05-15: qty 2

## 2014-05-15 MED ORDER — LATANOPROST 0.005 % OP SOLN
1.0000 [drp] | Freq: Every day | OPHTHALMIC | Status: DC
  Administered 2014-05-15: 1 [drp] via OPHTHALMIC
  Filled 2014-05-15: qty 2.5

## 2014-05-15 MED ORDER — LEVETIRACETAM IN NACL 1000 MG/100ML IV SOLN
1000.0000 mg | INTRAVENOUS | Status: AC
  Administered 2014-05-15: 1000 mg via INTRAVENOUS
  Filled 2014-05-15: qty 100

## 2014-05-15 MED ORDER — LORAZEPAM 1 MG PO TABS: 1.0000 mg | ORAL_TABLET | Freq: Four times a day (QID) | ORAL | Status: DC | PRN

## 2014-05-15 MED ORDER — VITAMIN B-1 100 MG PO TABS
100.0000 mg | ORAL_TABLET | Freq: Every day | ORAL | Status: DC
  Administered 2014-05-15 – 2014-05-16 (×2): 100 mg via ORAL
  Filled 2014-05-15 (×2): qty 1

## 2014-05-15 MED ORDER — SODIUM CHLORIDE 0.9 % IV SOLN
Freq: Once | INTRAVENOUS | Status: AC
  Administered 2014-05-15: 1000 mL via INTRAVENOUS

## 2014-05-15 MED ORDER — ENOXAPARIN SODIUM 40 MG/0.4ML ~~LOC~~ SOLN: 40.0000 mg | SUBCUTANEOUS | Status: DC

## 2014-05-15 MED ORDER — LEVETIRACETAM 500 MG PO TABS
500.0000 mg | ORAL_TABLET | Freq: Two times a day (BID) | ORAL | Status: DC
  Administered 2014-05-15 – 2014-05-16 (×2): 500 mg via ORAL
  Filled 2014-05-15 (×3): qty 1

## 2014-05-15 MED ORDER — PAROXETINE HCL 20 MG PO TABS
10.0000 mg | ORAL_TABLET | Freq: Every day | ORAL | Status: DC
  Administered 2014-05-16: 10 mg via ORAL
  Filled 2014-05-15: qty 0.5

## 2014-05-15 MED ORDER — FOLIC ACID 1 MG PO TABS
1.0000 mg | ORAL_TABLET | Freq: Every day | ORAL | Status: DC
  Administered 2014-05-15 – 2014-05-16 (×2): 1 mg via ORAL
  Filled 2014-05-15 (×2): qty 1

## 2014-05-15 MED ORDER — LORAZEPAM 2 MG/ML IJ SOLN: 1.0000 mg | Freq: Four times a day (QID) | INTRAMUSCULAR | Status: DC | PRN

## 2014-05-15 MED ORDER — DONEPEZIL HCL 10 MG PO TABS
10.0000 mg | ORAL_TABLET | Freq: Every day | ORAL | Status: DC
  Administered 2014-05-15: 10 mg via ORAL
  Filled 2014-05-15: qty 1

## 2014-05-15 MED ORDER — ASPIRIN 325 MG PO TABS
325.0000 mg | ORAL_TABLET | Freq: Every day | ORAL | Status: DC
  Administered 2014-05-16: 325 mg via ORAL
  Filled 2014-05-15: qty 1

## 2014-05-15 NOTE — ED Notes (Signed)
Spoke with Lab about CBC, advised they are reading the slide and give a few mins.

## 2014-05-15 NOTE — Consult Note (Signed)
NEURO HOSPITALIST CONSULT NOTE    Reason for Consult: Seizure  HPI:                                                                                                                                          Joshua PartridgeJohn H Gonzales is an 74 y.o. male brought to the ER from nursing home by EMS for period of unresponsiveness. Patient had a brief episode of unresponsiveness with rigidity and shaking of his extremities, gazing off to the left side. EMS responded and by that time this episode had resolved and he appeared to be post ictal. Patient was incontinent of stool during the episode. Patient states he as had "seizures in the past" but has not been placed on any medication. He states his last drink was this past weekend but does not give more detailed history. Currently he is back to his baseline.   Past Medical History  Diagnosis Date  . Hypertension   . Seizures   . Alzheimer disease     History reviewed. No pertinent past surgical history.  Family History  Problem Relation Age of Onset  . Hyperlipidemia Mother   . Hypertension Mother   . Hypertension Father   . Hyperlipidemia Father     Social History:  reports that he has quit smoking. He does not have any smokeless tobacco history on file. He reports that he drinks alcohol. His drug history is not on file.  No Known Allergies  MEDICATIONS:                                                                                                                     No current facility-administered medications for this encounter.   Current Outpatient Prescriptions  Medication Sig Dispense Refill  . acetaminophen (TYLENOL) 325 MG tablet Take 650 mg by mouth every 6 (six) hours as needed for mild pain.    Marland Kitchen. amLODipine (NORVASC) 5 MG tablet Take 2 tablets (10 mg total) by mouth daily. 30 tablet 0  . donepezil (ARICEPT) 10 MG tablet Take 10 mg by mouth at bedtime.    Marland Kitchen. latanoprost (XALATAN) 0.005 % ophthalmic solution Place 1  drop into both eyes at bedtime.    Marland Kitchen. lisinopril (PRINIVIL,ZESTRIL) 5 MG tablet Take 5 mg by mouth daily.    .Marland Kitchen  PARoxetine (PAXIL) 10 MG tablet Take 10 mg by mouth daily.    . Vitamin D, Ergocalciferol, (DRISDOL) 50000 UNITS CAPS capsule Take 50,000 Units by mouth every 7 (seven) days.        ROS:                                                                                                                                       History obtained from unobtainable from patient due to mental status   Blood pressure 121/65, pulse 72, temperature 98.1 F (36.7 C), temperature source Rectal, resp. rate 18, SpO2 100 %.   Neurologic Examination:                                                                                                      HEENT-  Normocephalic, no lesions, without obvious abnormality.  Normal external eye and conjunctiva.  Normal TM's bilaterally.  Normal auditory canals and external ears. Normal external nose, mucus membranes and septum.  Normal pharynx. Cardiovascular- irregularly irregular rhythm, pulses palpable throughout   Lungs- chest clear, no wheezing, rales, normal symmetric air entry, Heart exam - S1, S2 normal, no murmur, no gallop, rate regular Abdomen- normal findings: bowel sounds normal Extremities- no edema Lymph-no adenopathy palpable Musculoskeletal-no joint tenderness, deformity or swelling Skin-warm and dry, no hyperpigmentation, vitiligo, or suspicious lesions  Neurological Examination Mental Status: Alert, not oriented to place or year, able to state he has had seizure but not able to give detailed history.  Speech fluent without evidence of aphasia.  Able to follow 3 step commands without difficulty. Cranial Nerves: II: Discs flat bilaterally; Visual fields grossly normal, pupils equal, round, reactive to light and accommodation III,IV, VI: ptosis not present, extra-ocular motions intact bilaterally V,VII: smile symmetric, facial light touch  sensation normal bilaterally VIII: hearing normal bilaterally IX,X: uvula rises symmetrically XI: bilateral shoulder shrug XII: midline tongue extension Motor: Right : Upper extremity   5/5    Left:     Upper extremity   5/5  Lower extremity   5/5     Lower extremity   5/5 Tone and bulk:normal tone throughout; no atrophy noted Sensory: Pinprick and light touch intact throughout, bilaterally Deep Tendon Reflexes: 2+ and symmetric throughout Plantars: Right: downgoing   Left: downgoing Cerebellar: normal finger-to-nose,and normal heel-to-shin test Gait: not tested due to safety      Lab Results: Basic Metabolic Panel: No results for input(s): NA, K, CL, CO2, GLUCOSE, BUN, CREATININE, CALCIUM, MG, PHOS  in the last 168 hours.  Liver Function Tests: No results for input(s): AST, ALT, ALKPHOS, BILITOT, PROT, ALBUMIN in the last 168 hours. No results for input(s): LIPASE, AMYLASE in the last 168 hours. No results for input(s): AMMONIA in the last 168 hours.  CBC: No results for input(s): WBC, NEUTROABS, HGB, HCT, MCV, PLT in the last 168 hours.  Cardiac Enzymes: No results for input(s): CKTOTAL, CKMB, CKMBINDEX, TROPONINI in the last 168 hours.  Lipid Panel: No results for input(s): CHOL, TRIG, HDL, CHOLHDL, VLDL, LDLCALC in the last 168 hours.  CBG: No results for input(s): GLUCAP in the last 168 hours.  Microbiology: Results for orders placed or performed during the hospital encounter of 11/23/12  CSF culture     Status: None   Collection Time: 11/27/12  9:03 PM  Result Value Ref Range Status   Specimen Description CSF  Final   Special Requests NONE  Final   Gram Stain   Final    RARE WBC PRESENT, PREDOMINANTLY PMN NO ORGANISMS SEEN Performed at [facility] Performed at [facility]   Culture   Final    NO GROWTH 3 DAYS Performed at [facility]   Report Status 12/01/2012 FINAL  Final  Gram stain - STAT with CSF culture     Status: None    Collection Time: 11/27/12  9:04 PM  Result Value Ref Range Status   Specimen Description CSF  Final   Special Requests NONE  Final   Gram Stain   Final    WBC PRESENT, PREDOMINANTLY MONONUCLEAR NO ORGANISMS SEEN CYTOSPIN SLIDE   Report Status 11/27/2012 FINAL  Final    Coagulation Studies: No results for input(s): LABPROT, INR in the last 72 hours.  Imaging: Ct Head Wo Contrast  05/15/2014   CLINICAL DATA:  Sudden arm stenting and gaze to the left side, possible stroke. Possible seizure. Fecal incontinence. History of dementia. Initially hypotensive and bradycardic.  EXAM: CT HEAD WITHOUT CONTRAST  TECHNIQUE: Contiguous axial images were obtained from the base of the skull through the vertex without intravenous contrast.  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 Scattered remote lacunar infarcts involving the thalami, right lentiform nucleus, anterior limb left internal capsule, and bilateral caudate heads. Remote infarct, right parietal lobe. Periventricular white matter and corona radiata hypodensities favor chronic ischemic microvascular white matter disease.  No intracranial hemorrhage, mass lesion, or acute CVA. There is atherosclerotic calcification of the cavernous carotid arteries bilaterally.  IMPRESSION: 1. Multiple remote infarcts, chronic microvascular white matter disease, and remote lacunar infarcts; no acute intracranial findings are identified.   Electronically Signed   By: Gaylyn Rong M.D.   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 Presenting to ED after seizure like episode. Given his old right parietal infarct, Dementia and description of event (left gaze and  bilateral arm shaking with incontinence), episode is worrisome for true seizure. At this time he would benefit from initiating AED.  As he is back to baseline and has history of similar events, do not feel the  need for MRI or EEG.   Recommend: 1) Keppra 1000 mg load followed by 500 mg BID Will continue to follow.  Patient seen and examined together with physician assistant and I concur with the assessment and plan.  Dorian Pod, MD

## 2014-05-15 NOTE — Progress Notes (Signed)
Patient admitted via ER. Patient alert and oriented to baseline. Patient made comfortable.

## 2014-05-15 NOTE — ED Notes (Addendum)
Pt was sitting in wheelchair; arms stiffened and and he gazed to left side. [facility] thought he may be having stroke. Was post ictal for EMS with fecal incontinence. Now A&Ox3; hx of dementia. Initially hypotensive and diaphoretic and brady to 40's.  Mentating during this. Gave 300 ccs fluid and pt's vitals improved. CBG 174.

## 2014-05-15 NOTE — ED Notes (Signed)
Pt had large stool in diaper; turned and cleaned; fresh diaper placed.

## 2014-05-15 NOTE — Progress Notes (Signed)
Tried to call ED back to get report on the patient, but was kept on hold until call was lost. Will call back

## 2014-05-15 NOTE — H&P (Signed)
Date: 05/15/2014               Patient Name:  Joshua Gonzales MRN: [id number]  DOB: [birth year] Age / Sex: 74 y.o., male   PCP: Pcp Not In System         Medical Service: Internal Medicine Teaching Service         Attending Physician: Dr. Randall Hiss, MD    First Contact: Rebbeca Paul, MS3 Dr. Eleonore Chiquito Pager: [phone]  Second Contact: Dr. Inocente Salles Pager: [phone]       After Hours (After 5p/  First Contact Pager: [phone]  weekends / holidays): Second Contact Pager: [phone]   Chief Complaint: Observed unresponsiveness and shaking at his SNF today  History of Present Illness: Joshua Gonzales is a 74 yo man with a history of multiple CVAs, atrial fibrillation (not on anticoagulation) and dementia who was brought to the ED by EMS after a period of unresponsiveness at his SNF earlier today. During the episode, his extremities were noted to be shaking and he was gazing to the left. He lost continence of his stools. By the time EMS arrived, his unresponsiveness and shaking had subsided, but he was confused.   There is mention of a seizure history in the patient's prior ED visits, but this history is always per the patient. Today, he states that has had seizures previously, "every few days", but has never taken anti-epileptics. The patient's dementia makes this history questionable.   In the ED, he had an episode of diaphoresis and bradycardia; these signs improved with IVF, but he was admitted for observation overnight.  Meds: Current Facility-Administered Medications  Medication Dose Route Frequency Provider Last Rate Last Dose  . levETIRAcetam (KEPPRA) tablet 500 mg  500 mg Oral BID Ulice Dash, PA-C       Current Outpatient Prescriptions  Medication Sig Dispense Refill  . acetaminophen (TYLENOL) 325 MG tablet Take 650 mg by mouth every 6 (six) hours as needed for mild pain.    Marland Kitchen amLODipine (NORVASC) 5 MG tablet Take 2 tablets (10 mg total) by mouth daily.  30 tablet 0  . donepezil (ARICEPT) 10 MG tablet Take 10 mg by mouth at bedtime.    Marland Kitchen latanoprost (XALATAN) 0.005 % ophthalmic solution Place 1 drop into both eyes at bedtime.    Marland Kitchen lisinopril (PRINIVIL,ZESTRIL) 5 MG tablet Take 5 mg by mouth daily.    Marland Kitchen PARoxetine (PAXIL) 10 MG tablet Take 10 mg by mouth daily.    . Vitamin D, Ergocalciferol, (DRISDOL) 50000 UNITS CAPS capsule Take 50,000 Units by mouth every 7 (seven) days.      Allergies: Allergies as of 05/15/2014  . (No Known Allergies)   Past Medical History  Diagnosis Date  . Hypertension   . Seizures   . Alzheimer disease    History reviewed. No pertinent past surgical history. Family History  Problem Relation Age of Onset  . Hyperlipidemia Mother   . Hypertension Mother   . Hypertension Father   . Hyperlipidemia Father    History   Social History  . Marital Status: Single    Spouse Name: N/A  . Number of Children: N/A  . Years of Education: N/A   Occupational History  . Not on file.   Social History Main Topics  . Smoking status: Former Games developer  . Smokeless tobacco: Not on file  . Alcohol Use: Yes  . Drug Use: Not on file  . Sexual Activity: Not on  file   Other Topics Concern  . Not on file   Social History Narrative    Review of Systems: General: recent cold symptoms Skin: no rashes or lesions HEENT: + nasal congestion and cough, no sputum, no fever or chills, no headaches Cardiac: no chest pain, no palpitations Respiratory: no shortness of breath GI: + loss of bowel continence earlier today while unconscious, no other changes in BM, no N/V Urinary: no changes in urination Msk: no joint pain   Physical Exam: Blood pressure 115/66, pulse 70, temperature 98.1 F (36.7 C), temperature source Rectal, resp. rate 18, SpO2 91 %. Appearance: in NAD, resting in bed with diaper in place HEENT: AT/[place], PERRL, EOMi, no JVD, no carotid bruits Heart: RRR, normal S1S2, no murmurs Lungs: CTAB, no  wheezes Abdomen: BS+, soft, nontender Musculoskeletal: scar on thoracic spine (skin excision?), small lipoma  Extremities: no edema Neurologic:  I: smell Not tested  II: visual acuity  Appears intact OU  II: visual fields Full to finger counting OU  II: pupils Equal, round, reactive to light  III,VII: ptosis None  III,IV,VI: extraocular muscles  Full ROM  V: mastication Not tested  V: facial light touch sensation  Normal  V,VII: corneal reflex  Not tested  VII: facial muscle function - upper  Normal  VII: facial muscle function - lower Normal  VIII: hearing Full and symmetric  IX: soft palate elevation  Normal  IX,X: gag reflex Not tested  XI: trapezius strength  5/5  XI: sternocleidomastoid strength 5/5  XI: neck flexion strength  5/5  XII: tongue strength  Normal  Strength 5/5 throughout, sensation full throughout, babinskis downgoing b/l, FNF intact, gait normal, slurred speech  Lab results: Basic Metabolic Panel:  Recent Labs  16/12/9601/11/16 1303  NA 139  K 3.9  CL 104  CO2 29  GLUCOSE 126*  BUN 10  CREATININE 1.18  CALCIUM 9.1   Liver Function Tests:  Recent Labs  05/15/14 1303  AST 19  ALT 9  ALKPHOS 74  BILITOT 0.2*  PROT 6.9  ALBUMIN 3.4*   CBC:  Recent Labs  05/15/14 1303  WBC 8.0  NEUTROABS 6.3  HGB 13.9  HCT 41.2  MCV 92.2  PLT 200   Cardiac Enzymes:  Recent Labs  05/15/14 1303  TROPONINI <0.03   Urine Drug Screen: Drugs of Abuse     Component Value Date/Time   LABOPIA NONE DETECTED 04/20/2013 0015   COCAINSCRNUR NONE DETECTED 04/20/2013 0015   LABBENZ NONE DETECTED 04/20/2013 0015   AMPHETMU NONE DETECTED 04/20/2013 0015   THCU NONE DETECTED 04/20/2013 0015   LABBARB NONE DETECTED 04/20/2013 0015    Alcohol Level:  Recent Labs  05/15/14 1303  ETH <5   Urinalysis:  Recent Labs  05/15/14 1156  COLORURINE YELLOW  LABSPEC 1.019  PHURINE 5.5  GLUCOSEU NEGATIVE  HGBUR NEGATIVE  BILIRUBINUR  NEGATIVE  KETONESUR NEGATIVE  PROTEINUR NEGATIVE  UROBILINOGEN 1.0  NITRITE NEGATIVE  LEUKOCYTESUR NEGATIVE    Imaging results:  Ct Head Wo Contrast  05/15/2014   CLINICAL DATA:  Sudden arm stenting and gaze to the left side, possible stroke. Possible seizure. Fecal incontinence. History of dementia. Initially hypotensive and bradycardic.  EXAM: CT HEAD WITHOUT CONTRAST  TECHNIQUE: Contiguous axial images were obtained from the base of the skull through the vertex without intravenous contrast.  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 Scattered remote lacunar infarcts involving the thalami, right lentiform nucleus, anterior limb  left internal capsule, and bilateral caudate heads. Remote infarct, right parietal lobe. Periventricular white matter and corona radiata hypodensities favor chronic ischemic microvascular white matter disease.  No intracranial hemorrhage, mass lesion, or acute CVA. There is atherosclerotic calcification of the cavernous carotid arteries bilaterally.  IMPRESSION: 1. Multiple remote infarcts, chronic microvascular white matter disease, and remote lacunar infarcts; no acute intracranial findings are identified.   Electronically Signed   By: Gaylyn Rong M.D.   On: 05/15/2014 13:09    Other results: EKG: rate 50, sinus bradycardia, probable anterolateral infarct consistent with EKG (04/20/13)   Assessment & Plan by Problem: Active Problems:   Seizure  [name] is a 74 yo man with a history of multiple CVAs, atrial fibrillation without anticoagulation, dementia and a self-reported history of seizures who presented after an observed episode of unresponsiveness with limb shaking and loss of continence.  Observed Seizure Activity: Given his prior CVAs, particular that in the right parietal region, his left-gaze shaking is concerning for true seizure. His loss of continence is also suggestive of this. He  does have a history of alcohol abuse, but last used 6 days ago; ethanol <5 on admission, no signs of withdrawal. LA WNL, electrolytes WNL. Cough present, but no evidence of infection on exam, WBC 8.0. Afebrile. The only medication that he is on that has been seen to lower the seizure threshold is his SSRI. - Initiate anti-epileptic drug; load 1000 mg then follow with 500 mg BID - Elucidate better history of seizure history from SNF or family tomorrow - IVF NS 125 mL/hr - Phosphorus, TSH, magnesium levels pending - Telemetry - Seizure precautions and neuro checks - Appreciate neurology consult  History of Multiple CVAs: Being treated for hypertension, former smoker. - On aspirin 325 mg daily for secondary stroke prevention; continue here - HgbA1c pending  History of Atrial Fibrillation: Not on anticoagulation. Has a Chadsvasc of 4 (a/c recommended), but may be a poor candidate given fall history.  - Determine fall history from SNF  Bradycardia and Diaphoresis: Resolved. Currently pulse of 73. - Continue to follow VS  Hypertension: Patient currently 114/77. On norvasc 5 mg daily and lisinopril 5 mg daily at home. - Hold antihypertensives tonight; can resume tomorrow  Alzheimer's Disease: On Aricept at home. - Continue home Aricept 10 mg at bedtime - Continue home Paxil 10 mg daily, but keep in mind that SSRI have on rare occasion been known to lower seizure threshold  Glaucoma: On xalatan OU, presumably for glaucoma. - Continue xalatan in hospital  History of Alcohol Abuse: Alcohol negative today.   Diet: HH  DVT Ppx: lovenox  Dispo: Disposition is deferred at this time, awaiting improvement of current medical problems. Anticipated discharge in approximately 1 day(s).   The patient does not have a current PCP (Pcp Not In System) and does not know need an [facility] hospital follow-up appointment after discharge.  The patient does not know have transportation limitations that hinder  transportation to clinic appointments.  Signed: Stark Bray, MD 05/15/2014, 4:25 PM

## 2014-05-15 NOTE — Progress Notes (Signed)
MD notified that patient is admitted to the unit.

## 2014-05-15 NOTE — H&P (Signed)
Joshua PartridgeJohn H Gonzales is an 74 y.o. male.   Chief Complaint: unresponsiveness and rigidity with hypotension/bradycardia   History of Present Illness:  74 y.o. 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 By the time EMS responded, the episode had resolved; however, EMS noted that the patient was diaphoretic with pallor, hypotension and bradycardia and complained of headaches. When speaking with the patient he exhibited slurred, tangential speech. He denied SOB/chest pain but repeatedly mentioned having a "slight cold" with a dry cough. He stated he has headaches "sometimes". Denies N/V/D/fever/chills and denies any memory of falling or hitting his head. Patient denies any recent illness or trouble sleeping. He states he hasn't eaten today. He smokes a couple of cigarettes a day and drinks 1-2 alcoholic drinks a couple of times a week (stated his last drink was 5-6 days ago). He denies any other drug use and denies taking any new medications. He has never taken seizure medications but states that he has had seizures in the past, but was unsure of when he had his last one.   ED Procedures: Patient was administered fluids and taken for CT w/o contrast (which showed multiple previous strokes in left occipital and right parietal). CBC with diff, CMP, UA, Troponin I, I-STAT CG4 Lactic Acid. Initiated Keppra 1000mg  followed by 500mg  BID.   Medications:  Current facility-administered medications:  .  levETIRAcetam (KEPPRA) tablet 500 mg, 500 mg, Oral, BID, Joshua Dashavid R Smith, PA-C  Prescriptions prior to admission  Medication Sig Dispense Refill Last Dose  . acetaminophen (TYLENOL) 325 MG tablet Take 650 mg by mouth every 6 (six)  hours as needed for mild pain.   Unk  . amLODipine (NORVASC) 5 MG tablet Take 2 tablets (10 mg total) by mouth daily. 30 tablet 0 05/15/2014 at Unknown time  . donepezil (ARICEPT) 10 MG tablet Take 10 mg by mouth at bedtime.   05/14/2014 at Unknown time  . latanoprost (XALATAN) 0.005 % ophthalmic solution Place 1 drop into both eyes at bedtime.   05/14/2014 at Unknown time  . lisinopril (PRINIVIL,ZESTRIL) 5 MG tablet Take 5 mg by mouth daily.   05/15/2014 at Unknown time  . PARoxetine (PAXIL) 10 MG tablet Take 10 mg by mouth daily.   05/15/2014 at Unknown time  . Vitamin D, Ergocalciferol, (DRISDOL) 50000 UNITS CAPS capsule Take 50,000 Units by mouth every 7 (seven) days.   05/15/2014 at Unknown time    Allergies: No Known Allergies   Past Medical History:  Past Medical History  Diagnosis Date  . Hypertension   . Seizures   . Alzheimer disease     Past Surgical History: History reviewed. No pertinent past surgical history.   Family History  Problem Relation Age of Onset  . Hyperlipidemia Mother   . Hypertension Mother   . Hypertension Father   . Hyperlipidemia Father    History   Social History  . Marital Status: Single    Spouse Name: N/A  . Number of Children: N/A  . Years of Education: N/A   Occupational History  . Not on file.   Social History Main Topics  . Smoking status: Former Games developermoker  . Smokeless tobacco: Not on file  . Alcohol Use: Yes  .  Drug Use: Not on file  . Sexual Activity: Not on file   Other Topics Concern  . Not on file   Social History Narrative    Review of Systems  HENT: Positive for congestion.   Respiratory: Positive for cough.   Gastrointestinal:       Incontinence of stool during episode at nursing home  Neurological: Positive for seizures and headaches.  All other systems reviewed and are negative.   Filed Vitals:   05/15/14 1500 05/15/14 1535 05/15/14 1545 05/15/14 1700  BP: 152/93 116/77 115/66 114/74  Pulse: 88 79 70 73  Temp:     98.4 F (36.9 C)  TempSrc:    Oral  Resp: 18 16 18 18   Height:    5\' 9"  (1.753 m)  Weight:    79.379 kg (175 lb)  SpO2: 94% 96% 91% 96%   Physical Exam  Constitutional: He appears well-developed and well-nourished.  HENT:  Head: Normocephalic.  Mouth/Throat: Oropharynx is clear and moist. No oral lesions. No lacerations.  No signs of tongue biting.   Eyes: Conjunctivae and EOM are normal. Pupils are equal, round, and reactive to light. No scleral icterus.  Neck: Normal range of motion. Neck supple. No JVD present. No tracheal deviation present. No thyromegaly present.  Cardiovascular: Normal rate, regular rhythm, normal heart sounds and intact distal pulses.  Exam reveals no gallop and no friction rub.   No murmur heard. Pulses:      Radial pulses are 2+ on the right side, and 2+ on the left side.       Dorsalis pedis pulses are 1+ on the right side, and 1+ on the left side.  Respiratory: Effort normal and breath sounds normal. No stridor. No respiratory distress. He has no wheezes. He has no rales. He exhibits no tenderness.  GI: Soft. Bowel sounds are normal. He exhibits no distension and no mass. There is tenderness. There is no rebound and no guarding.  Tenderness in the epigastric region  Musculoskeletal: Normal range of motion. He exhibits no edema or tenderness.  Lymphadenopathy:    He has no cervical adenopathy.  Neurological: He is alert. He has normal strength. He displays no atrophy and no tremor. No cranial nerve deficit or sensory deficit. He exhibits normal muscle tone. He displays no seizure activity. Coordination and gait normal.  Skin: Skin is warm and intact. No abrasion, no bruising, no ecchymosis, no laceration, no lesion, no petechiae and no rash noted. He is not diaphoretic. No cyanosis or erythema. No pallor. Nails show no clubbing.     Psychiatric: He has a normal mood and affect. His behavior is normal. Thought content normal. His speech is tangential and  slurred. Cognition and memory are impaired.    Lab Results:  BMP Latest Ref Rng 05/15/2014 10/06/2013 09/09/2013  Glucose 70 - 99 mg/dL 811(B) 147(W) 295(A)  BUN 6 - 23 mg/dL 10 13 14   Creatinine 0.50 - 1.35 mg/dL 2.13 0.86 5.78  Sodium 135 - 145 mmol/L 139 143 138  Potassium 3.5 - 5.1 mmol/L 3.9 4.4 4.0  Chloride 96 - 112 mmol/L 104 104 98  CO2 19 - 32 mmol/L 29 - 28  Calcium 8.4 - 10.5 mg/dL 9.1 - 9.1      Component Value Date/Time   WBC 8.0 05/15/2014 1303   RBC 4.47 05/15/2014 1303   HGB 13.9 05/15/2014 1303   HCT 41.2 05/15/2014 1303   PLT 200 05/15/2014 1303   MCV 92.2 05/15/2014 1303  MCH 31.1 05/15/2014 1303   MCHC 33.7 05/15/2014 1303   RDW 13.5 05/15/2014 1303   LYMPHSABS 1.2 05/15/2014 1303   MONOABS 0.4 05/15/2014 1303   EOSABS 0.0 05/15/2014 1303   BASOSABS 0.0 05/15/2014 1303    Recent Labs  05/15/14 1303  TROPONINI <0.03   Hepatic Function Panel     Component Value Date/Time   PROT 6.9 05/15/2014 1303   ALBUMIN 3.4* 05/15/2014 1303   AST 19 05/15/2014 1303   ALT 9 05/15/2014 1303   ALKPHOS 74 05/15/2014 1303   BILITOT 0.2* 05/15/2014 1303    Drugs of Abuse     Component Value Date/Time   LABOPIA NONE DETECTED 04/20/2013 0015   COCAINSCRNUR NONE DETECTED 04/20/2013 0015   LABBENZ NONE DETECTED 04/20/2013 0015   AMPHETMU NONE DETECTED 04/20/2013 0015   THCU NONE DETECTED 04/20/2013 0015   LABBARB NONE DETECTED 04/20/2013 0015    Urinalysis    Component Value Date/Time   COLORURINE YELLOW 05/15/2014 1156   APPEARANCEUR CLEAR 05/15/2014 1156   LABSPEC 1.019 05/15/2014 1156   PHURINE 5.5 05/15/2014 1156   GLUCOSEU NEGATIVE 05/15/2014 1156   HGBUR NEGATIVE 05/15/2014 1156   BILIRUBINUR NEGATIVE 05/15/2014 1156   KETONESUR NEGATIVE 05/15/2014 1156   PROTEINUR NEGATIVE 05/15/2014 1156   UROBILINOGEN 1.0 05/15/2014 1156   NITRITE NEGATIVE 05/15/2014 1156   LEUKOCYTESUR NEGATIVE 05/15/2014 1156     Imaging Results: Remote infarcts, left  inferior cerebellum and left PCA distribution. Scattered remote lacunar infarcts involving the thalami, right lentiform nucleus, anterior limb left internal capsule, and bilateral caudate heads. Remote infarct, right parietal lobe. Periventricular white matter and corona radiata hypodensities favor chronic ischemic microvascular white matter disease. No intracranial hemorrhage, mass lesion, or acute CVA. There is atherosclerotic calcification of the cavernous carotid arteries bilaterally. IMPRESSION: 1. Multiple remote infarcts, chronic microvascular white matter disease, and remote lacunar infarcts; no acute intracranial findings are identified. - Gaylyn Rong M.D.  Assessment/Plan 74 y.o. male with a PMH significant for CVAs, seizures, atrial fibrillation in the setting of no anticoagulation therapy, HTN, HLD, Alzheimer's disease and history of alcohol abuse who presented to the ED from nursing home after an observed period of unresponsiveness w/ rigidity and shaking of his extremities, gazing to the left side and incontinence of stool during the episode. Given his symptoms, along with a lack of focal neurological deficits (making stroke/TIA less likely) and his personal reported history of seizures, seizure is at the top of my differential. His old right parietal infarct, Alzheimer's Disease and the presence of a left gaze and bilateral arm shaking with incontinence during this episode also make seizure the most likely diagnosis. He has never taken anti-seizure medications in the past; however, I believe he would benefit from an AED given his recurrent history. Consider EEG.  Other possible diagnoses on my differential include stroke/TIA due to cerebrovascular disease, drug toxicity or alcohol withdrawal induced seizure (less likely given negative drug screen (ethanol <5 on admission) and patient's stated minimal alcohol use in the setting of no signs of withdrawal; mostly benign medications on his  current medication list for seizure risk - SSRI could lower threshold), cold/viral induced seizure from lowered threshold given the patient's symptoms of cough and runny nose (less likely given WBC 8.0, afebrile and no other outward signs of infection), brain tumor (less likely given negative signs on CT w/o contrast but could consider an MRI), and electrolyte/metabolic abnormalities (U/A negative and all other labs w/in normal limits), hepatic encephalopathy in the  setting of alcohol abuse (liver panel unremarkable and no new, noticeable cognitive deficits, no hepatomegaly). Neurofibromatosis and Tuberous Sclerosis also known to be associated with seizure activity (unlikely given absence of remarkable skin lesions/masses, additional symptoms and negative CT scan).   Seizure Activity: Continue Keppra  daily and contact his PCP/SNF for better history. Monitor for continued seizure activity and f/u w/ neurology.  HTN: Continue ASA 325 daily. Hold norvasc  daily and lisinopril  daily until tomorrow.  DVT prophylaxis: Lovenox  daily   Atrial Fibrillation: Patient's CHA2DS2-VASc score is 4 - moderate/high risk. Needs to be on anticoagulation therapy such as warfarin.   Alzheimer's Disease: Continue Aricept  at bedtime. Hold Paxil  daily due to increased risk for lowering seizure threshold.   Alcohol Abuse: Screen negative today. Counsel patient on importance of abstinence given his comorbidities.    Patient was placed on a heart healthy diet. HgbA1c, phosphorus, TSH, magnesium levels pending  Vara Guardian 05/15/2014, 4:57 PM

## 2014-05-15 NOTE — ED Notes (Signed)
Attempted report x1. 

## 2014-05-15 NOTE — ED Provider Notes (Signed)
CSN: [id number]     Arrival date & time 05/15/14  1038 History   First MD Initiated Contact with Patient 05/15/14 1041     Chief Complaint  Patient presents with  . Hypotension     (Consider location/radiation/quality/duration/timing/severity/associated sxs/prior Treatment) HPI Comments: Patient brought to the ER from nursing home by EMS for period of unresponsiveness. Patient had a brief episode of unresponsiveness with rigidity and shaking of his extremities, gazing off to the left side. EMS responded and by that time this episode had resolved and he appeared to be post ictal. He does have a history of seizures. Patient was incontinent of stool during the episode. He initially complained of headache for EMS, but at arrival to the ER, he denies headache. He denies recent illness, chest pain, shortness of breath, abdominal pain, vomiting, diarrhea.  EMS report that the patient was hypotensive for them. He did have an episode of diaphoresis with pallor. He became bradycardic. Patient was administered fluids and has improved.   Past Medical History  Diagnosis Date  . Hypertension   . Seizures   . Alzheimer disease    History reviewed. No pertinent past surgical history. Family History  Problem Relation Age of Onset  . Hyperlipidemia Mother   . Hypertension Mother   . Hypertension Father   . Hyperlipidemia Father    History  Substance Use Topics  . Smoking status: Former Games developer  . Smokeless tobacco: Not on file  . Alcohol Use: Yes    Review of Systems  Respiratory: Negative for shortness of breath.   Cardiovascular: Negative for chest pain.  Neurological: Positive for dizziness and seizures.  All other systems reviewed and are negative.     Allergies  Review of patient's allergies indicates no known allergies.  Home Medications   Prior to Admission medications   Medication Sig Start Date End Date Taking? Authorizing Provider  acetaminophen (TYLENOL) 325 MG tablet Take  650 mg by mouth every 6 (six) hours as needed for mild pain.    Historical Provider, MD  amLODipine (NORVASC) 5 MG tablet Take 2 tablets (10 mg total) by mouth daily. 09/10/13  Yes April Palumbo, MD  donepezil (ARICEPT) 10 MG tablet Take 10 mg by mouth at bedtime.   Yes Historical Provider, MD  latanoprost (XALATAN) 0.005 % ophthalmic solution Place 1 drop into both eyes at bedtime.   Yes Historical Provider, MD  lisinopril (PRINIVIL,ZESTRIL) 5 MG tablet Take 5 mg by mouth daily.   Yes Historical Provider, MD  PARoxetine (PAXIL) 10 MG tablet Take 10 mg by mouth daily.   Yes Historical Provider, MD  Vitamin D, Ergocalciferol, (DRISDOL) 50000 UNITS CAPS capsule Take 50,000 Units by mouth every 7 (seven) days.   Yes Historical Provider, MD   BP 122/70 mmHg  Pulse 52  Temp(Src) 98.1 F (36.7 C) (Rectal)  Resp 20  SpO2 93% Physical Exam  Constitutional: He is oriented to person, place, and time. He appears well-developed and well-nourished. No distress.  HENT:  Head: Normocephalic and atraumatic.  Right Ear: Hearing normal.  Left Ear: Hearing normal.  Nose: Nose normal.  Mouth/Throat: Oropharynx is clear and moist and mucous membranes are normal.  Eyes: Conjunctivae and EOM are normal. Pupils are equal, round, and reactive to light.  Neck: Normal range of motion. Neck supple.  Cardiovascular: Regular rhythm, S1 normal and S2 normal.  Exam reveals no gallop and no friction rub.   No murmur heard. Pulmonary/Chest: Effort normal and breath sounds normal. No respiratory distress.  He exhibits no tenderness.  Abdominal: Soft. Normal appearance and bowel sounds are normal. There is no hepatosplenomegaly. There is no tenderness. There is no rebound, no guarding, no tenderness at McBurney's point and negative Murphy's sign. No hernia.  Musculoskeletal: Normal range of motion.  Neurological: He is alert and oriented to person, place, and time. He has normal strength. No cranial nerve deficit or sensory  deficit. Coordination normal. GCS eye subscore is 4. GCS verbal subscore is 5. GCS motor subscore is 6.  Skin: Skin is warm, dry and intact. No rash noted. No cyanosis.  Psychiatric: He has a normal mood and affect. His speech is normal and behavior is normal. Thought content normal.  Nursing note and vitals reviewed.   ED Course  Procedures (including critical care time) Labs Review Labs Reviewed  CBC WITH DIFFERENTIAL/PLATELET - Abnormal; Notable for the following:    Neutrophils Relative % 79 (*)    All other components within normal limits  COMPREHENSIVE METABOLIC PANEL - Abnormal; Notable for the following:    Glucose, Bld 126 (*)    Albumin 3.4 (*)    Total Bilirubin 0.2 (*)    GFR calc non Af Amer 60 (*)    GFR calc Af Amer 70 (*)    All other components within normal limits  URINALYSIS, ROUTINE W REFLEX MICROSCOPIC  TROPONIN I  ETHANOL  I-STAT CG4 LACTIC ACID, ED    Imaging Review Ct Head Wo Contrast  05/15/2014   CLINICAL DATA:  Sudden arm stenting and gaze to the left side, possible stroke. Possible seizure. Fecal incontinence. History of dementia. Initially hypotensive and bradycardic.  EXAM: CT HEAD WITHOUT CONTRAST  TECHNIQUE: Contiguous axial images were obtained from the base of the skull through the vertex without intravenous contrast.  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 Scattered remote lacunar infarcts involving the thalami, right lentiform nucleus, anterior limb left internal capsule, and bilateral caudate heads. Remote infarct, right parietal lobe. Periventricular white matter and corona radiata hypodensities favor chronic ischemic microvascular white matter disease.  No intracranial hemorrhage, mass lesion, or acute CVA. There is atherosclerotic calcification of the cavernous carotid arteries bilaterally.  IMPRESSION: 1. Multiple remote infarcts, chronic microvascular white matter disease, and remote  lacunar infarcts; no acute intracranial findings are identified.   Electronically Signed   By: Gaylyn RongWalter  Liebkemann M.D.   On: 05/15/2014 13:09     EKG Interpretation   Date/Time:  Friday May 15 2014 11:06:19 EST Ventricular Rate:  50 PR Interval:  73 QRS Duration: 95 QT Interval:  402 QTC Calculation: 367 R Axis:   110 Text Interpretation:  Sinus brady Atrial premature complex Probable  anterolateral infarct, age indeterm Baseline wander in lead(s) V5  bradycardic c/w prior tracing, o/w no sig change Confirmed by Marion Rosenberry  MD,  Kleigh Hoelzer [phone] on 05/15/2014 11:18:08 AM      MDM   Final diagnoses:  Headache  Seizure    Patient presents to the ER for evaluation of seizure. Patient does report that he has had seizures in the past, but has not been on any antiseizure medication. Reviewing his records does make reference to previous seizures, but he also has a history of alcoholism and previously it seems, seizures were felt to be secondary to his alcohol abuse. He is not, however, currently drinking. CT scan shows multiple previous strokes. This is likely the reason for seizure. Neurology has been consulted. They have initiated Keppra.  Patient had an  episode of bradycardia, hypotension during transport. It's not clear if he was on the monitor at that time, arrhythmia cannot be ruled out. Vital signs have improved. He is not hypotensive here in the ER. He is borderline bradycardic, but tolerating it well. Will observe the patient in hospital overnight on telemetry.    Gilda Crease, MD 05/15/14 1531

## 2014-05-16 ENCOUNTER — Encounter (HOSPITAL_COMMUNITY): Payer: Self-pay | Admitting: Internal Medicine

## 2014-05-16 DIAGNOSIS — R001 Bradycardia, unspecified: Secondary | ICD-10-CM | POA: Diagnosis not present

## 2014-05-16 DIAGNOSIS — I63529 Cerebral infarction due to unspecified occlusion or stenosis of unspecified anterior cerebral artery: Secondary | ICD-10-CM | POA: Insufficient documentation

## 2014-05-16 DIAGNOSIS — H409 Unspecified glaucoma: Secondary | ICD-10-CM

## 2014-05-16 DIAGNOSIS — I480 Paroxysmal atrial fibrillation: Secondary | ICD-10-CM | POA: Insufficient documentation

## 2014-05-16 DIAGNOSIS — R61 Generalized hyperhidrosis: Secondary | ICD-10-CM

## 2014-05-16 DIAGNOSIS — R569 Unspecified convulsions: Secondary | ICD-10-CM | POA: Diagnosis not present

## 2014-05-16 DIAGNOSIS — F0391 Unspecified dementia with behavioral disturbance: Secondary | ICD-10-CM | POA: Insufficient documentation

## 2014-05-16 DIAGNOSIS — F1010 Alcohol abuse, uncomplicated: Secondary | ICD-10-CM

## 2014-05-16 DIAGNOSIS — G309 Alzheimer's disease, unspecified: Secondary | ICD-10-CM

## 2014-05-16 DIAGNOSIS — F0280 Dementia in other diseases classified elsewhere without behavioral disturbance: Secondary | ICD-10-CM

## 2014-05-16 DIAGNOSIS — I639 Cerebral infarction, unspecified: Secondary | ICD-10-CM | POA: Diagnosis not present

## 2014-05-16 DIAGNOSIS — Z8673 Personal history of transient ischemic attack (TIA), and cerebral infarction without residual deficits: Secondary | ICD-10-CM | POA: Diagnosis not present

## 2014-05-16 DIAGNOSIS — F03918 Unspecified dementia, unspecified severity, with other behavioral disturbance: Secondary | ICD-10-CM | POA: Insufficient documentation

## 2014-05-16 DIAGNOSIS — I10 Essential (primary) hypertension: Secondary | ICD-10-CM

## 2014-05-16 MED ORDER — ASPIRIN 325 MG PO TABS: 325.0000 mg | ORAL_TABLET | Freq: Every day | ORAL | Status: DC

## 2014-05-16 MED ORDER — LEVETIRACETAM 500 MG PO TABS: 500.0000 mg | ORAL_TABLET | Freq: Two times a day (BID) | ORAL | Status: DC

## 2014-05-16 NOTE — Social Work (Signed)
[facility] returning [facility] (under obs for less than 24 hours). CSW facilitated discharge process.   No further CSW needs due to patient discharge.   Beverly Sessionsywan J Gearld Kerstein MSW, LCSW

## 2014-05-16 NOTE — Progress Notes (Signed)
UR completed 

## 2014-05-16 NOTE — Progress Notes (Signed)
Writer tried to call report to the facility as patient is being d/c back to the facility. Writer unable to give RN to RN because there is no  Charity fundraiserN in the facility. Social worker and charge notified.

## 2014-05-16 NOTE — Progress Notes (Signed)
Active Problems:   Seizure   Seizures   Length of Stay (days): 1   SUBJECTIVE/24 HOUR EVENTS: 74 y.o. male with PMH significant for seizures, Alzheimer's Disease, HTN, HLD, atrial fibrillation and history of alcoholism brought to the ED yesterday for a brief period of unresponsiveness w/ rigidity and shaking of his extremities, gazing to the left side and incontinence of stool during the episode, diagnosed as a likely seizure due to his past CVAs based on neurologies suggestions and the results of the CT scan w/o contrast that he received yesterday. He also had noted bradycardia and hypotension and was noted to not be on anticoagulation therapy in the setting of atrial fib with history of HTN, CVAs and slightly dilated right atrium on echo from 2013. Patient was very detached and tired during our encounter, with continued slurred, tangential speech. He was unaware of why he was in the hospital and has no recollection of presumed seizure. He was no longer complaining of cold symptoms. 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 Patient's memory shows signs of dementia/Alzheimer's, and conversation is very blunted.    OBJECTIVE: Filed Vitals:   05/15/14 2141 05/16/14 0122 05/16/14 0518 05/16/14 0930  BP: 139/76 117/51 99/52 95/48  Pulse: 81 45 61 60  Temp: 99.5 F (37.5 C) 98.7 F (37.1 C) 98.6 F (37 C) 98.1 F (36.7 C)  TempSrc: Oral Oral Oral Oral  Resp: 18 16 16 18  Height:      Weight:      SpO2: 100% 93% 94% 96%   No intake or output data in the 24 hours ending 05/16/14 0957  No Known Allergies   Medications:  Current facility-administered medications:  .  aspirin tablet 325 mg, 325 mg, Oral, Daily, Julia E Mallory, MD, 325 mg at 05/16/14 0954 .  donepezil (ARICEPT) tablet 10 mg, 10 mg, Oral, QHS, Ejiroghene E Emokpae, MD, 10 mg at 05/15/14 2130 .  enoxaparin  (LOVENOX) injection 40 mg, 40 mg, Subcutaneous, Q24H, Ejiroghene E Emokpae, MD .  folic acid (FOLVITE) tablet 1 mg, 1 mg, Oral, Daily, Ejiroghene E Emokpae, MD, 1 mg at 05/16/14 0954 .  latanoprost (XALATAN) 0.005 % ophthalmic solution 1 drop, 1 drop, Both Eyes, QHS, Julia E Mallory, MD, 1 drop at 05/15/14 2131 .  levETIRAcetam (KEPPRA) tablet 500 mg, 500 mg, Oral, BID, David R Smith, PA-C, 500 mg at 05/16/14 0954 .  LORazepam (ATIVAN) tablet 1 mg, 1 mg, Oral, Q6H PRN **OR** LORazepam (ATIVAN) injection 1 mg, 1 mg, Intravenous, Q6H PRN, Ejiroghene E Emokpae, MD .  multivitamin with minerals tablet 1 tablet, 1 tablet, Oral, Daily, Ejiroghene E Emokpae, MD, 1 tablet at 05/16/14 0954 .  PARoxetine (PAXIL) tablet 10 mg, 10 mg, Oral, Daily, Ejiroghene E Emokpae, MD .  thiamine (VITAMIN B-1) tablet 100 mg, 100 mg, Oral, Daily, 100 mg at 05/16/14 0954 **OR** thiamine (B-1) injection 100 mg, 100 mg, Intravenous, Daily, Ejiroghene E Emokpae, MD   Physical Exam: GEN: NAD, WDWN, lying in bed and seems very detached from conversation, blunted affect, makes minimal eye contact.  CV: RRR, S1S2nl, no murmurs/rubs/gallops PULM: clear to auscultation b/l, no rales/rhonchi/wheezes ABD: soft, ND/NT, no rebound, no guarding EXT: no cyanosis, clubbing or edema  NEURO: unchanged   PSYCH: blunt/detached, slurred speech/difficult to understand MSK: no joint swelling or erythema  Skin: intact, warm   Labs:    CBC Latest Ref Rng 05/15/2014 10/06/2013 09/09/2013  WBC 4.0 - 10.5 K/uL 8.0 - 6.2  Hemoglobin 13.0 - 17.0 g/dL 13.9 17.0 14.4  Hematocrit 39.0 - 52.0 % 41.2 50.0 42.2  Platelets 150 - 400 K/uL 200 - 185   CMP Latest Ref Rng 05/15/2014 10/06/2013 09/09/2013  Glucose 70 - 99 mg/dL 126(H) 114(H) 107(H)  BUN 6 - 23 mg/dL 10 13 14  Creatinine 0.50 - 1.35 mg/dL 1.18 1.30 1.03  Sodium 135 - 145 mmol/L 139 143 138  Potassium 3.5 - 5.1 mmol/L 3.9 4.4 4.0  Chloride 96 - 112 mmol/L 104 104 98  CO2 19 - 32 mmol/L 29 - 28   Calcium 8.4 - 10.5 mg/dL 9.1 - 9.1  Total Protein 6.0 - 8.3 g/dL 6.9 - -  Total Bilirubin 0.3 - 1.2 mg/dL 0.2(L) - -  Alkaline Phos 39 - 117 U/L 74 - -  AST 0 - 37 U/L 19 - -  ALT 0 - 53 U/L 9 - -   Hepatic Function Latest Ref Rng 05/15/2014 04/19/2013 11/24/2012  Total Protein 6.0 - 8.3 g/dL 6.9 9.1(H) 6.8  Albumin 3.5 - 5.2 g/dL 3.4(L) 4.1 3.2(L)  AST 0 - 37 U/L 19 18 15  ALT 0 - 53 U/L 9 8 6  Alk Phosphatase 39 - 117 U/L 74 82 59  Total Bilirubin 0.3 - 1.2 mg/dL 0.2(L) 0.4 0.3   Lab results from overnight:  Magnesium 2.0, phosphorus 3.2, TSH 0.735  Medications, Vitals, Labs, and Images reviewed.    ASSESSMENT AND PLAN: 74 y.o. male w/ a PMH significant for CVAs, seizures, atrial fibrillation in the setting of no anticoagulation therapy, HTN, HLD, Alzheimer's disease and history of alcohol abuse who presented to the ED from nursing home after an observed period of unresponsiveness w/ rigidity and shaking of his extremities, gazing to the left side and incontinence of stool during the episode that occurred yesterday. Given his symptoms, along with a lack of focal neurological deficits, his personal reported history of seizures, and his stable condition over night, seizure remains the most likely diagnosis.   Seizure Activity: Continue Keppra 500mg bid and contact his PCP/SNF for better history. Monitor for continued seizure activity.   HTN: Continue ASA 325 daily. Restart norvasc 5mg daily and lisinopril 5mg daily.  DVT prophylaxis: Lovenox 40mg daily   Atrial Fibrillation: Patient's CHA2DS2-VASc score is 4 - moderate/high risk. Needs to be on anticoagulation therapy such as warfarin. Will determine who his PCP is and recommend adding anticoag to daily medications. Consider repeating echo for determination of worsened atrial enlargement for possible thrombus/stroke risk.   Alzheimer's Disease: Continue Aricept 10mg at bedtime. Hold Paxil 10mg daily due to increased risk for lowering  seizure threshold. Consider adding Namenda for better coverage given the progression of his disease.    Alcohol Abuse: Urine screen was negative. Counsel patient on importance of abstinence given his comorbidities. Continue to monitor for signs of withdrawal. Ativan prn.   Tobacco Abuse: Counsel/assist on quitting given his comorbidities.   HIV antibody and HgbA1c labs pending.   Patient was placed on a heart healthy diet.    

## 2014-05-16 NOTE — Progress Notes (Addendum)
Subjective: Joshua Gonzales feels "ok" this morning. He does not recall any of yesterday's events. He gives [place] permission to contact his niece, Joshua Gonzales.  Interval Events:  - Joshua Gonzales (niece) very helpful; patient resides at [place] (since 11/2013) - Patient has had falls in the past - Patient has not been drinking any alcohol while at [facility] - Patient has never been known to have seizures in the past  Objective: Vital signs in last 24 hours: Filed Vitals:   05/15/14 2141 05/16/14 0122 05/16/14 0518 05/16/14 0930  BP: 139/76 117/51 99/52 95/48   Pulse: 81 45 61 60  Temp: 99.5 F (37.5 C) 98.7 F (37.1 C) 98.6 F (37 C) 98.1 F (36.7 C)  TempSrc: Oral Oral Oral Oral  Resp: Height:      Weight:      SpO2: 100% 93% 94% 96%   Weight change:  No intake or output data in the 24 hours ending 05/16/14 1133  Physical Exam: Appearance: in NAD with TV on, sleeping HEENT: AT/[place], PERRL, EOMi, no JVD, no carotid bruits Heart: RRR, normal S1S2, no murmurs  Lungs: CTAB, no wheezes Abdomen: BS+, soft, nontender Musculoskeletal: scar on thoracic spine (skin excision?), small lipoma  Extremities: no edema Neurologic: A&Ox 2 (oriented to [facility], name, not hospital name) I: smell Not tested  II: visual acuity  Appears intact OU  II: visual fields Full to finger counting OU  II: pupils Equal, round, reactive to light  III,VII: ptosis None  III,IV,VI: extraocular muscles  Full ROM  V: mastication Not tested  V: facial light touch sensation  Normal  V,VII: corneal reflex  Not tested  VII: facial muscle function - upper  Normal  VII: facial muscle function - lower Normal  VIII: hearing Full and symmetric  IX: soft palate elevation  Normal  IX,X: gag reflex Not tested  XI: trapezius strength  5/5  XI: sternocleidomastoid strength 5/5  XI: neck flexion strength  5/5  XII: tongue strength  Normal    Strength 5/5 throughout, sensation full throughout, babinskis downgoing b/l, FNF intact, gait normal, slurred speech        Lab Results: Basic Metabolic Panel:  Recent Labs Lab 05/15/14 1303 05/15/14 1920  NA 139  --   K 3.9  --   CL 104  --   CO2 29  --   GLUCOSE 126*  --   BUN 10  --   CREATININE 1.18  --   CALCIUM 9.1  --   MG  --  2.0  PHOS  --  3.2   Liver Function Tests:  Recent Labs Lab 05/15/14 1303  AST 19  ALT 9  ALKPHOS 74  BILITOT 0.2*  PROT 6.9  ALBUMIN 3.4*   CBC:  Recent Labs Lab 05/15/14 1303  WBC 8.0  NEUTROABS 6.3  HGB 13.9  HCT 41.2  MCV 92.2  PLT 200   Cardiac Enzymes:  Recent Labs Lab 05/15/14 1303  TROPONINI <0.03   Thyroid Function Tests:  Recent Labs Lab 05/15/14 1920  TSH 0.735   Urine Drug Screen: Drugs of Abuse     Component Value Date/Time   LABOPIA NONE DETECTED 04/20/2013 0015   COCAINSCRNUR NONE DETECTED 04/20/2013 0015   LABBENZ NONE DETECTED 04/20/2013 0015   AMPHETMU NONE DETECTED 04/20/2013 0015   THCU NONE DETECTED 04/20/2013 0015   LABBARB NONE DETECTED 04/20/2013 0015    Alcohol Level:  Recent Labs Lab 05/15/14 1303  ETH <5   Urinalysis:  Recent Labs Lab 05/15/14 1156  COLORURINE YELLOW  LABSPEC 1.019  PHURINE 5.5  GLUCOSEU NEGATIVE  HGBUR NEGATIVE  BILIRUBINUR NEGATIVE  KETONESUR NEGATIVE  PROTEINUR NEGATIVE  UROBILINOGEN 1.0  NITRITE NEGATIVE  LEUKOCYTESUR NEGATIVE   Micro Results: No results found for this or any previous visit (from the past 240 hour(s)). Studies/Results: Ct Head Wo Contrast  05/15/2014   CLINICAL DATA:  Sudden arm stenting and gaze to the left side, possible stroke. Possible seizure. Fecal incontinence. History of dementia. Initially hypotensive and bradycardic.  EXAM: CT HEAD WITHOUT CONTRAST  TECHNIQUE: Contiguous axial images were obtained from the base of the skull through the vertex without intravenous contrast.  COMPARISON:  04/20/2013  FINDINGS:  Remote infarcts, left inferior cerebellum and left PCA distribution. Scattered remote lacunar infarcts involving the thalami, right lentiform nucleus, anterior limb left internal capsule, and bilateral caudate heads. Remote infarct, right parietal lobe. Periventricular white matter and corona radiata hypodensities favor chronic ischemic microvascular white matter disease.  No intracranial hemorrhage, mass lesion, or acute CVA. There is atherosclerotic calcification of the cavernous carotid arteries bilaterally.  IMPRESSION: 1. Multiple remote infarcts, chronic microvascular white matter disease, and remote lacunar infarcts; no acute intracranial findings are identified.   Electronically Signed   By: Gaylyn Rong M.D.   On: 05/15/2014 13:09   Medications: I have reviewed the patient's current medications. Scheduled Meds: . aspirin  325 mg Oral Daily  . donepezil  10 mg Oral QHS  . enoxaparin (LOVENOX) injection  40 mg Subcutaneous Q24H  . folic acid  1 mg Oral Daily  . latanoprost  1 drop Both Eyes QHS  . levETIRAcetam  500 mg Oral BID  . multivitamin with minerals  1 tablet Oral Daily  . PARoxetine  10 mg Oral Daily  . thiamine  100 mg Oral Daily   Or  . thiamine  100 mg Intravenous Daily   Continuous Infusions:  PRN Meds:.LORazepam **OR** LORazepam Assessment/Plan: Active Problems:   Seizure Other Problems:   History of CVA   Paroxysmal Atrial Fibrillation   Hypertension   Alzheimer's Disease  Joshua Gonzales is a 74 yo man with a history of multiple CVAs, atrial fibrillation without anticoagulation and significant dementia who presented after an observed episode of unresponsiveness with limb shaking and loss of continence. He has had no further seizure-events and is ready for discharge back to [facility].  Observed Seizure Activity: Given his prior CVAs, particular that in the right parietal region, his left-gaze shaking is concerning for true seizure. His loss of continence is  also suggestive of this. He has a history of alcohol abuse, but has had no recent alcohol use; ethanol <5 on admission, no signs of withdrawal. LA WNL, electrolytes WNL. Cough present, but no evidence of infection on exam, WBC 8.0. Afebrile. Phosphorus, magnesium and TSH WNL. The only medication that he is on that has been seen to lower the seizure threshold is his SSRI. - Initiate anti-epileptic drug; load 1000 mg then follow with 500 mg BID - IVF d/c - Seizure precautions and neuro checks - Appreciate neurology consult  History of Multiple CVAs: Being treated for hypertension, former smoker. Most recent A1c 6.7% in 2014. - On aspirin 325 mg daily for secondary stroke prevention; continue here - HgbA1c pending  Paroxysmal Atrial Fibrillation: Not on anticoagulation. Dilated atrium on 2014 echo. Has a Chadsvasc of 4 (a/c recommended). Periodic afib on telemetry. Per niece, several falls at home prior to SNF placement. History of seizures and  alcoholism make him a poor candidate for a/c. - Relay all of this to PCP  Bradycardia and Diaphoresis: Resolved. Currently pulse of 60. - Continue to follow VS  Hypertension: Patient currently 114/77. On norvasc 5 mg daily and lisinopril 5 mg daily at home. - Hold antihypertensives tonight; can resume at discharge  Alzheimer's Disease: On Aricept at home. - Continue home Aricept 10 mg at bedtime - Continue home Paxil 10 mg daily, but keep in mind that SSRI have on rare occasion been known to lower seizure threshold - Consider adding Namenda? Relay to PCP  Glaucoma: On xalatan OU, presumably for glaucoma. - Continue xalatan in hospital  History of Alcohol Abuse: Alcohol negative on this admission.   Diet: HH  DVT Ppx: lovenox   Dispo: Disposition is deferred at this time, awaiting improvement of current medical problems.  Anticipated discharge in approximately 0 day(s).   The patient [facility] and [facility] after discharge.  The patient does have transportation limitations that hinder transportation to clinic appointments.  .Services Needed at time of discharge: Y = Yes, Blank = No PT:   OT:   RN:   Equipment:   Other:       Stark BrayJulia E Onyx Schirmer, MD 05/16/2014, 11:33 AM

## 2014-05-16 NOTE — Progress Notes (Signed)
NEURO HOSPITALIST PROGRESS NOTE   SUBJECTIVE:                                                                                                                        No further seizures noted. On keppra 500 mg BID without noticeable side effects. He offers no complains this morning.  OBJECTIVE:                                                                                                                           Vital signs in last 24 hours: Temp:  [98.1 F (36.7 C)-99.5 F (37.5 C)] 98.6 F (37 C) (03/12 0518) Pulse Rate:  [45-97] 61 (03/12 0518) Resp:  [13-21] 16 (03/12 0518) BP: (99-152)/(51-98) 99/52 mmHg (03/12 0518) SpO2:  [90 %-100 %] 94 % (03/12 0518) Weight:  [79.379 kg (175 lb)] 79.379 kg (175 lb) (03/11 1700)  Intake/Output from previous day:   Intake/Output this shift:   Nutritional status: Diet Heart  Past Medical History  Diagnosis Date  . Hypertension   . Seizures   . Alzheimer disease   Physical examination: HEENT- Normocephalic, no lesions, without obvious abnormality. Normal external eye and conjunctiva. Normal TM's bilaterally. Normal auditory canals and external ears. Normal external nose, mucus membranes and septum. Normal pharynx. Cardiovascular- irregularly irregular rhythm, pulses palpable throughout  Lungs- chest clear, no wheezing, rales, normal symmetric air entry, Heart exam - S1, S2 normal, no murmur, no gallop, rate regular Abdomen- normal findings: bowel sounds normal Extremities- no edema Lymph-no adenopathy palpable Musculoskeletal-no joint tenderness, deformity or swelling Skin-warm and dry, no hyperpigmentation, vitiligo, or suspicious lesions  Neurologic Exam:  Mental Status: Alert, not oriented to place or year, able to state he has had seizure but not able to give detailed history. Speech fluent without evidence of aphasia. Able to follow 3 step commands without difficulty. Cranial  Nerves: II: Discs flat bilaterally; Visual fields grossly normal, pupils equal, round, reactive to light and accommodation III,IV, VI: ptosis not present, extra-ocular motions intact bilaterally V,VII: smile symmetric, facial light touch sensation normal bilaterally VIII: hearing normal bilaterally IX,X: uvula rises symmetrically XI: bilateral shoulder shrug XII: midline tongue extension Motor: Right :Upper extremity 5/5Left: Upper extremity 5/5 Lower extremity 5/5Lower extremity 5/5  Tone and bulk:normal tone throughout; no atrophy noted Sensory: Pinprick and light touch intact throughout, bilaterally Deep Tendon Reflexes: 2+ and symmetric throughout Plantars: Right: downgoingLeft: downgoing Cerebellar: normal finger-to-nose,and normal heel-to-shin test Gait: not tested due to safety  Lab Results: Lab Results  Component Value Date/Time   CHOL 117 11/24/2012 02:27 AM   Lipid Panel No results for input(s): CHOL, TRIG, HDL, CHOLHDL, VLDL, LDLCALC in the last 72 hours.  Studies/Results: Ct Head Wo Contrast  05/15/2014   CLINICAL DATA:  Sudden arm stenting and gaze to the left side, possible stroke. Possible seizure. Fecal incontinence. History of dementia. Initially hypotensive and bradycardic.  EXAM: CT HEAD WITHOUT CONTRAST  TECHNIQUE: Contiguous axial images were obtained from the base of the skull through the vertex without intravenous contrast.  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 Scattered remote lacunar infarcts involving the thalami, right lentiform nucleus, anterior limb left internal capsule, and bilateral caudate heads. Remote infarct, right parietal lobe. Periventricular white matter and corona radiata hypodensities favor chronic ischemic microvascular white matter  disease.  No intracranial hemorrhage, mass lesion, or acute CVA. There is atherosclerotic calcification of the cavernous carotid arteries bilaterally.  IMPRESSION: 1. Multiple remote infarcts, chronic microvascular white matter disease, and remote lacunar infarcts; no acute intracranial findings are identified.   Electronically Signed   By: Gaylyn Rong M.D.   On: 05/15/2014 13:09    MEDICATIONS                                                                                                                        Scheduled: . aspirin  325 mg Oral Daily  . donepezil  10 mg Oral QHS  . enoxaparin (LOVENOX) injection  40 mg Subcutaneous Q24H  . folic acid  1 mg Oral Daily  . latanoprost  1 drop Both Eyes QHS  . levETIRAcetam  500 mg Oral BID  . multivitamin with minerals  1 tablet Oral Daily  . PARoxetine  10 mg Oral Daily  . thiamine  100 mg Oral Daily   Or  . thiamine  100 mg Intravenous Daily    ASSESSMENT/PLAN:                                                                                                            74 y/o with remote symptomatic post stroke seizures. No further seizures noted. Continue keppra 500 mg BID. Neurology will sign off.  Wyatt Portela, MD [facility] [phone]  05/16/2014, 9:14 AM

## 2014-05-16 NOTE — Discharge Instructions (Signed)
Please take your keppra 500 mg twice each day to prevent other seizures.  Epilepsy Epilepsy is a disorder in which a person has repeated seizures over time. A seizure is a release of abnormal electrical activity in the brain. Seizures can cause a change in attention, behavior, or the ability to remain awake and alert (altered mental status). Seizures often involve uncontrollable shaking (convulsions).  Most people with epilepsy lead normal lives. However, people with epilepsy are at an increased risk of falls, accidents, and injuries. Therefore, it is important to begin treatment right away. CAUSES  Epilepsy has many possible causes. Anything that disturbs the normal pattern of brain cell activity can lead to seizures. This may include:   Head injury.  Birth trauma.  High fever as a child.  Stroke.  Bleeding into or around the brain.  Certain drugs.  Prolonged low oxygen, such as what occurs after CPR efforts.  Abnormal brain development.  Certain illnesses, such as meningitis, encephalitis (brain infection), malaria, and other infections.  An imbalance of nerve signaling chemicals (neurotransmitters).  SIGNS AND SYMPTOMS  The symptoms of a seizure can vary greatly from one person to another. Right before a seizure, you may have a warning (aura) that a seizure is about to occur. An aura may include the following symptoms:  Fear or anxiety.  Nausea.  Feeling like the room is spinning (vertigo).  Vision changes, such as seeing flashing lights or spots. Common symptoms during a seizure include:  Abnormal sensations, such as an abnormal smell or a bitter taste in the mouth.   Sudden, general body stiffness.   Convulsions that involve rhythmic jerking of the face, arm, or leg on one or both sides.   Sudden change in consciousness.   Appearing to be awake but not responding.   Appearing to be asleep but cannot be awakened.   Grimacing, chewing, lip smacking,  drooling, tongue biting, or loss of bowel or bladder control. After a seizure, you may feel sleepy for a while. DIAGNOSIS  Your health care provider will ask about your symptoms and take a medical history. Descriptions from any witnesses to your seizures will be very helpful in the diagnosis. A physical exam, including a detailed neurological exam, is necessary. Various tests may be done, such as:   An electroencephalogram (EEG). This is a painless test of your brain waves. In this test, a diagram is created of your brain waves. These diagrams can be interpreted by a specialist.  An MRI of the brain.   A CT scan of the brain.   A spinal tap (lumbar puncture, LP).  Blood tests to check for signs of infection or abnormal blood chemistry. TREATMENT  There is no cure for epilepsy, but it is generally treatable. Once epilepsy is diagnosed, it is important to begin treatment as soon as possible. For most people with epilepsy, seizures can be controlled with medicines. The following may also be used:  A pacemaker for the brain (vagus nerve stimulator) can be used for people with seizures that are not well controlled by medicine.  Surgery on the brain. For some people, epilepsy eventually goes away. HOME CARE INSTRUCTIONS   Follow your health care provider's recommendations on driving and safety in normal activities.  Get enough rest. Lack of sleep can cause seizures.  Only take over-the-counter or prescription medicines as directed by your health care provider. Take any prescribed medicine exactly as directed.  Avoid any known triggers of your seizures.  Keep a seizure diary.  Record what you recall about any seizure, especially any possible trigger.   Make sure the people you live and work with know that you are prone to seizures. They should receive instructions on how to help you. In general, a witness to a seizure should:   Cushion your head and body.   Turn you on your side.    Avoid unnecessarily restraining you.   Not place anything inside your mouth.   Call for emergency medical help if there is any question about what has occurred.   Follow up with your health care provider as directed. You may need regular blood tests to monitor the levels of your medicine.  SEEK MEDICAL CARE IF:   You develop signs of infection or other illness. This might increase the risk of a seizure.   You seem to be having more frequent seizures.   Your seizure pattern is changing.  SEEK IMMEDIATE MEDICAL CARE IF:   You have a seizure that does not stop after a few moments.   You have a seizure that causes any difficulty in breathing.   You have a seizure that results in a very severe headache.   You have a seizure that leaves you with the inability to speak or use a part of your body.  Document Released: 02/20/2005 Document Revised: 12/11/2012 Document Reviewed: 10/02/2012 [facility] Patient Information 2015 [place], [place]. This information is not intended to replace advice given to you by your health care provider. Make sure you discuss any questions you have with your health care provider.

## 2014-05-16 NOTE — Discharge Summary (Signed)
Name: Joshua Gonzales MRN: [id number] DOB: [birth year] 74 y.o. PCP: Pcp Not In System  Date of Admission: 05/15/2014 10:38 AM Date of Discharge: 05/16/2014 Attending Physician: Randall Hiss, MD  Discharge Diagnosis: Active Problems:   Seizure   Partial anterior cerebral circulation infarction   Dementia with behavioral disturbance   PAF (paroxysmal atrial fibrillation)  Discharge Medications:   Medication List    TAKE these medications        acetaminophen 325 MG tablet  Commonly known as:  TYLENOL  Take 650 mg by mouth every 6 (six) hours as needed for mild pain.     amLODipine 5 MG tablet  Commonly known as:  NORVASC  Take 2 tablets (10 mg total) by mouth daily.     aspirin 325 MG tablet  Take 1 tablet (325 mg total) by mouth daily.     donepezil 10 MG tablet  Commonly known as:  ARICEPT  Take 10 mg by mouth at bedtime.     latanoprost 0.005 % ophthalmic solution  Commonly known as:  XALATAN  Place 1 drop into both eyes at bedtime.     levETIRAcetam 500 MG tablet  Commonly known as:  KEPPRA  Take 1 tablet (500 mg total) by mouth 2 (two) times daily.     lisinopril 5 MG tablet  Commonly known as:  PRINIVIL,ZESTRIL  Take 5 mg by mouth daily.     PARoxetine 10 MG tablet  Commonly known as:  PAXIL  Take 10 mg by mouth daily.     Vitamin D (Ergocalciferol) 50000 UNITS Caps capsule  Commonly known as:  DRISDOL  Take 50,000 Units by mouth every 7 (seven) days.        Disposition and follow-up:   Joshua Gonzales was discharged from [facility] in Stable condition.  At the hospital follow up visit please address:  1.  New Seizure: Started on keppra 500 mg BID.  History of CVA and Atrial Fibrillation: Please follow up lipid panel and start on statin if necessary. We started him on a daily aspirin 325 mg. Had a dilated atrium on echo in 2014. Patient has a chads2vasc of 4 - would be a candidate for anticoagulation, but assume that with  fall risk and seizure history, he is not a candidate. Please consider.  Hypertension: Patient to resume home medications at discharge; was normotensive here. Please trend BP.  Dementia: Question whether adding Namenda to Aricept would benefit this patient?   2.  Labs / imaging needed at time of follow-up: none  3.  Pending labs/ test needing follow-up: HgbA1c and lipid panel  Follow-up Appointments: At [facility]  Discharge Instructions:  Please take your keppra 500 mg twice each day to prevent other seizures.  Epilepsy Epilepsy is a disorder in which a person has repeated seizures over time. A seizure is a release of abnormal electrical activity in the brain. Seizures can cause a change in attention, behavior, or the ability to remain awake and alert (altered mental status). Seizures often involve uncontrollable shaking (convulsions).  Most people with epilepsy lead normal lives. However, people with epilepsy are at an increased risk of falls, accidents, and injuries. Therefore, it is important to begin treatment right away. CAUSES  Epilepsy has many possible causes. Anything that disturbs the normal pattern of brain cell activity can lead to seizures. This may include:   Head injury.  Birth trauma.  High fever as a child.  Stroke.  Bleeding into or  around the brain.  Certain drugs.  Prolonged low oxygen, such as what occurs after CPR efforts.  Abnormal brain development.  Certain illnesses, such as meningitis, encephalitis (brain infection), malaria, and other infections.  An imbalance of nerve signaling chemicals (neurotransmitters).  SIGNS AND SYMPTOMS  The symptoms of a seizure can vary greatly from one person to another. Right before a seizure, you may have a warning (aura) that a seizure is about to occur. An aura may include the following symptoms:  Fear or anxiety.  Nausea.  Feeling like the room is spinning (vertigo).  Vision changes, such as seeing  flashing lights or spots. Common symptoms during a seizure include:  Abnormal sensations, such as an abnormal smell or a bitter taste in the mouth.   Sudden, general body stiffness.   Convulsions that involve rhythmic jerking of the face, arm, or leg on one or both sides.   Sudden change in consciousness.   Appearing to be awake but not responding.   Appearing to be asleep but cannot be awakened.   Grimacing, chewing, lip smacking, drooling, tongue biting, or loss of bowel or bladder control. After a seizure, you may feel sleepy for a while. DIAGNOSIS  Your health care provider will ask about your symptoms and take a medical history. Descriptions from any witnesses to your seizures will be very helpful in the diagnosis. A physical exam, including a detailed neurological exam, is necessary. Various tests may be done, such as:   An electroencephalogram (EEG). This is a painless test of your brain waves. In this test, a diagram is created of your brain waves. These diagrams can be interpreted by a specialist.  An MRI of the brain.   A CT scan of the brain.   A spinal tap (lumbar puncture, LP).  Blood tests to check for signs of infection or abnormal blood chemistry. TREATMENT  There is no cure for epilepsy, but it is generally treatable. Once epilepsy is diagnosed, it is important to begin treatment as soon as possible. For most people with epilepsy, seizures can be controlled with medicines. The following may also be used:  A pacemaker for the brain (vagus nerve stimulator) can be used for people with seizures that are not well controlled by medicine.  Surgery on the brain. For some people, epilepsy eventually goes away. HOME CARE INSTRUCTIONS   Follow your health care provider's recommendations on driving and safety in normal activities.  Get enough rest. Lack of sleep can cause seizures.  Only take over-the-counter or prescription medicines as directed by your  health care provider. Take any prescribed medicine exactly as directed.  Avoid any known triggers of your seizures.  Keep a seizure diary. Record what you recall about any seizure, especially any possible trigger.   Make sure the people you live and work with know that you are prone to seizures. They should receive instructions on how to help you. In general, a witness to a seizure should:   Cushion your head and body.   Turn you on your side.   Avoid unnecessarily restraining you.   Not place anything inside your mouth.   Call for emergency medical help if there is any question about what has occurred.   Follow up with your health care provider as directed. You may need regular blood tests to monitor the levels of your medicine.  SEEK MEDICAL CARE IF:   You develop signs of infection or other illness. This might increase the risk of a seizure.  You seem to be having more frequent seizures.   Your seizure pattern is changing.  SEEK IMMEDIATE MEDICAL CARE IF:   You have a seizure that does not stop after a few moments.   You have a seizure that causes any difficulty in breathing.   You have a seizure that results in a very severe headache.   You have a seizure that leaves you with the inability to speak or use a part of your body.  Document Released: 02/20/2005 Document Revised: 12/11/2012 Document Reviewed: 10/02/2012 [facility] Patient Information 2015 [place], [place]. This information is not intended to replace advice given to you by your health care provider. Make sure you discuss any questions you have with your health care provider.    Consultations:    Procedures Performed:  Ct Head Wo Contrast  05/15/2014   CLINICAL DATA:  Sudden arm stenting and gaze to the left side, possible stroke. Possible seizure. Fecal incontinence. History of dementia. Initially hypotensive and bradycardic.  EXAM: CT HEAD WITHOUT CONTRAST  TECHNIQUE: Contiguous axial images  were obtained from the base of the skull through the vertex without intravenous contrast.  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 Scattered remote lacunar infarcts involving the thalami, right lentiform nucleus, anterior limb left internal capsule, and bilateral caudate heads. Remote infarct, right parietal lobe. Periventricular white matter and corona radiata hypodensities favor chronic ischemic microvascular white matter disease.  No intracranial hemorrhage, mass lesion, or acute CVA. There is atherosclerotic calcification of the cavernous carotid arteries bilaterally.  IMPRESSION: 1. Multiple remote infarcts, chronic microvascular white matter disease, and remote lacunar infarcts; no acute intracranial findings are identified.   Electronically Signed   By: Gaylyn Rong M.D.   On: 05/15/2014 13:09    Admission HPI: Joshua Gonzales is a 74 yo man with a history of multiple CVAs, atrial fibrillation (not on anticoagulation) and dementia who was brought to the ED by EMS after a period of unresponsiveness at his SNF earlier today. During the episode, his extremities were noted to be shaking and he was gazing to the left. He lost continence of his stools. By the time EMS arrived, his unresponsiveness and shaking had subsided, but he was confused.   There is mention of a seizure history in the patient's prior ED visits, but this history is always per the patient. Today, he states that has had seizures previously, "every few days", but has never taken anti-epileptics. The patient's dementia makes this history questionable.   In the ED, he had an episode of diaphoresis and bradycardia; these signs improved with IVF, but he was admitted for observation overnight.  Hospital Course by problem list: Active Problems:   Seizure   Partial anterior cerebral circulation infarction   Dementia with behavioral disturbance   PAF (paroxysmal atrial  fibrillation)   Joshua Gonzales is a 74 yo man with a history of multiple CVAs, atrial fibrillation without anticoagulation and significant dementia who presented after an observed episode of unresponsiveness with limb shaking and loss of continence. He has had no further seizure-events after being started on keppra.   Observed Seizure Activity: Given his prior CVAs, particular that in the right parietal region, his left-gaze shaking is concerning for true seizure. His loss of continence is also suggestive of this. He has a history of alcohol abuse, but has had no recent alcohol use; ethanol <5 on admission, no signs of withdrawal. LA WNL, electrolytes WNL. Cough present, but no evidence of  infection on exam, WBC 8.0. Afebrile. Phosphorus, magnesium and TSH WNL. The only medication that he is on that has been seen to lower the seizure threshold is his SSRI. Was loaded with keppra 1000 mg; to continue with 500 mg keppra BID.  History of Multiple CVAs: Being treated for hypertension, former smoker. Most recent A1c 6.7% in 2014. HgbA1c pending. On aspirin 325 mg daily for secondary stroke prevention. No focal neurologic deficits to suggest CVA at this time.  Paroxysmal Atrial Fibrillation: Not on anticoagulation. Dilated atrium on 2014 echo. Has a Chadsvasc of 4 (a/c recommended). Periodic afib on telemetry. Per niece, several falls at home prior to SNF placement. History of seizures and alcoholism make him a poor candidate for a/c. Aspirin 325 prescribed.  Hypertension: Patient normotensive during this hospitalization with medications held. On norvasc 5 mg daily and lisinopril 5 mg daily at home.  Alzheimer's Disease: On Aricept and Paxil at home. SSRI have on rare occasion been known to lower seizure threshold, but continue for now. Question whether adding Namenda might be beneficial.  Glaucoma: On xalatan OU, presumably for glaucoma.  History of Alcohol Abuse: Alcohol negative on this admission. Not  likely to be involved as a cause of seizure.  Discharge Vitals:   BP 95/48 mmHg  Pulse 60  Temp(Src) 98.1 F (36.7 C) (Oral)  Resp 18  Ht 5\' 9"  (1.753 m)  Wt 175 lb (79.379 kg)  BMI 25.83 kg/m2  SpO2 96%  Discharge Labs:  Results for orders placed or performed during the hospital encounter of 05/15/14 (from the past 24 hour(s))  Urinalysis, Routine w reflex microscopic     Status: None   Collection Time: 05/15/14 11:56 AM  Result Value Ref Range   Color, Urine YELLOW YELLOW   APPearance CLEAR CLEAR   Specific Gravity, Urine 1.019 1.005 - 1.030   pH 5.5 5.0 - 8.0   Glucose, UA NEGATIVE NEGATIVE mg/dL   Hgb urine dipstick NEGATIVE NEGATIVE   Bilirubin Urine NEGATIVE NEGATIVE   Ketones, ur NEGATIVE NEGATIVE mg/dL   Protein, ur NEGATIVE NEGATIVE mg/dL   Urobilinogen, UA 1.0 0.0 - 1.0 mg/dL   Nitrite NEGATIVE NEGATIVE   Leukocytes, UA NEGATIVE NEGATIVE  CBC with Differential/Platelet     Status: Abnormal   Collection Time: 05/15/14  1:03 PM  Result Value Ref Range   WBC 8.0 4.0 - 10.5 K/uL   RBC 4.47 4.22 - 5.81 MIL/uL   Hemoglobin 13.9 13.0 - 17.0 g/dL   HCT 16.1 09.6 - 04.5 %   MCV 92.2 78.0 - 100.0 fL   MCH 31.1 26.0 - 34.0 pg   MCHC 33.7 30.0 - 36.0 g/dL   RDW 40.9 81.1 - 91.4 %   Platelets 200 150 - 400 K/uL   Neutrophils Relative % 79 (H) 43 - 77 %   Neutro Abs 6.3 1.7 - 7.7 K/uL   Lymphocytes Relative 15 12 - 46 %   Lymphs Abs 1.2 0.7 - 4.0 K/uL   Monocytes Relative 6 3 - 12 %   Monocytes Absolute 0.4 0.1 - 1.0 K/uL   Eosinophils Relative 0 0 - 5 %   Eosinophils Absolute 0.0 0.0 - 0.7 K/uL   Basophils Relative 0 0 - 1 %   Basophils Absolute 0.0 0.0 - 0.1 K/uL  Comprehensive metabolic panel     Status: Abnormal   Collection Time: 05/15/14  1:03 PM  Result Value Ref Range   Sodium 139 135 - 145 mmol/L   Potassium 3.9 3.5 -  5.1 mmol/L   Chloride 104 96 - 112 mmol/L   CO2 29 19 - 32 mmol/L   Glucose, Bld 126 (H) 70 - 99 mg/dL   BUN 10 6 - 23 mg/dL    Creatinine, Ser 1.61 0.50 - 1.35 mg/dL   Calcium 9.1 8.4 - 09.6 mg/dL   Total Protein 6.9 6.0 - 8.3 g/dL   Albumin 3.4 (L) 3.5 - 5.2 g/dL   AST 19 0 - 37 U/L   ALT 9 0 - 53 U/L   Alkaline Phosphatase 74 39 - 117 U/L   Total Bilirubin 0.2 (L) 0.3 - 1.2 mg/dL   GFR calc non Af Amer 60 (L) >90 mL/min   GFR calc Af Amer 70 (L) >90 mL/min   Anion gap 6 5 - 15  Troponin I     Status: None   Collection Time: 05/15/14  1:03 PM  Result Value Ref Range   Troponin I <0.03 <0.031 ng/mL  Ethanol     Status: None   Collection Time: 05/15/14  1:03 PM  Result Value Ref Range   Alcohol, Ethyl (B) <5 0 - 9 mg/dL  I-Stat CG4 Lactic Acid, ED     Status: None   Collection Time: 05/15/14  1:17 PM  Result Value Ref Range   Lactic Acid, Venous 1.71 0.5 - 2.0 mmol/L  Magnesium     Status: None   Collection Time: 05/15/14  7:20 PM  Result Value Ref Range   Magnesium 2.0 1.5 - 2.5 mg/dL  Phosphorus     Status: None   Collection Time: 05/15/14  7:20 PM  Result Value Ref Range   Phosphorus 3.2 2.3 - 4.6 mg/dL  TSH     Status: None   Collection Time: 05/15/14  7:20 PM  Result Value Ref Range   TSH 0.735 0.350 - 4.500 uIU/mL    Signed: Stark Bray, MD 05/16/2014, 11:45 AM    Services Ordered on Discharge: SNF [facility]) Equipment Ordered on Discharge: none

## 2014-05-16 NOTE — H&P (Signed)
  Date: 05/16/2014  Patient name: Joshua PartridgeJohn H Gonzales  Medical record number: [id number]  Date of birth: 22-Jul-1940   I have seen and evaluated Joshua Gonzales and discussed their care with the Residency Team.   74 year old AA man with hx of multiple CVA's atrial fibrillation (but not on anticoagulation) , dementia who is brought to the emerge department after being found to be unresponsive with episode of shaking of all his extremities gazing to the left and loss of continence. Patient has been thought to have had a seizure been started on Keppra by neurology here.  He had CT head done here which showed:   Multiple remote infarcts, chronic microvascular white matter disease, and remote lacunar infarcts; no acute intracranial findings are identified  Exam: Sleepy but arousable. He answers questions but has very poor memory unable to tell me anything about what happened yesterday not really able to do much about recent events at all. He did tell me he lives at home with her many other people. He says he does take medications when they are brought to him.  Cardiovascular exam regular rate and rhythm no murmurs or gallops rubs heard  Pulmonary clear to auscultation bilaterally  Abdomen soft nondistended  Extremities no edema  Neurological exam nonfocal other than his cognitive deficits  Assessment and Plan: I have seen and evaluated the patient as outlined above. I agree with the formulated Assessment and Plan as detailed in the residents' admission note, with the following changes:   1. Seizure: Neurology believes that his old parietal infarct may be a nidus of his seizure activity. He has been started on Keppra which we'll continue.  The patient evaluated with physical therapy occupational therapy  If he is otherwise doing well I think we can potential he discharged him back to his skilled nursing facility where he resides.  #2 history of multiple strokes continuing him on aspirin,  statin  but also with atrial fibrillation and high [place] score question whether or not he should be on anticoagulation.  #3 Dementia: on aricept but why not also on namenda, ? Utility of aricept alone   Randall Hissornelius N Van Dam, MD 3/12/201611:35 AM

## 2014-05-16 NOTE — Progress Notes (Signed)
Patient d/c back to the assisted living facility via [facility]. Patient's condition stable.

## 2014-05-17 LAB — HIV ANTIBODY (ROUTINE TESTING W REFLEX): HIV Screen 4th Generation wRfx: NONREACTIVE

## 2014-05-18 LAB — HEMOGLOBIN A1C
HEMOGLOBIN A1C: 6.5 % — AB (ref 4.8–5.6)
Mean Plasma Glucose: 140 mg/dL

## 2014-06-03 ENCOUNTER — Observation Stay (HOSPITAL_COMMUNITY)
Admission: EM | Admit: 2014-06-03 | Discharge: 2014-06-04 | Disposition: A | Discharge status: Skilled Nursing Facility | Payer: Medicare Other | Attending: Oncology | Admitting: Oncology

## 2014-06-03 ENCOUNTER — Emergency Department (HOSPITAL_COMMUNITY): Payer: Medicare Other

## 2014-06-03 ENCOUNTER — Encounter (HOSPITAL_COMMUNITY): Payer: Self-pay | Admitting: *Deleted

## 2014-06-03 DIAGNOSIS — Z7982 Long term (current) use of aspirin: Secondary | ICD-10-CM | POA: Insufficient documentation

## 2014-06-03 DIAGNOSIS — I739 Peripheral vascular disease, unspecified: Secondary | ICD-10-CM | POA: Insufficient documentation

## 2014-06-03 DIAGNOSIS — I959 Hypotension, unspecified: Secondary | ICD-10-CM | POA: Diagnosis not present

## 2014-06-03 DIAGNOSIS — I129 Hypertensive chronic kidney disease with stage 1 through stage 4 chronic kidney disease, or unspecified chronic kidney disease: Secondary | ICD-10-CM | POA: Insufficient documentation

## 2014-06-03 DIAGNOSIS — I252 Old myocardial infarction: Secondary | ICD-10-CM | POA: Diagnosis not present

## 2014-06-03 DIAGNOSIS — Z87891 Personal history of nicotine dependence: Secondary | ICD-10-CM | POA: Insufficient documentation

## 2014-06-03 DIAGNOSIS — G40909 Epilepsy, unspecified, not intractable, without status epilepticus: Secondary | ICD-10-CM | POA: Insufficient documentation

## 2014-06-03 DIAGNOSIS — R739 Hyperglycemia, unspecified: Secondary | ICD-10-CM | POA: Insufficient documentation

## 2014-06-03 DIAGNOSIS — I480 Paroxysmal atrial fibrillation: Secondary | ICD-10-CM | POA: Insufficient documentation

## 2014-06-03 DIAGNOSIS — N179 Acute kidney failure, unspecified: Secondary | ICD-10-CM | POA: Diagnosis not present

## 2014-06-03 DIAGNOSIS — G309 Alzheimer's disease, unspecified: Secondary | ICD-10-CM | POA: Insufficient documentation

## 2014-06-03 DIAGNOSIS — E872 Acidosis, unspecified: Secondary | ICD-10-CM | POA: Insufficient documentation

## 2014-06-03 DIAGNOSIS — N182 Chronic kidney disease, stage 2 (mild): Secondary | ICD-10-CM | POA: Diagnosis not present

## 2014-06-03 DIAGNOSIS — F03918 Unspecified dementia, unspecified severity, with other behavioral disturbance: Secondary | ICD-10-CM | POA: Diagnosis present

## 2014-06-03 DIAGNOSIS — E785 Hyperlipidemia, unspecified: Secondary | ICD-10-CM | POA: Diagnosis not present

## 2014-06-03 DIAGNOSIS — E860 Dehydration: Secondary | ICD-10-CM | POA: Diagnosis not present

## 2014-06-03 DIAGNOSIS — R55 Syncope and collapse: Secondary | ICD-10-CM | POA: Diagnosis not present

## 2014-06-03 DIAGNOSIS — Z8673 Personal history of transient ischemic attack (TIA), and cerebral infarction without residual deficits: Secondary | ICD-10-CM | POA: Insufficient documentation

## 2014-06-03 DIAGNOSIS — H409 Unspecified glaucoma: Secondary | ICD-10-CM | POA: Diagnosis not present

## 2014-06-03 DIAGNOSIS — R269 Unspecified abnormalities of gait and mobility: Secondary | ICD-10-CM | POA: Diagnosis not present

## 2014-06-03 DIAGNOSIS — F0391 Unspecified dementia with behavioral disturbance: Secondary | ICD-10-CM | POA: Diagnosis not present

## 2014-06-03 DIAGNOSIS — Z9181 History of falling: Secondary | ICD-10-CM | POA: Insufficient documentation

## 2014-06-03 DIAGNOSIS — I10 Essential (primary) hypertension: Secondary | ICD-10-CM | POA: Diagnosis present

## 2014-06-03 DIAGNOSIS — Z87898 Personal history of other specified conditions: Secondary | ICD-10-CM

## 2014-06-03 LAB — CBC WITH DIFFERENTIAL/PLATELET
Basophils Absolute: 0 10*3/uL (ref 0.0–0.1)
Basophils Relative: 0 % (ref 0–1)
EOS PCT: 0 % (ref 0–5)
Eosinophils Absolute: 0 10*3/uL (ref 0.0–0.7)
HCT: 45.1 % (ref 39.0–52.0)
Hemoglobin: 14.7 g/dL (ref 13.0–17.0)
LYMPHS ABS: 2.7 10*3/uL (ref 0.7–4.0)
LYMPHS PCT: 28 % (ref 12–46)
MCH: 31 pg (ref 26.0–34.0)
MCHC: 32.6 g/dL (ref 30.0–36.0)
MCV: 95.1 fL (ref 78.0–100.0)
MONOS PCT: 8 % (ref 3–12)
Monocytes Absolute: 0.8 10*3/uL (ref 0.1–1.0)
Neutro Abs: 6.1 10*3/uL (ref 1.7–7.7)
Neutrophils Relative %: 64 % (ref 43–77)
Platelets: 174 10*3/uL (ref 150–400)
RBC: 4.74 MIL/uL (ref 4.22–5.81)
RDW: 13.6 % (ref 11.5–15.5)
WBC: 9.6 10*3/uL (ref 4.0–10.5)

## 2014-06-03 LAB — COMPREHENSIVE METABOLIC PANEL
ALBUMIN: 3.9 g/dL (ref 3.5–5.2)
ALT: 11 U/L (ref 0–53)
ANION GAP: 12 (ref 5–15)
AST: 22 U/L (ref 0–37)
Alkaline Phosphatase: 73 U/L (ref 39–117)
BUN: 19 mg/dL (ref 6–23)
CHLORIDE: 104 mmol/L (ref 96–112)
CO2: 26 mmol/L (ref 19–32)
Calcium: 9.3 mg/dL (ref 8.4–10.5)
Creatinine, Ser: 1.72 mg/dL — ABNORMAL HIGH (ref 0.50–1.35)
GFR calc non Af Amer: 38 mL/min — ABNORMAL LOW (ref >90)
GFR, EST AFRICAN AMERICAN: 44 mL/min — AB (ref >90)
GLUCOSE: 159 mg/dL — AB (ref 70–99)
POTASSIUM: 4.6 mmol/L (ref 3.5–5.1)
Sodium: 142 mmol/L (ref 135–145)
Total Bilirubin: 0.7 mg/dL (ref 0.3–1.2)
Total Protein: 7.6 g/dL (ref 6.0–8.3)

## 2014-06-03 LAB — I-STAT TROPONIN, ED: Troponin i, poc: <0.2 ng/mL (ref 0.00–0.08)

## 2014-06-03 LAB — I-STAT CG4 LACTIC ACID, ED: Lactic Acid, Venous: 5.14 mmol/L (ref 0.5–2.0)

## 2014-06-03 LAB — I-STAT CHEM 8, ED
BUN: 20 mg/dL (ref 6–23)
CREATININE: 1.5 mg/dL — AB (ref 0.50–1.35)
Calcium, Ion: 1.19 mmol/L (ref 1.13–1.30)
Chloride: 103 mmol/L (ref 96–112)
Glucose, Bld: 160 mg/dL — ABNORMAL HIGH (ref 70–99)
HCT: 47 % (ref 39.0–52.0)
Hemoglobin: 16 g/dL (ref 13.0–17.0)
POTASSIUM: 4.4 mmol/L (ref 3.5–5.1)
Sodium: 141 mmol/L (ref 135–145)
TCO2: 22 mmol/L (ref 0–100)

## 2014-06-03 LAB — LACTIC ACID, PLASMA: Lactic Acid, Venous: 1.7 mmol/L (ref 0.5–2.0)

## 2014-06-03 LAB — URINALYSIS, ROUTINE W REFLEX MICROSCOPIC
BILIRUBIN URINE: NEGATIVE
Glucose, UA: NEGATIVE mg/dL
HGB URINE DIPSTICK: NEGATIVE
Ketones, ur: NEGATIVE mg/dL
Leukocytes, UA: NEGATIVE
NITRITE: NEGATIVE
Protein, ur: NEGATIVE mg/dL
SPECIFIC GRAVITY, URINE: 1.021 (ref 1.005–1.030)
UROBILINOGEN UA: 0.2 mg/dL (ref 0.0–1.0)
pH: 5 (ref 5.0–8.0)

## 2014-06-03 LAB — CBG MONITORING, ED: Glucose-Capillary: 123 mg/dL — ABNORMAL HIGH (ref 70–99)

## 2014-06-03 LAB — TROPONIN I: Troponin I: <0.03 ng/mL (ref <0.031)

## 2014-06-03 LAB — ETHANOL: Alcohol, Ethyl (B): <5 mg/dL (ref 0–9)

## 2014-06-03 MED ORDER — LATANOPROST 0.005 % OP SOLN
1.0000 [drp] | Freq: Every day | OPHTHALMIC | Status: DC
Start: 2014-06-03 — End: 2014-06-04
  Administered 2014-06-04: 1 [drp] via OPHTHALMIC
  Filled 2014-06-03: qty 2.5

## 2014-06-03 MED ORDER — ASPIRIN 325 MG PO TABS
325.0000 mg | ORAL_TABLET | Freq: Every day | ORAL | Status: DC
  Administered 2014-06-04: 325 mg via ORAL
  Filled 2014-06-03: qty 1

## 2014-06-03 MED ORDER — VANCOMYCIN HCL IN DEXTROSE 750-5 MG/150ML-% IV SOLN
750.0000 mg | Freq: Two times a day (BID) | INTRAVENOUS | Status: DC
  Filled 2014-06-03: qty 150

## 2014-06-03 MED ORDER — SODIUM CHLORIDE 0.9 % IV BOLUS (SEPSIS)
1000.0000 mL | Freq: Once | INTRAVENOUS | Status: AC
  Administered 2014-06-03: 1000 mL via INTRAVENOUS

## 2014-06-03 MED ORDER — SODIUM CHLORIDE 0.9 % IV BOLUS (SEPSIS)
1000.0000 mL | INTRAVENOUS | Status: AC
  Administered 2014-06-03 (×2): 1000 mL via INTRAVENOUS

## 2014-06-03 MED ORDER — HEPARIN SODIUM (PORCINE) 5000 UNIT/ML IJ SOLN
5000.0000 [IU] | Freq: Three times a day (TID) | INTRAMUSCULAR | Status: DC
  Administered 2014-06-04 (×2): 5000 [IU] via SUBCUTANEOUS
  Filled 2014-06-03 (×4): qty 1

## 2014-06-03 MED ORDER — SODIUM CHLORIDE 0.9 % IV BOLUS (SEPSIS)
500.0000 mL | INTRAVENOUS | Status: AC
  Administered 2014-06-03: 500 mL via INTRAVENOUS

## 2014-06-03 MED ORDER — VANCOMYCIN HCL IN DEXTROSE 1-5 GM/200ML-% IV SOLN
1000.0000 mg | Freq: Once | INTRAVENOUS | Status: AC
  Administered 2014-06-03: 1000 mg via INTRAVENOUS
  Filled 2014-06-03: qty 200

## 2014-06-03 MED ORDER — LORAZEPAM 2 MG/ML IJ SOLN
0.5000 mg | Freq: Once | INTRAMUSCULAR | Status: AC
  Administered 2014-06-03: 0.5 mg via INTRAVENOUS
  Filled 2014-06-03: qty 1

## 2014-06-03 MED ORDER — PIPERACILLIN-TAZOBACTAM 3.375 G IVPB
3.3750 g | Freq: Three times a day (TID) | INTRAVENOUS | Status: DC
  Filled 2014-06-03 (×2): qty 50

## 2014-06-03 MED ORDER — PIPERACILLIN-TAZOBACTAM 3.375 G IVPB 30 MIN
3.3750 g | Freq: Once | INTRAVENOUS | Status: AC
  Administered 2014-06-03: 3.375 g via INTRAVENOUS
  Filled 2014-06-03: qty 50

## 2014-06-03 MED ORDER — THIAMINE HCL 100 MG/ML IJ SOLN
Freq: Once | INTRAVENOUS | Status: AC
  Administered 2014-06-04: 01:00:00 via INTRAVENOUS
  Filled 2014-06-03: qty 1000

## 2014-06-03 MED ORDER — SODIUM CHLORIDE 0.9 % IV SOLN
INTRAVENOUS | Status: AC
  Administered 2014-06-04: via INTRAVENOUS

## 2014-06-03 MED ORDER — LEVETIRACETAM 500 MG PO TABS
500.0000 mg | ORAL_TABLET | Freq: Two times a day (BID) | ORAL | Status: DC
  Administered 2014-06-04 (×2): 500 mg via ORAL
  Filled 2014-06-03 (×4): qty 1

## 2014-06-03 MED ORDER — SODIUM CHLORIDE 0.9 % IJ SOLN
3.0000 mL | Freq: Two times a day (BID) | INTRAMUSCULAR | Status: DC
  Administered 2014-06-04 (×2): 3 mL via INTRAVENOUS

## 2014-06-03 NOTE — ED Notes (Signed)
Attempted to call report but unable to give. Will call back. Unsure of which bed patient will be permently assigned.

## 2014-06-03 NOTE — Progress Notes (Signed)
CSW met with pt at bedside. X-ray technician was present. There was no family present. Per note, pt presents to [facility] due to near syncope. Also, not states that the pt has a history of Alzheimer disease. Patient states that he has been dizzy off and on for about 3 weeks. Also, patient informed CSW that he has high blood pressure.   Patient states that he is able to complete his ADL's independently. Also, he states that he has not fallen within the past 6 months.  Patient appears to be confused about his living situation. Per note, patient has LEVEL 5 CAVEAT dementia.  Patient stated that he lives in an apartment with his brother Haroun. Also, brother states that his brother is his primary support. However, CSW checked the pt's chart which reads that the pt comes from [facility].  Patient did not express any concerns and does not have any questions. Patient is being transported to radiology.   Willette Brace [phone] ED CSW 06/03/2014 8:36 PM

## 2014-06-03 NOTE — ED Notes (Signed)
Attending called to inform that [facility] not [facility] to [facility]. Will wait for further information.

## 2014-06-03 NOTE — ED Notes (Signed)
[facility] to [facility]. Primary nurse is in an inservice and will call back for report.

## 2014-06-03 NOTE — ED Notes (Signed)
Pt's niece Marny LowensteinWendy Patterson to be reached at [phone] requests to be notified if pt is admitted to the hospital or sent home.

## 2014-06-03 NOTE — H&P (Signed)
Date: 06/03/2014               Patient Name:  Joshua Gonzales MRN: [id number]  DOB: 1940-12-05 Age / Sex: 74 y.o., male   PCP: No primary care provider on file.         Medical Service: Internal Medicine Teaching Service         Attending Physician: Dr. Levert Feinstein, MD    First Contact: Dr. Eleonore Chiquito  Pager: [phone]  Second Contact: Dr. Alvina Chou  Pager: [phone]       After Hours (After 5p/  First Contact Pager: [phone]  weekends / holidays): Second Contact Pager: [phone]   Chief Complaint: Near syncope  History of Present Illness: Joshua Gonzales is a 74 year old male with Alzheimer's disease, history of CVA, primary seizure disorder, paroxysmal atrial fibrillation not on anticoagulation, hypertension with presented with near-syncope. Given the patient's dementia, history was collected per chart review and speaking with RN Rosette Reveal at [facility] [[facility]   She reports earlier today, he had fallen in the bathroom and was found to be sweaty and "slobbering at the mouth" though able to respond to questions. At that time, he complained of weakness and headache. He had not hit his head on the ground. He has had no recent changes in appetite and normally eats dinner. He was recently hospitalized earlier this month for observed seizure activity associated with urinary incontinence and was discharged on Keppra 500 mg twice daily. He has had no seizure-like activity since discharge. Otherwise, she denies any recent changes to his medications or prior history of aspiration. She also denies that he reported any shortness of breath, cough, chest pain, abdominal pain, diarrhea, sick contacts though notes that roughly one month ago there was an outbreak of GI illness.  In the emergency department, he was found to have lactate 5.14 and hypotensive with systolic blood pressures trending 80s to 90s initially. He received 2.5 L normal saline bolus and was started on vancomycin and  Zosyn for suspected sepsis. He also received Ativan 0.5 mg 1. Initial troponin negative.    Meds: Current Facility-Administered Medications  Medication Dose Route Frequency Provider Last Rate Last Dose  . [START ON 06/04/2014] piperacillin-tazobactam (ZOSYN) IVPB 3.375 g  3.375 g Intravenous Q8H Christine E Shade, RPH      . [START ON 06/04/2014] vancomycin (VANCOCIN) IVPB 750 mg/150 ml premix  750 mg Intravenous Q12H Christine E Shade, RPH        Allergies: Allergies as of 06/03/2014  . (No Known Allergies)   Past Medical History  Diagnosis Date  . Hypertension   . Seizures   . Alzheimer disease    History reviewed. No pertinent past surgical history. Family History  Problem Relation Age of Onset  . Hyperlipidemia Mother   . Hypertension Mother   . Hypertension Father   . Hyperlipidemia Father    History   Social History  . Marital Status: Single    Spouse Name: N/A  . Number of Children: N/A  . Years of Education: N/A   Occupational History  . Not on file.   Social History Main Topics  . Smoking status: Former Games developer  . Smokeless tobacco: Not on file  . Alcohol Use: Yes  . Drug Use: Not on file  . Sexual Activity: Not on file   Other Topics Concern  . Not on file   Social History Narrative    Review of Systems: As noted in the history of  present illness  Physical Exam: Blood pressure 140/75, pulse 69, temperature 98.6 F (37 C), temperature source Oral, resp. rate 17, height 5\' 9"  (1.753 m), weight 175 lb (79.379 kg), SpO2 92 %.  General: resting in bed, NAD HEENT: PERRL, EOMI, no scleral icterus, oropharynx with dry mucous membranes Cardiac: RRR, no rubs, murmurs or gallops though auscultation limited by gastric sounds Pulm: clear to auscultation bilaterally, no wheezes, rales, or rhonchi Abd: soft, nontender, nondistended, BS present Ext: warm and well perfused, no pedal or tibial edema Neuro: Oriented to name but not place or time, CN II-XII  intact, 5/5 upper and lower extremity strength, 2+ grip strength, Babinski with downgoing toes bilaterally, brisk patellar & brachial reflex 2+, finger to nose notable for impaired coordination most notably when testing in lateral visual fields   Lab results: Basic Metabolic Panel:  Recent Labs  16/12/9601/30/16 1644 06/03/14 1652  NA 142 141  K 4.6 4.4  CL 104 103  CO2 26  --   GLUCOSE 159* 160*  BUN 19 20  CREATININE 1.72* 1.50*  CALCIUM 9.3  --    Liver Function Tests:  Recent Labs  06/03/14 1644  AST 22  ALT 11  ALKPHOS 73  BILITOT 0.7  PROT 7.6  ALBUMIN 3.9    CBC:  Recent Labs  06/03/14 1644 06/03/14 1652  WBC 9.6  --   NEUTROABS 6.1  --   HGB 14.7 16.0  HCT 45.1 47.0  MCV 95.1  --   PLT 174  --    Cardiac Enzymes:  Recent Labs  06/03/14 1654  TROPONINI <0.03   CBG:  Recent Labs  06/03/14 1632  GLUCAP 123*    Alcohol Level:  Recent Labs  06/03/14 1644  ETH <5   Urinalysis:  Recent Labs  06/03/14 1649  COLORURINE YELLOW  LABSPEC 1.021  PHURINE 5.0  GLUCOSEU NEGATIVE  HGBUR NEGATIVE  BILIRUBINUR NEGATIVE  KETONESUR NEGATIVE  PROTEINUR NEGATIVE  UROBILINOGEN 0.2  NITRITE NEGATIVE  LEUKOCYTESUR NEGATIVE    Imaging results:  Dg Chest 2 View  06/03/2014   CLINICAL DATA:  Near syncope.  Alzheimer's disease.  EXAM: CHEST  2 VIEW  COMPARISON:  10/07/2013  FINDINGS: Numerous leads and wires project over the chest. Midline trachea. Mild cardiomegaly. Atherosclerosis in the transverse aorta. No pleural effusion or pneumothorax. Bullous disease in the upper lobes, most apparent anteriorly on the lateral view. Lower lobe predominant interstitial thickening. Left hemidiaphragm elevation is new. Left lower lobe opacity on the frontal view. Not well visualized on the lateral view. Right mid lung opacity is similar and favored to represent scarring.  IMPRESSION: Left base opacity on the frontal radiograph with concurrent new left hemidiaphragm  elevation. This could represent atelectasis or mild infection/aspiration.  Lower lobe predominant interstitial thickening, favored to be related to COPD/ chronic bronchitis.   Electronically Signed   By: Jeronimo GreavesKyle  Talbot M.D.   On: 06/03/2014 18:16    Other results: EKG: Reviewed and compared with 05/15/14 Tracing limited by baseline wander Irregular rhythm Normal axis Q waves in V6 though stable from prior Nonspecific ST elevations in V3 through V5, stable from prior   Assessment & Plan by Problem:  Joshua Gonzales is a 74 year old male with Alzheimer's disease, history of CVA, primary seizure disorder, paroxysmal atrial fibrillation not on anticoagulation, hypertension with near-syncope likely 2/2 dehydration.   Near-syncope. Likely orthostatic in the setting of dehydration though cause unclear. CXR with LLL infiltrate which is suggestive of pneumonia but may also be  consistent with atelectasis given elevated hemidiaphragm. He has had no recent change in appetite or symptoms suggestive of viral illness, and there is no elevation of white blood count or fever which would be consistent with sepsis. Elevated lactate likely reflective of poor oral intake. Neurogenic cause possible for his near-syncope given his history of remote infarcts as noted on most recent head CT and thought to underlie the seizure-like activity that prompted last hospitalization but has had no seizure-like injury since his most recent discharge. No recent head injury either. He is not anticoagulated for his paroxysmal atrial fibrillation given his high fall risk which raises suspicion for CVA though neurologic exam findings are reassuring. Cardiogenic cause for his near-syncope possible given his grade 1 diastolic dysfunction though less likely given stable EKG findings and unremarkable troponin in the ED. he was to have a repeat echo at his most recent hospitalization though was not completed.  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 Likely in the setting of dehydration and possibly hypotension. -IV hydration and BMET as noted above  -Hold lisinopril pending improvement in renal function   Seizure disorder: Continue Keppra 500 mg twice daily.  Alzheimer's disease: Home medications include Aricept 10 mg at bedtime and Paxil 10 mg daily. -Holding medications pending resolution of acute illness  Paroxysmal atrial fibrillation not on anticoagulation: CHADS2VASC score 5 though not on anticoagulation for high fall risk. EKG with irregular rate though controlled. -Telemetry as noted above  Hyperglycemia: Glucose 159 on admission. A1c 6.5, 05/15/2014, suggestive of Type 2 diabetes. Not on any home medication.  -Recheck CBG & start SSI pending diet order  History of CVA: Continue aspirin 325 mg  Hypertension: Home medications include Norvasc 5 mg, lisinopril 5 mg. -Holding the setting pending improvement in his blood pressure  Glaucoma: Continue latanoprost 1 drop in both eyes at bedtime  #FEN:  -Diet: NPO  #DVT prophylaxis: heparin  #CODE STATUS: FULL CODE -Defer to Marny Lowenstein [phone] if patients lacks decision-making capacity -No DNR/DNI paperwork with patient   Dispo: Disposition is deferred at this time, awaiting improvement of current medical problems.   The patient does not know have a current PCP (No primary care provider on file.) and does not need an [facility] hospital  follow-up appointment after discharge.  The patient does have transportation limitations that hinder transportation to clinic appointments.  Signed: Beather Arbour, MD 06/03/2014, 10:03 PM

## 2014-06-03 NOTE — ED Notes (Addendum)
[facility] had near syncopal episode in the shower today. Initial BP 86/40, improved after pt laid down and EMS administered 200 mL NS, BP now 100/60.

## 2014-06-03 NOTE — ED Notes (Signed)
PA made aware of the PTs critical lactic acid result.

## 2014-06-03 NOTE — ED Provider Notes (Signed)
CSN: [id number]     Arrival date & time 06/03/14  1603 History   First MD Initiated Contact with Patient 06/03/14 1626     Chief Complaint  Patient presents with  . Near Syncope     (Consider location/radiation/quality/duration/timing/severity/associated sxs/prior Treatment) HPI Comments: Joshua Gonzales is a 74 y.o. male with a PMHx of Alzheimer's disease, alcoholism, Afib (no anticoaculant due to not being a candidate), seizures, and hypertension, who presents to the ED via EMS from the nursing home for dizziness/near syncope and hypotension. LEVEL 5 CAVEAT: PT HAS DEMENTIA AND PROVIDES NO INFORMATION. EMs reporting that they were called out for near syncope, upon arrival pt was found to have BP of 86/40, given 200mL bolus with improvement to 100/60. Pt denies any CP, SOB, abd pain, n/v, pain anywhere, headaches, numbness, tingling, weakness, or dizziness but difficult to determine if this is accurate given his dementia. No further information provided from nursing facility.   Patient is a 74 y.o. male presenting with near-syncope. The history is provided by the EMS personnel and the patient. The history is limited by the condition of the patient and the absence of a caregiver. No language interpreter was used.  Near Syncope This is a new problem. The current episode started today. The problem occurs intermittently. The problem has been unchanged. Pertinent negatives include no abdominal pain, arthralgias, chest pain, myalgias, nausea or vomiting.    Past Medical History  Diagnosis Date  . Hypertension   . Seizures   . Alzheimer disease    No past surgical history on file. Family History  Problem Relation Age of Onset  . Hyperlipidemia Mother   . Hypertension Mother   . Hypertension Father   . Hyperlipidemia Father    History  Substance Use Topics  . Smoking status: Former Games developermoker  . Smokeless tobacco: Not on file  . Alcohol Use: Yes    Review of Systems  Unable to perform  ROS: Dementia  Respiratory: Negative for shortness of breath.   Cardiovascular: Positive for near-syncope. Negative for chest pain.  Gastrointestinal: Negative for nausea, vomiting and abdominal pain.  Musculoskeletal: Negative for myalgias and arthralgias.  Neurological: Positive for light-headedness (near syncope reported by EMS). Negative for syncope.   10 Systems reviewed and are negative for acute change except as noted in the HPI.    Allergies  Review of patient's allergies indicates no known allergies.  Home Medications   Prior to Admission medications   Medication Sig Start Date End Date Taking? Authorizing Provider  acetaminophen (TYLENOL) 325 MG tablet Take 650 mg by mouth every 6 (six) hours as needed for mild pain.    Historical Provider, MD  amLODipine (NORVASC) 5 MG tablet Take 2 tablets (10 mg total) by mouth daily. 09/10/13   April Palumbo, MD  aspirin 325 MG tablet Take 1 tablet (325 mg total) by mouth daily. 05/16/14   Stark BrayJulia E Mallory, MD  donepezil (ARICEPT) 10 MG tablet Take 10 mg by mouth at bedtime.    Historical Provider, MD  latanoprost (XALATAN) 0.005 % ophthalmic solution Place 1 drop into both eyes at bedtime.    Historical Provider, MD  levETIRAcetam (KEPPRA) 500 MG tablet Take 1 tablet (500 mg total) by mouth 2 (two) times daily. 05/16/14   Stark BrayJulia E Mallory, MD  lisinopril (PRINIVIL,ZESTRIL) 5 MG tablet Take 5 mg by mouth daily.    Historical Provider, MD  PARoxetine (PAXIL) 10 MG tablet Take 10 mg by mouth daily.    Historical Provider,  MD  Vitamin D, Ergocalciferol, (DRISDOL) 50000 UNITS CAPS capsule Take 50,000 Units by mouth every 7 (seven) days.    Historical Provider, MD   BP 90/52 mmHg  Pulse 65  Temp(Src) 98.6 F (37 C) (Oral)  Resp 20  SpO2 97% Physical Exam  Constitutional: Vital signs are normal. He appears well-developed and well-nourished.  Non-toxic appearance. No distress.  Afebrile, nontoxic, NAD  HENT:  Head: Normocephalic and atraumatic.   Mouth/Throat: Oropharynx is clear and moist and mucous membranes are normal.  Eyes: Conjunctivae and EOM are normal. Pupils are equal, round, and reactive to light. Right eye exhibits no discharge. Left eye exhibits no discharge.  PERRL, EOMI without nystagmus  Neck: Normal range of motion. Neck supple.  Cardiovascular: Normal rate, regular rhythm, normal heart sounds and intact distal pulses.  Exam reveals no gallop and no friction rub.   No murmur heard. RRR, nl s1/s2, no m/r/g, distal pulses intact, no pedal edema   Pulmonary/Chest: Effort normal and breath sounds normal. No respiratory distress. He has no decreased breath sounds. He has no wheezes. He has no rhonchi. He has no rales.  CTAB in all lung fields, no w/r/r, no hypoxia or increased WOB, speaking in full sentences, SpO2 97% on RA   Abdominal: Soft. Normal appearance and bowel sounds are normal. He exhibits no distension. There is no tenderness. There is no rigidity, no rebound, no guarding, no tenderness at McBurney's point and negative Murphy's sign.  Musculoskeletal: Normal range of motion.  MAE x4 Strength and sensation grossly intact Distal pulses intact No pedal edema  Neurological: He is alert. He has normal strength. No sensory deficit. GCS eye subscore is 4. GCS verbal subscore is 4. GCS motor subscore is 6.  Alert, oriented to self but not place or time GCS 14 CN 2-12 grossly intact although pt doesn't follow all commands during initial exam, distracted by having blood drawn Sensation and strength grossly intact Unable to assess gait initially due to pt being worked on by nursing  Skin: Skin is warm, dry and intact. No rash noted.  Psychiatric: He has a normal mood and affect.  Nursing note and vitals reviewed.   ED Course  Procedures (including critical care time) Labs Review Labs Reviewed  URINALYSIS, ROUTINE W REFLEX MICROSCOPIC - Abnormal; Notable for the following:    APPearance CLOUDY (*)    All other  components within normal limits  COMPREHENSIVE METABOLIC PANEL - Abnormal; Notable for the following:    Glucose, Bld 159 (*)    Creatinine, Ser 1.72 (*)    GFR calc non Af Amer 38 (*)    GFR calc Af Amer 44 (*)    All other components within normal limits  CBG MONITORING, ED - Abnormal; Notable for the following:    Glucose-Capillary 123 (*)    All other components within normal limits  I-STAT CHEM 8, ED - Abnormal; Notable for the following:    Creatinine, Ser 1.50 (*)    Glucose, Bld 160 (*)    All other components within normal limits  I-STAT CG4 LACTIC ACID, ED - Abnormal; Notable for the following:    Lactic Acid, Venous 5.14 (*)    All other components within normal limits  I-STAT TROPOININ, ED - Abnormal; Notable for the following:    Troponin i, poc <0.20 (*)    All other components within normal limits  CULTURE, BLOOD (ROUTINE X 2)  CULTURE, BLOOD (ROUTINE X 2)  URINE CULTURE  ETHANOL  CBC  WITH DIFFERENTIAL/PLATELET  TROPONIN I  LACTIC ACID, PLASMA    Imaging Review Dg Chest 2 View  06/03/2014   CLINICAL DATA:  Near syncope.  Alzheimer's disease.  EXAM: CHEST  2 VIEW  COMPARISON:  10/07/2013  FINDINGS: Numerous leads and wires project over the chest. Midline trachea. Mild cardiomegaly. Atherosclerosis in the transverse aorta. No pleural effusion or pneumothorax. Bullous disease in the upper lobes, most apparent anteriorly on the lateral view. Lower lobe predominant interstitial thickening. Left hemidiaphragm elevation is new. Left lower lobe opacity on the frontal view. Not well visualized on the lateral view. Right mid lung opacity is similar and favored to represent scarring.  IMPRESSION: Left base opacity on the frontal radiograph with concurrent new left hemidiaphragm elevation. This could represent atelectasis or mild infection/aspiration.  Lower lobe predominant interstitial thickening, favored to be related to COPD/ chronic bronchitis.   Electronically Signed   By:  Jeronimo Greaves M.D.   On: 06/03/2014 18:16     EKG Interpretation   Date/Time:  Wednesday June 03 2014 16:21:12 EDT Ventricular Rate:  56 PR Interval:  219 QRS Duration: 93 QT Interval:  397 QTC Calculation: 383 R Axis:   98 Text Interpretation:  Unknown rhythm, irregular rate Anterior infarct, old  Minimal ST elevation, lateral leads nonspecific ST changes appear new  compared to prior Confirmed by [facility]  MD, TREY ([id number]) on 06/03/2014  4:25:13 PM      MDM   Final diagnoses:  Hypotension  Near syncope  Lactic acid increased    73 y.o. male here with near syncope per EMS. Patient providing no information due to dementia, answers questions but not oriented to place or time, states he doesn't feel pain and denies feeling lightheaded. No further information given from EMS aside from his initial BP being 86/40, NS given and BP increased to 100/60. EKG with minimal ST changes in lateral leads V4-5, irregular rate. Will obtain labs, trop, CXR, ethanol, and U/A. Will give fluids and reassess shortly.   4:48 PM Lactic acid elevated at 5.14. Will get blood cultures, start antibiotics and call code sepsis. Unclear etiology at this time.   7:12 PM Trop I WNL, istat trop initially elevated which was a lab error. U/A without signs of infection. CBC WNL, CMP with elevated Cr at 1.72 indicating possible dehydration. CXR showing possible atelectasis vs aspiration/early PNA. Lung sounds clear but pt with poor inspiratory effort. BP improving now with fluids, but will admit for lactic acid elevation and dehydration with possible PNA as source.  7:26 PM Dr. Toniann Fail returning page, requests repeat lactic check but will admit to stepdown. Please see his note for further documentation of care.   BP 136/73 mmHg  Pulse 70  Temp(Src) 98.6 F (37 C) (Oral)  Resp 38  Ht 5\' 9"  (1.753 m)  Wt 175 lb (79.379 kg)  BMI 25.83 kg/m2  SpO2 93%  Meds ordered this encounter  Medications  .  sodium chloride 0.9 % bolus 1,000 mL    Sig:   . piperacillin-tazobactam (ZOSYN) IVPB 3.375 g    Sig:     Order Specific Question:  Antibiotic Indication:    Answer:  Sepsis  . vancomycin (VANCOCIN) IVPB 1000 mg/200 mL premix    Sig:     Order Specific Question:  Indication:    Answer:  Sepsis  . sodium chloride 0.9 % bolus 1,000 mL    Sig:     Order Specific Question:  Weight basis for 30 ml/kg  NS bolus    Answer:  79   Followed by  . sodium chloride 0.9 % bolus 500 mL    Sig:     Order Specific Question:  Weight basis for 30 ml/kg NS bolus    Answer:  79  . vancomycin (VANCOCIN) IVPB 750 mg/150 ml premix    Sig:     Order Specific Question:  Indication:    Answer:  Sepsis  . piperacillin-tazobactam (ZOSYN) IVPB 3.375 g    Sig:     Order Specific Question:  Antibiotic Indication:    Answer:  Sepsis     Joanie Duprey Camprubi-Soms, PA-C 06/03/14 1926  8:02 PM Dr. Toniann Fail returning call, appears this pt is a bounce back for internal med residency, therefore called them and Dr. Sherrine Maples returning page, will have transferred to [facility] for admission there. She will accept transfer, Dr. Cyndie Chime attending, please see their note for further documentation of care.   [address] [place], PA-C 06/03/14 2005  Blake Divine, MD 06/04/14 [phone]

## 2014-06-03 NOTE — ED Notes (Signed)
[facility] at bedside to [facility]. Pt very agitated stating he's not going anywhere. PA aware.

## 2014-06-03 NOTE — ED Notes (Signed)
Provided patient hygiene, changed sheets, gown, and incont pads. Incont pads were soaked.

## 2014-06-03 NOTE — ED Notes (Signed)
Bed: WA25 Expected date:  Expected time:  Means of arrival:  Comments: EMS  

## 2014-06-03 NOTE — ED Notes (Signed)
Gave report to Boneta LucksJenny, RN with critical care transport.

## 2014-06-03 NOTE — Progress Notes (Signed)
ANTIBIOTIC CONSULT NOTE - INITIAL  Pharmacy Consult for Vancomycin, Zosyn Indication: rule out sepsis  No Known Allergies  Patient Measurements:   Last weight 79.4 kg (05/15/14)  Vital Signs: Temp: 98.6 F (37 C) (03/30 1622) Temp Source: Oral (03/30 1622) BP: 122/71 mmHg (03/30 1715) Pulse Rate: 65 (03/30 1622) Intake/Output from previous day:   Intake/Output from this shift:    Labs:  Recent Labs  06/03/14 1644 06/03/14 1652  WBC 9.6  --   HGB 14.7 16.0  PLT 174  --   CREATININE  --  1.50*   CrCl cannot be calculated (Unknown ideal weight.). No results for input(s): VANCOTROUGH, VANCOPEAK, VANCORANDOM, GENTTROUGH, GENTPEAK, GENTRANDOM, TOBRATROUGH, TOBRAPEAK, TOBRARND, AMIKACINPEAK, AMIKACINTROU, AMIKACIN in the last 72 hours.   Microbiology: No results found for this or any previous visit (from the past 720 hour(s)).  Medical History: Past Medical History  Diagnosis Date  . Hypertension   . Seizures   . Alzheimer disease      Assessment: [age] yoM admitted 3/30 from [facility] with near syncopal episode, hypotension, and elevated lactic acid. Pharmacy is consulted to dose vancomycin and Zosyn for suspected sepsis.  3/30 >> Vanc >> 3/30 >> Zosyn >>   Today, 06/03/2014:  Tmax: afebrile  WBCs: 9.6  Renal: SCr 1.5, CrCl ~ 50 ml/min  Lactic acid: 5.14   Goal of Therapy:  Vancomycin trough level 15-20 mcg/ml  Plan:   Zosyn 3.375g IV Q8H infused over 4hrs.  Vancomycin 1g IV x1 then 750 mg q12h.  Measure Vanc trough at steady state.  Follow up renal fxn, culture results, and clinical course.  Lynann Beaverhristine Cameron Schwinn PharmD, BCPS Pager [phone] 06/03/2014 5:26 PM

## 2014-06-04 ENCOUNTER — Observation Stay (HOSPITAL_COMMUNITY): Payer: Medicare Other

## 2014-06-04 DIAGNOSIS — R739 Hyperglycemia, unspecified: Secondary | ICD-10-CM

## 2014-06-04 DIAGNOSIS — N182 Chronic kidney disease, stage 2 (mild): Secondary | ICD-10-CM

## 2014-06-04 DIAGNOSIS — F1021 Alcohol dependence, in remission: Secondary | ICD-10-CM

## 2014-06-04 DIAGNOSIS — R55 Syncope and collapse: Secondary | ICD-10-CM | POA: Diagnosis not present

## 2014-06-04 DIAGNOSIS — E872 Acidosis, unspecified: Secondary | ICD-10-CM | POA: Insufficient documentation

## 2014-06-04 DIAGNOSIS — H409 Unspecified glaucoma: Secondary | ICD-10-CM

## 2014-06-04 DIAGNOSIS — I252 Old myocardial infarction: Secondary | ICD-10-CM

## 2014-06-04 DIAGNOSIS — Z87891 Personal history of nicotine dependence: Secondary | ICD-10-CM

## 2014-06-04 DIAGNOSIS — E860 Dehydration: Secondary | ICD-10-CM

## 2014-06-04 DIAGNOSIS — G40909 Epilepsy, unspecified, not intractable, without status epilepticus: Secondary | ICD-10-CM

## 2014-06-04 DIAGNOSIS — N179 Acute kidney failure, unspecified: Secondary | ICD-10-CM

## 2014-06-04 DIAGNOSIS — Z7982 Long term (current) use of aspirin: Secondary | ICD-10-CM

## 2014-06-04 DIAGNOSIS — Z79899 Other long term (current) drug therapy: Secondary | ICD-10-CM

## 2014-06-04 DIAGNOSIS — I69393 Ataxia following cerebral infarction: Secondary | ICD-10-CM

## 2014-06-04 DIAGNOSIS — Z8679 Personal history of other diseases of the circulatory system: Secondary | ICD-10-CM | POA: Diagnosis not present

## 2014-06-04 DIAGNOSIS — F10150 Alcohol abuse with alcohol-induced psychotic disorder with delusions: Secondary | ICD-10-CM | POA: Diagnosis not present

## 2014-06-04 DIAGNOSIS — I129 Hypertensive chronic kidney disease with stage 1 through stage 4 chronic kidney disease, or unspecified chronic kidney disease: Secondary | ICD-10-CM

## 2014-06-04 LAB — CBC
HEMATOCRIT: 39.2 % (ref 39.0–52.0)
Hemoglobin: 13.4 g/dL (ref 13.0–17.0)
MCH: 31.5 pg (ref 26.0–34.0)
MCHC: 34.2 g/dL (ref 30.0–36.0)
MCV: 92 fL (ref 78.0–100.0)
Platelets: 191 10*3/uL (ref 150–400)
RBC: 4.26 MIL/uL (ref 4.22–5.81)
RDW: 13.7 % (ref 11.5–15.5)
WBC: 8.7 10*3/uL (ref 4.0–10.5)

## 2014-06-04 LAB — BASIC METABOLIC PANEL
ANION GAP: 9 (ref 5–15)
BUN: 8 mg/dL (ref 6–23)
CALCIUM: 9 mg/dL (ref 8.4–10.5)
CO2: 24 mmol/L (ref 19–32)
CREATININE: 1.04 mg/dL (ref 0.50–1.35)
Chloride: 108 mmol/L (ref 96–112)
GFR, EST AFRICAN AMERICAN: 81 mL/min — AB (ref >90)
GFR, EST NON AFRICAN AMERICAN: 70 mL/min — AB (ref >90)
Glucose, Bld: 98 mg/dL (ref 70–99)
Potassium: 3.7 mmol/L (ref 3.5–5.1)
SODIUM: 141 mmol/L (ref 135–145)

## 2014-06-04 LAB — CK: CK TOTAL: 124 U/L (ref 7–232)

## 2014-06-04 LAB — TROPONIN I: Troponin I: <0.03 ng/mL (ref <0.031)

## 2014-06-04 LAB — GLUCOSE, CAPILLARY
GLUCOSE-CAPILLARY: 75 mg/dL (ref 70–99)
Glucose-Capillary: 76 mg/dL (ref 70–99)

## 2014-06-04 LAB — MRSA PCR SCREENING: MRSA by PCR: POSITIVE — AB

## 2014-06-04 MED ORDER — MUPIROCIN 2 % EX OINT
1.0000 "application " | TOPICAL_OINTMENT | Freq: Two times a day (BID) | CUTANEOUS | Status: DC
  Administered 2014-06-04 (×2): 1 via NASAL
  Filled 2014-06-04: qty 22

## 2014-06-04 MED ORDER — CHLORHEXIDINE GLUCONATE CLOTH 2 % EX PADS
6.0000 | MEDICATED_PAD | Freq: Every day | CUTANEOUS | Status: DC
  Administered 2014-06-04: 6 via TOPICAL

## 2014-06-04 MED ORDER — LEVETIRACETAM 750 MG PO TABS: 750.0000 mg | ORAL_TABLET | Freq: Two times a day (BID) | ORAL | Status: DC

## 2014-06-04 MED ORDER — LEVETIRACETAM 750 MG PO TABS
750.0000 mg | ORAL_TABLET | Freq: Two times a day (BID) | ORAL | Status: DC
  Filled 2014-06-04: qty 1

## 2014-06-04 NOTE — Progress Notes (Addendum)
Marny LowensteinWendy Patterson, pt's niece, is made aware of this discharge.  [facility] and also informed her that I will send the prescription for Keppra to the ambulance crew.

## 2014-06-04 NOTE — Clinical Social Work Note (Signed)
[facility] [phone])  Crystal confirmed that [facility] and sees no reason why pt can not return when able to DC- would need clinicals faxed to [phone] at time of DC  CSW will continue to follow.  Merlyn LotJenna Holoman, LCSWA Clinical Social Worker [phone]

## 2014-06-04 NOTE — Progress Notes (Signed)
[facility] is here to pick up the patient.

## 2014-06-04 NOTE — Discharge Summary (Signed)
Name: Joshua PartridgeJohn H Gonzales MRN: [id number] DOB: Dec 24, 1940 74 y.o. PCP: No primary care provider on file.  Date of Admission: 06/03/2014  4:03 PM Date of Discharge: 06/04/2014 Attending Physician: Levert FeinsteinJames M Granfortuna, MD  Discharge Diagnosis: Active Problems:   Essential hypertension, benign   History of seizures   Dementia with behavioral disturbance   PAF (paroxysmal atrial fibrillation)   Hypotension   Near syncope   AKI (acute kidney injury)   Lactic acid increased  Discharge Medications:   Medication List    TAKE these medications        acetaminophen 325 MG tablet  Commonly known as:  TYLENOL  Take 650 mg by mouth every 6 (six) hours as needed for mild pain.     amLODipine 5 MG tablet  Commonly known as:  NORVASC  Take 2 tablets (10 mg total) by mouth daily.     aspirin 325 MG tablet  Take 1 tablet (325 mg total) by mouth daily.     donepezil 10 MG tablet  Commonly known as:  ARICEPT  Take 10 mg by mouth at bedtime.     guaiFENesin 100 MG/5ML liquid  Commonly known as:  ROBITUSSIN  Take 200 mg by mouth every 6 (six) hours as needed for cough.     latanoprost 0.005 % ophthalmic solution  Commonly known as:  XALATAN  Place 1 drop into both eyes at bedtime.     levETIRAcetam 750 MG tablet  Commonly known as:  KEPPRA  Take 1 tablet (750 mg total) by mouth 2 (two) times daily.     lisinopril 5 MG tablet  Commonly known as:  PRINIVIL,ZESTRIL  Take 5 mg by mouth daily.     loperamide 2 MG capsule  Commonly known as:  IMODIUM  Take 2 mg by mouth as needed for diarrhea or loose stools.     magnesium hydroxide 400 MG/5ML suspension  Commonly known as:  MILK OF MAGNESIA  Take 30 mLs by mouth at bedtime as needed for mild constipation.     PARoxetine 10 MG tablet  Commonly known as:  PAXIL  Take 10 mg by mouth daily.     TRIPLE ANTIBIOTIC EX  Apply 1 application topically daily as needed (skin tears).     Vitamin D (Ergocalciferol) 50000 UNITS Caps capsule   Commonly known as:  DRISDOL  Take 50,000 Units by mouth every 7 (seven) days.        Disposition and follow-up:   [facility].  At the hospital follow up visit please address:  1.  Likely Seizure: Patient had a seizure (observed) prior to last admission and was started on Keppra 500 mg BID. He appears to have had a breakthrough seizure prior to admission yesterday. His keppra dose was increased to 750 mg BID. Patient was also dehydrated with AKI on admission and was rehydrated (and had a normalization of kidney function).  2.  Labs / imaging needed at time of follow-up: BMET  3.  Pending labs/ test needing follow-up: none  [facility]  Discharge Instructions:  Please increase your keppra dose to 750 mg twice per day.  Keep up your food and drink at home. You were dehydrated on this admission.  Consultations: none    Procedures Performed:  Dg Chest 2 View  06/04/2014   CLINICAL DATA:  Subsequent evaluation for near syncope, a Alzheimer's disease  EXAM: CHEST  2 VIEW  COMPARISON:  06/03/2014  FINDINGS: Increased linear opacities at both lung bases, similar to prior study. Conspicuity is increased bilaterally likely at least partially due to technique.  Mild cardiac enlargement stable. Uncoiling and calcification of the aorta stable. Stable elevation of the left diaphragm. No pleural effusion.  IMPRESSION: Bibasilar opacities similar to prior study, with increased conspicuity likely due at least in part to technique. Bibasilar scarring again suspected.   Electronically Signed   By: Esperanza Heir M.D.   On: 06/04/2014 08:31   Dg Chest 2 View  06/03/2014   CLINICAL DATA:  Near syncope.  Alzheimer's disease.  EXAM: CHEST  2 VIEW  COMPARISON:  10/07/2013  FINDINGS: Numerous leads and wires project over the chest. Midline trachea. Mild cardiomegaly. Atherosclerosis in the transverse aorta. No  pleural effusion or pneumothorax. Bullous disease in the upper lobes, most apparent anteriorly on the lateral view. Lower lobe predominant interstitial thickening. Left hemidiaphragm elevation is new. Left lower lobe opacity on the frontal view. Not well visualized on the lateral view. Right mid lung opacity is similar and favored to represent scarring.  IMPRESSION: Left base opacity on the frontal radiograph with concurrent new left hemidiaphragm elevation. This could represent atelectasis or mild infection/aspiration.  Lower lobe predominant interstitial thickening, favored to be related to COPD/ chronic bronchitis.   Electronically Signed   By: Jeronimo Greaves M.D.   On: 06/03/2014 18:16   Ct Head Wo Contrast  05/15/2014   CLINICAL DATA:  Sudden arm stenting and gaze to the left side, possible stroke. Possible seizure. Fecal incontinence. History of dementia. Initially hypotensive and bradycardic.  EXAM: CT HEAD WITHOUT CONTRAST  TECHNIQUE: Contiguous axial images were obtained from the base of the skull through the vertex without intravenous contrast.  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 Scattered remote lacunar infarcts involving the thalami, right lentiform nucleus, anterior limb left internal capsule, and bilateral caudate heads. Remote infarct, right parietal lobe. Periventricular white matter and corona radiata hypodensities favor chronic ischemic microvascular white matter disease.  No intracranial hemorrhage, mass lesion, or acute CVA. There is atherosclerotic calcification of the cavernous carotid arteries bilaterally.  IMPRESSION: 1. Multiple remote infarcts, chronic microvascular white matter disease, and remote lacunar infarcts; no acute intracranial findings are identified.   Electronically Signed   By: Gaylyn Rong M.D.   On: 05/15/2014 13:09    2D Echo: Study Conclusions  - Left ventricle: The cavity size was normal.  Wall thickness was normal. Systolic function was normal. The estimated ejection fraction was in the range of 55% to 60%. Possible mild hypokinesis of the apicalanterior and lateral myocardium. Doppler parameters are consistent with abnormal left ventricular relaxation (grade 1 diastolic dysfunction). No evidence of thrombus. - Right ventricle: The cavity size was mildly dilated. Wall thickness was normal. - Atrial septum: No defect or patent foramen ovale was identified. - Tricuspid valve: There was moderate-severe regurgitation directed centrally. - Pulmonary arteries: Systolic pressure was moderately increased. PA peak pressure: 54 mm Hg (S).  Impressions:  - Consider (acute on) chronic cor pulmonale. Evaluation for pulmonary embolism may be appropriate.  Admission HPI: Joshua Gonzales is a 74 year old male with Alzheimer's disease, history of CVA, primary seizure disorder, paroxysmal atrial fibrillation not on anticoagulation, hypertension with presented with near-syncope. Given the patient's dementia, history was collected per chart review and speaking with RN Rosette Reveal at [facility]   She reports earlier today, he had fallen in the bathroom and was found  to be sweaty and "slobbering at the mouth" though able to respond to questions. At that time, he complained of weakness and headache. He had not hit his head on the ground. He has had no recent changes in appetite and normally eats dinner. He was recently hospitalized earlier this month for observed seizure activity associated with urinary incontinence and was discharged on Keppra 500 mg twice daily. He has had no seizure-like activity since discharge. Otherwise, she denies any recent changes to his medications or prior history of aspiration. She also denies that he reported any shortness of breath, cough, chest pain, abdominal pain, diarrhea, sick contacts though notes that roughly one month ago there  was an outbreak of GI illness.  In the emergency department, he was found to have lactate 5.14 and hypotensive with systolic blood pressures trending 80s to 90s initially. He received 2.5 L normal saline bolus and was started on vancomycin and Zosyn for suspected sepsis. He also received Ativan 0.5 mg 1. Initial troponin negative.  Hospital Course by problem list: Principal Problem:   Near syncope Active Problems:   Essential hypertension, benign   History of seizures   Dementia with behavioral disturbance   PAF (paroxysmal atrial fibrillation)   Hypotension   AKI (acute kidney injury)   Lactic acid increased   Joshua Gonzales is a 74 yo man with a history of CVA, seizure disorder, dementia and multiple syncopal events without identified cardiac or neurologic cause who represented with a syncopal episode. He had recently been started on keppra 500 mg BID at his last hospitalization, after his first witnessed seizure event. Prior to this admission, staff at his SNF found him foaming at the mouth and with increased confusion, convincing for a breakthrough seizure. He will be put on a higher dose of keppra and will return to his SNF today.  Likely Recurrent Seizure: Patient has a history of seizures and was found to have fallen in the bathroom at his SNF with foaming at the mouth suggestive of seizure. Had elevated lactate on admission, which is also suggestive. He is back to his baseline with no focal neurologic deficits. Echo was completed and revealed EF 55-60% with grade 1 diastolic dysfunction (unchanged from prior in 2014); also with some mild hypokinesis of the apical anterior and lateral myocardium. CXR with bibasilar opacities unchanged from prior study, no infiltrate or other signs of infection. Increased home keppra from 500 mg BID to 750 mg BID given likely seizure activity.  Dehydration and Resolved AKI: Creatinine was elevated to 1.72 on admission, but fell to 1.04 with rehydration.  Baseline 0.9-1.2.   Dementia: Multiple risk factors for dementia; signs of multiple prior infarcts on CT during last admission. Also has a history of alcohol abuse. Positive RPR in past with VDRL on 11/27/12 and CSF evaluated over concern for neurosyphilis. Per Dr. Ninetta Lights, CSF at that time not consistent with neurosyphilis. On aricept 10 mg at bedtime and paxil 10 mg daily.  Paroxysmal atrial fibrillation not on anticoagulation: CHADS2VASC score 5 though not on anticoagulation for high fall risk. EKG with irregular rate though controlled.  History of CVA: Multiple prior infarcts seen on CT during last admission. Continued aspirin 325 mg.  Hypertension: Home medications include Norvasc 5 mg, lisinopril 5 mg.  Glaucoma: Continued latanoprost 1 drop in both eyes at bedtime.  Discharge Vitals:   BP 113/71 mmHg  Pulse 68  Temp(Src) 98.5 F (36.9 C) (Oral)  Resp 20  Ht  (1.753 m)  Wt  150 lb 9.2 oz (68.3 kg)  BMI 22.23 kg/m2  SpO2 95%  Discharge Labs:  Results for orders placed or performed during the hospital encounter of 06/03/14 (from the past 24 hour(s))  CBG, ED     Status: Abnormal   Collection Time: 06/03/14  4:32 PM  Result Value Ref Range   Glucose-Capillary 123 (H) 70 - 99 mg/dL  Ethanol     Status: None   Collection Time: 06/03/14  4:44 PM  Result Value Ref Range   Alcohol, Ethyl (B) <5 0 - 9 mg/dL  CBC with Differential     Status: None   Collection Time: 06/03/14  4:44 PM  Result Value Ref Range   WBC 9.6 4.0 - 10.5 K/uL   RBC 4.74 4.22 - 5.81 MIL/uL   Hemoglobin 14.7 13.0 - 17.0 g/dL   HCT 91.4 78.2 - 95.6 %   MCV 95.1 78.0 - 100.0 fL   MCH 31.0 26.0 - 34.0 pg   MCHC 32.6 30.0 - 36.0 g/dL   RDW 21.3 08.6 - 57.8 %   Platelets 174 150 - 400 K/uL   Neutrophils Relative % 64 43 - 77 %   Neutro Abs 6.1 1.7 - 7.7 K/uL   Lymphocytes Relative 28 12 - 46 %   Lymphs Abs 2.7 0.7 - 4.0 K/uL   Monocytes Relative 8 3 - 12 %   Monocytes Absolute 0.8 0.1 - 1.0 K/uL    Eosinophils Relative 0 0 - 5 %   Eosinophils Absolute 0.0 0.0 - 0.7 K/uL   Basophils Relative 0 0 - 1 %   Basophils Absolute 0.0 0.0 - 0.1 K/uL  Comprehensive metabolic panel     Status: Abnormal   Collection Time: 06/03/14  4:44 PM  Result Value Ref Range   Sodium 142 135 - 145 mmol/L   Potassium 4.6 3.5 - 5.1 mmol/L   Chloride 104 96 - 112 mmol/L   CO2 26 19 - 32 mmol/L   Glucose, Bld 159 (H) 70 - 99 mg/dL   BUN 19 6 - 23 mg/dL   Creatinine, Ser 4.69 (H) 0.50 - 1.35 mg/dL   Calcium 9.3 8.4 - 62.9 mg/dL   Total Protein 7.6 6.0 - 8.3 g/dL   Albumin 3.9 3.5 - 5.2 g/dL   AST 22 0 - 37 U/L   ALT 11 0 - 53 U/L   Alkaline Phosphatase 73 39 - 117 U/L   Total Bilirubin 0.7 0.3 - 1.2 mg/dL   GFR calc non Af Amer 38 (L) >90 mL/min   GFR calc Af Amer 44 (L) >90 mL/min   Anion gap 12 5 - 15  I-Stat CG4 Lactic Acid, ED     Status: Abnormal   Collection Time: 06/03/14  4:45 PM  Result Value Ref Range   Lactic Acid, Venous 5.14 (HH) 0.5 - 2.0 mmol/L   Comment NOTIFIED PHYSICIAN   I-stat troponin, ED     Status: Abnormal   Collection Time: 06/03/14  4:48 PM  Result Value Ref Range   Troponin i, poc <0.20 (HH) 0.00 - 0.08 ng/mL   Comment 3          Urinalysis, Routine w reflex microscopic     Status: Abnormal   Collection Time: 06/03/14  4:49 PM  Result Value Ref Range   Color, Urine YELLOW YELLOW   APPearance CLOUDY (A) CLEAR   Specific Gravity, Urine 1.021 1.005 - 1.030   pH 5.0 5.0 - 8.0   Glucose, UA NEGATIVE  NEGATIVE mg/dL   Hgb urine dipstick NEGATIVE NEGATIVE   Bilirubin Urine NEGATIVE NEGATIVE   Ketones, ur NEGATIVE NEGATIVE mg/dL   Protein, ur NEGATIVE NEGATIVE mg/dL   Urobilinogen, UA 0.2 0.0 - 1.0 mg/dL   Nitrite NEGATIVE NEGATIVE   Leukocytes, UA NEGATIVE NEGATIVE  I-Stat Chem 8, ED     Status: Abnormal   Collection Time: 06/03/14  4:52 PM  Result Value Ref Range   Sodium 141 135 - 145 mmol/L   Potassium 4.4 3.5 - 5.1 mmol/L   Chloride 103 96 - 112 mmol/L   BUN 20 6  - 23 mg/dL   Creatinine, Ser 4.09 (H) 0.50 - 1.35 mg/dL   Glucose, Bld 811 (H) 70 - 99 mg/dL   Calcium, Ion 9.14 7.82 - 1.30 mmol/L   TCO2 22 0 - 100 mmol/L   Hemoglobin 16.0 13.0 - 17.0 g/dL   HCT 95.6 21.3 - 08.6 %  Troponin I     Status: None   Collection Time: 06/03/14  4:54 PM  Result Value Ref Range   Troponin I <0.03 <0.031 ng/mL  Lactic acid, plasma     Status: None   Collection Time: 06/03/14  7:39 PM  Result Value Ref Range   Lactic Acid, Venous 1.7 0.5 - 2.0 mmol/L  MRSA PCR Screening     Status: Abnormal   Collection Time: 06/03/14 10:04 PM  Result Value Ref Range   MRSA by PCR POSITIVE (A) NEGATIVE  Troponin I     Status: None   Collection Time: 06/04/14  2:38 AM  Result Value Ref Range   Troponin I <0.03 <0.031 ng/mL  Basic metabolic panel     Status: Abnormal   Collection Time: 06/04/14  3:28 AM  Result Value Ref Range   Sodium 141 135 - 145 mmol/L   Potassium 3.7 3.5 - 5.1 mmol/L   Chloride 108 96 - 112 mmol/L   CO2 24 19 - 32 mmol/L   Glucose, Bld 98 70 - 99 mg/dL   BUN 8 6 - 23 mg/dL   Creatinine, Ser 5.78 0.50 - 1.35 mg/dL   Calcium 9.0 8.4 - 46.9 mg/dL   GFR calc non Af Amer 70 (L) >90 mL/min   GFR calc Af Amer 81 (L) >90 mL/min   Anion gap 9 5 - 15  CBC     Status: None   Collection Time: 06/04/14  3:28 AM  Result Value Ref Range   WBC 8.7 4.0 - 10.5 K/uL   RBC 4.26 4.22 - 5.81 MIL/uL   Hemoglobin 13.4 13.0 - 17.0 g/dL   HCT 62.9 52.8 - 41.3 %   MCV 92.0 78.0 - 100.0 fL   MCH 31.5 26.0 - 34.0 pg   MCHC 34.2 30.0 - 36.0 g/dL   RDW 24.4 01.0 - 27.2 %   Platelets 191 150 - 400 K/uL  CK     Status: None   Collection Time: 06/04/14  3:49 AM  Result Value Ref Range   Total CK 124 7 - 232 U/L  Glucose, capillary     Status: None   Collection Time: 06/04/14  6:12 AM  Result Value Ref Range   Glucose-Capillary 75 70 - 99 mg/dL  Glucose, capillary     Status: None   Collection Time: 06/04/14  7:38 AM  Result Value Ref Range   Glucose-Capillary 76  70 - 99 mg/dL    Signed: Stark Bray, MD 06/04/2014, 2:41 PM    Services Ordered on Discharge:  SNF Equipment Ordered on Discharge: none

## 2014-06-04 NOTE — Evaluation (Signed)
Clinical/Bedside Swallow Evaluation Patient Details  Name: Joshua Gonzales MRN: [id number] Date of Birth: [birth year]  Today's Date: 06/04/2014 Time: SLP Start Time (ACUTE ONLY): 1125 SLP Stop Time (ACUTE ONLY): 1135 SLP Time Calculation (min) (ACUTE ONLY): 10 min  Past Medical History:  Past Medical History  Diagnosis Date  . Hypertension   . Seizures   . Alzheimer disease    Past Surgical History: History reviewed. No pertinent past surgical history. HPI:  Pt is a 74 y/o male with Alzheimer's Diesease, PMH of seizure disorder, paroxysmal atrial fibralation not on anticoagulation, HTN who presented with near-syncopal episode. [facility] reported pt had fall (06/03/14). CXR revealed left base opacity with concurrent new left hemidiaphragm elevation which could represent atelectasis or mild infection/aspiration. Lower lobe predominant interstitial thickening, favored to be related to COPD/chronic bronchitis. Pt seen by speech (11/2012); placed on reg/thin liquid diet.    Assessment / Plan / Recommendation Clinical Impression   Pt was seen for a swallow evaluation. PO trials with thin liquids elicited immediate cough x1 with large straw sip. No s/s of aspiration observed when reduced to small sips of thin liquids and across all other consistencies. Recommend pt continue reg/ thin liquid diet. Speech will sign off.     Aspiration Risk  Mild    Diet Recommendation Regular;Thin liquid   Liquid Administration via: Cup;Straw Medication Administration: Whole meds with liquid Supervision: Intermittent supervision to cue for compensatory strategies Compensations: Small sips/bites;Slow rate Postural Changes and/or Swallow Maneuvers: Seated upright 90 degrees    Other  Recommendations Oral Care Recommendations: Oral care BID   Follow Up Recommendations  None    Frequency and Duration        Pertinent Vitals/Pain     SLP Swallow Goals     Swallow Study Prior  Functional Status       General HPI: Pt is a 74 y/o male with Alzheimer's Diesease, PMH of seizure disorder, paroxysmal atrial fibralation not on anticoagulation, HTN who presented with near-syncopal episode. [facility] reported pt had fall (06/03/14). CXR revealed left base opacity with concurrent new left hemidiaphragm elevation which could represent atelectasis or mild infection/aspiration. Lower lobe predominant interstitial thickening, favored to be related to COPD/chronic bronchitis. Pt seen by speech (11/2012); placed on reg/thin liquid diet.  Type of Study: Bedside swallow evaluation Diet Prior to this Study: Regular;Thin liquids Temperature Spikes Noted: No Respiratory Status: Room air History of Recent Intubation: No Behavior/Cognition: Alert;Cooperative Oral Cavity - Dentition: Missing dentition Self-Feeding Abilities: Needs assist Patient Positioning: Upright in bed Baseline Vocal Quality: Clear    Oral/Motor/Sensory Function Overall Oral Motor/Sensory Function: Appears within functional limits for tasks assessed Labial ROM: Within Functional Limits Labial Symmetry: Within Functional Limits Lingual ROM: Within Functional Limits Lingual Symmetry: Within Functional Limits Facial ROM: Within Functional Limits Facial Symmetry: Within Functional Limits   Ice Chips Ice chips: Not tested   Thin Liquid Thin Liquid: Within functional limits    Nectar Thick Nectar Thick Liquid: Not tested   Honey Thick Honey Thick Liquid: Not tested   Puree Puree: Within functional limits   Solid   GO    Solid: Within functional limits       Bermudez-Bosch, Neil Brickell 06/04/2014,12:09 PM

## 2014-06-04 NOTE — Clinical Social Work Note (Signed)
[facility] Anticipated discharge date: 06/04/14 Family notified: Toniann FailWendy- niece Transportation by [facility]- RN to call when pt is ready for DC  CSW signing off.  Merlyn LotJenna Holoman, LCSWA Clinical Social Worker [phone]

## 2014-06-04 NOTE — Progress Notes (Signed)
UR completed 

## 2014-06-04 NOTE — Progress Notes (Signed)
Patient's MRSA PCR is positive, initiated orders per protocol

## 2014-06-04 NOTE — Progress Notes (Signed)
Subjective: Joshua Gonzales feels well this morning. No pain or complaints.   Objective: Vital signs in last 24 hours: Filed Vitals:   06/03/14 2323 06/04/14 0327 06/04/14 0500 06/04/14 0739  BP:  117/77 125/79 109/70  Pulse:    69  Temp: 98.1 F (36.7 C)  97.3 F (36.3 C) 98.1 F (36.7 C)  TempSrc:   Axillary Axillary  Resp:  Height:      Weight:   150 lb 9.2 oz (68.3 kg)   SpO2:  96%  94%   Weight change:   Intake/Output Summary (Last 24 hours) at 06/04/14 1037 Last data filed at 06/04/14 1025  Gross per 24 hour  Intake [id number] ml  Output    840 ml  Net [id number] ml   Physical Exam: General: resting in bed, NAD, pleasantly confused HEENT: PERRL, EOMI, no scleral icterus, oropharynx with dry mucous membranes Cardiac: RRR, no rubs, murmurs or gallops though auscultation limited by gastric sounds Pulm: clear to auscultation bilaterally, no wheezes, rales, or rhonchi Abd: soft, nontender, nondistended, BS present Ext: warm and well perfused, no pedal or tibial edema Neuro: Oriented to name and city, but not to hospital or time or age,  I: smell Not tested  II: visual acuity  Grossly intact  II: visual fields Full on finger count  II: pupils Equal, round, reactive to light  III,VII: ptosis None  III,IV,VI: extraocular muscles  Full ROM  V: mastication Normal  V: facial light touch sensation  Normal  V,VII: corneal reflex  Not tested  VII: facial muscle function - upper  Normal  VII: facial muscle function - lower Normal  VIII: hearing Full and symmetric  IX: soft palate elevation  Normal  IX,X: gag reflex Not tested  XI: trapezius strength  5/5  XI: sternocleidomastoid strength 5/5  XI: neck flexion strength  5/5  XII: tongue strength  Normal  5/5 upper and lower extremity strength, sensation grossly intact, 2+ grip strength, 2+ reflexes, patient had difficulty following FNF instructions, but appears to lack some  coordination  Skin: skin tenting  Lab Results: Basic Metabolic Panel:  Recent Labs Lab 06/03/14 1644 06/03/14 1652 06/04/14 0328  NA 142 141 141  K 4.6 4.4 3.7  CL 104 103 108  CO2 26  --  24  GLUCOSE 159* 160* 98  BUN CREATININE 1.72* 1.50* 1.04  CALCIUM 9.3  --  9.0   Liver Function Tests:  Recent Labs Lab 06/03/14 1644  AST 22  ALT 11  ALKPHOS 73  BILITOT 0.7  PROT 7.6  ALBUMIN 3.9   CBC:  Recent Labs Lab 06/03/14 1644 06/03/14 1652 06/04/14 0328  WBC 9.6  --  8.7  NEUTROABS 6.1  --   --   HGB 14.7 16.0 13.4  HCT 45.1 47.0 39.2  MCV 95.1  --  92.0  PLT 174  --  191   Cardiac Enzymes:  Recent Labs Lab 06/03/14 1654 06/04/14 0238 06/04/14 0349  CKTOTAL  --   --  124  TROPONINI <0.03 <0.03  --    CBG:  Recent Labs Lab 06/03/14 1632 06/04/14 0612 06/04/14 0738  GLUCAP 123* 75 76   Urine Drug Screen: Drugs of Abuse     Component Value Date/Time   LABOPIA NONE DETECTED 04/20/2013 0015   COCAINSCRNUR NONE DETECTED 04/20/2013 0015   LABBENZ NONE DETECTED 04/20/2013 0015   AMPHETMU NONE DETECTED 04/20/2013 0015   THCU NONE DETECTED 04/20/2013 0015  LABBARB NONE DETECTED 04/20/2013 0015    Alcohol Level:  Recent Labs Lab 06/03/14 1644  ETH <5   Urinalysis:  Recent Labs Lab 06/03/14 1649  COLORURINE YELLOW  LABSPEC 1.021  PHURINE 5.0  GLUCOSEU NEGATIVE  HGBUR NEGATIVE  BILIRUBINUR NEGATIVE  KETONESUR NEGATIVE  PROTEINUR NEGATIVE  UROBILINOGEN 0.2  NITRITE NEGATIVE  LEUKOCYTESUR NEGATIVE   Micro Results: Recent Results (from the past 240 hour(s))  MRSA PCR Screening     Status: Abnormal   Collection Time: 06/03/14 10:04 PM  Result Value Ref Range Status   MRSA by PCR POSITIVE (A) NEGATIVE Final    Comment:        The GeneXpert MRSA Assay (FDA approved for NASAL specimens only), is one component of a comprehensive MRSA colonization surveillance program. It is not intended to diagnose MRSA infection nor to  guide or monitor treatment for MRSA infections. RESULT CALLED TO, READ BACK BY AND VERIFIED WITH: CALLED TO RN [facility] PETTIFORD [phone] @0001  THANEY    Studies/Results: Dg Chest 2 View  06/04/2014   CLINICAL DATA:  Subsequent evaluation for near syncope, a Alzheimer's disease  EXAM: CHEST  2 VIEW  COMPARISON:  06/03/2014  FINDINGS: Increased linear opacities at both lung bases, similar to prior study. Conspicuity is increased bilaterally likely at least partially due to technique.  Mild cardiac enlargement stable. Uncoiling and calcification of the aorta stable. Stable elevation of the left diaphragm. No pleural effusion.  IMPRESSION: Bibasilar opacities similar to prior study, with increased conspicuity likely due at least in part to technique. Bibasilar scarring again suspected.   Electronically Signed   By: Esperanza Heiraymond  Rubner M.D.   On: 06/04/2014 08:31   Dg Chest 2 View  06/03/2014   CLINICAL DATA:  Near syncope.  Alzheimer's disease.  EXAM: CHEST  2 VIEW  COMPARISON:  10/07/2013  FINDINGS: Numerous leads and wires project over the chest. Midline trachea. Mild cardiomegaly. Atherosclerosis in the transverse aorta. No pleural effusion or pneumothorax. Bullous disease in the upper lobes, most apparent anteriorly on the lateral view. Lower lobe predominant interstitial thickening. Left hemidiaphragm elevation is new. Left lower lobe opacity on the frontal view. Not well visualized on the lateral view. Right mid lung opacity is similar and favored to represent scarring.  IMPRESSION: Left base opacity on the frontal radiograph with concurrent new left hemidiaphragm elevation. This could represent atelectasis or mild infection/aspiration.  Lower lobe predominant interstitial thickening, favored to be related to COPD/ chronic bronchitis.   Electronically Signed   By: Jeronimo GreavesKyle  Talbot M.D.   On: 06/03/2014 18:16   Medications: I have reviewed the patient's current medications. Scheduled Meds: . aspirin  325 mg  Oral Daily  . Chlorhexidine Gluconate Cloth  6 each Topical Q0600  . heparin  5,000 Units Subcutaneous 3 times per day  . latanoprost  1 drop Both Eyes QHS  . levETIRAcetam  500 mg Oral BID  . mupirocin ointment  1 application Nasal BID  . sodium chloride  3 mL Intravenous Q12H   Continuous Infusions:  PRN Meds:. Assessment/Plan: Principal Problem:   Near syncope Active Problems:   Essential hypertension, benign   History of seizures   Dementia with behavioral disturbance   PAF (paroxysmal atrial fibrillation)   Hypotension   AKI (acute kidney injury)  Joshua Gonzales is a 74 yo man with a history of CVA, seizure disorder, dementia and multiple syncopal events without identified cardiac or neurologic cause who represented with a syncopal episode. He had recently been started  on keppra 500 mg BID at his last hospitalization, after his first witnessed seizure event. Prior to this admission, staff at his SNF found him foaming at the mouth and with increased confusion, convincing for a breakthrough seizure. He will be put on a higher dose of keppra and will return to his SNF today.  Likely Recurrent Seizure: Patient has a history of seizures and was found to have fallen in the bathroom at his SNF with foaming at the mouth suggestive of seizure. Had elevated lactate on admission, which is also suggestive. He is back to his baseline with no focal neurologic deficits. Echo was completed and revealed EF 55-60% with grade 1 diastolic dysfunction (unchanged from prior in 2014); also with some mild hypokinesis of the apical anterior and lateral myocardium. CXR with bibasilar opacities unchanged from prior study, no infiltrate or other signs of infection.  - Increase home keppra from 500 mg BID to 750 mg BID given likely seizure activity - Continue telemetry  Dehydration and Resolved AKI: Creatinine 1.72-->1.04. Baseline 0.9-1.2.  - Resume lisinopril at discharge   - Holding aricept and paxil while  rehydrating  Dementia: Multiple risk factors for dementia; signs of multiple prior infarcts on CT during last admission. Also has a history of alcohol abuse. Positive RPR in past with [facility] on 11/27/12 and CSF evaluated over concern for neurosyphilis. Per Dr. Ninetta Lights, CSF at that time not consistent with neurosyphilis. On aricept 10 mg at bedtime and paxil 10 mg daily. - Holding home medications   Paroxysmal atrial fibrillation not on anticoagulation: CHADS2VASC score 5 though not on anticoagulation for high fall risk. EKG with irregular rate though controlled. -Telemetry as noted above  History of CVA: Continue aspirin 325 mg  Hypertension: Home medications include Norvasc 5 mg, lisinopril 5 mg. - Holding the setting pending improvement in his blood pressure  Glaucoma: Continue latanoprost 1 drop in both eyes at bedtime  Diet: HH  DVT Ppx: [place] heparin  Dispo: Disposition is deferred at this time, awaiting improvement of current medical problems.  Anticipated discharge in approximately 0 day(s).   The patient does not have a current PCP (No primary care provider on file.) and does not need an [facility] hospital follow-up appointment after discharge.  The patient does have transportation limitations that hinder transportation to clinic appointments.  .Services Needed at time of discharge: Y = Yes, Blank = No PT:   OT:   RN:   Equipment:   Other:     LOS: 1 day   Stark Bray, MD 06/04/2014, 10:37 AM

## 2014-06-04 NOTE — Discharge Instructions (Signed)
Please increase your keppra dose to 750 mg twice per day.  Keep up your food and drink at home. You were dehydrated on this admission.

## 2014-06-04 NOTE — Progress Notes (Signed)
  Echocardiogram 2D Echocardiogram has been performed.  Joshua Gonzales, Joshua Gonzales 06/04/2014, 10:42 AM

## 2014-06-04 NOTE — Progress Notes (Signed)
[facility]  Transport has been called.  Awaiting for their arrival.

## 2014-06-05 LAB — URINE CULTURE
COLONY COUNT: NO GROWTH
Culture: NO GROWTH

## 2014-06-10 LAB — CULTURE, BLOOD (ROUTINE X 2)
CULTURE: NO GROWTH
CULTURE: NO GROWTH

## 2014-07-03 ENCOUNTER — Emergency Department (HOSPITAL_COMMUNITY)
Admission: EM | Admit: 2014-07-03 | Discharge: 2014-07-04 | Disposition: A | Discharge status: Home / Self Care | Payer: Medicare Other | Attending: Emergency Medicine | Admitting: Emergency Medicine

## 2014-07-03 ENCOUNTER — Encounter (HOSPITAL_COMMUNITY): Payer: Self-pay | Admitting: Emergency Medicine

## 2014-07-03 ENCOUNTER — Emergency Department (HOSPITAL_COMMUNITY): Payer: Medicare Other

## 2014-07-03 DIAGNOSIS — I10 Essential (primary) hypertension: Secondary | ICD-10-CM | POA: Insufficient documentation

## 2014-07-03 DIAGNOSIS — Z79899 Other long term (current) drug therapy: Secondary | ICD-10-CM | POA: Insufficient documentation

## 2014-07-03 DIAGNOSIS — Z87891 Personal history of nicotine dependence: Secondary | ICD-10-CM | POA: Diagnosis not present

## 2014-07-03 DIAGNOSIS — G40909 Epilepsy, unspecified, not intractable, without status epilepticus: Secondary | ICD-10-CM | POA: Diagnosis not present

## 2014-07-03 DIAGNOSIS — G309 Alzheimer's disease, unspecified: Secondary | ICD-10-CM | POA: Diagnosis not present

## 2014-07-03 DIAGNOSIS — F0280 Dementia in other diseases classified elsewhere without behavioral disturbance: Secondary | ICD-10-CM | POA: Insufficient documentation

## 2014-07-03 DIAGNOSIS — Z7982 Long term (current) use of aspirin: Secondary | ICD-10-CM | POA: Insufficient documentation

## 2014-07-03 DIAGNOSIS — M25511 Pain in right shoulder: Secondary | ICD-10-CM | POA: Diagnosis not present

## 2014-07-03 MED ORDER — ACETAMINOPHEN 500 MG PO TABS
1000.0000 mg | ORAL_TABLET | Freq: Once | ORAL | Status: AC
  Administered 2014-07-03: 1000 mg via ORAL
  Filled 2014-07-03: qty 2

## 2014-07-03 NOTE — Discharge Instructions (Signed)
1. Medications: Acetaminophen as needed for pain, usual home medications 2. Treatment: rest, drink plenty of fluids, gentle stretching of the shoulder, ice and heat for the shoulder 3. Follow Up: Please followup with your primary doctor in 3-5 days for discussion of your diagnoses and further evaluation after today's visit; if you do not have a primary care doctor use the resource guide provided to find one;      Arthralgia Your caregiver has diagnosed you as suffering from an arthralgia. Arthralgia means there is pain in a joint. This can come from many reasons including:  Bruising the joint which causes soreness (inflammation) in the joint.  Wear and tear on the joints which occur as we grow older (osteoarthritis).  Overusing the joint.  Various forms of arthritis.  Infections of the joint. Regardless of the cause of pain in your joint, most of these different pains respond to anti-inflammatory drugs and rest. The exception to this is when a joint is infected, and these cases are treated with antibiotics, if it is a bacterial infection. HOME CARE INSTRUCTIONS   Rest the injured area for as long as directed by your caregiver. Then slowly start using the joint as directed by your caregiver and as the pain allows. Crutches as directed may be useful if the ankles, knees or hips are involved. If the knee was splinted or casted, continue use and care as directed. If an stretchy or elastic wrapping bandage has been applied today, it should be removed and re-applied every 3 to 4 hours. It should not be applied tightly, but firmly enough to keep swelling down. Watch toes and feet for swelling, bluish discoloration, coldness, numbness or excessive pain. If any of these problems (symptoms) occur, remove the ace bandage and re-apply more loosely. If these symptoms persist, contact your caregiver or return to this location.  For the first 24 hours, keep the injured extremity elevated on pillows while  lying down.  Apply ice for 15-20 minutes to the sore joint every couple hours while awake for the first half day. Then 03-04 times per day for the first 48 hours. Put the ice in a plastic bag and place a towel between the bag of ice and your skin.  Wear any splinting, casting, elastic bandage applications, or slings as instructed.  Only take over-the-counter or prescription medicines for pain, discomfort, or fever as directed by your caregiver. Do not use aspirin immediately after the injury unless instructed by your physician. Aspirin can cause increased bleeding and bruising of the tissues.  If you were given crutches, continue to use them as instructed and do not resume weight bearing on the sore joint until instructed. Persistent pain and inability to use the sore joint as directed for more than 2 to 3 days are warning signs indicating that you should see a caregiver for a follow-up visit as soon as possible. Initially, a hairline fracture (break in bone) may not be evident on X-rays. Persistent pain and swelling indicate that further evaluation, non-weight bearing or use of the joint (use of crutches or slings as instructed), or further X-rays are indicated. X-rays may sometimes not show a small fracture until a week or 10 days later. Make a follow-up appointment with your own caregiver or one to whom we have referred you. A radiologist (specialist in reading X-rays) may read your X-rays. Make sure you know how you are to obtain your X-ray results. Do not assume everything is normal if you do not hear from [place].  SEEK MEDICAL CARE IF: Bruising, swelling, or pain increases. SEEK IMMEDIATE MEDICAL CARE IF:   Your fingers or toes are numb or blue.  The pain is not responding to medications and continues to stay the same or get worse.  The pain in your joint becomes severe.  You develop a fever over 102 F (38.9 C).  It becomes impossible to move or use the joint. MAKE SURE YOU:   Understand  these instructions.  Will watch your condition.  Will get help right away if you are not doing well or get worse. Document Released: 02/20/2005 Document Revised: 05/15/2011 Document Reviewed: 10/09/2007 [facility] Patient Information 2015 [place], [place]. This information is not intended to replace advice given to you by your health care provider. Make sure you discuss any questions you have with your health care provider.    Adhesive Capsulitis Sometimes the shoulder becomes stiff and is painful to move. Some people say it feels as if the shoulder is frozen in place. Because of this, the condition is called "frozen shoulder." Its medical name is adhesive capsulitis.  The shoulder joint is made up of strong connective tissue that attaches the ball of the humerus to the shallow shoulder socket. This strong connective tissue is called the joint capsule. This tissue can become stiff and swollen. That is when adhesive capsulitis sets in. CAUSES  It is not always clear just what the cause adhesive capsulitis. Possibilities include:  Injury to the shoulder joint.  Strain. This is a repetitive injury brought about by overuse.  Lack of use. Perhaps your arm or hand was otherwise injured. It might have been in a sling for awhile. Or perhaps you were not using it to avoid pain.  Referred pain. This is a sort of trick the body plays. You feel pain in the shoulder. But, the pain actually comes from an injury somewhere else in the body.  Long-standing health problems. Several diseases can cause adhesive capsulitis. They include diabetes, heart disease, stroke, thyroid problems, rheumatoid arthritis and lung disease.  Being a women older than 40. Anyone can develop adhesive capsulitis but it is most common in women in this age group. SYMPTOMS   Pain.  It occurs when the arm is moved.  Parts of the shoulder might hurt if they are touched.  Pain is worse at night or when resting.  Soreness. It  might not be strong enough to be called pain. But, the shoulder aches.  The shoulder does not move freely.  Muscle spasms.  Trouble sleeping because of shoulder ache or pain. DIAGNOSIS  To decide if you have adhesive capsulitis, your healthcare provider will probably:  Ask about symptoms you have noticed.  Ask about your history of joint pain and anything that might have caused the pain.  Ask about your overall health.  Use hands to feel your shoulder and neck.  Ask you to move your shoulder in specific directions. This may indicate the origin of the pain.  Order imaging tests; pictures of the shoulder. They help pinpoint the source of the problem. An X-ray might be used. For more detail, an MRI is often used. An MRI details the tendons, muscles and ligaments as well as the joint. TREATMENT  Adhesive capsulitis can be treated several ways. Most treatments can be done in a clinic or in your healthcare provider's office. Be sure to discuss the different options with your caregiver. They include:  Physical therapy. You will work on specific exercises to get your shoulder moving  again. The exercises usually involve stretching. A physical therapist (a caregiver with special training) can show you what to do and what not to do. The exercises will need to be done daily.  Medication.  Over-the-counter medicines may relieve pain and inflammation (the body's way of reacting to injury or infection).  Corticosteroids. These are stronger drugs to reduce pain and inflammation. They are given by injection (shots) into the shoulder joint. Frequent treatment is not recommended.  Muscle relaxants. Medication may be prescribed to ease muscle spasms.  Treatment of underlying conditions. This means treating another condition that is causing your shoulder problem. This might be a rotator cuff (tendon) problem  Shoulder manipulation. The shoulder will be moved by your healthcare provider. You would be  under general anesthesia (given a drug that puts you to sleep). You would not feel anything. Sometimes the joint will be injected with salt water (saline) at high pressure to break down internal scarring in the joint capsule.  Surgery. This is rarely needed. It may be suggested in advanced cases after all other treatment has failed. PROGNOSIS  In time, most people recover from adhesive capsulitis. Sometimes, however, the pain goes away but full movement of the shoulder does not return.  HOME CARE INSTRUCTIONS   Take any pain medications recommended by your healthcare provider. Follow the directions carefully.  If you have physical therapy, follow through with the therapist's suggestions. Be sure you understand the exercises you will be doing. You should understand:  How often the exercises should be done.  How many times each exercise should be repeated.  How long they should be done.  What other activities you should do, or not do.  That you should warm up before doing any exercise. Just 5 to 10 minutes will help. Small, gentle movements should get your shoulder ready for more.  Avoid high-demand exercise that involves your shoulder such as throwing. This type of exercise can make pain worse.  Consider using cold packs. Cold may ease swelling and pain. Ask your healthcare provider if a cold pack might help you. If so, get directions on how and when to use them. SEEK MEDICAL CARE IF:   You have any questions about your medications.  Your pain continues to increase. Document Released: 12/18/2008 Document Revised: 05/15/2011 Document Reviewed: 12/18/2008 [facility] Patient Information 2015 [place], [place]. This information is not intended to replace advice given to you by your health care provider. Make sure you discuss any questions you have with your health care provider.    Emergency Department Resource Guide 1) Find a Doctor and Pay Out of Pocket Although you won't have to find  out who is covered by your insurance plan, it is a good idea to ask around and get recommendations. You will then need to call the office and see if the doctor you have chosen will accept you as a new patient and what types of options they offer for patients who are self-pay. Some doctors offer discounts or will set up payment plans for their patients who do not have insurance, but you will need to ask so you aren't surprised when you get to your appointment.  2) Contact Your Local Health Department Not all health departments have doctors that can see patients for sick visits, but many do, so it is worth a call to see if yours does. If you don't know where your local health department is, you can check in your phone book. The CDC also has a tool to  help you locate your state's health department, and many state websites also have listings of all of their local health departments.  3) Find a Walk-in Clinic If your illness is not likely to be very severe or complicated, you may want to try a walk in clinic. These are popping up all over the country in pharmacies, drugstores, and shopping centers. They're usually staffed by nurse practitioners or physician assistants that have been trained to treat common illnesses and complaints. They're usually fairly quick and inexpensive. However, if you have serious medical issues or chronic medical problems, these are probably not your best option.  No Primary Care Doctor: - Call Health Connect at  [phone] - they can help you locate a primary care doctor that  accepts your insurance, provides certain services, etc. - Physician Referral Service- [phone]  Chronic Pain Problems: Organization         Address  Phone   Notes  [facility]  [phone] Patients need to be referred by their primary care doctor.   Medication Assistance: Organization         Address  Phone   Notes  [facility] Medication [facility] [address]  [place] [place], [place] [address] [phone] --Must be a resident of [facility] -- Must have NO insurance coverage whatsoever (no Medicaid/ Medicare, etc.) -- The pt. MUST have a primary care doctor that directs their care regularly and follows them in the community   Med[facility]ist  [phone]   [employer]  [phone]    Agencies that provide inexpensive medical care: Organization         Address  Phone   Notes  [facility]  [phone]   [facility]    [phone]   [facility] [address] [place], [place] [address] [phone]   [facility] [address] [place] [place] [phone]   Planned Parenthood    [phone]   [facility]    [phone]   [facility]  [address], [place] Phone:  [phone], Fax:  [phone] Hours of Operation:  9 am - 6 pm, M-F.  Also accepts Medicaid/Medicare and self-pay.  [facility]  [address], [place] Phone: [phone], Fax: [phone]. Hours of Operation:  8:30 am - 5:30 pm, M-F.  Also accepts Medicaid and self-pay.  [facility] [address], [place] Phone: [phone]   [facility] [address] Natasha Bence [place], [place] [phone], Ext. [phone] Mondays & Thursdays: 7-9 AM.  First 15 patients are seen on a first come, first serve basis.    Medicaid-accepting [facility] Providers:  Organization         Address  Phone   Notes  [facility] [address], [place] [phone] Also accepts self-pay patients.  [facility] [address] Laurell Josephs [place], [place]  [phone]   [facility] [address], [place] [phone]   [facility] [address] [place] [phone]   Renaye Rakers  [address], [place]   [phone] Only accepts [place] Access [place] patients after they have their name applied to their card.   Self-Pay (no insurance) in [facility]:  Organization  Address  Phone   Notes  Sickle Cell Patients, [facility] [place] [phone]   [facility] [place] [phone]   [facility] [place]  [address], [place] [phone]   [facility]/Dr. Osei-Bonsu  [address], [place] or [address], [place] [phone] Phone number for both [place] and [place] locations is the same.  [facility] [address] [place] [address], [place] [phone]   [facility] [address], [place] or [address] [place] [address] [phone]   [facility] [address], [place] [phone], phone; [phone], fax Sees patients 1st and 3rd Saturday of every month.  Must not qualify for public or private insurance (i.e. Medicaid, Medicare, [place] Health Choice, Veterans' Benefits)  Household income should be no more than 200% of the poverty level The clinic cannot treat you if you are pregnant or think you are pregnant  Sexually transmitted diseases are not treated at the clinic.    Dental Care: Organization         Address  Phone  Notes  [facility] [phone] Accepts children up to age 23 who are enrolled in [place] or [place]; pregnant women with a Medicaid card; and children who have applied for Medicaid or [place] Health Choice, but were declined, whose parents can pay a reduced fee at time of service.  [facility]  [address], [place] [phone] Accepts children up to age 1  who are enrolled in [place] or [place]; pregnant women with a Medicaid card; and children who have applied for Medicaid or [facility] Health Choice, but were declined, whose parents can pay a reduced fee at time of service.  [facility] Adult [facility] Access PROGRAM  [place] [phone] Patients are seen by appointment only. Walk-ins are not accepted. [place] will see patients 45 years of age and older. Monday - Tuesday (8am-5pm) Most Wednesdays (8:30-5pm) $30 per visit, cash only  [facility] Adult Dental Access PROGRAM  [address], [facility] [phone] Patients are seen by appointment only. Walk-ins are not accepted. [place] will see patients 66 years of age and older. One Wednesday Evening (Monthly: Volunteer Based).  $30 per visit, cash only  [place]  [phone] for adults; Children under age 27, call Graduate Pediatric Dentistry at [phone]. Children aged 15-14, please call [phone] to request a pediatric application.  Dental services are provided in all areas of dental care including fillings, crowns and bridges, complete and partial dentures, implants, gum treatment, root canals, and extractions. Preventive care is also provided. Treatment is provided to both adults and children. Patients are selected via a lottery and there is often a waiting list.   [facility] [address], [place]  [phone] [email or web address]   [facility] [address] [place], [place] [phone], Ext. [phone] Second and Fourth Thursday of each month, opens at 6:30 AM; Clinic ends at 9 AM.  Patients are seen on a first-come first-served basis, and a limited number are seen during each clinic.   [facility]  [address] [place],  [place] [phone]   Eligibility Requirements You must have lived in [place], [place], or [place] counties for at least the last three months.    You cannot be eligible for state or federal sponsored [employer], including [employer], [place], or [employer].   You generally cannot be eligible for healthcare insurance through your employer.    How to apply: Eligibility screenings are held every Tuesday and Wednesday afternoon from 1:00 pm until 4:00 pm. You do not need an appointment for the interview!  [facility] [address], [place], [place]   [place]  [place] Department  [place]  [phone]    Behavioral Health Resources in the Community: Intensive Outpatient Programs Organization         Address  Phone  Notes  [place]. [address], [place], [place] [phone]   [facility] Outpatient [address] [place], [place]   ADS: [facility] [address] [place], [place]   [place] [address]  [place], [place] or [phone]   Substance Abuse Resources Organization         Address  Phone  Notes  Al[facility] Drug Services  [phone]   [place]  [phone]   [facility] [place]   Chinita Pester  [phone]   Residential & Outpatient Substance Abuse Program  [phone]   Psychological Services Organization         Address  Phone  Notes  [facility] [place]  [place]  [phone]   [place] [address] [place] or [phone]    Mobile Crisis Teams Organization         Address  Phone  Notes  [facility], Mobile Crisis Care Unit  [phone]   [facility]  [address] [place], [place]   [place] Levels [address], [place] [phone]    Self-Help/Support  Groups Organization         Address  Phone             Notes  [place]. of [place] - variety of support groups  [place] Call for more information  Narcotics Anonymous (NA), [facility] [address], [employer] [place]  2 meetings at this location   Special educational needs teacher         Address  Phone  Notes  [facility] [place],    [place]  [phone]   [facility]  [address], [place] 734193, [place], [place]   [place], [place] [phone] Admissions: 8am-3pm M-F  Incentives Substance [place] [address],    [place], [place] [phone]   [facility] [address] Jadene Pierini [place], [place]   [facility] [address],  [place], [place]   [facility] - Intensive Outpatient [address] Kristeen [address], [place], [place]   [facility] ([place].,  [place], [place] or [phone]   [facility] (RTS) [address] [place], [place] Accepts Medicaid  [facility] [place] [address]  [place] [phone] Substance Abuse/Addiction Treatment   [facility] Resources Organization  Address  Phone  Notes  [facility]  [phone]) [phone]   Angie FavaJulie Brannon, PhD [address], [place] [address] [place], [place]   [phone] or [phone]) [phone]   [facility] [place]   [address] [place], [place] [phone]) [phone]   [facility] [address], [place], [place] [phone] Insurance/Medicaid/sponsorship through [facility]  Faith and Families [address]                                    [place], [place] [phone] Therapy/tele-psych/case  [facility] [address]   [place], [place] [phone]    Dr. Lolly MustacheArfeen  [phone]) [phone]   [facility] [place]  [facility]  [employer] [facility] 1) [address], [place] 2) [address], [place] 3)  [address], [place] [phone]) [phone]) [phone]  [phone]) [phone]   [facility] Child Abuse Hotline [phone](336) [phone] or [phone](336) [phone] (After Hours)

## 2014-07-03 NOTE — ED Provider Notes (Signed)
CSN: [id number]     Arrival date & time 07/03/14  1948 History   First MD Initiated Contact with Patient 07/03/14 2006     Chief Complaint  Patient presents with  . Shoulder Pain     (Consider location/radiation/quality/duration/timing/severity/associated sxs/prior Treatment) The history is provided by the patient and medical records. No language interpreter was used.     Pieter PartridgeJohn H Kinder is a 74 y.o. male  with a hx of HTN, seizures, Alzhemier's disease presents to the Emergency Department complaining of right shoulder pain. Patient denies trauma.  [facility] or known trauma to the shoulder.  Pt rates pain at a 4/10 and reports decreased movement.  Nothing has been given for pain control.  Level 5 caveat.       Past Medical History  Diagnosis Date  . Hypertension   . Seizures   . Alzheimer disease    History reviewed. No pertinent past surgical history. Family History  Problem Relation Age of Onset  . Hyperlipidemia Mother   . Hypertension Mother   . Hypertension Father   . Hyperlipidemia Father    History  Substance Use Topics  . Smoking status: Former Games developermoker  . Smokeless tobacco: Not on file  . Alcohol Use: Yes    Review of Systems  Unable to perform ROS: Dementia  Musculoskeletal: Positive for arthralgias.  Neurological: Negative for numbness.      Allergies  Review of patient's allergies indicates no known allergies.  Home Medications   Prior to Admission medications   Medication Sig Start Date End Date Taking? Authorizing Provider  acetaminophen (TYLENOL) 500 MG tablet Take 500 mg by mouth every 6 (six) hours as needed.   Yes Historical Provider, MD  amLODipine (NORVASC) 5 MG tablet Take 2 tablets (10 mg total) by mouth daily. 09/10/13  Yes April Palumbo, MD  aspirin 325 MG tablet Take 1 tablet (325 mg total) by mouth daily. 05/16/14  Yes Stark BrayJulia E Mallory, MD  donepezil (ARICEPT) 10 MG tablet Take 10 mg by mouth at bedtime.    Yes Historical Provider, MD  latanoprost (XALATAN) 0.005 % ophthalmic solution Place 1 drop into both eyes at bedtime.   Yes Historical Provider, MD  levETIRAcetam (KEPPRA) 750 MG tablet Take 1 tablet (750 mg total) by mouth 2 (two) times daily. 06/04/14  Yes Stark BrayJulia E Mallory, MD  lisinopril (PRINIVIL,ZESTRIL) 5 MG tablet Take 5 mg by mouth daily.   Yes Historical Provider, MD  PARoxetine (PAXIL) 10 MG tablet Take 10 mg by mouth daily.   Yes Historical Provider, MD  acetaminophen (TYLENOL) 325 MG tablet Take 650 mg by mouth every 6 (six) hours as needed for mild pain.    Historical Provider, MD  guaiFENesin (ROBITUSSIN) 100 MG/5ML liquid Take 200 mg by mouth every 6 (six) hours as needed for cough.    Historical Provider, MD  loperamide (IMODIUM) 2 MG capsule Take 2 mg by mouth as needed for diarrhea or loose stools.    Historical Provider, MD  magnesium hydroxide (MILK OF MAGNESIA) 400 MG/5ML suspension Take 30 mLs by mouth at bedtime as needed for mild constipation.    Historical Provider, MD  Neomycin-Bacitracin-Polymyxin (TRIPLE ANTIBIOTIC EX) Apply 1 application topically daily as needed (skin tears).    Historical Provider, MD  Vitamin D, Ergocalciferol, (DRISDOL) 50000 UNITS CAPS capsule Take 50,000 Units by mouth every 7 (seven) days.    Historical Provider, MD   BP 130/76 mmHg  Pulse 62  Temp(Src) 98.9  F (37.2 C) (Oral)  Resp 18  SpO2 95% Physical Exam  Constitutional: He appears well-developed and well-nourished. No distress.  HENT:  Head: Normocephalic and atraumatic.  Eyes: Conjunctivae are normal.  Neck: Normal range of motion.  Cardiovascular: Normal rate, regular rhythm and intact distal pulses.   Capillary refill < 3 sec  Pulmonary/Chest: Effort normal and breath sounds normal.  Musculoskeletal: He exhibits tenderness. He exhibits no edema.  ROM: Minimal ROM to the right shoulder; Full ROM of the right elbow including pronation and supination, right wrist and all fingers of  the right hand No swelling or deformity to the right shoulder NO TTP of the right shoulder  Neurological: He is alert. Coordination normal.  Sensation intact to normal touch in the BUE Strength 0/5 in the right shoulder, 4/5 in the right elbow and 5/5 in the right wrist and hand  Skin: Skin is warm and dry. He is not diaphoretic.  No tenting of the skin  Psychiatric: He has a normal mood and affect.  Nursing note and vitals reviewed.   ED Course  Procedures (including critical care time) Labs Review Labs Reviewed - No data to display  Imaging Review Dg Shoulder Right  07/03/2014   CLINICAL DATA:  Right shoulder pain without trauma.  Demented.  EXAM: RIGHT SHOULDER - 2+ VIEW  COMPARISON:  None.  FINDINGS: Degenerate changes of the acromioclavicular joint and rotator cuff insertion. Visualized portion of the right hemithorax is normal. No acute fracture or dislocation.  IMPRESSION: Degenerative change, without acute osseous finding.   Electronically Signed   By: Jeronimo Greaves M.D.   On: 07/03/2014 21:09     EKG Interpretation None      MDM   Final diagnoses:  Arthralgia of right shoulder region    Pieter Partridge presents with nontraumatic right shoulder pain.  Pain worsens with shoulder movement and limited range of motion secondary to pain.  No erythema, induration or increased warmth to suggest septic joint. X-ray without acute abnormalities but returned of changes noted. Patient given Tylenol here in the emergency department with improvement in pain. Will be discharged back to skilled nursing facility.  The patient was discussed with and seen by Dr. Ethelda Chick who agrees with the treatment plan.  BP 130/76 mmHg  Pulse 62  Temp(Src) 98.9 F (37.2 C) (Oral)  Resp 18  SpO2 95%    Dierdre Forth, PA-C 07/03/14 2354  Doug Sou, MD 07/04/14 [phone]

## 2014-07-03 NOTE — ED Notes (Signed)
[facility] Expected date:  Expected time:  Means of arrival:  Comments: EMS [age] M Shoulder pain/no injury

## 2014-07-03 NOTE — ED Provider Notes (Signed)
Level V caveat dementia. Complaint of right shoulder pain. On exam patient is alert no distress. Pain mild at present. Pain is worse when he attempts to moves his shoulder. Limited range of shoulder secondary to pain. Shoulder without redness swelling or point tenderness. Radial pulse 2+. X-ray viewed by me   Doug SouSam Delonda Coley, MD 07/03/14 2157

## 2014-07-03 NOTE — ED Notes (Signed)
Patient transported to X-ray 

## 2014-07-03 NOTE — ED Notes (Addendum)
Pt arrived to the ED with a complaint of right shoulder pain. Patient and Nursing home staff deny any fall or traumatic event.  [facility] and was sent to ED by staff after patient complained of the pain in his shoulder.  Pt has dementia.  Pt is alert x 2.  Pt rates pain as a 4 out of 10.

## 2014-07-14 NOTE — Progress Notes (Signed)
   06/04/14 1200  SLP G-Codes **NOT FOR INPATIENT CLASS**  Functional Assessment Tool Used clinical judgement (swallowing)  Functional Limitations Swallowing  Swallow Current Status (Z6109(G8996) CI  Swallow Goal Status (U0454(G8997) CI  Swallow Discharge Status (U9811(G8998) CI  SLP Evaluations  $ SLP Speech Visit 1 Procedure  SLP Evaluations  $BSS Swallow 1 Procedure  $Swallowing Treatment 1 Procedure

## 2014-08-06 ENCOUNTER — Ambulatory Visit: Payer: Medicare Other | Admitting: Neurology

## 2014-08-13 ENCOUNTER — Ambulatory Visit: Payer: Medicare Other | Admitting: Neurology

## 2014-08-18 ENCOUNTER — Ambulatory Visit: Payer: Medicare Other | Admitting: Diagnostic Neuroimaging

## 2016-09-26 ENCOUNTER — Other Ambulatory Visit (HOSPITAL_COMMUNITY): Payer: Self-pay | Admitting: Internal Medicine

## 2016-09-26 DIAGNOSIS — S31000A Unspecified open wound of lower back and pelvis without penetration into retroperitoneum, initial encounter: Secondary | ICD-10-CM

## 2016-10-03 ENCOUNTER — Ambulatory Visit (HOSPITAL_COMMUNITY)
Admission: RE | Admit: 2016-10-03 | Discharge: 2016-10-03 | Disposition: A | Discharge status: Home / Self Care | Payer: Medicare Other | Source: Ambulatory Visit | Attending: Internal Medicine | Admitting: Internal Medicine

## 2016-10-03 DIAGNOSIS — X58XXXA Exposure to other specified factors, initial encounter: Secondary | ICD-10-CM | POA: Diagnosis not present

## 2016-10-03 DIAGNOSIS — S31000A Unspecified open wound of lower back and pelvis without penetration into retroperitoneum, initial encounter: Secondary | ICD-10-CM

## 2016-10-03 DIAGNOSIS — L89159 Pressure ulcer of sacral region, unspecified stage: Secondary | ICD-10-CM | POA: Insufficient documentation

## 2016-10-03 LAB — POCT I-STAT CREATININE: Creatinine, Ser: 0.6 mg/dL — ABNORMAL LOW (ref 0.61–1.24)

## 2016-10-03 MED ORDER — GADOBENATE DIMEGLUMINE 529 MG/ML IV SOLN
15.0000 mL | Freq: Once | INTRAVENOUS | Status: AC | PRN
  Administered 2016-10-03: 14 mL via INTRAVENOUS

## 2018-08-16 ENCOUNTER — Emergency Department (HOSPITAL_COMMUNITY): Payer: Medicare Other

## 2018-08-16 ENCOUNTER — Inpatient Hospital Stay (HOSPITAL_COMMUNITY)
Admission: EM | Admit: 2018-08-16 | Discharge: 2018-08-18 | DRG: 640 | Disposition: A | Discharge status: Skilled Nursing Facility | Payer: Medicare Other | Source: Skilled Nursing Facility | Attending: Internal Medicine | Admitting: Internal Medicine

## 2018-08-16 ENCOUNTER — Observation Stay (HOSPITAL_COMMUNITY): Payer: Medicare Other

## 2018-08-16 DIAGNOSIS — I6930 Unspecified sequelae of cerebral infarction: Secondary | ICD-10-CM | POA: Diagnosis not present

## 2018-08-16 DIAGNOSIS — Z79891 Long term (current) use of opiate analgesic: Secondary | ICD-10-CM

## 2018-08-16 DIAGNOSIS — R4182 Altered mental status, unspecified: Secondary | ICD-10-CM

## 2018-08-16 DIAGNOSIS — F0391 Unspecified dementia with behavioral disturbance: Secondary | ICD-10-CM | POA: Diagnosis not present

## 2018-08-16 DIAGNOSIS — Z87898 Personal history of other specified conditions: Secondary | ICD-10-CM | POA: Diagnosis not present

## 2018-08-16 DIAGNOSIS — T402X5A Adverse effect of other opioids, initial encounter: Secondary | ICD-10-CM | POA: Diagnosis present

## 2018-08-16 DIAGNOSIS — G40909 Epilepsy, unspecified, not intractable, without status epilepticus: Secondary | ICD-10-CM | POA: Diagnosis present

## 2018-08-16 DIAGNOSIS — R001 Bradycardia, unspecified: Secondary | ICD-10-CM | POA: Diagnosis present

## 2018-08-16 DIAGNOSIS — E7220 Disorder of urea cycle metabolism, unspecified: Secondary | ICD-10-CM | POA: Diagnosis present

## 2018-08-16 DIAGNOSIS — G9340 Encephalopathy, unspecified: Secondary | ICD-10-CM

## 2018-08-16 DIAGNOSIS — Z79899 Other long term (current) drug therapy: Secondary | ICD-10-CM

## 2018-08-16 DIAGNOSIS — E876 Hypokalemia: Secondary | ICD-10-CM | POA: Diagnosis present

## 2018-08-16 DIAGNOSIS — I10 Essential (primary) hypertension: Secondary | ICD-10-CM | POA: Diagnosis present

## 2018-08-16 DIAGNOSIS — E119 Type 2 diabetes mellitus without complications: Secondary | ICD-10-CM

## 2018-08-16 DIAGNOSIS — E860 Dehydration: Secondary | ICD-10-CM | POA: Diagnosis not present

## 2018-08-16 DIAGNOSIS — Z515 Encounter for palliative care: Secondary | ICD-10-CM

## 2018-08-16 DIAGNOSIS — F0281 Dementia in other diseases classified elsewhere with behavioral disturbance: Secondary | ICD-10-CM | POA: Diagnosis present

## 2018-08-16 DIAGNOSIS — Z7189 Other specified counseling: Secondary | ICD-10-CM

## 2018-08-16 DIAGNOSIS — G9341 Metabolic encephalopathy: Secondary | ICD-10-CM | POA: Diagnosis present

## 2018-08-16 DIAGNOSIS — F03918 Unspecified dementia, unspecified severity, with other behavioral disturbance: Secondary | ICD-10-CM | POA: Diagnosis present

## 2018-08-16 DIAGNOSIS — E1165 Type 2 diabetes mellitus with hyperglycemia: Secondary | ICD-10-CM | POA: Diagnosis present

## 2018-08-16 DIAGNOSIS — R627 Adult failure to thrive: Secondary | ICD-10-CM | POA: Diagnosis present

## 2018-08-16 DIAGNOSIS — F1011 Alcohol abuse, in remission: Secondary | ICD-10-CM | POA: Diagnosis present

## 2018-08-16 DIAGNOSIS — Z66 Do not resuscitate: Secondary | ICD-10-CM | POA: Diagnosis present

## 2018-08-16 DIAGNOSIS — Z7401 Bed confinement status: Secondary | ICD-10-CM

## 2018-08-16 DIAGNOSIS — Z7982 Long term (current) use of aspirin: Secondary | ICD-10-CM

## 2018-08-16 DIAGNOSIS — G309 Alzheimer's disease, unspecified: Secondary | ICD-10-CM | POA: Diagnosis present

## 2018-08-16 DIAGNOSIS — Z1159 Encounter for screening for other viral diseases: Secondary | ICD-10-CM

## 2018-08-16 DIAGNOSIS — Z87891 Personal history of nicotine dependence: Secondary | ICD-10-CM

## 2018-08-16 LAB — CBC WITH DIFFERENTIAL/PLATELET
Abs Immature Granulocytes: 0.01 10*3/uL (ref 0.00–0.07)
Basophils Absolute: 0 10*3/uL (ref 0.0–0.1)
Basophils Relative: 1 %
Eosinophils Absolute: 0.3 10*3/uL (ref 0.0–0.5)
Eosinophils Relative: 5 %
HCT: 46.3 % (ref 39.0–52.0)
Hemoglobin: 14.6 g/dL (ref 13.0–17.0)
Immature Granulocytes: 0 %
Lymphocytes Relative: 44 %
Lymphs Abs: 2.5 10*3/uL (ref 0.7–4.0)
MCH: 31 pg (ref 26.0–34.0)
MCHC: 31.5 g/dL (ref 30.0–36.0)
MCV: 98.3 fL (ref 80.0–100.0)
Monocytes Absolute: 0.5 10*3/uL (ref 0.1–1.0)
Monocytes Relative: 9 %
Neutro Abs: 2.3 10*3/uL (ref 1.7–7.7)
Neutrophils Relative %: 41 %
Platelets: 160 10*3/uL (ref 150–400)
RBC: 4.71 MIL/uL (ref 4.22–5.81)
RDW: 15.3 % (ref 11.5–15.5)
WBC: 5.6 10*3/uL (ref 4.0–10.5)
nRBC: 0 % (ref 0.0–0.2)

## 2018-08-16 LAB — POCT I-STAT EG7
Acid-Base Excess: 2 mmol/L (ref 0.0–2.0)
Bicarbonate: 28.6 mmol/L — ABNORMAL HIGH (ref 20.0–28.0)
Calcium, Ion: 1.23 mmol/L (ref 1.15–1.40)
HCT: 45 % (ref 39.0–52.0)
Hemoglobin: 15.3 g/dL (ref 13.0–17.0)
O2 Saturation: 99 %
Potassium: 3.7 mmol/L (ref 3.5–5.1)
Sodium: 142 mmol/L (ref 135–145)
TCO2: 30 mmol/L (ref 22–32)
pCO2, Ven: 50.8 mmHg (ref 44.0–60.0)
pH, Ven: 7.359 (ref 7.250–7.430)
pO2, Ven: 126 mmHg — ABNORMAL HIGH (ref 32.0–45.0)

## 2018-08-16 LAB — MRSA PCR SCREENING: MRSA by PCR: NEGATIVE

## 2018-08-16 LAB — PROTIME-INR
INR: 1.2 (ref 0.8–1.2)
Prothrombin Time: 14.9 seconds (ref 11.4–15.2)

## 2018-08-16 LAB — VALPROIC ACID LEVEL
Valproic Acid Lvl: 24 ug/mL — ABNORMAL LOW (ref 50.0–100.0)
Valproic Acid Lvl: 20 ug/mL — ABNORMAL LOW (ref 50.0–100.0)

## 2018-08-16 LAB — I-STAT CHEM 8, ED
BUN: 15 mg/dL (ref 8–23)
Calcium, Ion: 1.23 mmol/L (ref 1.15–1.40)
Chloride: 106 mmol/L (ref 98–111)
Creatinine, Ser: 0.7 mg/dL (ref 0.61–1.24)
Glucose, Bld: 170 mg/dL — ABNORMAL HIGH (ref 70–99)
HCT: 46 % (ref 39.0–52.0)
Hemoglobin: 15.6 g/dL (ref 13.0–17.0)
Potassium: 3.6 mmol/L (ref 3.5–5.1)
Sodium: 142 mmol/L (ref 135–145)
TCO2: 29 mmol/L (ref 22–32)

## 2018-08-16 LAB — URINALYSIS, COMPLETE (UACMP) WITH MICROSCOPIC
Bacteria, UA: NONE SEEN
Bilirubin Urine: NEGATIVE
Glucose, UA: 50 mg/dL — AB
Hgb urine dipstick: NEGATIVE
Ketones, ur: NEGATIVE mg/dL
Leukocytes,Ua: NEGATIVE
Nitrite: NEGATIVE
Protein, ur: 30 mg/dL — AB
Specific Gravity, Urine: 1.029 (ref 1.005–1.030)
pH: 5 (ref 5.0–8.0)

## 2018-08-16 LAB — TROPONIN I
Troponin I: <0.03 ng/mL (ref <0.03)
Troponin I: <0.03 ng/mL (ref <0.03)

## 2018-08-16 LAB — RAPID URINE DRUG SCREEN, HOSP PERFORMED
Amphetamines: NOT DETECTED
Barbiturates: NOT DETECTED
Benzodiazepines: NOT DETECTED
Cocaine: NOT DETECTED
Opiates: POSITIVE — AB
Tetrahydrocannabinol: NOT DETECTED

## 2018-08-16 LAB — COMPREHENSIVE METABOLIC PANEL
ALT: 12 U/L (ref 0–44)
AST: 18 U/L (ref 15–41)
Albumin: 3 g/dL — ABNORMAL LOW (ref 3.5–5.0)
Alkaline Phosphatase: 49 U/L (ref 38–126)
Anion gap: 8 (ref 5–15)
BUN: 14 mg/dL (ref 8–23)
CO2: 27 mmol/L (ref 22–32)
Calcium: 9.2 mg/dL (ref 8.9–10.3)
Chloride: 105 mmol/L (ref 98–111)
Creatinine, Ser: 0.86 mg/dL (ref 0.61–1.24)
GFR calc Af Amer: >60 mL/min (ref >60)
GFR calc non Af Amer: >60 mL/min (ref >60)
Glucose, Bld: 173 mg/dL — ABNORMAL HIGH (ref 70–99)
Potassium: 3.6 mmol/L (ref 3.5–5.1)
Sodium: 140 mmol/L (ref 135–145)
Total Bilirubin: 0.4 mg/dL (ref 0.3–1.2)
Total Protein: 7.4 g/dL (ref 6.5–8.1)

## 2018-08-16 LAB — PROCALCITONIN: Procalcitonin: <0.1 ng/mL

## 2018-08-16 LAB — GLUCOSE, CAPILLARY: Glucose-Capillary: 94 mg/dL (ref 70–99)

## 2018-08-16 LAB — CBG MONITORING, ED: Glucose-Capillary: 158 mg/dL — ABNORMAL HIGH (ref 70–99)

## 2018-08-16 LAB — AMMONIA: Ammonia: 37 umol/L — ABNORMAL HIGH (ref 9–35)

## 2018-08-16 LAB — TSH: TSH: 0.515 u[IU]/mL (ref 0.350–4.500)

## 2018-08-16 LAB — LACTIC ACID, PLASMA: Lactic Acid, Venous: 1.8 mmol/L (ref 0.5–1.9)

## 2018-08-16 MED ORDER — BRIMONIDINE TARTRATE-TIMOLOL 0.2-0.5 % OP SOLN: 1.0000 [drp] | Freq: Two times a day (BID) | OPHTHALMIC | Status: DC

## 2018-08-16 MED ORDER — LEVETIRACETAM 100 MG/ML PO SOLN
750.0000 mg | Freq: Two times a day (BID) | ORAL | Status: DC
  Administered 2018-08-16 – 2018-08-18 (×4): 750 mg via ORAL
  Filled 2018-08-16 (×5): qty 7.5

## 2018-08-16 MED ORDER — ENOXAPARIN SODIUM 40 MG/0.4ML ~~LOC~~ SOLN
40.0000 mg | SUBCUTANEOUS | Status: DC
  Administered 2018-08-16 – 2018-08-18 (×3): 40 mg via SUBCUTANEOUS
  Filled 2018-08-16 (×3): qty 0.4

## 2018-08-16 MED ORDER — PRO-STAT SUGAR FREE PO LIQD
30.0000 mL | Freq: Two times a day (BID) | ORAL | Status: DC
  Administered 2018-08-16 – 2018-08-18 (×4): 30 mL via ORAL
  Filled 2018-08-16 (×4): qty 30

## 2018-08-16 MED ORDER — TIMOLOL MALEATE 0.5 % OP SOLN
1.0000 [drp] | Freq: Two times a day (BID) | OPHTHALMIC | Status: DC
  Administered 2018-08-16 – 2018-08-17 (×2): 1 [drp] via OPHTHALMIC
  Filled 2018-08-16 (×2): qty 5

## 2018-08-16 MED ORDER — ONDANSETRON HCL 4 MG/2ML IJ SOLN: 4.0000 mg | Freq: Four times a day (QID) | INTRAMUSCULAR | Status: DC | PRN

## 2018-08-16 MED ORDER — BRINZOLAMIDE 1 % OP SUSP
1.0000 [drp] | Freq: Three times a day (TID) | OPHTHALMIC | Status: DC
  Administered 2018-08-16 – 2018-08-18 (×6): 1 [drp] via OPHTHALMIC
  Filled 2018-08-16 (×2): qty 10

## 2018-08-16 MED ORDER — ONDANSETRON HCL 4 MG PO TABS: 4.0000 mg | ORAL_TABLET | Freq: Four times a day (QID) | ORAL | Status: DC | PRN

## 2018-08-16 MED ORDER — SODIUM CHLORIDE 0.9% FLUSH
3.0000 mL | Freq: Two times a day (BID) | INTRAVENOUS | Status: DC
  Administered 2018-08-16 – 2018-08-17 (×3): 3 mL via INTRAVENOUS

## 2018-08-16 MED ORDER — LACTULOSE 10 GM/15ML PO SOLN
20.0000 g | Freq: Three times a day (TID) | ORAL | Status: DC
  Administered 2018-08-16 – 2018-08-18 (×6): 20 g via ORAL
  Filled 2018-08-16 (×7): qty 30

## 2018-08-16 MED ORDER — SODIUM CHLORIDE 0.9 % IV BOLUS
500.0000 mL | Freq: Once | INTRAVENOUS | Status: AC
  Administered 2018-08-16: 500 mL via INTRAVENOUS

## 2018-08-16 MED ORDER — ORAL CARE MOUTH RINSE
15.0000 mL | Freq: Two times a day (BID) | OROMUCOSAL | Status: DC
  Administered 2018-08-16 – 2018-08-18 (×4): 15 mL via OROMUCOSAL

## 2018-08-16 MED ORDER — LOSARTAN POTASSIUM 50 MG PO TABS
50.0000 mg | ORAL_TABLET | Freq: Every day | ORAL | Status: DC
  Administered 2018-08-16 – 2018-08-18 (×3): 50 mg via ORAL
  Filled 2018-08-16 (×3): qty 1

## 2018-08-16 MED ORDER — BRIMONIDINE TARTRATE 0.2 % OP SOLN
1.0000 [drp] | Freq: Two times a day (BID) | OPHTHALMIC | Status: DC
  Administered 2018-08-16 – 2018-08-18 (×4): 1 [drp] via OPHTHALMIC
  Filled 2018-08-16: qty 5

## 2018-08-16 MED ORDER — ENSURE ENLIVE PO LIQD
237.0000 mL | Freq: Two times a day (BID) | ORAL | Status: DC
  Administered 2018-08-17 – 2018-08-18 (×3): 237 mL via ORAL

## 2018-08-16 MED ORDER — ASPIRIN 81 MG PO CHEW
81.0000 mg | CHEWABLE_TABLET | Freq: Every day | ORAL | Status: DC
  Administered 2018-08-16 – 2018-08-18 (×3): 81 mg via ORAL
  Filled 2018-08-16 (×3): qty 1

## 2018-08-16 MED ORDER — ALBUTEROL SULFATE (2.5 MG/3ML) 0.083% IN NEBU: 2.5000 mg | INHALATION_SOLUTION | RESPIRATORY_TRACT | Status: DC | PRN

## 2018-08-16 MED ORDER — LATANOPROST 0.005 % OP SOLN
1.0000 [drp] | Freq: Every day | OPHTHALMIC | Status: DC
  Administered 2018-08-16 – 2018-08-17 (×2): 1 [drp] via OPHTHALMIC
  Filled 2018-08-16 (×2): qty 2.5

## 2018-08-16 MED ORDER — DIVALPROEX SODIUM 250 MG PO DR TAB
250.0000 mg | DELAYED_RELEASE_TABLET | Freq: Every day | ORAL | Status: DC
  Administered 2018-08-16 – 2018-08-17 (×2): 250 mg via ORAL
  Filled 2018-08-16 (×2): qty 1

## 2018-08-16 MED ORDER — INSULIN ASPART 100 UNIT/ML ~~LOC~~ SOLN: 0.0000 [IU] | Freq: Three times a day (TID) | SUBCUTANEOUS | Status: DC

## 2018-08-16 MED ORDER — SODIUM CHLORIDE 0.9 % IV SOLN
Freq: Once | INTRAVENOUS | Status: AC
  Administered 2018-08-16: 21:00:00 via INTRAVENOUS

## 2018-08-16 NOTE — Consult Note (Signed)
Cardiology Consultation:   Patient ID: Joshua PartridgeJohn H Gonzales MRN: [id number]; DOB: Jan 09, 1941  Admit date: 08/16/2018 Date of Consult: 08/16/2018  Primary Care Provider: Renford DillsPolite, [name], MD Primary Cardiologist: No primary care provider on file.  Primary Electrophysiologist:  None   Patient Profile:   Joshua PartridgeJohn H Gonzales is a 78 y.o. male with a hx of Alzheimers dementia, HTN, CVA, neurosyphilis, and seizure who is being seen today for the evaluation of bradycardia at the request of Dr. Katrinka BlazingSmith.  History of Present Illness:   Joshua Gonzales is a 78 yo male with the above PMH. [facility]. He has been having decreased mentation over the past 2 days. Notes indicate discussion with family noted he previously been at his baseline. Family was called 2 days prior to admission and instructed the patient had PNA and had decreased oral intake, as well as seemed to be more lethargic. No reported seizure activity prior to admission.   He was brought to the ED for further evaluation. There was noted to be afebrile, HR documented as ranging between the mid 30s-80s. VBG with pH 7.359, PCO2 50.8, PO2 126. Labs were within normal limits with exception of ammonia 37. UDS was + for opiates. CXR was negative. CT head showed chronic ischemic changes with remote bilateral infarcts. He was treated with IVFs in the ED. IM called for admission.   Prior EKG from 2016 showed SB with HR of 56. EKG this admission with SB rate 42 with TWI in v5-v6. Cardiology has been called in regards to his bradycardia.    Past Medical History:  Diagnosis Date  . Alzheimer disease   . Hypertension   . Seizures     No past surgical history on file.   Home Medications:  Prior to Admission medications   Medication Sig Start Date End Date Taking? Authorizing Provider  Amino Acids-Protein Hydrolys (FEEDING SUPPLEMENT, PRO-STAT SUGAR FREE 64,) LIQD Take 30 mLs by mouth 2 (two) times a day.   Yes [provider]  aspirin 81 MG chewable tablet Chew 81 mg by mouth daily.   Yes [provider]  bimatoprost (LUMIGAN) 0.01 % SOLN Place 1 drop into both eyes at bedtime.   Yes [provider]  brimonidine-timolol (COMBIGAN) 0.2-0.5 % ophthalmic solution Place 1 drop into both eyes every 12 (twelve) hours.   Yes [provider]  brinzolamide (AZOPT) 1 % ophthalmic suspension Place 1 drop into the left eye 3 (three) times daily.   Yes [provider]  divalproex (DEPAKOTE) 250 MG DR tablet Take 250 mg by mouth at bedtime.   Yes [provider]  HYDROcodone-acetaminophen (NORCO/VICODIN) 5-325 MG tablet Take 1 tablet by mouth 2 (two) times a day.   Yes [provider]  lactulose (CHRONULAC) 10 GM/15ML solution Take 20 g by mouth 3 (three) times daily.   Yes [provider]  levETIRAcetam (KEPPRA) 100 MG/ML solution Take 750 mg by mouth 2 (two) times daily.   Yes [provider]  losartan (COZAAR) 50 MG tablet Take 50 mg by mouth daily.   Yes [provider]  Multiple Vitamins-Minerals (CERTAGEN PO) Take 1 tablet by mouth daily.   Yes [provider]  Nutritional Supplements (RESOURCE 2.0 PO) Take 120 mLs by mouth 2 (two) times a day.   Yes [provider]  amLODipine (NORVASC) 5 MG tablet Take 2 tablets (10 mg total) by mouth daily. Patient not taking: Reported on 08/16/2018 09/10/13  Palumbo, April, MD  aspirin 325 MG tablet Take 1 tablet (325 mg total) by mouth daily. Patient not taking: Reported on 08/16/2018 05/16/14   Stark BrayMallory, Julia E, MD  levETIRAcetam (KEPPRA) 750 MG tablet Take 1 tablet (750 mg total) by mouth 2 (two) times daily. Patient not taking: Reported on 08/16/2018 06/04/14   Stark BrayMallory, Julia E, MD    Inpatient Medications: Scheduled Meds: . aspirin  81 mg Oral Daily  . brimonidine  1 drop Both Eyes Q12H   And  . timolol  1 drop Both Eyes BID  . brinzolamide  1 drop Left Eye TID  .  divalproex  250 mg Oral QHS  . enoxaparin (LOVENOX) injection  40 mg Subcutaneous Q24H  . feeding supplement (PRO-STAT SUGAR FREE 64)  30 mL Oral BID  . [START ON 08/17/2018] insulin aspart  0-9 Units Subcutaneous TID WC  . lactulose  20 g Oral TID  . latanoprost  1 drop Both Eyes QHS  . levETIRAcetam  750 mg Oral BID  . losartan  50 mg Oral Daily  . sodium chloride flush  3 mL Intravenous Q12H   Continuous Infusions:  PRN Meds: albuterol, ondansetron **OR** ondansetron (ZOFRAN) IV  Allergies:   No Known Allergies  Social History:   Social History   Socioeconomic History  . Marital status: Single    Spouse name: Not on file  . Number of children: Not on file  . Years of education: Not on file  . Highest education level: Not on file  Occupational History  . Not on file  Social Needs  . Financial resource strain: Not on file  . Food insecurity    Worry: Not on file    Inability: Not on file  . Transportation needs    Medical: Not on file    Non-medical: Not on file  Tobacco Use  . Smoking status: Former Smoker  Substance and Sexual Activity  . Alcohol use: Yes  . Drug use: Not on file  . Sexual activity: Not on file  Lifestyle  . Physical activity    Days per week: Not on file    Minutes per session: Not on file  . Stress: Not on file  Relationships  . Social Musicianconnections    Talks on phone: Not on file    Gets together: Not on file    Attends religious service: Not on file    Active member of club or organization: Not on file    Attends meetings of clubs or organizations: Not on file    Relationship status: Not on file  . Intimate partner violence    Fear of current or ex partner: Not on file    Emotionally abused: Not on file    Physically abused: Not on file    Forced sexual activity: Not on file  Other Topics Concern  . Not on file  Social History Narrative  . Not on file    Family History:    Family History  Problem Relation Age of Onset  .  Hyperlipidemia Mother   . Hypertension Mother   . Hypertension Father   . Hyperlipidemia Father      ROS:  Please see the history of present illness.   All other ROS reviewed and negative.     Physical Exam/Data:   Vitals:   08/16/18 1406 08/16/18 1415 08/16/18 1430 08/16/18 1445  BP:   (!) 173/105 (!) 165/105  Pulse: (!) 36     Resp: 15  16  17  Temp:      TempSrc:      SpO2: 96% 93%      Intake/Output Summary (Last 24 hours) at 08/16/2018 1801 Last data filed at 08/16/2018 1239 Gross per 24 hour  Intake 500 ml  Output -  Net 500 ml   Last 3 Weights 10/03/2016 06/04/2014 06/03/2014  Weight (lbs) 150 lb 150 lb 9.2 oz 152 lb 5.4 oz  Weight (kg) 68.04 kg 68.3 kg 69.1 kg     There is no height or weight on file to calculate BMI.  General:  Laying in bed, some lower extremity contractures, moans to touch, doesn't speak Neck: no JVD Vascular: No carotid bruits; FA pulses 2+ bilaterally without bruits  Cardiac:  normal S1, S2; RRR; no murmur  Lungs:  clear to auscultation bilaterally, no wheezing, rhonchi or rales  Abd: soft, nontender, no hepatomegaly  Ext: no edema, contractures  Musculoskeletal:  No deformities, BUE and BLE strength normal and equal Skin: warm and dry  Neuro:  CNs 2-12 intact, no focal abnormalities noted Psych:  Normal affect   EKG:  The EKG was personally reviewed and demonstrates:  Sinus bradycardia, PACs Telemetry:  No telemetry  Relevant CV Studies:  Laboratory Data:  Chemistry Recent Labs  Lab 08/16/18 1030 08/16/18 1117  NA 140 142  142  K 3.6 3.7  3.6  CL 105 106  CO2 27  --   GLUCOSE 173* 170*  BUN 14 15  CREATININE 0.86 0.70  CALCIUM 9.2  --   GFRNONAA >60  --   GFRAA >60  --   ANIONGAP 8  --     Recent Labs  Lab 08/16/18 1030  PROT 7.4  ALBUMIN 3.0*  AST 18  ALT 12  ALKPHOS 49  BILITOT 0.4   Hematology Recent Labs  Lab 08/16/18 1030 08/16/18 1117  WBC 5.6  --   RBC 4.71  --   HGB 14.6 15.3  15.6  HCT 46.3  45.0  46.0  MCV 98.3  --   MCH 31.0  --   MCHC 31.5  --   RDW 15.3  --   PLT 160  --    Cardiac Enzymes Recent Labs  Lab 08/16/18 1100  TROPONINI <0.03   No results for input(s): TROPIPOC in the last 168 hours.  BNPNo results for input(s): BNP, PROBNP in the last 168 hours.  DDimer No results for input(s): DDIMER in the last 168 hours.  Radiology/Studies:  Ct Head Wo Contrast  Result Date: 08/16/2018 CLINICAL DATA:  Altered mental status and decreased appetite over the past week. EXAM: CT HEAD WITHOUT CONTRAST TECHNIQUE: Contiguous axial images were obtained from the base of the skull through the vertex without intravenous contrast. COMPARISON:  Head CT scan 05/15/2014. FINDINGS: Brain: No evidence of acute infarction, hemorrhage, hydrocephalus, extra-axial collection or mass lesion/mass effect. Atrophy, chronic microvascular ischemic change and remote bilateral infarcts are again seen. Vascular: No evidence of acute infarction, hemorrhage, hydrocephalus, extra-axial collection or mass lesion/mass effect. Skull: Normal. Negative for fracture or focal lesion. Sinuses/Orbits: Negative. Other: None. IMPRESSION: No acute abnormality. Atrophy, chronic microvascular ischemic change, and remote bilateral infarcts. Electronically Signed   By: Inge Rise M.D.   On: 08/16/2018 12:04   Dg Chest Port 1 View  Result Date: 08/16/2018 CLINICAL DATA:  Altered mental status EXAM: PORTABLE CHEST 1 VIEW COMPARISON:  06/04/2014 FINDINGS: Cardiac shadow is within normal limits. Mild scarring is noted in the bases bilaterally stable from the previous exam.  No new focal infiltrate or effusion is seen. No bony abnormality is noted. IMPRESSION: Chronic changes without acute abnormality. Electronically Signed   By: Alcide CleverMark  Lukens M.D.   On: 08/16/2018 13:21    Assessment and Plan:   Joshua Gonzales is a 78 y.o. male with a hx of Alzheimers dementia, HTN, CVA, neurosyphilis, and seizure who is being seen today  for the evaluation of bradycardia at the request of Dr. Katrinka BlazingSmith.  1. Bradycardia: old EKG with HR noted in the 50s back in 2016. EKG this admission with rate 42 and 59 BPM, telemetry hasn't been started yet. The patient is coming from a nursing home, he is bed ridden with significant underlying dementia and very poor functional capacity. He would not be a candidate for a pacemaker placement. We will follow telemetry. Hemodynamically stable, actually hypertensive on admission. Unclear regarding his decreased mental state. CT head was negative, EEG ordered per primary and results are pending. No AVB agents noted on home med list. No acute indications for PPM at this time, though would not consider him a candidate.  --follow on telemetry  2. Acute encephalopathy: reported he is not at baseline, has been more lethargic over the past 2 days at the SNF. Sedating medications held by admitting.  -- CT head negative. EEG pending  3. HTN: blood pressures are elevated on admission. Home medications are continue via primary   4. Prior CVA: on ASA 81mg  prior to admission.  For questions or updates, please contact [facility] Please consult [email or web address] for contact info under   Signed, Tobias AlexanderKatarina Lynze Reddy, MD 08/16/2018 6:01 PM

## 2018-08-16 NOTE — H&P (Addendum)
History and Physical    Joshua Gonzales GYI:[id number] DOB: September 17, 1940 DOA: 08/16/2018  Referring MD/NP/PA: Sherwood Gambler, MD PCP: Seward Carol, MD  Patient coming from: [facility] facility via EMS  Chief Complaint: Altered  I have personally briefly reviewed patient's old medical records in [place]   HPI: Joshua Gonzales is a 77 y.o. male with medical history significant of Alzheimer's dementia, hypertension, CVA with residual deficits, neurosyphilis, and seizure disorder; who presented for decrease mentation from nursing facility via EMS.  Patient unable to give much history due to dementia.  After discussions with his niece over the phone she notes that at baseline patient needs assistance with feeding and is bedbound.  Family had been called last day or so and told that the patient had pneumonia and were starting him on antibiotics.  Apparently, he had not been eating much and been more sleepy than usual this week.  His niece notes that he becomes lethargic like this from time to time and not quite sure why.   ED Course: Upon admission into the emergency department patient was seen to be afebrile, pulse 36-87, respirations 12-31, blood pressure 189/91, and O2 saturation maintained on room air.  Venous blood gas noted pH 7.359, PCO2 50.8, PO2 126.  Labs revealed he within normal limits, ammonia level 37, glucose 173, and all other labs.  UDS positive for opiates.  Chest x-ray showed no acute changes.  CT scan of the head revealed only chronic microvascular ischemic changes with remote bilateral infarcts.  Patient was given 500 mL normal saline IV fluids [facility] called to admit.  Review of Systems  Unable to perform ROS: Dementia    Past Medical History:  Diagnosis Date  . Alzheimer disease   . Hypertension   . Seizures     Surgical history includes: Remote growth removed head   reports that he has quit smoking. He does not have any smokeless tobacco history on file.  He reports current alcohol use. No history on file for drug.  No Known Allergies  Family History  Problem Relation Age of Onset  . Hyperlipidemia Mother   . Hypertension Mother   . Hypertension Father   . Hyperlipidemia Father     Prior to Admission medications   Medication Sig Start Date End Date Taking? Authorizing Provider  Amino Acids-Protein Hydrolys (FEEDING SUPPLEMENT, PRO-STAT SUGAR FREE 64,) LIQD Take 30 mLs by mouth 2 (two) times a day.   Yes [provider]  aspirin 81 MG chewable tablet Chew 81 mg by mouth daily.   Yes [provider]  bimatoprost (LUMIGAN) 0.01 % SOLN Place 1 drop into both eyes at bedtime.   Yes [provider]  brimonidine-timolol (COMBIGAN) 0.2-0.5 % ophthalmic solution Place 1 drop into both eyes every 12 (twelve) hours.   Yes [provider]  brinzolamide (AZOPT) 1 % ophthalmic suspension Place 1 drop into the left eye 3 (three) times daily.   Yes [provider]  divalproex (DEPAKOTE) 250 MG DR tablet Take 250 mg by mouth at bedtime.   Yes [provider]  HYDROcodone-acetaminophen (NORCO/VICODIN) 5-325 MG tablet Take 1 tablet by mouth 2 (two) times a day.   Yes [provider]  lactulose (CHRONULAC) 10 GM/15ML solution Take 20 g by mouth 3 (three) times daily.   Yes [provider]  levETIRAcetam (KEPPRA) 100 MG/ML solution Take 750 mg by mouth 2 (two) times daily.   Yes [provider]  losartan (COZAAR) 50  MG tablet Take 50 mg by mouth daily.   Yes [provider]  Multiple Vitamins-Minerals (CERTAGEN PO) Take 1 tablet by mouth daily.   Yes [provider]  Nutritional Supplements (RESOURCE 2.0 PO) Take 120 mLs by mouth 2 (two) times a day.   Yes [provider]  amLODipine (NORVASC) 5 MG tablet Take 2 tablets (10 mg total) by mouth daily. Patient not taking: Reported on 08/16/2018 09/10/13   Palumbo, April, MD  aspirin 325 MG tablet Take 1 tablet  (325 mg total) by mouth daily. Patient not taking: Reported on 08/16/2018 05/16/14   Stark BrayMallory, Julia E, MD  levETIRAcetam (KEPPRA) 750 MG tablet Take 1 tablet (750 mg total) by mouth 2 (two) times daily. Patient not taking: Reported on 08/16/2018 06/04/14   Stark BrayMallory, Julia E, MD    Physical Exam:  Constitutional:Elderly man who is currently sleeping, but easily arousable Vitals:   08/16/18 1406 08/16/18 1415 08/16/18 1430 08/16/18 1445  BP:   (!) 173/105 (!) 165/105  Pulse: (!) 36     Resp: 15  16 17   Temp:      TempSrc:      SpO2: 96% 93%     Eyes: PERRL, lids and conjunctivae normal ENMT: Mucous membranes are dry. Posterior pharynx clear of any exudate or lesions.  Neck: normal, supple, no masses, no thyromegaly Respiratory: clear to auscultation bilaterally, no wheezing, no crackles. Normal respiratory effort. No accessory muscle use.  Cardiovascular: Bradycardic, no murmurs / rubs / gallops. No extremity edema. 2+ pedal pulses. No carotid bruits.  Abdomen: no tenderness, no masses palpated. No hepatosplenomegaly. Bowel sounds positive.  Musculoskeletal: no clubbing / cyanosis.  Contractures noted of the bilateral lower extremities with muscle wasting present Skin: no rashes, lesions, ulcers. No induration Neurologic: CN 2-12 grossly intact. Psychiatric: Lethargic and oriented to self.    Labs on Admission: I have personally reviewed following labs and imaging studies  CBC: Recent Labs  Lab 08/16/18 1030 08/16/18 1117  WBC 5.6  --   NEUTROABS 2.3  --   HGB 14.6 15.3  15.6  HCT 46.3 45.0  46.0  MCV 98.3  --   PLT 160  --    Basic Metabolic Panel: Recent Labs  Lab 08/16/18 1030 08/16/18 1117  NA 140 142  142  K 3.6 3.7  3.6  CL 105 106  CO2 27  --   GLUCOSE 173* 170*  BUN 14 15  CREATININE 0.86 0.70  CALCIUM 9.2  --    GFR: CrCl cannot be calculated (Unknown ideal weight.). Liver Function Tests: Recent Labs  Lab 08/16/18 1030  AST 18  ALT 12  ALKPHOS  49  BILITOT 0.4  PROT 7.4  ALBUMIN 3.0*   No results for input(s): LIPASE, AMYLASE in the last 168 hours. Recent Labs  Lab 08/16/18 1030  AMMONIA 37*   Coagulation Profile: Recent Labs  Lab 08/16/18 1030  INR 1.2   Cardiac Enzymes: Recent Labs  Lab 08/16/18 1100  TROPONINI <0.03   BNP (last 3 results) No results for input(s): PROBNP in the last 8760 hours. HbA1C: No results for input(s): HGBA1C in the last 72 hours. CBG: Recent Labs  Lab 08/16/18 1057  GLUCAP 158*   Lipid Profile: No results for input(s): CHOL, HDL, LDLCALC, TRIG, CHOLHDL, LDLDIRECT in the last 72 hours. Thyroid Function Tests: No results for input(s): TSH, T4TOTAL, FREET4, T3FREE, THYROIDAB in the last 72 hours. Anemia Panel: No results for input(s): VITAMINB12, FOLATE, FERRITIN, TIBC, IRON, RETICCTPCT  in the last 72 hours. Urine analysis:    Component Value Date/Time   COLORURINE YELLOW 08/16/2018 1045   APPEARANCEUR CLEAR 08/16/2018 1045   LABSPEC 1.029 08/16/2018 1045   PHURINE 5.0 08/16/2018 1045   GLUCOSEU 50 (A) 08/16/2018 1045   HGBUR NEGATIVE 08/16/2018 1045   BILIRUBINUR NEGATIVE 08/16/2018 1045   KETONESUR NEGATIVE 08/16/2018 1045   PROTEINUR 30 (A) 08/16/2018 1045   UROBILINOGEN 0.2 06/03/2014 1649   NITRITE NEGATIVE 08/16/2018 1045   LEUKOCYTESUR NEGATIVE 08/16/2018 1045   Sepsis Labs: No results found for this or any previous visit (from the past 240 hour(s)).   Radiological Exams on Admission: Ct Head Wo Contrast  Result Date: 08/16/2018 CLINICAL DATA:  Altered mental status and decreased appetite over the past week. EXAM: CT HEAD WITHOUT CONTRAST TECHNIQUE: Contiguous axial images were obtained from the base of the skull through the vertex without intravenous contrast. COMPARISON:  Head CT scan 05/15/2014. FINDINGS: Brain: No evidence of acute infarction, hemorrhage, hydrocephalus, extra-axial collection or mass lesion/mass effect. Atrophy, chronic microvascular ischemic  change and remote bilateral infarcts are again seen. Vascular: No evidence of acute infarction, hemorrhage, hydrocephalus, extra-axial collection or mass lesion/mass effect. Skull: Normal. Negative for fracture or focal lesion. Sinuses/Orbits: Negative. Other: None. IMPRESSION: No acute abnormality. Atrophy, chronic microvascular ischemic change, and remote bilateral infarcts. Electronically Signed   By: Drusilla Kannerhomas  Dalessio M.D.   On: 08/16/2018 12:04   Dg Chest Port 1 View  Result Date: 08/16/2018 CLINICAL DATA:  Altered mental status EXAM: PORTABLE CHEST 1 VIEW COMPARISON:  06/04/2014 FINDINGS: Cardiac shadow is within normal limits. Mild scarring is noted in the bases bilaterally stable from the previous exam. No new focal infiltrate or effusion is seen. No bony abnormality is noted. IMPRESSION: Chronic changes without acute abnormality. Electronically Signed   By: Alcide CleverMark  Lukens M.D.   On: 08/16/2018 13:21    EKG: Independently reviewed.  Sinus rhythm at 59 bpm with new T wave inversionsin V5-6 from previous EKG   Assessment/Plan Acute encephalopathy, history of dementia: Patient has a history of dementia, but noted not to be at his baseline and more lethargic than usual.  Ammonia level only mildly elevated at 37 on admission.  UDS positive for opiates.  Chest x-ray.  Stable.  Given history differential includes polypharmacy vs. possibility of seizures vs. bradycardia. -Admit to a medical telemetry bed -Aspiration precautions -Neurochecks -Follow-up TSH -Check EEG -Normal saline at 75 mL/h x 1 L -Holding sedating medications  Bradycardia: Acute. Heart rates noted to be 36-87.  Blood pressures otherwise noted to be stable. -Follow-up telemetry overnight   -Cardiology consulted   Seizure disorder: No reported seizure activity. -Seizure precautions -Monitor for seizure activity -Check Depakote level -Continue Keppra and Depakote  Diabetes mellitus type 2: On admission blood glucose elevated  up to 170 on admission.  Last hemoglobin A1c noted to be 6.5 in 2016. -Hypoglycemic protocols -CBGs q. before meals with sensitive SSI  Essential hypertension: Blood pressures noted to be elevated up to 189/91. -Continue losartan -Hydralazine IV as needed  History of CVA with residual deficit  Hyperammonemia: Patient with previous history of alcohol abuse in remission.  Not known to have cirrhosis.  Ammonia level only mildly elevated at 37. -Continue lactulose     DVT prophylaxis: Lovenox Code Status: Full Family Communication: Discussed plan of care with patient's family over phone. Disposition Plan: Likely discharge back to skilled nursing facility once Consults called: none Admission status: Observation  Clydie Braunondell A  MD [facility]  Pager [phone]   If 7PM-7AM, please contact night-coverage [email or web address] Password [email or web address]  08/16/2018, 3:01 PM

## 2018-08-16 NOTE — ED Provider Notes (Signed)
[facility] EMERGENCY DEPARTMENT Provider Note   CSN: [id number] Arrival date & time: 08/16/18  1020   LEVEL 5 CAVEAT - ALTERED MENTAL STATUS   History   Chief Complaint Chief Complaint  Patient presents with  . Altered Mental Status    HPI JOWEL WALTNER is a 78 y.o. male.     HPI  78 year old male with a history of Alzheimer's presents from living facility with altered mental state.  Per report from EMS he has been more lethargic/altered and not eating and drinking for about a week.  EMS reports the facility tested him for coronavirus but the result has not come back.  The patient otherwise cannot contribute to history.  Past Medical History:  Diagnosis Date  . Alzheimer disease   . Hypertension   . Seizures     Patient Active Problem List   Diagnosis Date Noted  . Lactic acid increased   . Hypotension 06/03/2014  . Near syncope 06/03/2014  . AKI (acute kidney injury) ([place]) 06/03/2014  . Partial anterior cerebral circulation infarction ([place])   . Dementia with behavioral disturbance ([place])   . PAF (paroxysmal atrial fibrillation) ([place])   . History of seizures 05/15/2014  . Essential hypertension, benign 01/24/2013  . Unspecified late effects of cerebrovascular disease 01/24/2013  . Acute encephalopathy 11/26/2012  . Alcoholism in remission ([place]) 11/26/2012    No past surgical history on file.      Home Medications    Prior to Admission medications   Medication Sig Start Date End Date Taking? Authorizing Provider  Amino Acids-Protein Hydrolys (FEEDING SUPPLEMENT, PRO-STAT SUGAR FREE 64,) LIQD Take 30 mLs by mouth 2 (two) times a day.   Yes [provider]  aspirin 81 MG chewable tablet Chew 81 mg by mouth daily.   Yes [provider]  bimatoprost (LUMIGAN) 0.01 % SOLN Place 1 drop into both eyes at bedtime.   Yes [provider]  brimonidine-timolol (COMBIGAN) 0.2-0.5 % ophthalmic solution Place 1 drop into both  eyes every 12 (twelve) hours.   Yes [provider]  brinzolamide (AZOPT) 1 % ophthalmic suspension Place 1 drop into the left eye 3 (three) times daily.   Yes [provider]  divalproex (DEPAKOTE) 250 MG DR tablet Take 250 mg by mouth at bedtime.   Yes [provider]  HYDROcodone-acetaminophen (NORCO/VICODIN) 5-325 MG tablet Take 1 tablet by mouth 2 (two) times a day.   Yes [provider]  lactulose (CHRONULAC) 10 GM/15ML solution Take 20 g by mouth 3 (three) times daily.   Yes [provider]  levETIRAcetam (KEPPRA) 100 MG/ML solution Take 750 mg by mouth 2 (two) times daily.   Yes [provider]  losartan (COZAAR) 50 MG tablet Take 50 mg by mouth daily.   Yes [provider]  Multiple Vitamins-Minerals (CERTAGEN PO) Take 1 tablet by mouth daily.   Yes [provider]  Nutritional Supplements (RESOURCE 2.0 PO) Take 120 mLs by mouth 2 (two) times a day.   Yes [provider]  amLODipine (NORVASC) 5 MG tablet Take 2 tablets (10 mg total) by mouth daily. Patient not taking: Reported on 08/16/2018 09/10/13   Palumbo, April, MD  aspirin 325 MG tablet Take 1 tablet (325 mg total) by mouth daily. Patient not taking: Reported on 08/16/2018 05/16/14   Karlene Einstein, MD  levETIRAcetam (KEPPRA) 750 MG tablet Take 1 tablet (750 mg total) by mouth 2 (two) times daily. Patient not taking: Reported on  08/16/2018 06/04/14   Mallory, Thedora HindersJulia E, MD    Family History Family History  Problem Relation Age of Onset  . Hyperlipidemia Mother   . Hypertension Mother   . Hypertension Father   . Hyperlipidemia Father     Social History Social History   Tobacco Use  . Smoking status: Former Smoker  Substance Use Topics  . Alcohol use: Yes  . Drug use: Not on file     Allergies   Patient has no known allergies.   Review of Systems Review of Systems  Unable to perform ROS: Mental status change     Physical Exam Updated  Vital Signs BP (!) 173/105   Pulse (!) 36   Temp 97.6 F (36.4 C) (Rectal)   Resp 16   SpO2 93%   Physical Exam Vitals signs and nursing note reviewed.  Constitutional:      General: He is not in acute distress.    Appearance: He is well-developed. He is not ill-appearing or diaphoretic.  HENT:     Head: Normocephalic and atraumatic.     Right Ear: External ear normal.     Left Ear: External ear normal.     Nose: Nose normal.  Eyes:     General:        Right eye: No discharge.        Left eye: No discharge.     Pupils: Pupils are equal, round, and reactive to light.  Neck:     Musculoskeletal: Neck supple.  Cardiovascular:     Rate and Rhythm: Regular rhythm. Bradycardia present.     Heart sounds: Normal heart sounds.  Pulmonary:     Effort: Pulmonary effort is normal.     Breath sounds: Normal breath sounds.  Abdominal:     General: There is no distension.     Palpations: Abdomen is soft.     Tenderness: There is no abdominal tenderness.  Skin:    General: Skin is warm and dry.  Neurological:     Mental Status: He is lethargic.     Comments: Patient opens eyes to voice.  He weakly squeezes both hands on command but does not move his legs when commanded.  Quickly falls back asleep.  Psychiatric:        Mood and Affect: Mood is not anxious.      ED Treatments / Results  Labs (all labs ordered are listed, but only abnormal results are displayed) Labs Reviewed  COMPREHENSIVE METABOLIC PANEL - Abnormal; Notable for the following components:      Result Value   Glucose, Bld 173 (*)    Albumin 3.0 (*)    All other components within normal limits  URINALYSIS, COMPLETE (UACMP) WITH MICROSCOPIC - Abnormal; Notable for the following components:   Glucose, UA 50 (*)    Protein, ur 30 (*)    All other components within normal limits  AMMONIA - Abnormal; Notable for the following components:   Ammonia 37 (*)    All other components within normal limits  RAPID URINE  DRUG SCREEN, HOSP PERFORMED - Abnormal; Notable for the following components:   Opiates POSITIVE (*)    All other components within normal limits  VALPROIC ACID LEVEL - Abnormal; Notable for the following components:   Valproic Acid Lvl 24 (*)    All other components within normal limits  CBG MONITORING, ED - Abnormal; Notable for the following components:   Glucose-Capillary 158 (*)    All other components within normal  limits  I-STAT CHEM 8, ED - Abnormal; Notable for the following components:   Glucose, Bld 170 (*)    All other components within normal limits  POCT I-STAT EG7 - Abnormal; Notable for the following components:   pO2, Ven 126.0 (*)    Bicarbonate 28.6 (*)    All other components within normal limits  NOVEL CORONAVIRUS, NAA (HOSPITAL ORDER, SEND-OUT TO REF LAB)  CBC WITH DIFFERENTIAL/PLATELET  LACTIC ACID, PLASMA  PROTIME-INR  TROPONIN I  TSH  I-STAT VENOUS BLOOD GAS, ED    EKG EKG Interpretation  Date/Time:  Friday August 16 2018 10:30:34 EDT Ventricular Rate:  59 PR Interval:    QRS Duration: 104 QT Interval:  399 QTC Calculation: 396 R Axis:   -39 Text Interpretation:  Sinus rhythm Left axis deviation Anterior infarct, old Nonspecific T abnormalities, lateral leads nonspecific T waves V5-6 new since Mar 2016 Confirmed by Pricilla LovelessGoldston, Anallely Rosell [phone]([id number]) on 08/16/2018 10:59:33 AM   Radiology Ct Head Wo Contrast  Result Date: 08/16/2018 CLINICAL DATA:  Altered mental status and decreased appetite over the past week. EXAM: CT HEAD WITHOUT CONTRAST TECHNIQUE: Contiguous axial images were obtained from the base of the skull through the vertex without intravenous contrast. COMPARISON:  Head CT scan 05/15/2014. FINDINGS: Brain: No evidence of acute infarction, hemorrhage, hydrocephalus, extra-axial collection or mass lesion/mass effect. Atrophy, chronic microvascular ischemic change and remote bilateral infarcts are again seen. Vascular: No evidence of acute infarction,  hemorrhage, hydrocephalus, extra-axial collection or mass lesion/mass effect. Skull: Normal. Negative for fracture or focal lesion. Sinuses/Orbits: Negative. Other: None. IMPRESSION: No acute abnormality. Atrophy, chronic microvascular ischemic change, and remote bilateral infarcts. Electronically Signed   By: Drusilla Kannerhomas  Dalessio M.D.   On: 08/16/2018 12:04   Dg Chest Port 1 View  Result Date: 08/16/2018 CLINICAL DATA:  Altered mental status EXAM: PORTABLE CHEST 1 VIEW COMPARISON:  06/04/2014 FINDINGS: Cardiac shadow is within normal limits. Mild scarring is noted in the bases bilaterally stable from the previous exam. No new focal infiltrate or effusion is seen. No bony abnormality is noted. IMPRESSION: Chronic changes without acute abnormality. Electronically Signed   By: Alcide CleverMark  Lukens M.D.   On: 08/16/2018 13:21    Procedures Procedures (including critical care time)  Medications Ordered in ED Medications  sodium chloride 0.9 % bolus 500 mL (0 mLs Intravenous Stopped 08/16/18 1239)     Initial Impression / Assessment and Plan / ED Course  I have reviewed the triage vital signs and the nursing notes.  Pertinent labs & imaging results that were available during my care of the patient were reviewed by me and considered in my medical decision making (see chart for details).        Unclear cause of the patient's altered mental state.  I discussed with nurse at his nursing facility and he has not had anything to eat or taken his meds for the last 2 days.  He is on narcotics but I do not think this is a narcotic overdose given he easily awakens and does not have any myosis or respiratory depression.  Surprisingly his ammonia is not significantly elevated.  There is no obvious infectious cause.  I think you will need further work-up.  He is bradycardic though not hypotensive.  Will admit to the hospitalist service.  Final Clinical Impressions(s) / ED Diagnoses   Final diagnoses:  Altered mental  status, unspecified altered mental status type    ED Discharge Orders    None  Pricilla LovelessGoldston, Osha Rane, MD 08/16/18 [phone]

## 2018-08-16 NOTE — Progress Notes (Signed)
Per RN patient going to room and will call us when patient is moving - gave her EEG number.

## 2018-08-16 NOTE — ED Triage Notes (Signed)
Pt BIB [facility] from [facility] SNF d/t AMS & decrease appetite for x1 week.

## 2018-08-16 NOTE — ED Notes (Signed)
ED TO INPATIENT HANDOFF REPORT  ED Nurse Name and Phone #: Anabel Halon [phone]  S Name/Age/Gender Joshua Gonzales 78 y.o. male Room/Bed: 020C/020C  Code Status   Code Status: DNR  Home/SNF/Other Skilled nursing facility Patient oriented to: self Is this baseline? No   Triage Complete: Triage complete  Chief Complaint ALOC  Triage Note Pt BIB [facility] from [facility] SN[facility] d/t AMS & decrease appetite for x1 week.     Allergies No Known Allergies  Level of Care/Admitting Diagnosis ED Disposition    ED Disposition Condition Comment   Admit  Hospital Area: [place] [[id number]]  Level of Care: Telemetry Medical [104]  I expect the patient will be discharged within 24 hours: No (not a candidate for 5C-Observation unit)  Covid Evaluation: Screening Protocol (No Symptoms)  Diagnosis: Acute encephalopathy [659935]  Admitting Physician: Norval Morton [[id number]]  Attending Physician: Norval Morton [[id number]]  PT Class (Do Not Modify): Observation [104]  PT Acc Code (Do Not Modify): Observation [10022]       B Medical/Surgery History Past Medical History:  Diagnosis Date  . Alzheimer disease   . Hypertension   . Seizures    No past surgical history on file.   A IV Location/Drains/Wounds Patient Lines/Drains/Airways Status   Active Line/Drains/Airways    Name:   Placement date:   Placement time:   Site:   Days:   Peripheral IV 10/03/16 Left;Posterior Hand   10/03/16    1434    Hand   682   Peripheral IV 08/16/18 Right;Upper Arm   08/16/18    1204    Arm   less than 1          Intake/Output Last 24 hours  Intake/Output Summary (Last 24 hours) at 08/16/2018 1535 Last data filed at 08/16/2018 1239 Gross per 24 hour  Intake 500 ml  Output -  Net 500 ml    Labs/Imaging Results for orders placed or performed during the hospital encounter of 08/16/18 (from the past 48 hour(s))  Comprehensive metabolic panel     Status: Abnormal   Collection  Time: 08/16/18 10:30 AM  Result Value Ref Range   Sodium 140 135 - 145 mmol/L   Potassium 3.6 3.5 - 5.1 mmol/L   Chloride 105 98 - 111 mmol/L   CO2 27 22 - 32 mmol/L   Glucose, Bld 173 (H) 70 - 99 mg/dL   BUN 14 8 - 23 mg/dL   Creatinine, Ser 0.86 0.61 - 1.24 mg/dL   Calcium 9.2 8.9 - 10.3 mg/dL   Total Protein 7.4 6.5 - 8.1 g/dL   Albumin 3.0 (L) 3.5 - 5.0 g/dL   AST 18 15 - 41 U/L   ALT 12 0 - 44 U/L   Alkaline Phosphatase 49 38 - 126 U/L   Total Bilirubin 0.4 0.3 - 1.2 mg/dL   GFR calc non Af Amer >60 >60 mL/min   GFR calc Af Amer >60 >60 mL/min   Anion gap 8 5 - 15    Comment: Performed at [facility], [address], [place], [place] [address]  CBC WITH DIFFERENTIAL     Status: None   Collection Time: 08/16/18 10:30 AM  Result Value Ref Range   WBC 5.6 4.0 - 10.5 K/uL   RBC 4.71 4.22 - 5.81 MIL/uL   Hemoglobin 14.6 13.0 - 17.0 g/dL   HCT 46.3 39.0 - 52.0 %   MCV 98.3 80.0 - 100.0 fL  MCH 31.0 26.0 - 34.0 pg   MCHC 31.5 30.0 - 36.0 g/dL   RDW 40.915.3 81.111.5 - 91.415.5 %   Platelets 160 150 - 400 K/uL   nRBC 0.0 0.0 - 0.2 %   Neutrophils Relative % 41 %   Neutro Abs 2.3 1.7 - 7.7 K/uL   Lymphocytes Relative 44 %   Lymphs Abs 2.5 0.7 - 4.0 K/uL   Monocytes Relative 9 %   Monocytes Absolute 0.5 0.1 - 1.0 K/uL   Eosinophils Relative 5 %   Eosinophils Absolute 0.3 0.0 - 0.5 K/uL   Basophils Relative 1 %   Basophils Absolute 0.0 0.0 - 0.1 K/uL   Immature Granulocytes 0 %   Abs Immature Granulocytes 0.01 0.00 - 0.07 K/uL    Comment: Performed at [facility], [address] [place], [place] [address]  Ammonia     Status: Abnormal   Collection Time: 08/16/18 10:30 AM  Result Value Ref Range   Ammonia 37 (H) 9 - 35 umol/L    Comment: Performed at [facility], [address] [place], [place] [address]  Lactic acid, plasma     Status: None   Collection Time: 08/16/18 10:30 AM  Result Value Ref Range   Lactic Acid, Venous 1.8 0.5 - 1.9 mmol/L    Comment:  Performed at [facility], [address] [place], [place] [address]  Protime-INR     Status: None   Collection Time: 08/16/18 10:30 AM  Result Value Ref Range   Prothrombin Time 14.9 11.4 - 15.2 seconds   INR 1.2 0.8 - 1.2    Comment: (NOTE) INR goal varies based on device and disease states. Performed at [facility], [address] [place], [place] [address]   Valproic acid level     Status: Abnormal   Collection Time: 08/16/18 10:30 AM  Result Value Ref Range   Valproic Acid Lvl 24 (L) 50.0 - 100.0 ug/mL    Comment: Performed at [facility], [address] [place], [place] [address]  Urinalysis, Complete w Microscopic     Status: Abnormal   Collection Time: 08/16/18 10:45 AM  Result Value Ref Range   Color, Urine YELLOW YELLOW   APPearance CLEAR CLEAR   Specific Gravity, Urine 1.029 1.005 - 1.030   pH 5.0 5.0 - 8.0   Glucose, UA 50 (A) NEGATIVE mg/dL   Hgb urine dipstick NEGATIVE NEGATIVE   Bilirubin Urine NEGATIVE NEGATIVE   Ketones, ur NEGATIVE NEGATIVE mg/dL   Protein, ur 30 (A) NEGATIVE mg/dL   Nitrite NEGATIVE NEGATIVE   Leukocytes,Ua NEGATIVE NEGATIVE   RBC / HPF 0-5 0 - 5 RBC/hpf   WBC, UA 6-10 0 - 5 WBC/hpf   Bacteria, UA NONE SEEN NONE SEEN   Squamous Epithelial / LPF 0-5 0 - 5   Mucus PRESENT    Hyaline Casts, UA PRESENT     Comment: Performed at [facility], [address] [place], [place] [address]  Urine rapid drug screen (hosp performed)     Status: Abnormal   Collection Time: 08/16/18 10:45 AM  Result Value Ref Range   Opiates POSITIVE (A) NONE DETECTED   Cocaine NONE DETECTED NONE DETECTED   Benzodiazepines NONE DETECTED NONE DETECTED   Amphetamines NONE DETECTED NONE DETECTED   Tetrahydrocannabinol NONE DETECTED NONE DETECTED   Barbiturates NONE DETECTED NONE DETECTED    Comment: (NOTE) DRUG SCREEN FOR MEDICAL PURPOSES ONLY.  IF CONFIRMATION  IS NEEDED FOR ANY PURPOSE, NOTIFY LAB WITHIN 5 DAYS. LOWEST DETECTABLE  LIMITS FOR URINE DRUG SCREEN Drug Class                     Cutoff (ng/mL) Amphetamine and metabolites    1000 Barbiturate and metabolites    200 Benzodiazepine                 200 Tricyclics and metabolites     300 Opiates and metabolites        300 Cocaine and metabolites        300 THC                            50 Performed at [facility], [address] [place], [place] [address]   CBG monitoring, ED     Status: Abnormal   Collection Time: 08/16/18 10:57 AM  Result Value Ref Range   Glucose-Capillary 158 (H) 70 - 99 mg/dL   Comment 1 Notify RN    Comment 2 Document in Chart   Troponin I - Once     Status: None   Collection Time: 08/16/18 11:00 AM  Result Value Ref Range   Troponin I <0.03 <0.03 ng/mL    Comment: Performed at [facility], [address] [place], [place] [address]  I-stat chem 8, ED (not at [facility] or [facility])     Status: Abnormal   Collection Time: 08/16/18 11:17 AM  Result Value Ref Range   Sodium 142 135 - 145 mmol/L   Potassium 3.6 3.5 - 5.1 mmol/L   Chloride 106 98 - 111 mmol/L   BUN 15 8 - 23 mg/dL   Creatinine, Ser 1.470.70 0.61 - 1.24 mg/dL   Glucose, Bld 829170 (H) 70 - 99 mg/dL   Calcium, Ion 5.621.23 1.301.15 - 1.40 mmol/L   TCO2 29 22 - 32 mmol/L   Hemoglobin 15.6 13.0 - 17.0 g/dL   HCT 86.546.0 78.439.0 - 69.652.0 %  POCT I-Stat EG7     Status: Abnormal   Collection Time: 08/16/18 11:17 AM  Result Value Ref Range   pH, Ven 7.359 7.250 - 7.430   pCO2, Ven 50.8 44.0 - 60.0 mmHg   pO2, Ven 126.0 (H) 32.0 - 45.0 mmHg   Bicarbonate 28.6 (H) 20.0 - 28.0 mmol/L   TCO2 30 22 - 32 mmol/L   O2 Saturation 99.0 %   Acid-Base Excess 2.0 0.0 - 2.0 mmol/L   Sodium 142 135 - 145 mmol/L   Potassium 3.7 3.5 - 5.1 mmol/L   Calcium, Ion 1.23 1.15 - 1.40 mmol/L   HCT 45.0 39.0 - 52.0 %   Hemoglobin 15.3 13.0 - 17.0 g/dL   Patient temperature HIDE    Sample type VENOUS    Ct Head Wo Contrast  Result Date: 08/16/2018 CLINICAL DATA:  Altered mental status and decreased  appetite over the past week. EXAM: CT HEAD WITHOUT CONTRAST TECHNIQUE: Contiguous axial images were obtained from the base of the skull through the vertex without intravenous contrast. COMPARISON:  Head CT scan 05/15/2014. FINDINGS: Brain: No evidence of acute infarction, hemorrhage, hydrocephalus, extra-axial collection or mass lesion/mass effect. Atrophy, chronic microvascular ischemic change and remote bilateral infarcts are again seen. Vascular: No evidence of acute infarction, hemorrhage, hydrocephalus, extra-axial collection or mass lesion/mass effect. Skull: Normal. Negative for fracture or focal lesion. Sinuses/Orbits: Negative. Other: None. IMPRESSION: No acute abnormality. Atrophy, chronic microvascular ischemic  change, and remote bilateral infarcts. Electronically Signed   By: Drusilla Kannerhomas  Dalessio M.D.   On: 08/16/2018 12:04   Dg Chest Port 1 View  Result Date: 08/16/2018 CLINICAL DATA:  Altered mental status EXAM: PORTABLE CHEST 1 VIEW COMPARISON:  06/04/2014 FINDINGS: Cardiac shadow is within normal limits. Mild scarring is noted in the bases bilaterally stable from the previous exam. No new focal infiltrate or effusion is seen. No bony abnormality is noted. IMPRESSION: Chronic changes without acute abnormality. Electronically Signed   By: Alcide CleverMark  Lukens M.D.   On: 08/16/2018 13:21    Pending Labs Unresulted Labs (From admission, onward)    Start     Ordered   08/17/18 0500  CBC  Tomorrow morning,   R     08/16/18 1525   08/17/18 0500  Basic metabolic panel  Tomorrow morning,   R     08/16/18 1525   08/17/18 0500  Ammonia  Tomorrow morning,   R     08/16/18 1525   08/16/18 1525  Valproic acid level  Add-on,   AD     08/16/18 1525   08/16/18 1439  TSH  ONCE - STAT,   STAT     08/16/18 1438   08/16/18 1358  Novel Coronavirus,NAA,(SEND-OUT TO REF LAB - TAT 24-48 hrs); Hosp Order  (Asymptomatic Patients Labs)  Once,   STAT    Question:  Rule Out  Answer:  Yes   08/16/18 1357           Vitals/Pain Today's Vitals   08/16/18 1406 08/16/18 1415 08/16/18 1430 08/16/18 1445  BP:   (!) 173/105 (!) 165/105  Pulse: (!) 36     Resp: 15  16 17   Temp:      TempSrc:      SpO2: 96% 93%    PainSc:        Isolation Precautions No active isolations  Medications Medications  levETIRAcetam (KEPPRA) 100 MG/ML solution 750 mg (has no administration in time range)  divalproex (DEPAKOTE) DR tablet 250 mg (has no administration in time range)  lactulose (CHRONULAC) 10 GM/15ML solution 20 g (has no administration in time range)  losartan (COZAAR) tablet 50 mg (has no administration in time range)  latanoprost (XALATAN) 0.005 % ophthalmic solution 1 drop (has no administration in time range)  brinzolamide (AZOPT) 1 % ophthalmic suspension 1 drop (has no administration in time range)  brimonidine-timolol (COMBIGAN) 0.2-0.5 % ophthalmic solution 1 drop (has no administration in time range)  feeding supplement (PRO-STAT SUGAR FREE 64) liquid 30 mL (has no administration in time range)  aspirin chewable tablet 81 mg (has no administration in time range)  enoxaparin (LOVENOX) injection 40 mg (has no administration in time range)  sodium chloride flush (NS) 0.9 % injection 3 mL (has no administration in time range)  ondansetron (ZOFRAN) tablet 4 mg (has no administration in time range)    Or  ondansetron (ZOFRAN) injection 4 mg (has no administration in time range)  albuterol (PROVENTIL) (2.5 MG/3ML) 0.083% nebulizer solution 2.5 mg (has no administration in time range)  sodium chloride 0.9 % bolus 500 mL (0 mLs Intravenous Stopped 08/16/18 1239)    Mobility walks with person assist High fall risk   Focused Assessments Neuro Assessment Handoff:  Swallow screen pass? Yes          Neuro Assessment:   Neuro Checks:      Last Documented NIHSS Modified Score:   Has TPA been given? No If patient is a  Neuro Trauma and patient is going to OR before floor call report to 4N Charge  nurse: [phone] or [phone]     R Recommendations: See Admitting Provider Note  Report given to:   Additional Notes: n/a

## 2018-08-16 NOTE — Procedures (Signed)
History: 78 year old male being evaluated for lethargy  Sedation: None  Technique: This is a 21 channel routine scalp EEG performed at the bedside with bipolar and monopolar montages arranged in accordance to the international 10/20 system of electrode placement. One channel was dedicated to EKG recording.    Background: The background consists of intermixed alpha and beta activities. There is a well defined posterior dominant rhythm of 8 hz that attenuates with eye opening.  He has an increase in delta associated with drowsiness and sleep is recorded with normal appearing structures.   Photic stimulation: Physiologic driving is not performed  EEG Abnormalities: None  Clinical Interpretation: This normal EEG is recorded in the waking and sleep state. There was no seizure or seizure predisposition recorded on this study. Please note that lack of epileptiform activity on EEG does not preclude the possibility of epilepsy.   Roland Rack, MD [facility] [phone]  If 7pm- 7am, please page neurology on call as listed in [place].

## 2018-08-16 NOTE — Progress Notes (Signed)
EEG completed, results pending. 

## 2018-08-17 DIAGNOSIS — I10 Essential (primary) hypertension: Secondary | ICD-10-CM | POA: Diagnosis present

## 2018-08-17 DIAGNOSIS — G40909 Epilepsy, unspecified, not intractable, without status epilepticus: Secondary | ICD-10-CM | POA: Diagnosis present

## 2018-08-17 DIAGNOSIS — Z7982 Long term (current) use of aspirin: Secondary | ICD-10-CM | POA: Diagnosis not present

## 2018-08-17 DIAGNOSIS — G9340 Encephalopathy, unspecified: Secondary | ICD-10-CM | POA: Diagnosis not present

## 2018-08-17 DIAGNOSIS — Z79891 Long term (current) use of opiate analgesic: Secondary | ICD-10-CM | POA: Diagnosis not present

## 2018-08-17 DIAGNOSIS — E7220 Disorder of urea cycle metabolism, unspecified: Secondary | ICD-10-CM | POA: Diagnosis present

## 2018-08-17 DIAGNOSIS — I6930 Unspecified sequelae of cerebral infarction: Secondary | ICD-10-CM | POA: Diagnosis not present

## 2018-08-17 DIAGNOSIS — R4182 Altered mental status, unspecified: Secondary | ICD-10-CM | POA: Diagnosis present

## 2018-08-17 DIAGNOSIS — R627 Adult failure to thrive: Secondary | ICD-10-CM

## 2018-08-17 DIAGNOSIS — Z66 Do not resuscitate: Secondary | ICD-10-CM | POA: Diagnosis present

## 2018-08-17 DIAGNOSIS — T402X5A Adverse effect of other opioids, initial encounter: Secondary | ICD-10-CM | POA: Diagnosis present

## 2018-08-17 DIAGNOSIS — R001 Bradycardia, unspecified: Secondary | ICD-10-CM | POA: Diagnosis present

## 2018-08-17 DIAGNOSIS — G9341 Metabolic encephalopathy: Secondary | ICD-10-CM | POA: Diagnosis present

## 2018-08-17 DIAGNOSIS — F0151 Vascular dementia with behavioral disturbance: Secondary | ICD-10-CM | POA: Diagnosis not present

## 2018-08-17 DIAGNOSIS — Z7401 Bed confinement status: Secondary | ICD-10-CM | POA: Diagnosis not present

## 2018-08-17 DIAGNOSIS — Z87898 Personal history of other specified conditions: Secondary | ICD-10-CM | POA: Diagnosis not present

## 2018-08-17 DIAGNOSIS — G309 Alzheimer's disease, unspecified: Secondary | ICD-10-CM | POA: Diagnosis present

## 2018-08-17 DIAGNOSIS — E1165 Type 2 diabetes mellitus with hyperglycemia: Secondary | ICD-10-CM | POA: Diagnosis present

## 2018-08-17 DIAGNOSIS — E876 Hypokalemia: Secondary | ICD-10-CM | POA: Diagnosis present

## 2018-08-17 DIAGNOSIS — F1011 Alcohol abuse, in remission: Secondary | ICD-10-CM | POA: Diagnosis present

## 2018-08-17 DIAGNOSIS — Z515 Encounter for palliative care: Secondary | ICD-10-CM | POA: Diagnosis present

## 2018-08-17 DIAGNOSIS — F0281 Dementia in other diseases classified elsewhere with behavioral disturbance: Secondary | ICD-10-CM | POA: Diagnosis present

## 2018-08-17 DIAGNOSIS — Z1159 Encounter for screening for other viral diseases: Secondary | ICD-10-CM | POA: Diagnosis not present

## 2018-08-17 DIAGNOSIS — Z79899 Other long term (current) drug therapy: Secondary | ICD-10-CM | POA: Diagnosis not present

## 2018-08-17 DIAGNOSIS — Z87891 Personal history of nicotine dependence: Secondary | ICD-10-CM | POA: Diagnosis not present

## 2018-08-17 DIAGNOSIS — E860 Dehydration: Secondary | ICD-10-CM | POA: Diagnosis present

## 2018-08-17 LAB — CBC
HCT: 42.3 % (ref 39.0–52.0)
Hemoglobin: 13.6 g/dL (ref 13.0–17.0)
MCH: 31 pg (ref 26.0–34.0)
MCHC: 32.2 g/dL (ref 30.0–36.0)
MCV: 96.4 fL (ref 80.0–100.0)
Platelets: 156 10*3/uL (ref 150–400)
RBC: 4.39 MIL/uL (ref 4.22–5.81)
RDW: 14.6 % (ref 11.5–15.5)
WBC: 5 10*3/uL (ref 4.0–10.5)
nRBC: 0 % (ref 0.0–0.2)

## 2018-08-17 LAB — BASIC METABOLIC PANEL
Anion gap: 9 (ref 5–15)
BUN: 8 mg/dL (ref 8–23)
CO2: 24 mmol/L (ref 22–32)
Calcium: 9 mg/dL (ref 8.9–10.3)
Chloride: 107 mmol/L (ref 98–111)
Creatinine, Ser: 0.75 mg/dL (ref 0.61–1.24)
GFR calc Af Amer: >60 mL/min (ref >60)
GFR calc non Af Amer: >60 mL/min (ref >60)
Glucose, Bld: 84 mg/dL (ref 70–99)
Potassium: 3.3 mmol/L — ABNORMAL LOW (ref 3.5–5.1)
Sodium: 140 mmol/L (ref 135–145)

## 2018-08-17 LAB — GLUCOSE, CAPILLARY
Glucose-Capillary: 80 mg/dL (ref 70–99)
Glucose-Capillary: 72 mg/dL (ref 70–99)
Glucose-Capillary: 85 mg/dL (ref 70–99)
Glucose-Capillary: 93 mg/dL (ref 70–99)
Glucose-Capillary: 130 mg/dL — ABNORMAL HIGH (ref 70–99)

## 2018-08-17 LAB — NOVEL CORONAVIRUS, NAA (HOSP ORDER, SEND-OUT TO REF LAB; TAT 18-24 HRS): SARS-CoV-2, NAA: NOT DETECTED

## 2018-08-17 LAB — AMMONIA: Ammonia: 28 umol/L (ref 9–35)

## 2018-08-17 MED ORDER — KCL-LACTATED RINGERS-D5W 20 MEQ/L IV SOLN
INTRAVENOUS | Status: DC
  Administered 2018-08-17: 20:00:00 via INTRAVENOUS
  Filled 2018-08-17 (×2): qty 1000

## 2018-08-17 NOTE — Progress Notes (Signed)
Progress Note   Subjective   Pt has alzheimers and cannot provide history.  Inpatient Medications    Scheduled Meds: . aspirin  81 mg Oral Daily  . brimonidine  1 drop Both Eyes Q12H   And  . timolol  1 drop Both Eyes BID  . brinzolamide  1 drop Left Eye TID  . divalproex  250 mg Oral QHS  . enoxaparin (LOVENOX) injection  40 mg Subcutaneous Q24H  . feeding supplement (ENSURE ENLIVE)  237 mL Oral BID BM  . feeding supplement (PRO-STAT SUGAR FREE 64)  30 mL Oral BID  . insulin aspart  0-9 Units Subcutaneous TID WC  . lactulose  20 g Oral TID  . latanoprost  1 drop Both Eyes QHS  . levETIRAcetam  750 mg Oral BID  . losartan  50 mg Oral Daily  . mouth rinse  15 mL Mouth Rinse BID  . sodium chloride flush  3 mL Intravenous Q12H   Continuous Infusions:  PRN Meds: albuterol, ondansetron **OR** ondansetron (ZOFRAN) IV   Vital Signs    Vitals:   08/16/18 2303 08/17/18 0439 08/17/18 0825 08/17/18 0957  BP: (!) 153/100 (!) 156/84 (!) 149/83 (!) 150/98  Pulse: (!) 53 (!) 44 (!) 51 (!) 46  Resp: 15 18 18    Temp: 98.3 F (36.8 C) 98.1 F (36.7 C) 98.6 F (37 C)   TempSrc: Oral Oral Oral   SpO2: 97% 94% 96% 98%    Intake/Output Summary (Last 24 hours) at 08/17/2018 1051 Last data filed at 08/17/2018 0440 Gross per 24 hour  Intake 660 ml  Output 250 ml  Net 410 ml   There were no vitals filed for this visit.  Telemetry    Sinus bradycardia 40s-50s bpm,  No AV block or prolonged pauses, nonsustained idioventricular arrhrhythm observed x 1 - Personally Reviewed  Physical Exam    Limited exam performed due to social distancing and awaiting covid test results  GEN- The patient is chronically ill appearing,sleeping,  Head- normocephalic, atraumatic Eyes-  Sclera clear, conjunctiva pink Ears- hearing intact Oropharynx- clear Neck- supple, Lungs-  normal work of breathing   Labs    Chemistry Recent Labs  Lab 08/16/18 1030 08/16/18 1117  NA 140 142  142   K 3.6 3.7  3.6  CL 105 106  CO2 27  --   GLUCOSE 173* 170*  BUN 14 15  CREATININE 0.86 0.70  CALCIUM 9.2  --   PROT 7.4  --   ALBUMIN 3.0*  --   AST 18  --   ALT 12  --   ALKPHOS 49  --   BILITOT 0.4  --   GFRNONAA >60  --   GFRAA >60  --   ANIONGAP 8  --      Hematology Recent Labs  Lab 08/16/18 1030 08/16/18 1117  WBC 5.6  --   RBC 4.71  --   HGB 14.6 15.3  15.6  HCT 46.3 45.0  46.0  MCV 98.3  --   MCH 31.0  --   MCHC 31.5  --   RDW 15.3  --   PLT 160  --     Cardiac Enzymes Recent Labs  Lab 08/16/18 1100 08/16/18 1830  TROPONINI <0.03 <0.03   No results for input(s): TROPIPOC in the last 168 hours.      Assessment & Plan    1.  Sinus bradycardia Stable Stop timolol which could be contributing I agree with Dr Meda Coffee  that there is no indication for pacing.  Pacing would not likely impact his quality of life. Discontinue telemetry  2. HTN Stable No change required today  3. Prior stroke noted  DNR is appropriate for this patient.  Palliative/ comfort measures should be considered  Cardiology team to see as needed while here. Please call with questions.   Hillis RangeJames Ascencion Stegner MD, [facility]FACC 08/17/2018 10:51 AM

## 2018-08-17 NOTE — Progress Notes (Signed)
PROGRESS NOTE  Joshua Gonzales XTG:[id number] DOB: 22-Apr-1940 DOA: 08/16/2018 PCP: Seward Carol, MD  HPI/Recap of past 24 hours:  He denies pain, knows he is in [place], knows he is not at home but does not know exactly where he is at He knows his birthdate, he is not oriented to time He does attempt to follow commands but overall very weak, not able to lift arms, legs up  Assessment/Plan: Principal Problem:   Acute encephalopathy Active Problems:   History of seizures   Dementia with behavioral disturbance (HCC)   Type 2 diabetes mellitus without complication ([place])   History of CVA with residual deficit   Hyperammonemia (HCC)   Bradycardia  FTT: -patient is a long term SNF resident, baseline bedbound , needs to be fed, total care. -he is sent to the hospital due to poor oral intake, more lethargic than usual, per SNF he has not eat much x1 week -CT head no acute findings, EEG no seizure spikes, ua /cxr no acute infection, labs only significant for mild hypokalemia at 3.3 and mildly elevated ammonia at 37 -avoid sedative meds, he does not appear in pain, no agitation during exam today -suspect progressive dementia, will get speech eval, palliative care consult for goals of care, I have discussed with family , family agrees to talk with palliative care  Hypokalemia: Iv k supplement, inconsistent oral intake Check mag  Mildly elevated ammonia level - possible from depakote -he is on lactulose at home  Sinus bradycardia -currently does not appear symptomatic -tsh unremarkable -keep mag>2, k>4,  -cardiology consulted, not a candidate for aggressive cardiac intervention, not a candidate for pacemaker, cardiology recommended palliative care consult   D/c tele  H/o neurosyphilis finished treatment in 2014, in H/o CVA with residual deficits, h/o seizures, h/o vascular dementia On asa   Code Status: DNR, confirmed with family  Family Communication: patient , sister and  niece over the phone  Disposition Plan: needs palliative care input, snf with palliative care vs hospice pending improvement of oral intake   Consultants:  Palliative care  cardiology  Procedures:  none  Antibiotics:  none   Objective: BP (!) 150/82 (BP Location: Left Arm)   Pulse (!) 48   Temp 97.7 F (36.5 C) (Axillary)   Resp 18   SpO2 98%   Intake/Output Summary (Last 24 hours) at 08/17/2018 1744 Last data filed at 08/17/2018 1400 Gross per 24 hour  Intake 370 ml  Output 250 ml  Net 120 ml   There were no vitals filed for this visit.  Exam: Patient is examined daily including today on 08/17/2018, exams remain the same as of yesterday except that has changed    General:  Chronically ill , frail, somnolent, appear dehydrated   Cardiovascular: RRR  Respiratory: CTABL  Abdomen: Soft/ND/NT, positive BS  Musculoskeletal: No Edema, bilateral heal protactors   Neuro: somnolent, knows he is in [place], know his birthdate, does attempt to follow commands, but extremity weak not able to lift arms or legs against gravity , per family patient has been bedbound and is total care, not sure if this is baseline  Data Reviewed: Basic Metabolic Panel: Recent Labs  Lab 08/16/18 1030 08/16/18 1117 08/17/18 1226  NA 140 142  142 140  K 3.6 3.7  3.6 3.3*  CL 105 106 107  CO2 27  --  24  GLUCOSE 173* 170* 84  BUN 14 15 8   CREATININE 0.86 0.70 0.75  CALCIUM 9.2  --  9.0   Liver Function Tests: Recent Labs  Lab 08/16/18 1030  AST 18  ALT 12  ALKPHOS 49  BILITOT 0.4  PROT 7.4  ALBUMIN 3.0*   No results for input(s): LIPASE, AMYLASE in the last 168 hours. Recent Labs  Lab 08/16/18 1030 08/17/18 1226  AMMONIA 37* 28   CBC: Recent Labs  Lab 08/16/18 1030 08/16/18 1117 08/17/18 1226  WBC 5.6  --  5.0  NEUTROABS 2.3  --   --   HGB 14.6 15.3  15.6 13.6  HCT 46.3 45.0  46.0 42.3  MCV 98.3  --  96.4  PLT 160  --  156   Cardiac Enzymes:    Recent Labs  Lab 08/16/18 1100 08/16/18 1830  TROPONINI <0.03 <0.03   BNP (last 3 results) No results for input(s): BNP in the last 8760 hours.  ProBNP (last 3 results) No results for input(s): PROBNP in the last 8760 hours.  CBG: Recent Labs  Lab 08/16/18 2124 08/17/18 0626 08/17/18 1237 08/17/18 1249 08/17/18 1634  GLUCAP 94 80 85 72 93    Recent Results (from the past 240 hour(s))  Novel Coronavirus,NAA,(SEND-OUT TO REF LAB - TAT 24-48 hrs); Hosp Order     Status: None   Collection Time: 08/16/18  1:58 PM   Specimen: Nasopharyngeal Swab; Respiratory  Result Value Ref Range Status   SARS-CoV-2, NAA NOT DETECTED NOT DETECTED Final    Comment: (NOTE) This test was developed and its performance characteristics determined by [facility]. This test has not been FDA cleared or approved. This test has been authorized by FDA under an Emergency Use Authorization (EUA). This test is only authorized for the duration of time the declaration that circumstances exist justifying the authorization of the emergency use of in vitro diagnostic tests for detection of SARS-CoV-2 virus and/or diagnosis of COVID-19 infection under section 564(b)(1) of the Act, 21 U.S.C. 161WRU-0(A)(5360bbb-3(b)(1), unless the authorization is terminated or revoked sooner. When diagnostic testing is negative, the possibility of a false negative result should be considered in the context of a patient's recent exposures and the presence of clinical signs and symptoms consistent with COVID-19. An individual without symptoms of COVID-19 and who is not shedding SARS-CoV-2 virus would expect to have a negative (not detected) result in this assay. Performed  At: [facility] [address] [place], [place] [id number] Jolene SchimkeNagendra Sanjai MD NW:[id number]:[phone]    Coronavirus Source NASOPHARYNGEAL  Final    Comment: Performed at [facility], [address] [place], [place] [address]  MRSA PCR Screening      Status: None   Collection Time: 08/16/18  8:42 PM   Specimen: Nasal Mucosa; Nasopharyngeal  Result Value Ref Range Status   MRSA by PCR NEGATIVE NEGATIVE Final    Comment:        The GeneXpert MRSA Assay (FDA approved for NASAL specimens only), is one component of a comprehensive MRSA colonization surveillance program. It is not intended to diagnose MRSA infection nor to guide or monitor treatment for MRSA infections. Performed at [facility], [address] [place], [place] [address]      Studies: No results found.  Scheduled Meds: . aspirin  81 mg Oral Daily  . brimonidine  1 drop Both Eyes Q12H  . brinzolamide  1 drop Left Eye TID  . divalproex  250 mg Oral QHS  . enoxaparin (LOVENOX) injection  40 mg Subcutaneous Q24H  . feeding supplement (ENSURE ENLIVE)  237 mL Oral  BID BM  . feeding supplement (PRO-STAT SUGAR FREE 64)  30 mL Oral BID  . insulin aspart  0-9 Units Subcutaneous TID WC  . lactulose  20 g Oral TID  . latanoprost  1 drop Both Eyes QHS  . levETIRAcetam  750 mg Oral BID  . losartan  50 mg Oral Daily  . mouth rinse  15 mL Mouth Rinse BID  . sodium chloride flush  3 mL Intravenous Q12H    Continuous Infusions:   Time spent: 35mins I have personally reviewed and interpreted on  08/17/2018 daily labs,  imagings as discussed above under date review session and assessment and plans.  I reviewed all nursing notes, pharmacy notes, consultant notes,  vitals, pertinent old records  I have discussed plan of care as described above with RN , patient and family on 08/17/2018   Albertine GratesFang Mykenna Viele MD, PhD  [facility] Pager [phone]. If 7PM-7AM, please contact night-coverage at [email or web address], password [email or web address] 08/17/2018, 5:44 PM  LOS: 0 days

## 2018-08-17 NOTE — Progress Notes (Signed)
Initial Nutrition Assessment  DOCUMENTATION CODES:  Not applicable  INTERVENTION:  When able, please weigh patient. This will help with assessing how patient has done nutritionally over past several years.   Continue Ensure Enlive po BID, each supplement provides 350 kcal and 20 grams of protein  Continue 30 mL Prostat BID, each supplement provides 100 kcal and 15 grams of protein.   NUTRITION DIAGNOSIS:  Inadequate oral intake related to lethargy/confusion, chronic illness (dementia) as evidenced by SNFs report of pt eating poorly x1 week and nothing x2 days.   GOAL:  Patient will meet greater than or equal to 90% of their needs  MONITOR:  I & O's, Labs, Supplement acceptance, PO intake, Weight trends  REASON FOR ASSESSMENT:  Malnutrition Screening Tool    ASSESSMENT:  78 y/o male with pmhx alzheimers dementia, dm2, htn, cva w/ residual deficits, nuerosyphilis and seizure disorder. Pt is bedbound at baseline and requires assistance with feeding. Presented from SNF due to decreasing oral intake and increasing AMS and lethargy. No idenitifiable etiology found in ED. Admitted for further workup.     RD operating remotely d/t covid precautions. Pt with end-stage dementia and all information was obtained from chart. Prior to this admission, there hasnt been any chart history x2 years.   Per SNFs report, pt has not eaten anything over the last 2 days, but has had decreasing oral intake over the past week. No reported instances of GI upset.   Patient has not yet been weighed. Last weight in chart is from 2 years ago. Will request reweight.   Despite reportedly having poor intake PTA, pt appears to be eating reasonably well in hospital. Per meal records, he ate 45% of his lunch and 75% of his dinner. Will continue Ensure and prostat to supplement oral intake.   Important to highlight, despite being bedbound the pt has not developed any pressure wounds, which is a sign he has been  receiving adequate nutrition among other things.  Workup thus far has been unremarkable and pt is suspected to have had progression of his dementia. Family is planning to meet with palliative care regarding goals of care.   Labs: reviewed, unremarkable Meds: Ensure Enlive BID, SSI, Lactulose, IVF  Recent Labs  Lab 08/16/18 1030 08/16/18 1117 08/17/18 1226  NA 140 142  142 140  K 3.6 3.7  3.6 3.3*  CL 105 106 107  CO2 27  --  24  BUN 14 15 8   CREATININE 0.86 0.70 0.75  CALCIUM 9.2  --  9.0  GLUCOSE 173* 170* 84    NUTRITION - FOCUSED PHYSICAL EXAM: Unable to conduct  Diet Order:   Diet Order            Diet Heart Room service appropriate? No; Fluid consistency: Thin  Diet effective now             EDUCATION NEEDS:  No education needs have been identified at this time  Skin:  Skin Assessment: Reviewed RN Assessment  Last BM:  6/13  Height:  Ht Readings from Last 1 Encounters:  06/03/14 5\' 9"  (1.753 m)   Weight:  Wt Readings from Last 1 Encounters:  10/03/16 68 kg   Wt Readings from Last 10 Encounters:  10/03/16 68 kg  06/04/14 68.3 kg  05/15/14 79.4 kg  04/19/13 77.1 kg  11/23/12 69.2 kg   Ideal Body Weight:  72.73 kg  BMI:  There is no height or weight on file to calculate BMI.  Estimated  Nutritional Needs:  Kcal:  1500-1700 kcals Protein:  75-90g Pro Fluid:  1.5-1.7 L fluid  Christophe LouisNathan Jacson Rapaport RD, LDN, CNSC Clinical Nutrition Available Tues-Sat via Pager: [phone] 08/17/2018 7:34 PM

## 2018-08-18 DIAGNOSIS — Z7189 Other specified counseling: Secondary | ICD-10-CM

## 2018-08-18 DIAGNOSIS — Z515 Encounter for palliative care: Secondary | ICD-10-CM

## 2018-08-18 LAB — COMPREHENSIVE METABOLIC PANEL
ALT: 13 U/L (ref 0–44)
AST: 19 U/L (ref 15–41)
Albumin: 2.9 g/dL — ABNORMAL LOW (ref 3.5–5.0)
Alkaline Phosphatase: 48 U/L (ref 38–126)
Anion gap: 8 (ref 5–15)
BUN: 7 mg/dL — ABNORMAL LOW (ref 8–23)
CO2: 28 mmol/L (ref 22–32)
Calcium: 9.1 mg/dL (ref 8.9–10.3)
Chloride: 103 mmol/L (ref 98–111)
Creatinine, Ser: 0.72 mg/dL (ref 0.61–1.24)
GFR calc Af Amer: >60 mL/min (ref >60)
GFR calc non Af Amer: >60 mL/min (ref >60)
Glucose, Bld: 102 mg/dL — ABNORMAL HIGH (ref 70–99)
Potassium: 3.5 mmol/L (ref 3.5–5.1)
Sodium: 139 mmol/L (ref 135–145)
Total Bilirubin: 0.5 mg/dL (ref 0.3–1.2)
Total Protein: 7.1 g/dL (ref 6.5–8.1)

## 2018-08-18 LAB — GLUCOSE, CAPILLARY
Glucose-Capillary: 91 mg/dL (ref 70–99)
Glucose-Capillary: 114 mg/dL — ABNORMAL HIGH (ref 70–99)

## 2018-08-18 LAB — CBC WITH DIFFERENTIAL/PLATELET
Abs Immature Granulocytes: 0.01 10*3/uL (ref 0.00–0.07)
Basophils Absolute: 0 10*3/uL (ref 0.0–0.1)
Basophils Relative: 0 %
Eosinophils Absolute: 0.1 10*3/uL (ref 0.0–0.5)
Eosinophils Relative: 2 %
HCT: 42.4 % (ref 39.0–52.0)
Hemoglobin: 13.8 g/dL (ref 13.0–17.0)
Immature Granulocytes: 0 %
Lymphocytes Relative: 53 %
Lymphs Abs: 2.7 10*3/uL (ref 0.7–4.0)
MCH: 31.1 pg (ref 26.0–34.0)
MCHC: 32.5 g/dL (ref 30.0–36.0)
MCV: 95.5 fL (ref 80.0–100.0)
Monocytes Absolute: 0.5 10*3/uL (ref 0.1–1.0)
Monocytes Relative: 11 %
Neutro Abs: 1.8 10*3/uL (ref 1.7–7.7)
Neutrophils Relative %: 34 %
Platelets: 176 10*3/uL (ref 150–400)
RBC: 4.44 MIL/uL (ref 4.22–5.81)
RDW: 14.5 % (ref 11.5–15.5)
WBC: 5.1 10*3/uL (ref 4.0–10.5)
nRBC: 0 % (ref 0.0–0.2)

## 2018-08-18 LAB — AMMONIA: Ammonia: 62 umol/L — ABNORMAL HIGH (ref 9–35)

## 2018-08-18 LAB — MAGNESIUM: Magnesium: 1.9 mg/dL (ref 1.7–2.4)

## 2018-08-18 NOTE — Evaluation (Signed)
Clinical/Bedside Swallow Evaluation Patient Details  Name: Joshua Gonzales MRN: [id number] Date of Birth: 03-27-1940  Today's Date: 08/18/2018 Time: SLP Start Time (ACUTE ONLY): 0815 SLP Stop Time (ACUTE ONLY): 0850 SLP Time Calculation (min) (ACUTE ONLY): 35 min  Past Medical History:  Past Medical History:  Diagnosis Date  . Alzheimer disease   . Hypertension   . Seizures    Past Surgical History: No past surgical history on file. HPI:  Joshua Gonzales is a 77 y.o. male with medical history significant of Alzheimer's dementia, hypertension, CVA with residual deficits, neurosyphilis, and seizure disorder; who presented for decrease mentation from nursing facility via EMS.  Patient unable to give much history due to dementia.  After discussions with his niece over the phone she notes that at baseline patient needs assistance with feeding and is bedbound.  Family had been called last day or so and told that the patient had pneumonia and were starting him on antibiotics.  Apparently, he had not been eating much and been more sleepy than usual this week.  His niece notes that he becomes lethargic like this from time to time and not quite sure why.  CT of the head was showing no acture abnormality but atrophy, chronic microvascular ishcemic changes and remote bilateral infarcts.  Chest xray was showing chronic changes with no acute abnormality.   Assessment / Plan / Recommendation Clinical Impression  Clinical swallowing evaluation was completed using thin liquids via spoon, cup and straw, pureed material and regular solids.  The patient's breakfast tray was present and used for the evaluation.  During evaluation he consumed over half of his tray.    Of note, the patient was seen in [facility] at [facility] for a clinical swallowing evaluation with a recommendation for a regular diet and thin liquids.  ST follow up was not recommended.  Cranial nerve exam was completed this date and unremarkable.  Lingual,  labial, facial and jaw range of motion and strength appeared to be adequate.  The patient presented with an oral dysphagia characterized by delayed oral transit that was most likely impacted by his lack of dentition.  No oral residue was seen post swallow.  The patient was noted to cough x2 during evaluation given thin liquids, however, he also drank almost 3 ozs continuously of thin liquids via a straw without obvious issues.   Recommend that he continue on his current diet.  He will need full assistance for all intake.  ST will follow up for therapeutic diet tolerance and possible need for instrumental exam. MD please order MBS if the patient has a decline in his respiratory status.   SLP Visit Diagnosis: Dysphagia, oral phase (R13.11)    Aspiration Risk  Mild aspiration risk    Diet Recommendation   Regular with thin liquids.    Medication Administration: Whole meds with puree    Other  Recommendations Oral Care Recommendations: Oral care BID   Follow up Recommendations Other (comment)(TBD)      Frequency and Duration min 2x/week  2 weeks       Prognosis Barriers to Reach Goals: Cognitive deficits      Swallow Study   General Date of Onset: 08/16/18 HPI: Joshua Gonzales is a 78 y.o. male with medical history significant of Alzheimer's dementia, hypertension, CVA with residual deficits, neurosyphilis, and seizure disorder; who presented for decrease mentation from nursing facility via EMS.  Patient unable to give much history due to dementia.  After discussions with his niece  over the phone she notes that at baseline patient needs assistance with feeding and is bedbound.  Family had been called last day or so and told that the patient had pneumonia and were starting him on antibiotics.  Apparently, he had not been eating much and been more sleepy than usual this week.  His niece notes that he becomes lethargic like this from time to time and not quite sure why.  CT of the head was showing  no acture abnormality but atrophy, chronic microvascular ishcemic changes and remote bilateral infarcts.  Chest xray was showing chronic changes with no acute abnormality. Type of Study: Bedside Swallow Evaluation Previous Swallow Assessment: 11/2012 Rx Reg/thin and signed off.   Diet Prior to this Study: Regular Temperature Spikes Noted: No Respiratory Status: Room air History of Recent Intubation: No Behavior/Cognition: Alert;Cooperative Oral Cavity Assessment: Within Functional Limits Oral Care Completed by SLP: No Oral Cavity - Dentition: Poor condition;Missing dentition Vision: Functional for self-feeding Self-Feeding Abilities: Needs assist Patient Positioning: Upright in bed Baseline Vocal Quality: Normal;Low vocal intensity Volitional Swallow: Able to elicit    Oral/Motor/Sensory Function Overall Oral Motor/Sensory Function: Within functional limits   Ice Chips Ice chips: Not tested   Thin Liquid Thin Liquid: Impaired Presentation: Spoon;Cup;Straw;Self Fed Pharyngeal  Phase Impairments: Throat Clearing - Immediate(x2 over course of meal tray)    Nectar Thick Nectar Thick Liquid: Not tested   Honey Thick Honey Thick Liquid: Not tested   Puree Puree: Impaired Presentation: Spoon Oral Phase Impairments: Impaired mastication Oral Phase Functional Implications: Prolonged oral transit   Solid     Solid: Impaired Presentation: Spoon;Self Fed Oral Phase Impairments: Impaired mastication Oral Phase Functional Implications: Prolonged oral transit     Joshua AguasMelissa Terril Amaro, MA, CCC-SLP Acute Rehab SLP [phone]  Fleet ContrasMelissa N Brynn Gonzales 08/18/2018,9:00 AM

## 2018-08-18 NOTE — Discharge Summary (Signed)
Discharge Summary  ULICES MAACK ZOX:[id number] DOB: 01-20-1941  PCP: Seward Carol, MD  Admit date: 08/16/2018 Discharge date: 08/18/2018  Time spent: 39mins, more than 50% time spent on coordination of care.  Recommendations for Outpatient Follow-up:  1. F/u with SNF MD, SNF MD to set up hospice at [facility]. Hospice and palliative care liaison  Ms Margaretmary Eddy  is aware.  2. Speech to continue to follow patient for diet modification if needed   Discharge Diagnoses:  Active Hospital Problems   Diagnosis Date Noted   Acute encephalopathy 11/26/2012   Goals of care, counseling/discussion    Palliative care by specialist    FTT (failure to thrive) in adult    Type 2 diabetes mellitus without complication ([place]) 81/19/1478   History of CVA with residual deficit 08/16/2018   Hyperammonemia ([place]) 08/16/2018   Bradycardia 08/16/2018   Dementia with behavioral disturbance ([place])    History of seizures 05/15/2014   Hypokalemia 11/26/2012    Resolved Hospital Problems  No resolved problems to display.    Discharge Condition: stable  Diet recommendation: Speech to continue to follow patient for diet modification if needed, patient needs to be fed, he needs to be up at [place] with oral intake    History of present illness: ( per admitting MD Dr Tamala Julian)  PCP: Seward Carol, MD  Patient coming from: [facility] facility via EMS  Chief Complaint: Altered  I have personally briefly reviewed patient's old medical records in [place]   HPI: Joshua Gonzales is a 78 y.o. male with medical history significant of Alzheimer's dementia, hypertension, CVA with residual deficits, neurosyphilis, and seizure disorder; who presented for decrease mentation from nursing facility via EMS.  Patient unable to give much history due to dementia.  After discussions with his niece over the phone she notes that at baseline patient needs assistance with feeding and is  bedbound.  Family had been called last day or so and told that the patient had pneumonia and were starting him on antibiotics.  Apparently, he had not been eating much and been more sleepy than usual this week.  His niece notes that he becomes lethargic like this from time to time and not quite sure why.   ED Course: Upon admission into the emergency department patient was seen to be afebrile, pulse 36-87, respirations 12-31, blood pressure 189/91, and O2 saturation maintained on room air.  Venous blood gas noted pH 7.359, PCO2 50.8, PO2 126.  Labs revealed he within normal limits, ammonia level 37, glucose 173, and all other labs.  UDS positive for opiates.  Chest x-ray showed no acute changes.  CT scan of the head revealed only chronic microvascular ischemic changes with remote bilateral infarcts.  Patient was given 500 mL normal saline IV fluids [facility] called to admit.   Hospital Course:  Principal Problem:   Acute encephalopathy Active Problems:   Hypokalemia   History of seizures   Dementia with behavioral disturbance (HCC)   Type 2 diabetes mellitus without complication (HCC)   History of CVA with residual deficit   Hyperammonemia (HCC)   Bradycardia   FTT (failure to thrive) in adult   Goals of care, counseling/discussion   Palliative care by specialist   FTT: -patient is a long term SNF resident, baseline bedbound , needs to be fed, total care. -he is sent to the hospital due to poor oral intake, more lethargic than usual, per SNF he has not eat much x1 week,  nothing by mouth for 2days -CT head no acute findings, EEG no seizure spikes, ua /cxr no acute infection, labs only significant for mild hypokalemia at 3.3 and mildly elevated ammonia at 37 -avoid sedative meds, he does not appear in pain, no agitation during exam  -speech eval does has oral delay and is complicated by lack of teeth, but no frank aspiration,  -FTT likely due to  progressive dementia,  palliative care consult  for goals of care, family is agreeable to add on hospice service as additional layer of support to patient and avoid unnecessary hospitalizations -Hospice and palliative care liaison  Ms Haynes Bastracy Ennis  is aware. SNF MD to coordinate with hospice care.  Hypokalemia: likely due to inconsistent oral intake S/p Iv k supplement,Check mag 1.9  Mildly elevated ammonia level - possible from depakote, stop depakote , case discussed with neurology Dr Koren ShiverKirkpatck who agrees to stop depakote, as his home dose depakote at 250mg  qhs is not a seizure prevention dose, but it could cause elevated ammonia level -he is on lactulose at home   Sinus bradycardia -currently does not appear symptomatic -tsh unremarkable -keep mag>2, k>4,  -cardiology consulted, not a candidate for aggressive cardiac intervention, not a candidate for pacemaker, cardiology recommended palliative care consult  -family is aware patient is not a candidate for aggressive cardiac intervention   H/o neurosyphilis finished treatment in 2014, in H/o CVA with residual deficits, h/o seizures, h/o vascular dementia On asa   HTN: continue home meds lisinopril  Code Status: DNR, confirmed with family  Family Communication: patient , sister and niece over the phone  Disposition Plan:  snf with palliative care/ hospice, avoid rehospitalization    Consultants:  Palliative care  cardiology  Procedures:  none  Antibiotics:  none   Discharge Exam: BP (!) 154/80 (BP Location: Left Arm)    Pulse (!) 58    Temp 97.9 F (36.6 C) (Oral)    Resp 18    SpO2 96%   General: alert and interactive, he denies pain, reports he is at his sister's house, know his birthdate, not oriented to currently year, month. Cardiovascular: sinus bradycardia Respiratory: CTABL Extremity: no edema, generalized weakness  Discharge Instructions You were cared for by a hospitalist during your hospital stay. If you have any questions about your  discharge medications or the care you received while you were in the hospital after you are discharged, you can call the unit and asked to speak with the hospitalist on call if the hospitalist that took care of you is not available. Once you are discharged, your primary care physician will handle any further medical issues. Please note that NO REFILLS for any discharge medications will be authorized once you are discharged, as it is imperative that you return to your primary care physician (or establish a relationship with a primary care physician if you do not have one) for your aftercare needs so that they can reassess your need for medications and monitor your lab values.  Discharge Instructions    Diet general   Complete by: As directed    Aspiration precaution, speech to continue to follow for diet modification needs Need to be fed, sitting up at 90degree with oral intake   Increase activity slowly   Complete by: As directed      Allergies as of 08/18/2018   No Known Allergies     Medication List    STOP taking these medications   amLODipine 5 MG tablet Commonly known as:  NORVASC   divalproex 250 MG DR tablet Commonly known as: DEPAKOTE   HYDROcodone-acetaminophen 5-325 MG tablet Commonly known as: NORCO/VICODIN     TAKE these medications   aspirin 81 MG chewable tablet Chew 81 mg by mouth daily. What changed: Another medication with the same name was removed. Continue taking this medication, and follow the directions you see here.   bimatoprost 0.01 % Soln Commonly known as: LUMIGAN Place 1 drop into both eyes at bedtime.   brimonidine-timolol 0.2-0.5 % ophthalmic solution Commonly known as: COMBIGAN Place 1 drop into both eyes every 12 (twelve) hours.   brinzolamide 1 % ophthalmic suspension Commonly known as: AZOPT Place 1 drop into the left eye 3 (three) times daily.   CERTAGEN PO Take 1 tablet by mouth daily.   feeding supplement (PRO-STAT SUGAR FREE 64)  Liqd Take 30 mLs by mouth 2 (two) times a day.   lactulose 10 GM/15ML solution Commonly known as: CHRONULAC Take 20 g by mouth 3 (three) times daily.   levETIRAcetam 100 MG/ML solution Commonly known as: KEPPRA Take 750 mg by mouth 2 (two) times daily. What changed: Another medication with the same name was removed. Continue taking this medication, and follow the directions you see here.   losartan 50 MG tablet Commonly known as: COZAAR Take 50 mg by mouth daily.   RESOURCE 2.0 PO Take 120 mLs by mouth 2 (two) times a day.      No Known Allergies    The results of significant diagnostics from this hospitalization (including imaging, microbiology, ancillary and laboratory) are listed below for reference.    Significant Diagnostic Studies: Ct Head Wo Contrast  Result Date: 08/16/2018 CLINICAL DATA:  Altered mental status and decreased appetite over the past week. EXAM: CT HEAD WITHOUT CONTRAST TECHNIQUE: Contiguous axial images were obtained from the base of the skull through the vertex without intravenous contrast. COMPARISON:  Head CT scan 05/15/2014. FINDINGS: Brain: No evidence of acute infarction, hemorrhage, hydrocephalus, extra-axial collection or mass lesion/mass effect. Atrophy, chronic microvascular ischemic change and remote bilateral infarcts are again seen. Vascular: No evidence of acute infarction, hemorrhage, hydrocephalus, extra-axial collection or mass lesion/mass effect. Skull: Normal. Negative for fracture or focal lesion. Sinuses/Orbits: Negative. Other: None. IMPRESSION: No acute abnormality. Atrophy, chronic microvascular ischemic change, and remote bilateral infarcts. Electronically Signed   By: Drusilla Kannerhomas  Dalessio M.D.   On: 08/16/2018 12:04   Dg Chest Port 1 View  Result Date: 08/16/2018 CLINICAL DATA:  Altered mental status EXAM: PORTABLE CHEST 1 VIEW COMPARISON:  06/04/2014 FINDINGS: Cardiac shadow is within normal limits. Mild scarring is noted in the bases  bilaterally stable from the previous exam. No new focal infiltrate or effusion is seen. No bony abnormality is noted. IMPRESSION: Chronic changes without acute abnormality. Electronically Signed   By: Alcide CleverMark  Lukens M.D.   On: 08/16/2018 13:21    Microbiology: Recent Results (from the past 240 hour(s))  Novel Coronavirus,NAA,(SEND-OUT TO REF LAB - TAT 24-48 hrs); Hosp Order     Status: None   Collection Time: 08/16/18  1:58 PM   Specimen: Nasopharyngeal Swab; Respiratory  Result Value Ref Range Status   SARS-CoV-2, NAA NOT DETECTED NOT DETECTED Final    Comment: (NOTE) This test was developed and its performance characteristics determined by [facility]. This test has not been FDA cleared or approved. This test has been authorized by FDA under an Emergency Use Authorization (EUA). This test is only authorized for the duration of time the declaration that circumstances  exist justifying the authorization of the emergency use of in vitro diagnostic tests for detection of SARS-CoV-2 virus and/or diagnosis of COVID-19 infection under section 564(b)(1) of the Act, 21 U.S.C. 161WRU-0(A)(5360bbb-3(b)(1), unless the authorization is terminated or revoked sooner. When diagnostic testing is negative, the possibility of a false negative result should be considered in the context of a patient's recent exposures and the presence of clinical signs and symptoms consistent with COVID-19. An individual without symptoms of COVID-19 and who is not shedding SARS-CoV-2 virus would expect to have a negative (not detected) result in this assay. Performed  At: [facility] [address] [place], [place] [id number] Jolene SchimkeNagendra Sanjai MD NW:[id number]:[phone]    Coronavirus Source NASOPHARYNGEAL  Final    Comment: Performed at [facility], [address] [place], [place] [address]  MRSA PCR Screening     Status: None   Collection Time: 08/16/18  8:42 PM   Specimen: Nasal Mucosa; Nasopharyngeal  Result Value  Ref Range Status   MRSA by PCR NEGATIVE NEGATIVE Final    Comment:        The GeneXpert MRSA Assay (FDA approved for NASAL specimens only), is one component of a comprehensive MRSA colonization surveillance program. It is not intended to diagnose MRSA infection nor to guide or monitor treatment for MRSA infections. Performed at [facility], [address] [place], [place] [address]      Labs: Basic Metabolic Panel: Recent Labs  Lab 08/16/18 1030 08/16/18 1117 08/17/18 1226 08/18/18 0717  NA 140 142   142 140 139  K 3.6 3.7   3.6 3.3* 3.5  CL 105 106 107 103  CO2 27  --  24 28  GLUCOSE 173* 170* 84 102*  BUN 14 15 8  7*  CREATININE 0.86 0.70 0.75 0.72  CALCIUM 9.2  --  9.0 9.1  MG  --   --   --  1.9   Liver Function Tests: Recent Labs  Lab 08/16/18 1030 08/18/18 0717  AST 18 19  ALT 12 13  ALKPHOS 49 48  BILITOT 0.4 0.5  PROT 7.4 7.1  ALBUMIN 3.0* 2.9*   No results for input(s): LIPASE, AMYLASE in the last 168 hours. Recent Labs  Lab 08/16/18 1030 08/17/18 1226 08/18/18 0717  AMMONIA 37* 28 62*   CBC: Recent Labs  Lab 08/16/18 1030 08/16/18 1117 08/17/18 1226 08/18/18 0717  WBC 5.6  --  5.0 5.1  NEUTROABS 2.3  --   --  1.8  HGB 14.6 15.3   15.6 13.6 13.8  HCT 46.3 45.0   46.0 42.3 42.4  MCV 98.3  --  96.4 95.5  PLT 160  --  156 176   Cardiac Enzymes: Recent Labs  Lab 08/16/18 1100 08/16/18 1830  TROPONINI <0.03 <0.03   BNP: BNP (last 3 results) No results for input(s): BNP in the last 8760 hours.  ProBNP (last 3 results) No results for input(s): PROBNP in the last 8760 hours.  CBG: Recent Labs  Lab 08/17/18 1249 08/17/18 1634 08/17/18 2108 08/18/18 0635 08/18/18 1155  GLUCAP 72 93 130* 91 114*       Signed:  Albertine GratesFang Asianae Minkler MD, PhD  [facility] 08/18/2018, 1:35 PM

## 2018-08-18 NOTE — Consult Note (Addendum)
Consultation Note Date: 08/18/2018   Patient Name: Joshua Gonzales  DOB: 03-30-1940  MRN: [id number]  Age / Sex: 78 y.o., male  PCP: Renford DillsPolite, [name], MD Referring Physician: Albertine GratesXu, Fang, MD  Reason for Consultation: Establishing goals of care and Psychosocial/spiritual support  HPI/Patient Profile: 78 y.o. male  with past medical history of significant dementia, hypertension, CVA, history of neurosyphilis, seizure disorder admitted on 08/16/2018 with altered mental status from skilled nursing facility.  Patient had been exhibiting decreased oral intake as well as increased sleep.  Family had been told he had pneumonia and was started on antibiotics in the skilled nursing facility.. Consult ordered for goals of care  Clinical Assessment and Goals of Care: Patient seen, chart reviewed.  Patient is pleasantly confused but alert, in no acute distress.  Denies pain or shortness of breath.  He is oriented to himself but did not initially recognize that he was in the hospital.  When I prompted him that he was in the hospital and asked him why he came he stated "I remember not feeling well".  Upon admission patient was found to be bradycardic.  Heart rate has been averaging between 45 and 55 bpm and is low as 37 bpm.  [facility] did not reflect pneumonia.  He is eating only bites and sips.  He has been seen by SLP and found to have mild aspiration risk; recommendations for regular diet and thin liquids.  He does need significant support to eat, in terms of prompting.  When seen by cardiology, bradycardia was felt to be stable and there was no indication for pacing.  I did reach out to his niece, Joshua LowensteinWendy Gonzales who is listed as an emergency contact.  [name] does not have a designated healthcare power of attorney and unfortunately at this point because of his history of dementia he cannot  stipulate one.  He is 1 of 6 children and does have some siblings that are living but who are also in poor health.  Nieces and cousins are helping with decision-making and they are all working together as a family to assist [name].  Joshua FailWendy and I talked a lot about the disease of dementia and the disease trajectory with dementia specifically decrease in appetite, impaired swallow, risk for aspiration pneumonia, dysphasia; frailty, failure to thrive seen as we near end-of-life.  I also introduced the topic of hospice not only to support [name] but also family.  Niece is interested in this additional support for her uncle   SUMMARY OF RECOMMENDATIONS   Patient is DNR/DNI.  There is a golden form on the chart Placed case management consult for hospice referral. Offered choice per medicare guidelines: Family [facility] I will also email Joshua FailWendy some materials to help guide further family discussions addressing goals of care, Hard Choices for [employer] as well as MOST form Code Status/Advance Care Planning:  DNR    Palliative Prophylaxis:   Aspiration, Bowel Regimen, Delirium Protocol, Eye  Care, Frequent Pain Assessment, Oral Care and Turn Reposition    Psycho-social/Spiritual:   Desire for further Chaplaincy support:no  Additional Recommendations: Referral to Community Resources   Prognosis:   < 6 months in the setting of significant dementia, decreased p.o. intake, failure to thrive.  Patient is now bedbound and dependent in all ADLs  Discharge Planning: [place] with Hospice      Primary Diagnoses: Present on Admission: . Acute encephalopathy . Dementia with behavioral disturbance ([place]) . Hyperammonemia ([place]) . Bradycardia   I have reviewed the medical record, interviewed the patient and family, and examined the patient. The following aspects are pertinent.  Past Medical History:  Diagnosis Date  . Alzheimer  disease   . Hypertension   . Seizures    Social History   Socioeconomic History  . Marital status: Single    Spouse name: Not on file  . Number of children: Not on file  . Years of education: Not on file  . Highest education level: Not on file  Occupational History  . Not on file  Social Needs  . Financial resource strain: Not on file  . Food insecurity    Worry: Not on file    Inability: Not on file  . Transportation needs    Medical: Not on file    Non-medical: Not on file  Tobacco Use  . Smoking status: Former Smoker  Substance and Sexual Activity  . Alcohol use: Yes  . Drug use: Not on file  . Sexual activity: Not on file  Lifestyle  . Physical activity    Days per week: Not on file    Minutes per session: Not on file  . Stress: Not on file  Relationships  . Social Herbalist on phone: Not on file    Gets together: Not on file    Attends religious service: Not on file    Active member of club or organization: Not on file    Attends meetings of clubs or organizations: Not on file    Relationship status: Not on file  Other Topics Concern  . Not on file  Social History Narrative  . Not on file   Family History  Problem Relation Age of Onset  . Hyperlipidemia Mother   . Hypertension Mother   . Hypertension Father   . Hyperlipidemia Father    Scheduled Meds: . aspirin  81 mg Oral Daily  . brimonidine  1 drop Both Eyes Q12H  . brinzolamide  1 drop Left Eye TID  . divalproex  250 mg Oral QHS  . enoxaparin (LOVENOX) injection  40 mg Subcutaneous Q24H  . feeding supplement (ENSURE ENLIVE)  237 mL Oral BID BM  . feeding supplement (PRO-STAT SUGAR FREE 64)  30 mL Oral BID  . insulin aspart  0-9 Units Subcutaneous TID WC  . lactulose  20 g Oral TID  . latanoprost  1 drop Both Eyes QHS  . levETIRAcetam  750 mg Oral BID  . losartan  50 mg Oral Daily  . mouth rinse  15 mL Mouth Rinse BID  . sodium chloride flush  3 mL Intravenous Q12H   Continuous  Infusions: . dextrose 5% lactated ringers with KCl 20 mEq/L 75 mL/hr at 08/18/18 0400   PRN Meds:.albuterol, ondansetron **OR** ondansetron (ZOFRAN) IV Medications Prior to Admission:  Prior to Admission medications   Medication Sig Start Date End Date Taking? Authorizing Provider  Amino Acids-Protein Hydrolys (FEEDING SUPPLEMENT, PRO-STAT SUGAR FREE 64,)  LIQD Take 30 mLs by mouth 2 (two) times a day.   Yes [provider]  aspirin 81 MG chewable tablet Chew 81 mg by mouth daily.   Yes [provider]  bimatoprost (LUMIGAN) 0.01 % SOLN Place 1 drop into both eyes at bedtime.   Yes [provider]  brimonidine-timolol (COMBIGAN) 0.2-0.5 % ophthalmic solution Place 1 drop into both eyes every 12 (twelve) hours.   Yes [provider]  brinzolamide (AZOPT) 1 % ophthalmic suspension Place 1 drop into the left eye 3 (three) times daily.   Yes [provider]  divalproex (DEPAKOTE) 250 MG DR tablet Take 250 mg by mouth at bedtime.   Yes [provider]  HYDROcodone-acetaminophen (NORCO/VICODIN) 5-325 MG tablet Take 1 tablet by mouth 2 (two) times a day.   Yes [provider]  lactulose (CHRONULAC) 10 GM/15ML solution Take 20 g by mouth 3 (three) times daily.   Yes [provider]  levETIRAcetam (KEPPRA) 100 MG/ML solution Take 750 mg by mouth 2 (two) times daily.   Yes [provider]  losartan (COZAAR) 50 MG tablet Take 50 mg by mouth daily.   Yes [provider]  Multiple Vitamins-Minerals (CERTAGEN PO) Take 1 tablet by mouth daily.   Yes [provider]  Nutritional Supplements (RESOURCE 2.0 PO) Take 120 mLs by mouth 2 (two) times a day.   Yes [provider]  amLODipine (NORVASC) 5 MG tablet Take 2 tablets (10 mg total) by mouth daily. Patient not taking: Reported on 08/16/2018 09/10/13   Palumbo, April, MD  aspirin 325 MG tablet Take 1 tablet (325 mg total) by mouth daily. Patient not taking:  Reported on 08/16/2018 05/16/14   Stark BrayMallory, Julia E, MD  levETIRAcetam (KEPPRA) 750 MG tablet Take 1 tablet (750 mg total) by mouth 2 (two) times daily. Patient not taking: Reported on 08/16/2018 06/04/14   Stark BrayMallory, Julia E, MD   No Known Allergies Review of Systems  Unable to perform ROS: Dementia    Physical Exam Vitals signs and nursing note reviewed.  Constitutional:      Appearance: He is ill-appearing.  HENT:     Head: Normocephalic and atraumatic.  Cardiovascular:     Rate and Rhythm: Bradycardia present.  Pulmonary:     Effort: Pulmonary effort is normal.  Musculoskeletal: Normal range of motion.  Skin:    General: Skin is warm and dry.  Neurological:     Mental Status: He is alert.     Comments: Oriented to himself I asked him what brought him to the hospital he replied that he was not feeling well  Psychiatric:     Comments: Patient has significant short-term memory deficits.  There is no acute agitation, he appears calm otherwise unable to perform mental status exam     Vital Signs: BP (!) 167/99 (BP Location: Left Arm)   Pulse (!) 49   Temp 98.2 F (36.8 C) (Axillary)   Resp 16   SpO2 98%  Pain Scale: 0-10   Pain Score: 0-No pain   SpO2: SpO2: 98 % O2 Device:SpO2: 98 % O2 Flow Rate: .   IO: Intake/output summary:   Intake/Output Summary (Last 24 hours) at 08/18/2018 0948 Last data filed at 08/18/2018 0830 Gross per 24 hour  Intake 1017.18 ml  Output 300 ml  Net 717.18 ml    LBM: Last BM Date: 08/17/18 Baseline Weight:   Most recent weight:       Palliative Assessment/Data:   Flowsheet  Rows     Most Recent Value  Intake Tab  Referral Department  Hospitalist  Unit at Time of Referral  Med/Surg Unit  Palliative Care Primary Diagnosis  Cardiac  Date Notified  08/17/18  Palliative Care Type  New Palliative care  Reason for referral  Clarify Goals of Care  Date of Admission  08/16/18  Date first seen by Palliative Care  08/18/18  # of days  Palliative referral response time  1 Day(s)  # of days IP prior to Palliative referral  1  Clinical Assessment  Palliative Performance Scale Score  40%  Pain Max last 24 hours  Not able to report  Pain Min Last 24 hours  Not able to report  Dyspnea Max Last 24 Hours  Not able to report  Dyspnea Min Last 24 hours  Not able to report  Nausea Max Last 24 Hours  Not able to report  Nausea Min Last 24 Hours  Not able to report  Anxiety Max Last 24 Hours  Not able to report  Anxiety Min Last 24 Hours  Not able to report  Other Max Last 24 Hours  Not able to report  Psychosocial & Spiritual Assessment  Palliative Care Outcomes  Patient/Family meeting held?  Yes  Who was at the meeting?  pt, niece      Time In: 0900 Time Out: 1000 Time Total: 60 min Greater than 50%  of this time was spent counseling and coordinating care related to the above assessment and plan. Staffed with Dr. Roda ShuttersXu  Signed by: Irean HongSarah Grace Kellen Hover, NP   Please contact Palliative Medicine Team phone at [phone] for questions and concerns.  For individual provider: See Loretha StaplerAmion

## 2018-08-18 NOTE — TOC Initial Note (Signed)
Transition of Care [facility]) - Initial/Assessment Note    Patient Details  Name: Joshua Gonzales MRN: [id number] Date of Birth: 31-Jul-1940  [facility] of [facility]) CM/SW Contact:    Gelene Mink, [place] Phone Number: 08/18/2018, 1:10 PM  Clinical Narrative:             CSW called and spoke with the patient's niece, Dahlia Byes. CSW introduced herself and explained her role. CSW explained that her uncle would be discharging back to [facility] and hospice services will follow. CSW explained that the patient is medically ready to be discharged from the hospital. The patient's niece had a question about moving her uncle to [facility] instead of him going back to [facility]. CSW explained that the patient is at [facility] long term and medicaid is paying for his room and board. CSW explained that it is a harder process then just switching from one SNF to another. CSW stated that she can reach out to [facility] to see if they have any long term beds available. CSW explained that the social worker at [facility] can assist with facilitating the change if they family was adamant about it. CSW explained that the patient was still going to discharge from the hospital but she could start the conversation with [facility].   CSW informed Abigail Butts that she would call her once her uncle was about to be discharged from the hospital. She thanked the [place].         Expected Discharge Plan: Skilled Nursing Facility Barriers to Discharge: No Barriers Identified   Patient Goals and CMS Choice Patient states their goals for this hospitalization and ongoing recovery are:: Pt will return back to his long term care facility with hospice CMS Medicare.gov Compare Post Acute Care list provided to:: Other (Comment Required)(Pt will return back to LTC facility) Choice offered to / list presented to : NA  Expected Discharge Plan and Services Expected Discharge Plan: [place] In-house Referral:  Clinical Social Work Discharge Planning Services: Other - See comment([facility]) [place] Choice: [place] arrangements for the past 2 months: [place] Expected Discharge Date: 08/18/18                                    Prior Living Arrangements/Services Living arrangements for the past 2 months: [place] Lives with:: Facility Resident Patient language and need for interpreter reviewed:: No Do you feel safe going back to the place where you live?: Yes      Need for Family Participation in Patient Care: Yes (Comment) Care giver support system in place?: Yes (comment)   Criminal Activity/Legal Involvement Pertinent to Current Situation/Hospitalization: No - Comment as needed  Activities of Daily Living      Permission Sought/Granted Permission sought to share information with : Case Manager Permission granted to share information with : Yes, Verbal Permission Granted  Share Information with NAME: Abigail Butts  Permission granted to share info w AGENCY: [place] granted to share info w Relationship: Niece     Emotional Assessment Appearance:: Appears stated age Attitude/Demeanor/Rapport: Unable to Assess Affect (typically observed): Unable to Assess Orientation: : Oriented to Self, Oriented to Place Alcohol / Substance Use: Not Applicable Psych Involvement: No (comment)  Admission diagnosis:  Altered mental status, unspecified altered mental status type [R41.82] Patient Active Problem List  Diagnosis Date Noted  . Goals of care, counseling/discussion   . Palliative care by specialist   . FTT (failure to thrive) in adult   . Type 2 diabetes mellitus without complication (HCC) 08/16/2018  . History of CVA with residual deficit 08/16/2018  . Hyperammonemia (HCC) 08/16/2018  . Bradycardia 08/16/2018  . Lactic acid increased   . Hypotension 06/03/2014  . Near syncope  06/03/2014  . AKI (acute kidney injury) (HCC) 06/03/2014  . Partial anterior cerebral circulation infarction (HCC)   . Dementia with behavioral disturbance (HCC)   . PAF (paroxysmal atrial fibrillation) (HCC)   . History of seizures 05/15/2014  . Essential hypertension, benign 01/24/2013  . Unspecified late effects of cerebrovascular disease 01/24/2013  . Acute encephalopathy 11/26/2012  . Hypokalemia 11/26/2012  . Alcoholism in remission (HCC) 11/26/2012   PCP:  Renford DillsPolite, [name], MD Pharmacy:   [facility] [phone][id number] [place] - [phone] [address] [facility] [address] [place] [phone] Phone: [phone] Fax: [phone]     Social Determinants of Health (SDOH) Interventions    Readmission Risk Interventions No flowsheet data found.

## 2018-08-18 NOTE — TOC Transition Note (Signed)
Transition of Care [facility]) - CM/SW Discharge Note   Patient Details  Name: Joshua Gonzales MRN: [id number] Date of Birth: 08/12/1940  [facility]) CM/SW Contact:  Gelene Mink, [place] Phone Number: 08/18/2018, 3:13 PM   Clinical Narrative:     Patient will DC to: [place] Anticipated DC date: 08/18/2018 Family notified: Yes Transport by: Corey Harold   Per MD patient ready for DC to . RN, patient, patient's family, and facility notified of DC. Discharge Summary and FL2 sent to facility. RN to call report prior to discharge [phone]). The patient will report to room 108A.  DC packet on chart. Ambulance transport requested for patient.   CSW will sign off for now as social work intervention is no longer needed. Please consult [place] again if new needs arise.  Brailen Macneal, LCSW-A [place]/Clinical Social Work Department Cell: [phone]      Final next level of care: [place] Barriers to Discharge: No Barriers Identified   Patient Goals and CMS Choice Patient states their goals for this hospitalization and ongoing recovery are:: Pt will return to [facility] with Hospice CMS Medicare.gov Compare Post Acute Care list provided to:: Other (Comment Required)(pt to return to LTC facility) Choice offered to / list presented to : NA  Discharge Placement   Existing PASRR number confirmed : 08/18/18          Patient [facility] bed at: [facility] Patient to be transferred to facility by: [place] Name of family member notified: Abigail Butts Patient and family notified of of transfer: 08/18/18  Discharge Plan and Services In-house Referral: Clinical Social Work Discharge Planning Services: Other - See comment([facility] to follow at [facility]) Post Acute Care Choice: [place]          DME Arranged: N/A DME Agency: NA     Representative spoke with at DME Agency: NA HH Arranged: NA [place] Agency: NA        Social Determinants of Health  (SDOH) Interventions     Readmission Risk Interventions No flowsheet data found.

## 2018-08-18 NOTE — Progress Notes (Signed)
Patient discharged via ambulance transport to [facility].

## 2018-08-18 NOTE — TOC Transition Note (Signed)
Transition of Care [facility]) - CM/SW Discharge Note   Patient Details  Name: WOODLEY PETZOLD MRN: [id number] Date of Birth: Aug 27, 1940  Transition of Care [facility]) CM/SW Contact:  Carles Collet, RN Phone Number: 08/18/2018, 12:27 PM   Clinical Narrative:    Patient from [facility], [place] will arrange transfer back. Patient is long term care resident. Per [place]gina Organ she offered patient choice for hospice and called into referral to [facility]. Spoke w Linus Orn RN from [place] who states that both Clarise Cruz and Dr Erlinda Hong have called in referral to her this morning. Verifed that SNF is covered through [facility] and [place] services will be covered through Medicare. [facility] will see patient at [facility] tomorrow for admission assessment. No oxygen requirement at this time, no other immediate needs identified.  Will return to SNF as facilitated by CSW.     Final next level of care: Skilled Nursing Facility Barriers to Discharge: No Barriers Identified   Patient Goals and CMS Choice        Discharge Placement                       Discharge Plan and Services                                     Social Determinants of Health (SDOH) Interventions     Readmission Risk Interventions No flowsheet data found.

## 2018-08-18 NOTE — Progress Notes (Signed)
[facility] 3W 27 -- Manufacturing engineer [facility]) Hospice RN note  Notified by Dr. Erlinda Hong, and Carles Collet, RNCM of family request for [facility] services at [facility] after discharge. Chart and patient information under review with [facility] physician. Hospice eligibility pending at this time.   Spoke with Abigail Butts, niece, to initiate education related to hospice philosophy, services and team approach to care. She verbalized understanding of information given. Per discussion with Debbie and Dr. Erlinda Hong plan is for discharge to facility by [facility] today.  Please send completed and signed DNR form with patient.  Patient will need prescriptions for discharge comfort medications.  No DME needs at this time.  [facility] aware of the above. Completed discharge summary will need to be faxed to [facility] at [phone] when final. Please notify [place] when patient is ready to leave the unit at discharge by calling [phone]. Above information shared with Carles Collet, RNCM.  Please call with any hospice related questions or concerns.  Thank you, Margaretmary Eddy, RN, BSN [address]  [place] are on [place] listed as Hospice and [place]

## 2018-08-18 NOTE — [place] FL2 (Signed)
[place] MEDICAID FL2 LEVEL OF CARE SCREENING TOOL     IDENTIFICATION  Patient Name: [place] PartridgeJohn H [place] Birthdate: 01-10-1941 Sex: male Admission Date (Current Location): 08/16/2018  [place]  [place] [id number] O Facility and Address:  [facility] [place], [address], [place], [place] [address]      Provider Number: [id number]  Attending Physician Name and Address:  Albertine GratesXu, Fang, MD  Relative Name and Phone Number:  [facility] [facility], Niece, [phone]    Current Level of Care: Hospital Recommended Level of Care: Skilled Nursing Facility Prior Approval Number:    Date Approved/Denied:   PASRR Number: [id number]  Discharge Plan: SNF    Current Diagnoses: Patient Active Problem List   Diagnosis Date Noted  . Goals of care, counseling/discussion   . Palliative care by specialist   . FTT (failure to thrive) in adult   . Type 2 diabetes mellitus without complication (HCC) 08/16/2018  . History of CVA with residual deficit 08/16/2018  . Hyperammonemia (HCC) 08/16/2018  . Bradycardia 08/16/2018  . Lactic acid increased   . Hypotension 06/03/2014  . Near syncope 06/03/2014  . AKI (acute kidney injury) (HCC) 06/03/2014  . Partial anterior cerebral circulation infarction (HCC)   . Dementia with behavioral disturbance (HCC)   . PAF (paroxysmal atrial fibrillation) (HCC)   . History of seizures 05/15/2014  . Essential hypertension, benign 01/24/2013  . Unspecified late effects of cerebrovascular disease 01/24/2013  . Acute encephalopathy 11/26/2012  . Hypokalemia 11/26/2012  . Alcoholism in remission (HCC) 11/26/2012    Orientation RESPIRATION BLADDER Height & Weight     Self, Place  Normal Incontinent Weight:   Height:     BEHAVIORAL SYMPTOMS/MOOD NEUROLOGICAL BOWEL NUTRITION STATUS      Incontinent Diet(Heart Healthy, thin liquids)  AMBULATORY STATUS COMMUNICATION OF NEEDS Skin   Extensive Assist Verbally Normal(Dry Skin)                        Personal Care Assistance Level of Assistance  Bathing, Feeding, Dressing, Total care Bathing Assistance: Maximum assistance Feeding assistance: Maximum assistance Dressing Assistance: Maximum assistance Total Care Assistance: Maximum assistance   Functional Limitations Info  Sight, Hearing, Speech Sight Info: Adequate Hearing Info: Adequate Speech Info: Adequate    SPECIAL CARE FACTORS FREQUENCY  OT (By licensed OT), PT (By licensed PT), Speech therapy     PT Frequency: 5x/wk OT Frequency: 5x/wk     Speech Therapy Frequency: 2x/wk      Contractures Contractures Info: Not present    Additional Factors Info  Code Status, Allergies, Insulin Sliding Scale Code Status Info: DNR Allergies Info: No Known Allergies   Insulin Sliding Scale Info: insulin aspart novolog 0-9 units 3x daily w/meals       Current Medications (08/18/2018):  This is the current hospital active medication list Current Facility-Administered Medications  Medication Dose Route Frequency Provider Last Rate Last Dose  . albuterol (PROVENTIL) (2.5 MG/3ML) 0.083% nebulizer solution 2.5 mg  2.5 mg Nebulization Q4H PRN Smith, Rondell A, MD      . aspirin chewable tablet 81 mg  81 mg Oral Daily Smith, Rondell A, MD   81 mg at 08/18/18 1028  . brimonidine (ALPHAGAN) 0.2 % ophthalmic solution 1 drop  1 drop Both Eyes Q12H Smith, Rondell A, MD   1 drop at 08/18/18 1029  . brinzolamide (AZOPT) 1 % ophthalmic suspension 1 drop  1 drop Left Eye TID Clydie BraunSmith, Rondell A, MD  1 drop at 08/18/18 1029  . dextrose 5% in lactated ringers with KCl 20 mEq/L infusion   Intravenous Continuous Florencia Reasons, MD 75 mL/hr at 08/18/18 0400    . divalproex (DEPAKOTE) DR tablet 250 mg  250 mg Oral QHS Smith, Rondell A, MD   250 mg at 08/17/18 2215  . enoxaparin (LOVENOX) injection 40 mg  40 mg Subcutaneous Q24H Smith, Rondell A, MD   40 mg at 08/18/18 1028  . feeding supplement (ENSURE ENLIVE) (ENSURE ENLIVE) liquid 237 mL   237 mL Oral BID BM Smith, Rondell A, MD   237 mL at 08/18/18 1029  . feeding supplement (PRO-STAT SUGAR FREE 64) liquid 30 mL  30 mL Oral BID Tamala Julian, Rondell A, MD   30 mL at 08/18/18 1028  . insulin aspart (novoLOG) injection 0-9 Units  0-9 Units Subcutaneous TID WC Smith, Rondell A, MD      . lactulose (CHRONULAC) 10 GM/15ML solution 20 g  20 g Oral TID Fuller Plan A, MD   20 g at 08/18/18 1028  . latanoprost (XALATAN) 0.005 % ophthalmic solution 1 drop  1 drop Both Eyes QHS Tamala Julian, Rondell A, MD   1 drop at 08/17/18 2215  . levETIRAcetam (KEPPRA) 100 MG/ML solution 750 mg  750 mg Oral BID Tamala Julian, Rondell A, MD   750 mg at 08/18/18 1029  . losartan (COZAAR) tablet 50 mg  50 mg Oral Daily Smith, Rondell A, MD   50 mg at 08/18/18 1028  . MEDLINE mouth rinse  15 mL Mouth Rinse BID Tamala Julian, Rondell A, MD   15 mL at 08/18/18 1029  . ondansetron (ZOFRAN) tablet 4 mg  4 mg Oral Q6H PRN Fuller Plan A, MD       Or  . ondansetron (ZOFRAN) injection 4 mg  4 mg Intravenous Q6H PRN Smith, Rondell A, MD      . sodium chloride flush (NS) 0.9 % injection 3 mL  3 mL Intravenous Q12H Tamala Julian, Rondell A, MD   3 mL at 08/17/18 0950     Discharge Medications: Please see discharge summary for a list of discharge medications.  Relevant Imaging Results:  Relevant Lab Results:   Additional Information SSN: [id number]  [place], LCSWA

## 2018-08-18 NOTE — Progress Notes (Signed)
Report called to [facility], RN.  Patient ready for discharge this afternoon via ambulance transport.

## 2018-08-18 NOTE — Progress Notes (Addendum)
CSW reached out to [place] with [facility] for an update on the room number. CSW sent over the completed discharge summary, orders, and signed FL2. CSW is awaiting response that the facility is able to take the patient back today.   CSW will continue to follow and assist with discharge.   Domenic Schwab, MSW, [place]

## 2018-09-11 ENCOUNTER — Other Ambulatory Visit (HOSPITAL_COMMUNITY): Payer: Self-pay | Admitting: Internal Medicine

## 2018-09-11 DIAGNOSIS — N289 Disorder of kidney and ureter, unspecified: Secondary | ICD-10-CM

## 2018-09-11 DIAGNOSIS — Z789 Other specified health status: Secondary | ICD-10-CM

## 2018-09-17 ENCOUNTER — Other Ambulatory Visit: Payer: Self-pay

## 2018-09-17 ENCOUNTER — Ambulatory Visit (HOSPITAL_COMMUNITY)
Admission: RE | Admit: 2018-09-17 | Discharge: 2018-09-17 | Disposition: A | Discharge status: Home / Self Care | Payer: Medicare Other | Source: Ambulatory Visit | Attending: Internal Medicine | Admitting: Internal Medicine

## 2018-09-17 DIAGNOSIS — Z789 Other specified health status: Secondary | ICD-10-CM | POA: Insufficient documentation

## 2018-09-17 DIAGNOSIS — N289 Disorder of kidney and ureter, unspecified: Secondary | ICD-10-CM | POA: Diagnosis present

## 2018-09-24 ENCOUNTER — Other Ambulatory Visit (HOSPITAL_COMMUNITY): Payer: Self-pay | Admitting: Internal Medicine

## 2018-09-24 DIAGNOSIS — R1310 Dysphagia, unspecified: Secondary | ICD-10-CM

## 2018-09-27 ENCOUNTER — Other Ambulatory Visit: Payer: Self-pay | Admitting: Student

## 2018-09-27 ENCOUNTER — Other Ambulatory Visit (HOSPITAL_COMMUNITY): Payer: Medicare Other | Attending: Internal Medicine

## 2018-09-30 ENCOUNTER — Ambulatory Visit (HOSPITAL_COMMUNITY)
Admission: RE | Admit: 2018-09-30 | Discharge: 2018-09-30 | Disposition: A | Discharge status: Home / Self Care | Payer: Medicare Other | Source: Ambulatory Visit | Attending: Internal Medicine | Admitting: Internal Medicine

## 2018-09-30 ENCOUNTER — Other Ambulatory Visit: Payer: Self-pay

## 2018-09-30 ENCOUNTER — Encounter (HOSPITAL_COMMUNITY): Payer: Self-pay | Admitting: Diagnostic Radiology

## 2018-09-30 DIAGNOSIS — Z87891 Personal history of nicotine dependence: Secondary | ICD-10-CM | POA: Diagnosis not present

## 2018-09-30 DIAGNOSIS — F0280 Dementia in other diseases classified elsewhere without behavioral disturbance: Secondary | ICD-10-CM | POA: Insufficient documentation

## 2018-09-30 DIAGNOSIS — I10 Essential (primary) hypertension: Secondary | ICD-10-CM | POA: Insufficient documentation

## 2018-09-30 DIAGNOSIS — G309 Alzheimer's disease, unspecified: Secondary | ICD-10-CM | POA: Diagnosis not present

## 2018-09-30 DIAGNOSIS — R569 Unspecified convulsions: Secondary | ICD-10-CM | POA: Insufficient documentation

## 2018-09-30 DIAGNOSIS — Z79899 Other long term (current) drug therapy: Secondary | ICD-10-CM | POA: Diagnosis not present

## 2018-09-30 DIAGNOSIS — A523 Neurosyphilis, unspecified: Secondary | ICD-10-CM | POA: Insufficient documentation

## 2018-09-30 DIAGNOSIS — R1310 Dysphagia, unspecified: Secondary | ICD-10-CM | POA: Diagnosis present

## 2018-09-30 DIAGNOSIS — Z8673 Personal history of transient ischemic attack (TIA), and cerebral infarction without residual deficits: Secondary | ICD-10-CM | POA: Insufficient documentation

## 2018-09-30 HISTORY — PX: IR GASTROSTOMY TUBE MOD SED: IMG625

## 2018-09-30 LAB — CBC
HCT: 40.7 % (ref 39.0–52.0)
Hemoglobin: 13.2 g/dL (ref 13.0–17.0)
MCH: 31.1 pg (ref 26.0–34.0)
MCHC: 32.4 g/dL (ref 30.0–36.0)
MCV: 95.8 fL (ref 80.0–100.0)
Platelets: 197 10*3/uL (ref 150–400)
RBC: 4.25 MIL/uL (ref 4.22–5.81)
RDW: 14.9 % (ref 11.5–15.5)
WBC: 5.4 10*3/uL (ref 4.0–10.5)
nRBC: 0 % (ref 0.0–0.2)

## 2018-09-30 LAB — PROTIME-INR
INR: 1.1 (ref 0.8–1.2)
Prothrombin Time: 14.2 seconds (ref 11.4–15.2)

## 2018-09-30 MED ORDER — CEFAZOLIN SODIUM-DEXTROSE 2-4 GM/100ML-% IV SOLN: 2.0000 g | Freq: Once | INTRAVENOUS | Status: DC

## 2018-09-30 MED ORDER — FENTANYL CITRATE (PF) 100 MCG/2ML IJ SOLN
INTRAMUSCULAR | Status: AC | PRN
  Administered 2018-09-30: 50 ug via INTRAVENOUS

## 2018-09-30 MED ORDER — HYDRALAZINE HCL 20 MG/ML IJ SOLN
INTRAMUSCULAR | Status: AC
  Filled 2018-09-30: qty 1

## 2018-09-30 MED ORDER — MIDAZOLAM HCL 2 MG/2ML IJ SOLN
INTRAMUSCULAR | Status: AC | PRN
  Administered 2018-09-30 (×2): 1 mg via INTRAVENOUS

## 2018-09-30 MED ORDER — GLUCAGON HCL RDNA (DIAGNOSTIC) 1 MG IJ SOLR
INTRAMUSCULAR | Status: AC
  Filled 2018-09-30: qty 1

## 2018-09-30 MED ORDER — CEFAZOLIN SODIUM-DEXTROSE 2-4 GM/100ML-% IV SOLN
INTRAVENOUS | Status: AC
  Filled 2018-09-30: qty 100

## 2018-09-30 MED ORDER — SODIUM CHLORIDE 0.9 % IV SOLN: INTRAVENOUS | Status: DC

## 2018-09-30 MED ORDER — IOHEXOL 300 MG/ML  SOLN
50.0000 mL | Freq: Once | INTRAMUSCULAR | Status: AC | PRN
  Administered 2018-09-30: 12:00:00 15 mL

## 2018-09-30 MED ORDER — MIDAZOLAM HCL 2 MG/2ML IJ SOLN
INTRAMUSCULAR | Status: AC
  Filled 2018-09-30: qty 2

## 2018-09-30 MED ORDER — SODIUM CHLORIDE 0.9 % IV SOLN
INTRAVENOUS | Status: AC | PRN
  Administered 2018-09-30: 10 mL/h via INTRAVENOUS

## 2018-09-30 MED ORDER — LIDOCAINE HCL 1 % IJ SOLN
INTRAMUSCULAR | Status: AC
  Filled 2018-09-30: qty 20

## 2018-09-30 MED ORDER — HYDROCODONE-ACETAMINOPHEN 5-325 MG PO TABS: 1.0000 | ORAL_TABLET | ORAL | Status: DC | PRN

## 2018-09-30 MED ORDER — FENTANYL CITRATE (PF) 100 MCG/2ML IJ SOLN
INTRAMUSCULAR | Status: AC
  Filled 2018-09-30: qty 2

## 2018-09-30 MED ORDER — CEFAZOLIN SODIUM-DEXTROSE 2-4 GM/100ML-% IV SOLN
INTRAVENOUS | Status: AC | PRN
  Administered 2018-09-30: 2 g via INTRAVENOUS

## 2018-09-30 MED ORDER — HYDRALAZINE HCL 20 MG/ML IJ SOLN
5.0000 mg | Freq: Once | INTRAMUSCULAR | Status: AC
  Administered 2018-09-30: 12:00:00 5 mg via INTRAVENOUS

## 2018-09-30 NOTE — Progress Notes (Signed)
Baldo Ash, RN, from [facility] returned phone call to let me know that pt COVID results on 09/27/18 are negative. She states that she will fax results.

## 2018-09-30 NOTE — Progress Notes (Signed)
Phone call to [facility] facility. Spoke with RN on [employer] taking care of Mr [place]. She states that Mr [place] did have a covid test on Friday 7/24 but she does not know the results yet. She states that she will log into the system and confirm that his test is negative before sending him for procedure. She states that he is without s/s of covid-19, no fever, cough, chills, loss of appetite/smell. She also states that the residents are tested each Friday and his test was negative on 09/20/18. I adivsed that we need a negative result within 72hours. Provided fax number and my direct line for her to call me back with results from Friday 09/27/18

## 2018-09-30 NOTE — Procedures (Signed)
Interventional Radiology Procedure:   Indications: Dysphagia  Procedure: Gastrostomy tube placement  Findings: 20 Fr drain in stomach  Complications: None     EBL: Minimal, less than 20 ml  Plan: Discharge to nursing facility in 2 hours.    [place] R. Anselm Pancoast, MD  Pager: [phone]

## 2018-09-30 NOTE — Progress Notes (Signed)
Report called to pt's nurse at [facility]. Discharge instructions given.  Written instructions also sent with pt

## 2018-09-30 NOTE — H&P (Signed)
Chief Complaint: Patient was seen in consultation today for gastrostomy tube placement.  Referring Physician(s): [name],[name]  Supervising Physician: Richarda OverlieHenn, Adam  [facility]  History of Present Illness: [place] [facility] is a 78 y.o. male with a past medical history significant for HTN, seizures, CVA, neurosyphilis and Alzheimer's disease who presents today from [place] [facility] for a gastrostomy tube placement. Patient is a poor historian secondary to alzheimer's - all history obtained from chart review and during phone conversation with patient's sister [place] [facility]. Per chart patient was admitted to [facility] on 08/16/18 due to worsening AMS, he was discharged back to [facility] on 6/14 after thorough evaluation of AMS. He was noted by his niece and [facility] staff to have poor oral intake with increased lethargy and difficulty taking anything by mouth. She was seen by speech during his admission and oral delay complicated by lack of teeth was noted but no frank aspiration.   Per conversation via phone with patient's sister [place] today she states that he has been refusing to eat or drink very much at all since he was discharged from the hospital about a month ago. They are worried because he is losing weight and they are concerned that this will shorten his life. She states that a g-tube was discussed with the family by the physician who follows him at the [facility] and after the family discussed the procedure they felt it was best to place a feeding tube so that he can receive adequate nutrition. She states understanding to the requested procedure and gives consent to proceed.  Patient laying in bed watching TV on exam, he can tell me his name and states that he is in the hospital. He is unable to provide any other information - he tells me he lives in [place], the day of the week is Thursday, the month is Friday and the year is [id number].   Past Medical History:  Diagnosis Date   Alzheimer  disease    Hypertension    Seizures     No past surgical history on file.  Allergies: Patient has no known allergies.  Medications: Prior to Admission medications   Medication Sig Start Date End Date Taking? Authorizing Provider  Amino Acids-Protein Hydrolys (FEEDING SUPPLEMENT, PRO-STAT SUGAR FREE 64,) LIQD Take 30 mLs by mouth 2 (two) times a day.   Yes [provider]  aspirin 81 MG chewable tablet Chew 81 mg by mouth daily.   Yes [provider]  divalproex (DEPAKOTE) 250 MG DR tablet Take 250 mg by mouth at bedtime.   Yes [provider]  donepezil (ARICEPT) 10 MG tablet Take 10 mg by mouth at bedtime.   Yes [provider]  ipratropium-albuterol (DUONEB) 0.5-2.5 (3) MG/3ML SOLN Take 3 mLs by nebulization every 6 (six) hours as needed (for shortness of breath or wheezing).   Yes [provider]  lactulose (CHRONULAC) 10 GM/15ML solution Take 20 g by mouth daily as needed for moderate constipation.    Yes [provider]  levETIRAcetam (KEPPRA) 100 MG/ML solution Take 750 mg by mouth 2 (two) times daily.   Yes [provider]  losartan (COZAAR) 50 MG tablet Take 50 mg by mouth daily.   Yes [provider]  Multiple Vitamins-Minerals (CERTA-VITE PO) Take 1 tablet by mouth daily.   Yes [provider]  Nutritional Supplements (RESOURCE 2.0 PO) Take 120 mLs by mouth 2 (two) times a day.   Yes [provider]  Family History  Problem Relation Age of Onset   Hyperlipidemia Mother    Hypertension Mother    Hypertension Father    Hyperlipidemia Father     Social History   Socioeconomic History   Marital status: Single    Spouse name: Not on file   Number of children: Not on file   Years of education: Not on file   Highest education level: Not on file  Occupational History   Not on file  Social Needs   Financial resource strain: Not on file   Food insecurity    Worry:  Not on file    Inability: Not on file   Transportation needs    Medical: Not on file    Non-medical: Not on file  Tobacco Use   Smoking status: Former Smoker  Substance and Sexual Activity   Alcohol use: Yes   Drug use: Not on file   Sexual activity: Not on file  Lifestyle   Physical activity    Days per week: Not on file    Minutes per session: Not on file   Stress: Not on file  Relationships   Social connections    Talks on phone: Not on file    Gets together: Not on file    Attends religious service: Not on file    Active member of club or organization: Not on file    Attends meetings of clubs or organizations: Not on file    Relationship status: Not on file  Other Topics Concern   Not on file  Social History Narrative   Not on file     Review of Systems: A 12 point ROS discussed and pertinent positives are indicated in the HPI above.  All other systems are negative.  Review of Systems  Unable to perform ROS: Dementia    Vital Signs: BP (!) 152/94    Pulse (!) 48    Temp (!) 97.1 F (36.2 C)    Ht 5\' 11"  (1.803 m)    Wt 161 lb (73 kg)    SpO2 97%    BMI 22.45 kg/m   Physical Exam Vitals signs reviewed.  Constitutional:      General: He is not in acute distress.    Comments: Frail, elderly male. Pleasant, poor historian 2/2 dementia  HENT:     Head: Normocephalic.  Cardiovascular:     Rate and Rhythm: Normal rate and regular rhythm.  Pulmonary:     Effort: Pulmonary effort is normal.     Breath sounds: Normal breath sounds.  Abdominal:     General: Bowel sounds are normal. There is no distension.     Palpations: Abdomen is soft.     Tenderness: There is no abdominal tenderness.  Skin:    General: Skin is warm and dry.     Findings: No bruising, lesion or rash.  Neurological:     Mental Status: He is alert. He is disoriented.      MD Evaluation Airway: WNL Heart: WNL Abdomen: WNL Chest/ Lungs: WNL ASA  Classification:  3 Mallampati/Airway Score: Two   Imaging: Ct Abdomen Wo Contrast  Result Date: 09/17/2018 CLINICAL DATA:  Encounter for gastrojejunal tube placement. EXAM: CT ABDOMEN WITHOUT CONTRAST TECHNIQUE: Multidetector CT imaging of the abdomen was performed following the standard protocol without IV contrast. COMPARISON:  None. FINDINGS: Lower chest: Emphysema with bulla disease at the lung bases. Right basilar densities are most compatible with right basilar atelectasis. Small calcification along the posterior left pleural  surface. Hepatobiliary: Limited evaluation of the liver and gallbladder due to motion artifact. Hypodensity near the falciform ligament likely represents focal fat. There appears to be a small gallbladder but poorly characterized. Pancreas: Unremarkable. No pancreatic ductal dilatation or surrounding inflammatory changes. Spleen: Spleen is small and poorly characterized. Adrenals/Urinary Tract: Adrenal glands are within normal limits. Hypodensity in the right kidney lower pole is indeterminate on this noncontrast examination but could represent a cyst. Difficult to exclude hypodensities in the mid left kidney. No hydronephrosis. Stomach/Bowel: Transverse colon extends caudal to the stomach. The left side of the transverse colon and splenic flexure are lateral to the stomach. No bowel structures anterior to the stomach. Small to moderate sized hiatal hernia. Normal appearance of the duodenum and visualized small bowel. Vascular/Lymphatic: Aneurysm of the proximal abdominal aorta at the level of the hiatus and supra celiac region. Proximal abdominal aorta measures up to 3.9 cm. The descending thoracic aorta measures 3.2 cm. Heavily calcified coronary arteries. Atherosclerotic calcifications in the abdominal aorta. Other: Negative for ascites.  No free air. Musculoskeletal: No acute bone abnormality. IMPRESSION: 1. Anatomy should be amendable for a percutaneous gastrostomy tube placement. A  potentially complicating issue is a small to moderate sized hiatal hernia. 2. 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 Recommend followup by ultrasound in 2 years. This recommendation follows ACR consensus guidelines: White Paper of the ACR Incidental Findings Committee II on Vascular Findings. J Am Coll Radiol 2013; 10:789-794. Aortic aneurysm NOS (ICD10-I71.9). 3. Aortic Atherosclerosis (ICD10-I70.0) and Emphysema (ICD10-J43.9). Electronically Signed   By: Richarda OverlieAdam  Henn M.D.   On: 09/17/2018 16:16    Labs:  CBC: Recent Labs    08/16/18 1030 08/16/18 1117 08/17/18 1226 08/18/18 0717 09/30/18 0917  WBC 5.6  --  5.0 5.1 5.4  HGB 14.6 15.3   15.6 13.6 13.8 13.2  HCT 46.3 45.0   46.0 42.3 42.4 40.7  PLT 160  --  156 176 197    COAGS: Recent Labs    08/16/18 1030 09/30/18 0917  INR 1.2 1.1    BMP: Recent Labs    08/16/18 1030 08/16/18 1117 08/17/18 1226 08/18/18 0717  NA 140 142   142 140 139  K 3.6 3.7   3.6 3.3* 3.5  CL 105 106 107 103  CO2 27  --  24 28  GLUCOSE 173* 170* 84 102*  BUN 14 15 8  7*  CALCIUM 9.2  --  9.0 9.1  CREATININE 0.86 0.70 0.75 0.72  GFRNONAA >60  --  >60 >60  GFRAA >60  --  >60 >60    LIVER FUNCTION TESTS: Recent Labs    08/16/18 1030 08/18/18 0717  BILITOT 0.4 0.5  AST 18 19  ALT 12 13  ALKPHOS 49 48  PROT 7.4 7.1  ALBUMIN 3.0* 2.9*    TUMOR MARKERS: No results for input(s): AFPTM, CEA, CA199, CHROMGRNA in the last 8760 hours.  Assessment and Plan:  78 y/o M with history of CVA, neurosyphilis and Alzheimer's who presents today for gastrostomy tube placement. Patient has had declining PO intake without noted aspiration on most recent speech evaluation and request has been made to IR for gastrostomy tube placement to supplement nutrition.  Patient has been NPO since midnight, he does not take blood thinning medications per [facility]. Afebrile, WBC 5.4, hgb 13.2, plt 197, INR 1.1.  Risks and benefits discussed  with the patient' sister, [place] [facility], via phone including, but not limited to the need  for a barium enema during the procedure, bleeding, infection, peritonitis, or damage to adjacent structures.  All of the patient's sister's questions were answered, patient' sister is agreeable to proceed.  Consent signed and in IR folder.  Thank you for this interesting consult.  I greatly enjoyed meeting MOOSA BUECHE and look forward to participating in their care.  A copy of this report was sent to the requesting provider on this date.  Electronically Signed: Joaquim Nam, PA-C 09/30/2018, 10:13 AM   I spent a total of  30 Minutes   in face to face in clinical consultation, greater than 50% of which was counseling/coordinating care for gastrostomy tube placement.

## 2018-09-30 NOTE — Discharge Instructions (Signed)
How to Care for a Feeding Tube  A feeding tube is a soft, flexible tube through which medicine, water, and liquid food can be given. A person may have a feeding tube if she or he has trouble swallowing or cannot have food or medicine by mouth. Supplies needed to care for the tube site:  Clean gloves.  Clean washcloth, gauze pads, or soft paper towel.  Cotton swabs.  Skin barrier ointment or cream, such as petroleum jelly.  Soap and water.  Pre-cut foam pads or gauze for around the tube.  Tube tape.  Anchoring device (optional). How to care for the tube site  1. Have all supplies ready and available. 2. Wash your hands well. 3. Put on clean gloves. 4. If there is a foam pad or gauze under the tube stabilizing disc and it is soiled or moist or has been there for more than one day, replace it. 5. Check the skin around the tube site for redness, a rash, swelling, drainage, or extra tissue growth. If you notice any of these, call your health care provider. 6. Use water and soap to moisten gauze pads and cotton swabs. 7. Use the moistened cotton swabs to wipe the area closest to the tube, right near the opening in the abdomen (stoma). 8. Use the moistened gauze pads to wipe the surrounding skin. 9. Rinse with water. 10. Use a washcloth, dry gauze pad, or soft paper towel to dry the skin and stoma site. 11. If the skin is red, use a cotton swab to apply a skin barrier cream or ointment in a circular motion. The cream or ointment will help the wound to heal. Do not apply antibiotic ointments at the tube site. 12. Apply a new pre-cut foam pad or gauze around the tube. If there is no drainage, you can leave off the foam pads or gauze. 13. Secure the pre-cut foam pad or gauze with tape around the edges. 14. Use tape or an anchoring device to fasten the feeding tube to the skin for comfort or as directed. Alternate where you put the tape to avoid damaging the skin. 15. Position the person in  a semi-upright position, at about a 30-45 degree angle. 16. Throw away used supplies. 17. Remove your gloves. 18. Wash your hands. Supplies needed to flush a feeding tube:  Clean gloves.  A clean 60 mL syringe that connects to the feeding tube.  Towel.  Sterile or purified water. Follow these guidelines: ? Use sterile water if:  You have a weak immune system and have difficulty fighting off infections (are immunocompromised).  You are unsure about the amount of chemical contaminants in purified or drinking water. ? Do not use fresh water found in lakes, rivers, reservoirs, or aquifers without treating or filtering first. ? To purify drinking water by boiling:  Boil water for at least 1 minute. Keep a lid over the water while it boils.  Allow the water to cool to room temperature before using. How to flush a feeding tube 1. Have all supplies ready and available. 2. Wash your hands well. 3. Put on clean gloves. 4. Draw up 30 mL of water into the syringe. 5. Before flushing, place the towel under the tube to catch any fluid leaks. 6. Kink the feeding tube while disconnecting it from the feeding-bag tubing or while removing the cap at the end of the tube. Kinking closes the tube and prevents fluid in the tube from spilling out. 7. Insert the tip  of the syringe into the end of the feeding tube. 8. Release the kink. 9. Slowly inject the water. If you are unable to inject the water, the tip of the tube may be against the person's stomach, blocking fluid flow. To fix this problem, have the person with the feeding tube lie on his or her left side. Then, try injecting the water again. If there is resistance, do not use a lot of force to overcome it because this could cause the tube to tear. 10. Remove the syringe and replace the cap. 11. Throw away used supplies. 12. Remove your gloves. 13. Wash your hands. Follow these instructions at home: Caring for the tube  If there is a foam pad  or gauze under the tube stabilizing disc, change it every day and when it is soiled or moist.  Do not apply antibiotic ointments at the tube site. Flushing the tube  Do not use a syringe that is smaller than 60 mL.  To prevent medicine from clogging the tube, flush the tube at all of these times: ? Before the person is given the first medicine. ? Between medicines. ? After the person is given the final medicine before starting a feeding.  Do not mix medicines with formula or with other medicines.  Thoroughly flush medicines through the tube so they do not mix with formula. Contact a health care provider if:  The tube becomes blocked or clogged.  You find any of these on the skin around the tube site: ? Redness. ? A rash. ? Swelling. ? Drainage. ? Extra tissue growth. Summary  A feeding tube is a soft, flexible tube through which medicine, water, and liquid food can be given. A person may have a feeding tube if she or he has trouble swallowing or cannot have food or medicine by mouth.  Follow instructions from your health care provider about daily care and flushing of the tube.  Contact your health care provider if the tube becomes blocked or clogged, or if you notice swelling, drainage, or changes in skin around the tube site. This information is not intended to replace advice given to you by your health care provider. Make sure you discuss any questions you have with your health care provider. Document Released: 02/20/2005 Document Revised: 02/02/2017 Document Reviewed: 03/25/2016 Elsevier Patient Education  2020 [employer].

## 2018-10-28 ENCOUNTER — Other Ambulatory Visit: Payer: Self-pay | Admitting: Internal Medicine

## 2018-10-28 ENCOUNTER — Other Ambulatory Visit (HOSPITAL_COMMUNITY): Payer: Self-pay | Admitting: Internal Medicine

## 2018-10-28 DIAGNOSIS — M869 Osteomyelitis, unspecified: Secondary | ICD-10-CM

## 2018-11-05 ENCOUNTER — Other Ambulatory Visit: Payer: Self-pay

## 2018-11-05 ENCOUNTER — Other Ambulatory Visit (HOSPITAL_COMMUNITY): Payer: Self-pay | Admitting: Internal Medicine

## 2018-11-05 ENCOUNTER — Ambulatory Visit (HOSPITAL_COMMUNITY)
Admission: RE | Admit: 2018-11-05 | Discharge: 2018-11-05 | Disposition: A | Discharge status: Home / Self Care | Payer: Medicare Other | Source: Ambulatory Visit | Attending: Internal Medicine | Admitting: Internal Medicine

## 2018-11-05 DIAGNOSIS — M869 Osteomyelitis, unspecified: Secondary | ICD-10-CM | POA: Insufficient documentation

## 2018-11-05 LAB — POCT I-STAT CREATININE: Creatinine, Ser: 0.7 mg/dL (ref 0.61–1.24)

## 2018-11-18 ENCOUNTER — Ambulatory Visit (INDEPENDENT_AMBULATORY_CARE_PROVIDER_SITE_OTHER): Payer: Medicare Other | Admitting: Internal Medicine

## 2018-11-18 ENCOUNTER — Other Ambulatory Visit: Payer: Self-pay

## 2018-11-18 VITALS — BP 128/78 | HR 91 | Temp 98.7°F

## 2018-11-18 DIAGNOSIS — M4628 Osteomyelitis of vertebra, sacral and sacrococcygeal region: Secondary | ICD-10-CM

## 2018-11-18 NOTE — Patient Instructions (Signed)
   Patient will need :  Weekly cbc, sed rate, and crp, and vanco trough Twice a week bmp vanco dosing per protocol: aim for trough of 15-20  Weekly picc line care  Wound care: recommend twice a day dressing changes continue with santyl per dressing change Would pack with curlex to absorb excess moisture  Add moisture barrier with antifungal cream to aggravated area near pressure wound

## 2018-11-18 NOTE — Progress Notes (Signed)
RFV: new patient for sacral wound/osteo  Patient ID: Joshua Gonzales, male   DOB: May 30, 1940, 78 y.o.   MRN: [id number]  HPI Joshua Gonzales a 78 y.o.malewith medical history significant ofAlzheimer's dementia, hypertension, CVA with residual deficits, neurosyphilis, and seizure disorder; who was started on  iv vanco for 4 wk for sacral wound/osteomyelitis. He is transferred to the clinic by ambulance. Unable to give much history to time he has had sacral wound. On exam, he has contracture of lower extremities. Appears bed bound which places him at risk for sacral wounds. Had mri that showed + osteo.   FINDINGS: Bones/Joint/Cartilage  Abnormal marrow edema at the sacrococcygeal junction with corresponding heterogeneously decreased T1 marrow signal. No fracture or dislocation. Normal alignment. No joint effusion.  Muscles and Tendons Mild symmetric bilateral gluteal muscle edema. No significant muscle atrophy.  Soft tissue Midline decubitus ulcer over the sacrococcygeal junction. Mild presacral edema. Superficial soft tissue edema overlying the lower right paraspinous muscles. No fluid collection or hematoma. No soft tissue mass.  IMPRESSION: 1. Midline decubitus ulcer over the sacrococcygeal junction with underlying osteomyelitis. No abscess. Outpatient Encounter Medications as of 11/18/2018  Medication Sig  . Amino Acids-Protein Hydrolys (FEEDING SUPPLEMENT, PRO-STAT SUGAR FREE 64,) LIQD Take 30 mLs by mouth 2 (two) times a day.  Marland Kitchen aspirin 81 MG chewable tablet Chew 81 mg by mouth daily.  . divalproex (DEPAKOTE) 250 MG DR tablet Take 250 mg by mouth at bedtime.  . donepezil (ARICEPT) 10 MG tablet Take 10 mg by mouth at bedtime.  Marland Kitchen ipratropium-albuterol (DUONEB) 0.5-2.5 (3) MG/3ML SOLN Take 3 mLs by nebulization every 6 (six) hours as needed (for shortness of breath or wheezing).  Marland Kitchen lactulose (CHRONULAC) 10 GM/15ML solution Take 20 g by mouth daily as needed for  moderate constipation.   . levETIRAcetam (KEPPRA) 100 MG/ML solution Take 750 mg by mouth 2 (two) times daily.  Marland Kitchen losartan (COZAAR) 50 MG tablet Take 50 mg by mouth daily.  . Multiple Vitamins-Minerals (CERTA-VITE PO) Take 1 tablet by mouth daily.  . Nutritional Supplements (RESOURCE 2.0 PO) Take 120 mLs by mouth 2 (two) times a day.   No facility-administered encounter medications on file as of 11/18/2018.      Patient Active Problem List   Diagnosis Date Noted  . Goals of care, counseling/discussion   . Palliative care by specialist   . FTT (failure to thrive) in adult   . Type 2 diabetes mellitus without complication ([place]) 67/02/4579  . History of CVA with residual deficit 08/16/2018  . Hyperammonemia ([place]) 08/16/2018  . Bradycardia 08/16/2018  . Lactic acid increased   . Hypotension 06/03/2014  . Near syncope 06/03/2014  . AKI (acute kidney injury) ([place]) 06/03/2014  . Partial anterior cerebral circulation infarction ([place])   . Dementia with behavioral disturbance ([place])   . PAF (paroxysmal atrial fibrillation) ([place])   . History of seizures 05/15/2014  . Essential hypertension, benign 01/24/2013  . Unspecified late effects of cerebrovascular disease 01/24/2013  . Acute encephalopathy 11/26/2012  . Hypokalemia 11/26/2012  . Alcoholism in remission [facility]) 11/26/2012     Health Maintenance Due  Topic Date Due  . FOOT EXAM  11/29/1952  . OPHTHALMOLOGY EXAM  11/29/1952  . COLONOSCOPY  11/29/1992  . HEMOGLOBIN A1C  11/15/2014  . INFLUENZA VACCINE  10/05/2018     Review of Systems 12 point ros is limited by patient's underlying dementia/encephalopathy Physical Exam   BP 128/78   Pulse 91   Temp 98.7 F (  37.1 C) (Oral)   SpO2 93%    Physical Exam  Constitutional: He is oriented to person, ionly.  He appears chronically ill and well-nourished. No distress.  HENT:  Mouth/Throat: Oropharynx is clear and moist. No oropharyngeal exudate.  Cardiovascular: Normal rate,  regular rhythm and normal heart sounds. Exam reveals no gallop and no friction rub.  No murmur heard.  Pulmonary/Chest: Effort normal and breath sounds normal. No respiratory distress. He has no wheezes.  Abdominal: Soft. Bowel sounds are normal. He exhibits no distension. There is no tenderness.  Buttock = 6.4 x 4.5 lesion with 2cm undermining lower quadrants. Macerated edges. Tan debris in would bed Ext: contracted legs, muscle wasting Skin: Skin is warm and dry. No rash noted. No erythema.    Lab Results  Component Value Date   HIV1RNAQUANT <20 11/25/2012   No results found for: HEPBSAB Lab Results  Component Value Date   LABRPR Reactive (A) 11/24/2012    CBC Lab Results  Component Value Date   WBC 5.4 09/30/2018   RBC 4.25 09/30/2018   HGB 13.2 09/30/2018   HCT 40.7 09/30/2018   PLT 197 09/30/2018   MCV 95.8 09/30/2018   MCH 31.1 09/30/2018   MCHC 32.4 09/30/2018   RDW 14.9 09/30/2018   LYMPHSABS 2.7 08/18/2018   MONOABS 0.5 08/18/2018   EOSABS 0.1 08/18/2018    BMET Lab Results  Component Value Date   NA 139 08/18/2018   K 3.5 08/18/2018   CL 103 08/18/2018   CO2 28 08/18/2018   GLUCOSE 102 (H) 08/18/2018   BUN 7 (L) 08/18/2018   CREATININE 0.70 11/05/2018   CALCIUM 9.1 08/18/2018   GFRNONAA >60 08/18/2018   GFRAA >60 08/18/2018    Assessment and Plan  Patient refused surveillance labs-verbal outbursts  For sacral osteo = Plan to treat with vancomycin for a total of 6 wk then convert to oral doxycycline. Recommend wound care with santyl to debrie the wound bed plus twice a day dressing changes so that the area has less macerated edges

## 2018-11-22 ENCOUNTER — Encounter (HOSPITAL_BASED_OUTPATIENT_CLINIC_OR_DEPARTMENT_OTHER): Payer: Medicare Other

## 2018-11-23 ENCOUNTER — Emergency Department (HOSPITAL_COMMUNITY): Payer: Medicare Other

## 2018-11-23 ENCOUNTER — Encounter (HOSPITAL_COMMUNITY): Payer: Self-pay | Admitting: Emergency Medicine

## 2018-11-23 ENCOUNTER — Observation Stay (HOSPITAL_COMMUNITY): Payer: Medicare Other

## 2018-11-23 ENCOUNTER — Inpatient Hospital Stay (HOSPITAL_COMMUNITY)
Admission: EM | Admit: 2018-11-23 | Discharge: 2018-11-28 | DRG: 637 | Disposition: A | Discharge status: Skilled Nursing Facility | Payer: Medicare Other | Source: Skilled Nursing Facility | Attending: Internal Medicine | Admitting: Internal Medicine

## 2018-11-23 ENCOUNTER — Other Ambulatory Visit: Payer: Self-pay

## 2018-11-23 DIAGNOSIS — R4182 Altered mental status, unspecified: Secondary | ICD-10-CM

## 2018-11-23 DIAGNOSIS — M869 Osteomyelitis, unspecified: Secondary | ICD-10-CM | POA: Diagnosis present

## 2018-11-23 DIAGNOSIS — Z95828 Presence of other vascular implants and grafts: Secondary | ICD-10-CM

## 2018-11-23 DIAGNOSIS — Y848 Other medical procedures as the cause of abnormal reaction of the patient, or of later complication, without mention of misadventure at the time of the procedure: Secondary | ICD-10-CM | POA: Diagnosis present

## 2018-11-23 DIAGNOSIS — Z66 Do not resuscitate: Secondary | ICD-10-CM | POA: Diagnosis present

## 2018-11-23 DIAGNOSIS — I480 Paroxysmal atrial fibrillation: Secondary | ICD-10-CM | POA: Diagnosis present

## 2018-11-23 DIAGNOSIS — M8668 Other chronic osteomyelitis, other site: Secondary | ICD-10-CM

## 2018-11-23 DIAGNOSIS — F0391 Unspecified dementia with behavioral disturbance: Secondary | ICD-10-CM | POA: Diagnosis present

## 2018-11-23 DIAGNOSIS — Z792 Long term (current) use of antibiotics: Secondary | ICD-10-CM

## 2018-11-23 DIAGNOSIS — E11621 Type 2 diabetes mellitus with foot ulcer: Secondary | ICD-10-CM | POA: Diagnosis present

## 2018-11-23 DIAGNOSIS — R509 Fever, unspecified: Secondary | ICD-10-CM | POA: Diagnosis not present

## 2018-11-23 DIAGNOSIS — G309 Alzheimer's disease, unspecified: Secondary | ICD-10-CM | POA: Diagnosis present

## 2018-11-23 DIAGNOSIS — F0280 Dementia in other diseases classified elsewhere without behavioral disturbance: Secondary | ICD-10-CM

## 2018-11-23 DIAGNOSIS — F0281 Dementia in other diseases classified elsewhere with behavioral disturbance: Secondary | ICD-10-CM | POA: Diagnosis present

## 2018-11-23 DIAGNOSIS — G40909 Epilepsy, unspecified, not intractable, without status epilepticus: Secondary | ICD-10-CM | POA: Diagnosis present

## 2018-11-23 DIAGNOSIS — Z87891 Personal history of nicotine dependence: Secondary | ICD-10-CM

## 2018-11-23 DIAGNOSIS — Z8249 Family history of ischemic heart disease and other diseases of the circulatory system: Secondary | ICD-10-CM

## 2018-11-23 DIAGNOSIS — Z79891 Long term (current) use of opiate analgesic: Secondary | ICD-10-CM

## 2018-11-23 DIAGNOSIS — Z9181 History of falling: Secondary | ICD-10-CM

## 2018-11-23 DIAGNOSIS — Z87440 Personal history of urinary (tract) infections: Secondary | ICD-10-CM

## 2018-11-23 DIAGNOSIS — T827XXA Infection and inflammatory reaction due to other cardiac and vascular devices, implants and grafts, initial encounter: Secondary | ICD-10-CM | POA: Diagnosis present

## 2018-11-23 DIAGNOSIS — K5900 Constipation, unspecified: Secondary | ICD-10-CM | POA: Diagnosis present

## 2018-11-23 DIAGNOSIS — Z7982 Long term (current) use of aspirin: Secondary | ICD-10-CM

## 2018-11-23 DIAGNOSIS — Z20828 Contact with and (suspected) exposure to other viral communicable diseases: Secondary | ICD-10-CM | POA: Diagnosis present

## 2018-11-23 DIAGNOSIS — L8990 Pressure ulcer of unspecified site, unspecified stage: Secondary | ICD-10-CM | POA: Insufficient documentation

## 2018-11-23 DIAGNOSIS — L89154 Pressure ulcer of sacral region, stage 4: Secondary | ICD-10-CM | POA: Diagnosis not present

## 2018-11-23 DIAGNOSIS — E1169 Type 2 diabetes mellitus with other specified complication: Principal | ICD-10-CM | POA: Diagnosis present

## 2018-11-23 DIAGNOSIS — Z87898 Personal history of other specified conditions: Secondary | ICD-10-CM

## 2018-11-23 DIAGNOSIS — I10 Essential (primary) hypertension: Secondary | ICD-10-CM | POA: Diagnosis not present

## 2018-11-23 DIAGNOSIS — G9341 Metabolic encephalopathy: Secondary | ICD-10-CM | POA: Diagnosis not present

## 2018-11-23 DIAGNOSIS — M4628 Osteomyelitis of vertebra, sacral and sacrococcygeal region: Secondary | ICD-10-CM

## 2018-11-23 DIAGNOSIS — F03918 Unspecified dementia, unspecified severity, with other behavioral disturbance: Secondary | ICD-10-CM | POA: Diagnosis present

## 2018-11-23 DIAGNOSIS — L0390 Cellulitis, unspecified: Secondary | ICD-10-CM | POA: Diagnosis present

## 2018-11-23 DIAGNOSIS — Z79899 Other long term (current) drug therapy: Secondary | ICD-10-CM

## 2018-11-23 DIAGNOSIS — Z7401 Bed confinement status: Secondary | ICD-10-CM

## 2018-11-23 DIAGNOSIS — I6930 Unspecified sequelae of cerebral infarction: Secondary | ICD-10-CM

## 2018-11-23 DIAGNOSIS — Z931 Gastrostomy status: Secondary | ICD-10-CM

## 2018-11-23 DIAGNOSIS — F0151 Vascular dementia with behavioral disturbance: Secondary | ICD-10-CM | POA: Diagnosis not present

## 2018-11-23 DIAGNOSIS — E860 Dehydration: Secondary | ICD-10-CM | POA: Diagnosis present

## 2018-11-23 DIAGNOSIS — E119 Type 2 diabetes mellitus without complications: Secondary | ICD-10-CM

## 2018-11-23 DIAGNOSIS — L03312 Cellulitis of back [any part except buttock]: Secondary | ICD-10-CM | POA: Diagnosis present

## 2018-11-23 DIAGNOSIS — R627 Adult failure to thrive: Secondary | ICD-10-CM | POA: Diagnosis present

## 2018-11-23 DIAGNOSIS — Z8349 Family history of other endocrine, nutritional and metabolic diseases: Secondary | ICD-10-CM

## 2018-11-23 LAB — COMPREHENSIVE METABOLIC PANEL
ALT: 24 U/L (ref 0–44)
AST: 29 U/L (ref 15–41)
Albumin: 3 g/dL — ABNORMAL LOW (ref 3.5–5.0)
Alkaline Phosphatase: 75 U/L (ref 38–126)
Anion gap: 12 (ref 5–15)
BUN: 19 mg/dL (ref 8–23)
CO2: 26 mmol/L (ref 22–32)
Calcium: 9.4 mg/dL (ref 8.9–10.3)
Chloride: 102 mmol/L (ref 98–111)
Creatinine, Ser: 0.66 mg/dL (ref 0.61–1.24)
GFR calc Af Amer: >60 mL/min (ref >60)
GFR calc non Af Amer: >60 mL/min (ref >60)
Glucose, Bld: 114 mg/dL — ABNORMAL HIGH (ref 70–99)
Potassium: 4.3 mmol/L (ref 3.5–5.1)
Sodium: 140 mmol/L (ref 135–145)
Total Bilirubin: 0.3 mg/dL (ref 0.3–1.2)
Total Protein: 8.1 g/dL (ref 6.5–8.1)

## 2018-11-23 LAB — MAGNESIUM: Magnesium: 1.9 mg/dL (ref 1.7–2.4)

## 2018-11-23 LAB — PROTIME-INR
INR: 1.1 (ref 0.8–1.2)
Prothrombin Time: 14.3 seconds (ref 11.4–15.2)

## 2018-11-23 LAB — CBC WITH DIFFERENTIAL/PLATELET
Abs Immature Granulocytes: 0.02 10*3/uL (ref 0.00–0.07)
Basophils Absolute: 0.1 10*3/uL (ref 0.0–0.1)
Basophils Relative: 1 %
Eosinophils Absolute: 0.4 10*3/uL (ref 0.0–0.5)
Eosinophils Relative: 5 %
HCT: 39.9 % (ref 39.0–52.0)
Hemoglobin: 12.6 g/dL — ABNORMAL LOW (ref 13.0–17.0)
Immature Granulocytes: 0 %
Lymphocytes Relative: 24 %
Lymphs Abs: 1.8 10*3/uL (ref 0.7–4.0)
MCH: 31.3 pg (ref 26.0–34.0)
MCHC: 31.6 g/dL (ref 30.0–36.0)
MCV: 99.3 fL (ref 80.0–100.0)
Monocytes Absolute: 0.5 10*3/uL (ref 0.1–1.0)
Monocytes Relative: 7 %
Neutro Abs: 4.8 10*3/uL (ref 1.7–7.7)
Neutrophils Relative %: 63 %
Platelets: 245 10*3/uL (ref 150–400)
RBC: 4.02 MIL/uL — ABNORMAL LOW (ref 4.22–5.81)
RDW: 15.7 % — ABNORMAL HIGH (ref 11.5–15.5)
WBC: 7.5 10*3/uL (ref 4.0–10.5)
nRBC: 0 % (ref 0.0–0.2)

## 2018-11-23 LAB — LACTIC ACID, PLASMA
Lactic Acid, Venous: 1.5 mmol/L (ref 0.5–1.9)
Lactic Acid, Venous: 1.4 mmol/L (ref 0.5–1.9)

## 2018-11-23 LAB — VANCOMYCIN, RANDOM: Vancomycin Rm: 23

## 2018-11-23 LAB — AMMONIA: Ammonia: 27 umol/L (ref 9–35)

## 2018-11-23 LAB — URINALYSIS, ROUTINE W REFLEX MICROSCOPIC
Bacteria, UA: NEGATIVE — AB
Bilirubin Urine: NEGATIVE
Glucose, UA: NEGATIVE mg/dL
Hgb urine dipstick: NEGATIVE
Ketones, ur: NEGATIVE mg/dL
Leukocytes,Ua: NEGATIVE
Nitrite: NEGATIVE
Protein, ur: NEGATIVE mg/dL
Specific Gravity, Urine: 1.034 — ABNORMAL HIGH (ref 1.005–1.030)
pH: 5 (ref 5.0–8.0)

## 2018-11-23 LAB — SARS CORONAVIRUS 2 (TAT 6-24 HRS): SARS Coronavirus 2: NEGATIVE

## 2018-11-23 LAB — GLUCOSE, CAPILLARY: Glucose-Capillary: 87 mg/dL (ref 70–99)

## 2018-11-23 LAB — VANCOMYCIN, TROUGH: Vancomycin Tr: 21 ug/mL (ref 15–20)

## 2018-11-23 LAB — APTT: aPTT: 35 seconds (ref 24–36)

## 2018-11-23 LAB — VALPROIC ACID LEVEL: Valproic Acid Lvl: <10 ug/mL — ABNORMAL LOW (ref 50.0–100.0)

## 2018-11-23 MED ORDER — ALTEPLASE 2 MG IJ SOLR
2.0000 mg | Freq: Once | INTRAMUSCULAR | Status: AC
  Administered 2018-11-23: 2 mg
  Filled 2018-11-23: qty 2

## 2018-11-23 MED ORDER — FLEET ENEMA 7-19 GM/118ML RE ENEM: 1.0000 | ENEMA | Freq: Once | RECTAL | Status: DC | PRN

## 2018-11-23 MED ORDER — SODIUM CHLORIDE 0.9 % IV BOLUS
1000.0000 mL | Freq: Once | INTRAVENOUS | Status: AC
  Administered 2018-11-23: 1000 mL via INTRAVENOUS

## 2018-11-23 MED ORDER — SENNA 8.6 MG PO TABS
1.0000 | ORAL_TABLET | Freq: Two times a day (BID) | ORAL | Status: DC
  Administered 2018-11-23 – 2018-11-28 (×9): 8.6 mg via ORAL
  Filled 2018-11-23 (×9): qty 1

## 2018-11-23 MED ORDER — SORBITOL 70 % SOLN: 30.0000 mL | Freq: Every day | Status: DC | PRN

## 2018-11-23 MED ORDER — SODIUM CHLORIDE 0.9 % IV SOLN
1000.0000 mL | INTRAVENOUS | Status: DC
  Administered 2018-11-23 – 2018-11-24 (×3): 1000 mL via INTRAVENOUS

## 2018-11-23 MED ORDER — ALBUTEROL SULFATE (2.5 MG/3ML) 0.083% IN NEBU: 2.5000 mg | INHALATION_SOLUTION | RESPIRATORY_TRACT | Status: DC | PRN

## 2018-11-23 MED ORDER — SODIUM CHLORIDE (PF) 0.9 % IJ SOLN
INTRAMUSCULAR | Status: AC
  Filled 2018-11-23: qty 50

## 2018-11-23 MED ORDER — POLYETHYLENE GLYCOL 3350 17 G PO PACK
17.0000 g | PACK | Freq: Two times a day (BID) | ORAL | Status: DC
  Administered 2018-11-23 – 2018-11-28 (×9): 17 g via ORAL
  Filled 2018-11-23 (×9): qty 1

## 2018-11-23 MED ORDER — LACTULOSE 10 GM/15ML PO SOLN: 20.0000 g | Freq: Every day | ORAL | Status: DC | PRN

## 2018-11-23 MED ORDER — METRONIDAZOLE IN NACL 5-0.79 MG/ML-% IV SOLN
500.0000 mg | Freq: Once | INTRAVENOUS | Status: AC
  Administered 2018-11-23: 500 mg via INTRAVENOUS
  Filled 2018-11-23: qty 100

## 2018-11-23 MED ORDER — INSULIN ASPART 100 UNIT/ML ~~LOC~~ SOLN
0.0000 [IU] | Freq: Three times a day (TID) | SUBCUTANEOUS | Status: DC
  Administered 2018-11-25 – 2018-11-26 (×2): 1 [IU] via SUBCUTANEOUS

## 2018-11-23 MED ORDER — ONDANSETRON HCL 4 MG PO TABS: 4.0000 mg | ORAL_TABLET | Freq: Four times a day (QID) | ORAL | Status: DC | PRN

## 2018-11-23 MED ORDER — MUPIROCIN 2 % EX OINT
1.0000 "application " | TOPICAL_OINTMENT | Freq: Two times a day (BID) | CUTANEOUS | Status: AC
  Administered 2018-11-23 – 2018-11-28 (×10): 1 via NASAL
  Filled 2018-11-23 (×3): qty 22

## 2018-11-23 MED ORDER — CHLORHEXIDINE GLUCONATE CLOTH 2 % EX PADS
6.0000 | MEDICATED_PAD | Freq: Every day | CUTANEOUS | Status: DC
  Administered 2018-11-24 – 2018-11-28 (×5): 6 via TOPICAL

## 2018-11-23 MED ORDER — LEVETIRACETAM 100 MG/ML PO SOLN
750.0000 mg | Freq: Two times a day (BID) | ORAL | Status: DC
  Administered 2018-11-23 – 2018-11-28 (×10): 750 mg via ORAL
  Filled 2018-11-23 (×11): qty 7.5

## 2018-11-23 MED ORDER — DONEPEZIL HCL 10 MG PO TABS
10.0000 mg | ORAL_TABLET | Freq: Every day | ORAL | Status: DC
  Administered 2018-11-23 – 2018-11-28 (×5): 10 mg via ORAL
  Filled 2018-11-23 (×5): qty 1

## 2018-11-23 MED ORDER — IPRATROPIUM-ALBUTEROL 0.5-2.5 (3) MG/3ML IN SOLN: 3.0000 mL | Freq: Four times a day (QID) | RESPIRATORY_TRACT | Status: DC | PRN

## 2018-11-23 MED ORDER — PRO-STAT SUGAR FREE PO LIQD
30.0000 mL | Freq: Two times a day (BID) | ORAL | Status: DC
  Administered 2018-11-23 – 2018-11-26 (×6): 30 mL via ORAL
  Filled 2018-11-23 (×6): qty 30

## 2018-11-23 MED ORDER — ACETAMINOPHEN 650 MG RE SUPP: 650.0000 mg | Freq: Four times a day (QID) | RECTAL | Status: DC | PRN

## 2018-11-23 MED ORDER — ENOXAPARIN SODIUM 40 MG/0.4ML ~~LOC~~ SOLN
40.0000 mg | SUBCUTANEOUS | Status: DC
  Administered 2018-11-23 – 2018-11-27 (×5): 40 mg via SUBCUTANEOUS
  Filled 2018-11-23 (×5): qty 0.4

## 2018-11-23 MED ORDER — VALPROIC ACID 250 MG/5ML PO SOLN
125.0000 mg | Freq: Two times a day (BID) | ORAL | Status: DC
  Administered 2018-11-23 – 2018-11-28 (×10): 125 mg
  Filled 2018-11-23 (×11): qty 5

## 2018-11-23 MED ORDER — SODIUM CHLORIDE 0.9% FLUSH: 10.0000 mL | INTRAVENOUS | Status: DC | PRN

## 2018-11-23 MED ORDER — TRAMADOL HCL 50 MG PO TABS: 50.0000 mg | ORAL_TABLET | Freq: Four times a day (QID) | ORAL | Status: DC | PRN

## 2018-11-23 MED ORDER — IOHEXOL 300 MG/ML  SOLN
100.0000 mL | Freq: Once | INTRAMUSCULAR | Status: AC | PRN
  Administered 2018-11-23: 100 mL via INTRAVENOUS

## 2018-11-23 MED ORDER — VANCOMYCIN HCL IN DEXTROSE 1-5 GM/200ML-% IV SOLN
1000.0000 mg | Freq: Two times a day (BID) | INTRAVENOUS | Status: DC
  Administered 2018-11-23 – 2018-11-26 (×7): 1000 mg via INTRAVENOUS
  Filled 2018-11-23 (×8): qty 200

## 2018-11-23 MED ORDER — ONDANSETRON HCL 4 MG/2ML IJ SOLN: 4.0000 mg | Freq: Four times a day (QID) | INTRAMUSCULAR | Status: DC | PRN

## 2018-11-23 MED ORDER — METRONIDAZOLE IN NACL 5-0.79 MG/ML-% IV SOLN
500.0000 mg | Freq: Three times a day (TID) | INTRAVENOUS | Status: DC
  Administered 2018-11-23 – 2018-11-26 (×9): 500 mg via INTRAVENOUS
  Filled 2018-11-23 (×9): qty 100

## 2018-11-23 MED ORDER — ACETAMINOPHEN 325 MG PO TABS
650.0000 mg | ORAL_TABLET | Freq: Four times a day (QID) | ORAL | Status: DC | PRN
  Administered 2018-11-24: 650 mg via ORAL
  Filled 2018-11-23: qty 2

## 2018-11-23 MED ORDER — VANCOMYCIN HCL IN DEXTROSE 1-5 GM/200ML-% IV SOLN: 1000.0000 mg | Freq: Once | INTRAVENOUS | Status: DC

## 2018-11-23 MED ORDER — ASPIRIN 81 MG PO CHEW
81.0000 mg | CHEWABLE_TABLET | Freq: Every day | ORAL | Status: DC
  Administered 2018-11-24 – 2018-11-28 (×5): 81 mg via ORAL
  Filled 2018-11-23 (×5): qty 1

## 2018-11-23 MED ORDER — SODIUM CHLORIDE 0.9 % IV SOLN
2.0000 g | Freq: Three times a day (TID) | INTRAVENOUS | Status: DC
  Administered 2018-11-23 – 2018-11-26 (×9): 2 g via INTRAVENOUS
  Filled 2018-11-23 (×10): qty 2

## 2018-11-23 MED ORDER — SODIUM CHLORIDE 0.9 % IV SOLN
2.0000 g | Freq: Once | INTRAVENOUS | Status: AC
  Administered 2018-11-23: 2 g via INTRAVENOUS
  Filled 2018-11-23: qty 2

## 2018-11-23 MED ORDER — VANCOMYCIN HCL 10 G IV SOLR: 1250.0000 mg | Freq: Two times a day (BID) | INTRAVENOUS | Status: DC

## 2018-11-23 MED ORDER — SODIUM CHLORIDE 0.9 % IV BOLUS
500.0000 mL | Freq: Once | INTRAVENOUS | Status: AC
  Administered 2018-11-23: 500 mL via INTRAVENOUS

## 2018-11-23 NOTE — Progress Notes (Signed)
Pharmacy Antibiotic Note  Joshua Gonzales is a 78 y.o. male admitted on 11/23/2018 with osteomyelitis.  Pharmacy has been consulted to add cefepime to vanc dosing we have already done see previous note.  Plan: Cefepime 2g IV q8  Weight: 160 lb 15 oz (73 kg)  Temp (24hrs), Avg:99.4 F (37.4 C), Min:98.9 F (37.2 C), Max:99.9 F (37.7 C)  Recent Labs  Lab 11/23/18 0652 11/23/18 0911  WBC 7.5  --   CREATININE 0.66  --   LATICACIDVEN 1.5 1.4  VANCORANDOM  --  23    Estimated Creatinine Clearance: 82.4 mL/min (by C-G formula based on SCr of 0.66 mg/dL).    No Known Allergies  Thank you for allowing pharmacy to be a part of this patient's care.  Kara Mead 11/23/2018 1:05 PM

## 2018-11-23 NOTE — ED Notes (Signed)
Hospitalist at bedside 

## 2018-11-23 NOTE — ED Triage Notes (Signed)
Pt comes to ed via ems, from [facility], last know well known, pt with dementia and AMS, possible septic. Pt is DNR, with a right midline 13 days ago last re bandage ( 9/12 ). Pt has G tube and pressure sores on the sacrum. In creased altered mental status per facility staff,not talking as much. Pt was on antibiotics for Uti recently per paper work.  ( vancomycin)  V/s on arrival are 180/120, rr32, cbg 120, etco2 33, axillary temp 101.0 hr 110.  Sinus tach on monitor.

## 2018-11-23 NOTE — Progress Notes (Signed)
PT Cancellation Note  Patient Details Name: Joshua Gonzales MRN: [id number] DOB: 05-13-1940   Cancelled Treatment:    Reason Eval/Treat Not Completed: PT screened, no needs identified, will sign off, patient has significant contractures of legs, sacral osteo, bedbound and  Resides in  SNF. Skilled PT in acute care is not indicated at this time.[place] Pager [phone] Office [phone]    Claretha Cooper 11/23/2018, 2:21 PM

## 2018-11-23 NOTE — ED Notes (Signed)
Patient is aware that urine sample is needed. Urinal is at bedside. Patient will call out when he has to urinate.

## 2018-11-23 NOTE — Consult Note (Addendum)
[facility]    Date of Admission:  11/23/2018   Total days of antibiotics: 13 vanco               Reason for Consult: Fever    Referring Provider: Buelah Manis   Assessment: Mental Status change Sacral Osteomyelitis, decubitus ulcer PIC line From SNF  Plan: 1. Would pull PIC 2. Line holiday 3. Replace in new site 4. Await BCx and UCx 5. Agree with expanded coverage while we await BCx (cefepime, falgyl added) 6. Bowel hygeine 7. Wound care eval 8.  COVID test is pending. He is from a SNF with multiple + cases. He is asx and has a clear CXR.   Thank you so much for this interesting consult,  Principal Problem:   Acute metabolic encephalopathy Active Problems:   Essential hypertension, benign   History of seizures   Dementia with behavioral disturbance (HCC)   PAF (paroxysmal atrial fibrillation) (HCC)   Type 2 diabetes mellitus without complication (HCC)   History of CVA with residual deficit   FTT (failure to thrive) in adult   Cellulitis   Osteomyelitis (HCC)   Constipation    [START ON 11/24/2018] aspirin  81 mg Oral Daily   Chlorhexidine Gluconate Cloth  6 each Topical Daily   donepezil  10 mg Oral QHS   enoxaparin (LOVENOX) injection  40 mg Subcutaneous Q24H   feeding supplement (PRO-STAT SUGAR FREE 64)  30 mL Oral BID BM   levETIRAcetam  750 mg Oral BID   polyethylene glycol  17 g Oral BID   senna  1 tablet Oral BID   sodium chloride (PF)       valproic acid  125 mg Per Tube BID    HPI: Joshua Gonzales is a 78 y.o. male with hx of DM, alzheimer's dementia,  Brought in by his SNF [facility]) on 9-18 for worsening of his mental status. In ED he was not able to provide hx.  His WBC was normal and he was afebrile.  He had repeat imaging of his decubitus ulcer (CT) which showed stable ulcer. Large amt of intra-abd stool.   His hx is also notable for a midline sacrococcygeal decubitus ulcer with  underlying osteo on MRI. He was seen in ID clinic 9-14 after he had been started (9-6?) on a 4 week course of vancomycin.  His plan was to continue on vanco for (6 weeks) and then convert to oral doxy.   Review of Systems: Review of Systems  Unable to perform ROS: Mental status change    Past Medical History:  Diagnosis Date   Alzheimer disease (HCC)    Hypertension    Seizures (HCC)     Social History   Tobacco Use   Smoking status: Former Smoker  Substance Use Topics   Alcohol use: Yes   Drug use: Not on file    Family History  Problem Relation Age of Onset   Hyperlipidemia Mother    Hypertension Mother    Hypertension Father    Hyperlipidemia Father      Medications:  Scheduled:  [START ON 11/24/2018] aspirin  81 mg Oral Daily   Chlorhexidine Gluconate Cloth  6 each Topical Daily   donepezil  10 mg Oral QHS   enoxaparin (LOVENOX) injection  40 mg Subcutaneous Q24H   feeding supplement (PRO-STAT SUGAR FREE 64)  30 mL Oral BID BM   levETIRAcetam  750 mg Oral  BID   polyethylene glycol  17 g Oral BID   senna  1 tablet Oral BID   sodium chloride (PF)       valproic acid  125 mg Per Tube BID    Abtx:  Anti-infectives (From admission, onward)   Start     Dose/Rate Route Frequency Ordered Stop   11/23/18 1600  metroNIDAZOLE (FLAGYL) IVPB 500 mg     500 mg 100 mL/hr over 60 Minutes Intravenous Every 8 hours 11/23/18 1343     11/23/18 1400  ceFEPIme (MAXIPIME) 2 g in sodium chloride 0.9 % 100 mL IVPB     2 g 200 mL/hr over 30 Minutes Intravenous Every 8 hours 11/23/18 1307     11/23/18 1345  vancomycin (VANCOCIN) 1,250 mg in sodium chloride 0.9 % 250 mL IVPB  Status:  Discontinued     1,250 mg 166.7 mL/hr over 90 Minutes Intravenous Every 12 hours 11/23/18 1343 11/23/18 1343   11/23/18 1300  vancomycin (VANCOCIN) IVPB 1000 mg/200 mL premix     1,000 mg 200 mL/hr over 60 Minutes Intravenous Every 12 hours 11/23/18 0955     11/23/18 0630   ceFEPIme (MAXIPIME) 2 g in sodium chloride 0.9 % 100 mL IVPB     2 g 200 mL/hr over 30 Minutes Intravenous  Once 11/23/18 0623 11/23/18 1115   11/23/18 0630  metroNIDAZOLE (FLAGYL) IVPB 500 mg     500 mg 100 mL/hr over 60 Minutes Intravenous  Once 11/23/18 0623 11/23/18 1116   11/23/18 0630  vancomycin (VANCOCIN) IVPB 1000 mg/200 mL premix  Status:  Discontinued     1,000 mg 200 mL/hr over 60 Minutes Intravenous  Once 11/23/18 0623 11/23/18 [id number]        OBJECTIVE: Blood pressure (!) 142/89, pulse 85, temperature 98.4 F (36.9 C), temperature source Oral, resp. rate (!) 25, weight 73 kg, SpO2 99 %.  Physical Exam Constitutional:      General: He is not in acute distress.    Appearance: He is not toxic-appearing.  Eyes:     Extraocular Movements: Extraocular movements intact.  Neck:     Musculoskeletal: Neck supple.  Cardiovascular:     Rate and Rhythm: Normal rate and regular rhythm.  Pulmonary:     Effort: Pulmonary effort is normal.     Breath sounds: Wheezing present.  Abdominal:     General: Bowel sounds are normal. There is no distension.     Palpations: Abdomen is soft.     Tenderness: There is no abdominal tenderness.  Musculoskeletal:     Right lower leg: No edema.     Left lower leg: No edema.  Skin:        Media Information    Document Information  Photos    11/23/2018 15:11  Attached To:  Hospital Encounter on 11/23/18  Source Information  Ilene Witcher, Lacretia LeighJeffrey C, MD   [facility]     Lab Results Results for orders placed or performed during the hospital encounter of 11/23/18 (from the past 48 hour(s))  Lactic acid, plasma     Status: None   Collection Time: 11/23/18  6:52 AM  Result Value Ref Range   Lactic Acid, Venous 1.5 0.5 - 1.9 mmol/L    Comment: Performed at [facility], [address] [place], [place] [id number]  Comprehensive metabolic panel     Status: Abnormal   Collection Time: 11/23/18  6:52 AM  Result  Value Ref Range   Sodium 140 135 -  145 mmol/L   Potassium 4.3 3.5 - 5.1 mmol/L   Chloride 102 98 - 111 mmol/L   CO2 26 22 - 32 mmol/L   Glucose, Bld 114 (H) 70 - 99 mg/dL   BUN 19 8 - 23 mg/dL   Creatinine, Ser 8.65 0.61 - 1.24 mg/dL   Calcium 9.4 8.9 - 78.4 mg/dL   Total Protein 8.1 6.5 - 8.1 g/dL   Albumin 3.0 (L) 3.5 - 5.0 g/dL   AST 29 15 - 41 U/L   ALT 24 0 - 44 U/L   Alkaline Phosphatase 75 38 - 126 U/L   Total Bilirubin 0.3 0.3 - 1.2 mg/dL   GFR calc non Af Amer >60 >60 mL/min   GFR calc Af Amer >60 >60 mL/min   Anion gap 12 5 - 15    Comment: Performed at [facility], [address], [place], [place] [address]  CBC WITH DIFFERENTIAL     Status: Abnormal   Collection Time: 11/23/18  6:52 AM  Result Value Ref Range   WBC 7.5 4.0 - 10.5 K/uL   RBC 4.02 (L) 4.22 - 5.81 MIL/uL   Hemoglobin 12.6 (L) 13.0 - 17.0 g/dL   HCT 52.8 41.3 - 24.4 %   MCV 99.3 80.0 - 100.0 fL   MCH 31.3 26.0 - 34.0 pg   MCHC 31.6 30.0 - 36.0 g/dL   RDW 01.0 (H) 27.2 - 53.6 %   Platelets 245 150 - 400 K/uL   nRBC 0.0 0.0 - 0.2 %   Neutrophils Relative % 63 %   Neutro Abs 4.8 1.7 - 7.7 K/uL   Lymphocytes Relative 24 %   Lymphs Abs 1.8 0.7 - 4.0 K/uL   Monocytes Relative 7 %   Monocytes Absolute 0.5 0.1 - 1.0 K/uL   Eosinophils Relative 5 %   Eosinophils Absolute 0.4 0.0 - 0.5 K/uL   Basophils Relative 1 %   Basophils Absolute 0.1 0.0 - 0.1 K/uL   Immature Granulocytes 0 %   Abs Immature Granulocytes 0.02 0.00 - 0.07 K/uL    Comment: Performed at [facility], [address], [place], [place] [address]  APTT     Status: None   Collection Time: 11/23/18  6:52 AM  Result Value Ref Range   aPTT 35 24 - 36 seconds    Comment: Performed at [facility], [address], [place], [place] [address]  Protime-INR     Status: None   Collection Time: 11/23/18  6:52 AM  Result Value Ref Range   Prothrombin Time 14.3 11.4 - 15.2 seconds   INR 1.1  0.8 - 1.2    Comment: (NOTE) INR goal varies based on device and disease states. Performed at [facility], [address], [place], [place] [address]   Ammonia     Status: None   Collection Time: 11/23/18  6:54 AM  Result Value Ref Range   Ammonia 27 9 - 35 umol/L    Comment: Performed at [facility], [address] [place], [place] [address]  Urinalysis, Routine w reflex microscopic     Status: Abnormal   Collection Time: 11/23/18  8:59 AM  Result Value Ref Range   Color, Urine YELLOW YELLOW   APPearance CLEAR CLEAR   Specific Gravity, Urine 1.034 (H) 1.005 - 1.030   pH 5.0 5.0 - 8.0   Glucose, UA NEGATIVE NEGATIVE mg/dL   Hgb urine dipstick NEGATIVE NEGATIVE   Bilirubin  Urine NEGATIVE NEGATIVE   Ketones, ur NEGATIVE NEGATIVE mg/dL   Protein, ur NEGATIVE NEGATIVE mg/dL   Nitrite NEGATIVE NEGATIVE   Leukocytes,Ua NEGATIVE NEGATIVE   RBC / HPF 0-5 0 - 5 RBC/hpf   WBC, UA 0-5 0 - 5 WBC/hpf   Bacteria, UA NEGATIVE (A) NONE SEEN   Mucus PRESENT    Hyaline Casts, UA PRESENT     Comment: Performed at [facility], [place] [address], [place], [place] [address]  Lactic acid, plasma     Status: None   Collection Time: 11/23/18  9:11 AM  Result Value Ref Range   Lactic Acid, Venous 1.4 0.5 - 1.9 mmol/L    Comment: Performed at [facility], [place] [address], [place], [place] [address]  Vancomycin, random     Status: None   Collection Time: 11/23/18  9:11 AM  Result Value Ref Range   Vancomycin Rm 23     Comment:        Random Vancomycin therapeutic range is dependent on dosage and time of specimen collection. A peak range is 20.0-40.0 ug/mL A trough range is 5.0-15.0 ug/mL        Performed at [facility] [address] [place], [place] [address]   Magnesium     Status: None   Collection Time: 11/23/18 11:00 AM  Result Value Ref Range   Magnesium 1.9 1.7 - 2.4 mg/dL    Comment:  Performed at [facility], [place] [address] [place], [place] [address]      Component Value Date/Time   SDES BLOOD RIGHT HAND 06/03/2014 1713   SDES BLOOD LEFT ARM 06/03/2014 1713   SPECREQUEST BOTTLES DRAWN AEROBIC AND ANAEROBIC 3ML 06/03/2014 1713   SPECREQUEST BOTTLES DRAWN AEROBIC AND ANAEROBIC 3ML 06/03/2014 1713   CULT  06/03/2014 1713    NO GROWTH 5 DAYS Note: Culture results may be compromised due to an inadequate volume of blood received in culture bottles. Performed at [facility]  06/03/2014 1713    NO GROWTH 5 DAYS Note: Culture results may be compromised due to an inadequate volume of blood received in culture bottles. Performed at [facility] 06/10/2014 FINAL 06/03/2014 1713   REPTSTATUS 06/10/2014 FINAL 06/03/2014 1713   Ct Head Wo Contrast  Result Date: 11/23/2018 CLINICAL DATA:  Altered mental status, encephalopathy. EXAM: CT HEAD WITHOUT CONTRAST TECHNIQUE: Contiguous axial images were obtained from the base of the skull through the vertex without intravenous contrast. COMPARISON:  Head CT dated 08/16/2018. FINDINGS: Brain: Generalized parenchymal volume loss with commensurate dilatation of the ventricles and sulci. 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 Also again noted are chronic small vessel ischemic changes within the bilateral basal ganglia regions. There is no mass, hemorrhage, edema or other evidence of acute parenchymal abnormality. No extra-axial hemorrhage. Vascular: Chronic calcified atherosclerotic changes of the large vessels at the skull base. No unexpected hyperdense vessel. Skull: Normal. Negative for fracture or focal lesion. Sinuses/Orbits: No acute finding. Other: None. IMPRESSION: 1. No acute findings. No intracranial mass,  hemorrhage or edema. 2. Chronic ischemic changes, as detailed above. Electronically Signed   By: Franki Cabot M.D.   On: 11/23/2018 12:58   Ct Abdomen Pelvis W Contrast  Result Date: 11/23/2018 CLINICAL DATA:  Abdominal distension. Altered  mental status. History of sacral wounds. EXAM: CT ABDOMEN AND PELVIS WITH CONTRAST TECHNIQUE: Multidetector CT imaging of the abdomen and pelvis was performed using the standard protocol following bolus administration of intravenous contrast. CONTRAST:  100mL OMNIPAQUE IOHEXOL 300 MG/ML  SOLN COMPARISON:  MRI sacrum dated 11/05/2018. CT abdomen dated 09/17/2018 FINDINGS: Lower chest: Advanced chronic bibasilar interstitial fibrosis with associated bullous change and honeycombing. Hepatobiliary: No focal liver abnormality is seen. No gallstones, gallbladder wall thickening, or biliary dilatation. Pancreas: Unremarkable. No pancreatic ductal dilatation or surrounding inflammatory changes. Spleen: Normal in size without focal abnormality. Adrenals/Urinary Tract: Adrenal glands appear normal. Hypodense foci within both kidneys, too small to definitively characterize but most likely cysts. Additional scarring within the LEFT renal cortex. No suspicious mass, stone or hydronephrosis identified. No ureteral or bladder calculi identified. Bladder is unremarkable. Stomach/Bowel: No dilated large or small bowel loops. Fairly large amount of stool throughout the nondistended colon. No evidence of bowel wall inflammation seen. Appendix is normal. Gastrostomy tube appears appropriately positioned. Vascular/Lymphatic: Aortic atherosclerosis. 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 No acute appearing vascular abnormality. No enlarged lymph nodes seen in the abdomen or pelvis. Reproductive: Prostate is unremarkable. Other: Sacral decubitus ulcer appear stable compared to the recent MRI, with presumed packing material.  Ill-defined fluid stranding/edema within the presacral space is likely related and is also stable compared to the earlier MRI. Underlying osteomyelitis of the sacrum better demonstrated on the earlier MRI. Musculoskeletal: No other acute or suspicious osseous findings. Degenerative spondylosis throughout the slightly scoliotic thoracolumbar spine, mild to moderate in degree. Ill-defined edema within the superficial subcutaneous soft tissues indicating mild anasarca. IMPRESSION: 1. Fairly large amount of stool throughout the nondistended colon (constipation? ). No evidence of bowel obstruction or evidence of bowel wall inflammation. No free fluid or abscess collection. No free intraperitoneal air. 2. Sacral decubitus ulcer appear stable compared to the recent MRI, with presumed packing material. Ill-defined fluid stranding/edema within the presacral space is also stable compared to the earlier MRI. Underlying osteomyelitis of the sacrum better demonstrated on the earlier MRI. 3. Aortic atherosclerosis. 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 4. Advanced chronic bibasilar interstitial fibrosis with associated bullous change and honeycombing. 5. Additional chronic/incidental findings detailed Aortic Atherosclerosis (ICD10-I70.0). Electronically Signed   By: Bary RichardStan  Maynard M.D.   On: 11/23/2018 09:30   Dg Chest Port 1 View  Addendum Date: 11/23/2018   ADDENDUM REPORT: 11/23/2018 12:40 ADDENDUM: RIGHT-sided PICC line appears well positioned with tip at the level of the mid/lower SVC. Electronically Signed   By: Bary RichardStan  Maynard M.D.   On: 11/23/2018 12:40   Result Date: 11/23/2018 CLINICAL DATA:  [facility] Pt has increased AMS, possible sepsis. Pt has pressure sores on the sacrum. Pt was on antibiotics for UTI recently. Medical hx of Alzheimer's and HTN. EXAM: PORTABLE CHEST 1 VIEW COMPARISON:  08/16/2018 and older exams.  FINDINGS: Cardiac silhouette is normal in size. No mediastinal or hilar masses. Linear areas of scarring noted in the inferior right upper lobe adjacent to the minor fissure and left mid to lower lung. Prominent interstitial markings noted at the lung bases. These findings are stable. No evidence of pneumonia or pulmonary edema. No pleural effusion or pneumothorax. Skeletal structures are demineralized but grossly intact. IMPRESSION: 1. No acute cardiopulmonary disease. Electronically Signed: By: Amie Portlandavid  Ormond M.D. On: 11/23/2018 07:28  No results found for this or any previous visit (from the past 240 hour(s)).  Microbiology: No results found for this or any previous visit (from the past 240 hour(s)).  Radiographs and labs were personally reviewed by me.   Johny Sax, MD [facility] Infectious Disease [facility] [phone] 11/23/2018, 2:52 PM

## 2018-11-23 NOTE — Progress Notes (Signed)
A consult was received from an ED physician for cefepime and vancomycin per pharmacy dosing.  The patient's profile has been reviewed for ht/wt/allergies/indication/available labs.   A one time order has been placed for cefepime 2 Gm.  Further antibiotics/pharmacy consults should be ordered by admitting physician if indicated.        Per Dr Baxter Flattery  Note 9/14 patient is on Vancomycin for 6 wk for sacral osteomyelitis. Trying to obtain records about when LD was given.                 Thank you, Dorrene German 11/23/2018  6:34 AM

## 2018-11-23 NOTE — Progress Notes (Signed)
OT Cancellation Note  Patient Details Name: Joshua Gonzales MRN: [id number] DOB: 27-Mar-1940   Cancelled Treatment:    Reason Eval/Treat Not Completed: OT screened, no needs identified, will sign off.  Note pt from SNF, bedbound and has contractures. Will sign off.   [place] 11/23/2018, 2:38 PM  Lesle Chris, OTR/L Acute Rehabilitation Services [phone] [facility] pager [phone] office 11/23/2018

## 2018-11-23 NOTE — Progress Notes (Signed)
IV team nurse came to assess PICC line from facility. Upon inspection, dressing is soiled and there was no biopatch. Dressing removed and there is slight redness around the insertion site. Dr. Grandville Silos in room at the time and noted the redness. Site cleaned and new dressing applied with biopatch and secureport per protocol. Unable to find in patient's medical record where the PICC line was placed orginally. Called radiology to add addendum to determine where the PICC line terminates. RN aware not to use line at this time. Patient does have one other PIV access. Per RN, PICC line flushes but does not draw back blood. If PICC line with correct placement, will assess line and use alteplase if needed.

## 2018-11-23 NOTE — Progress Notes (Signed)
Pharmacy Antibiotic Note  Joshua Gonzales is a 78 y.o. 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 Dr. Storm Frisk note on 9/14 indicated that plan is to treat with vancomycin for 6 weeks, then covert to oral doxycycline.  Not certain when his IV abx was initiated and the stop date for it is.  He presented to the ED on 9/19 with AMS. Pharmacy is consulted to resume vancomycin therapy for patient.  Per nursing home records:  - vancomycin dose was increased to 1250 mg IV q12h on 9/15 and he was getting it at 0900 and 2100. Last vancomycing dose received at nursing facility was at 2100 on 9/18. - vancomycin level drawn on 9/18 at 1357 was 20.6 with dose given at 0900 (random level)  Today, 11/23/2018: - scr 0.66 - vancomycin trough level = 23 (goal 15-20)  Plan: - adjust vancomycin dose to 1000 mg IV q12h with next dose due at 1PM today _________________________________________  Temp (24hrs), Avg:99.4 F (37.4 C), Min:98.9 F (37.2 C), Max:99.9 F (37.7 C)  Recent Labs  Lab 11/23/18 0652  WBC 7.5  CREATININE 0.66  LATICACIDVEN 1.5    CrCl cannot be calculated (Unknown ideal weight.).    No Known Allergies   Thank you for allowing pharmacy to be a part of this patient's care.  Lynelle Doctor 11/23/2018 9:08 AM

## 2018-11-23 NOTE — ED Notes (Signed)
Patient transported to CT 

## 2018-11-23 NOTE — H&P (Addendum)
History and Physical    Joshua Gonzales ZOX:[id number] DOB: 06-Mar-1941 DOA: 11/23/2018  PCP: [name], MD Patient coming from: [facility]  I have personally briefly reviewed patient's old medical records in [facility] Link  Chief Complaint: Worsening confusion  HPI: Joshua Gonzales is a 78 y.o. male resident I have [facility] skilled nursing facility, with medical history significant of Alzheimer's dementia, hypertension, CVA with residual deficits, neurosyphilis, seizure disorder who was transferred from [facility] nursing home due to worsening mental status via EMS.  Unable to obtain much history from patient due to dementia.  Patient keeps shaking his head saying no on questions are asked such as does he have any chest pain, shortness of breath, abdominal pain, nausea, vomiting, dysuria, melena, hematemesis, hematochezia, fever, chills.  Per ED physician's note patient noted to have persistent progressive altered mental status onset 1 day prior to admission per nursing home staff.  Patient also noted to have a right PICC line for which she has been receiving IV vancomycin for sacral osteomyelitis.  Also noted was that patient did have history of recurrent UTIs.  ED Course: Patient seen in the ED, comprehensive metabolic profile unremarkable.  CBC with a hemoglobin of 12.6 otherwise unremarkable.  Urinalysis nitrite negative leukocytes negative specific gravity 1.034, negative for bacteria, 0-5 WBCs.  Chest x-ray done with no acute cardiopulmonary disease.  PICC line in place.  CT abdomen and pelvis which was done showed stable findings of sacral osteomyelitis with some ill-defined fluid stranding edema as well which was seen on prior MRI.  ED physician discussed with Dr. Ninetta Lights of ID who recommended broadening antibiotic coverage with addition of cefepime and Flagyl, pending cultures and patient to be admitted for further evaluation.  ED physician was concerned of some ongoing  cellulitis around sacral osteomyelitis site.  Review of Systems: As per HPI otherwise 10 point review of systems negative.   Past Medical History:  Diagnosis Date   Alzheimer disease (HCC)    Hypertension    Seizures (HCC)     Past Surgical History:  Procedure Laterality Date   IR GASTROSTOMY TUBE MOD SED  09/30/2018     reports that he has quit smoking. He does not have any smokeless tobacco history on file. He reports current alcohol use. No history on file for drug.  No Known Allergies  Family History  Problem Relation Age of Onset   Hyperlipidemia Mother    Hypertension Mother    Hypertension Father    Hyperlipidemia Father    Family history reviewed and not pertinent at this time.  Unable to ask patient family history questions due to his mental status.   Prior to Admission medications   Medication Sig Start Date End Date Taking? Authorizing Provider  Amino Acids-Protein Hydrolys (FEEDING SUPPLEMENT, PRO-STAT SUGAR FREE 64,) LIQD Take 30 mLs by mouth 2 (two) times a day.   Yes [provider]  aspirin 81 MG chewable tablet Chew 81 mg by mouth daily.   Yes [provider]  donepezil (ARICEPT) 10 MG tablet Take 10 mg by mouth at bedtime.   Yes [provider]  HYDROcodone-acetaminophen (NORCO/VICODIN) 5-325 MG tablet Take 1 tablet by mouth 2 (two) times daily.   Yes [provider]  ipratropium-albuterol (DUONEB) 0.5-2.5 (3) MG/3ML SOLN Take 3 mLs by nebulization every 6 (six) hours as needed (for shortness of breath or wheezing).   Yes [provider]  lactulose (CHRONULAC) 10 GM/15ML solution Take 20 g  by mouth daily as needed for moderate constipation.    Yes [provider]  levETIRAcetam (KEPPRA) 100 MG/ML solution Take 750 mg by mouth 2 (two) times daily.   Yes [provider]  losartan (COZAAR) 50 MG tablet Take 50 mg by mouth daily.   Yes [provider]  Multiple Vitamins-Minerals  (CERTA-VITE PO) Take 1 tablet by mouth daily.   Yes [provider]  valproic acid (DEPAKENE) 250 MG/5ML SOLN solution Place 125 mg into feeding tube 2 (two) times daily.  11/09/18  Yes [provider]  vancomycin 1,250 mg in sodium chloride 0.9 % 250 mL Inject 1,250 mg into the vein every 12 (twelve) hours.   Yes [provider]  vancomycin (VANCOCIN) 1 g injection 1,000 mg by Other route every 12 (twelve) hours. Inject 1g into the vein every 12 hours 11/17/18   [provider]    Physical Exam: Vitals:   11/23/18 1100 11/23/18 1130 11/23/18 1200 11/23/18 1230  BP: (!) 141/90 137/83 (!) 141/99 (!) 134/94  Pulse: 86 94 87 81  Resp: (!) 24 18 (!) 25 18  Temp:      TempSrc:      SpO2: 94% 94% 95% 97%    Constitutional: Drowsy.  Barely opens eyes to verbal stimuli. Vitals:   11/23/18 1100 11/23/18 1130 11/23/18 1200 11/23/18 1230  BP: (!) 141/90 137/83 (!) 141/99 (!) 134/94  Pulse: 86 94 87 81  Resp: (!) 24 18 (!) 25 18  Temp:      TempSrc:      SpO2: 94% 94% 95% 97%   Eyes: PERRLA, lids and conjunctivae normal ENMT: Mucous membranes are dry. Posterior pharynx clear of any exudate or lesions.Normal dentition.  Neck: normal, supple, no masses, no thyromegaly Respiratory: Some expiratory wheezing anterior lung fields.  No crackles, no rhonchi.  No use of accessory muscles of respiration.   Cardiovascular: Regular rate and rhythm, no murmurs / rubs / gallops. No extremity edema. 2+ pedal pulses. No carotid bruits.  Abdomen: no tenderness, no masses palpated. No hepatosplenomegaly. Bowel sounds positive.  PEG tube intact. Musculoskeletal: no clubbing / cyanosis. No joint deformity upper and lower extremities. Good ROM, no contractures. Normal muscle tone.  Skin: Right upper extremity with PICC line with some slight surrounding erythema around the PICC line insertion site.  Patient with a midline sacrococcygeal decubitus ulcer with slight erythema  surrounding area with packing noted, no fluctuance noted. Neurologic: CN 2-12 grossly intact.  Moving extremities spontaneously.   Psychiatric: Unable to assess judgment and insight due to current mental status.  Unable to assess mood.   Labs on Admission: I have personally reviewed following labs and imaging studies  CBC: Recent Labs  Lab 11/23/18 0652  WBC 7.5  NEUTROABS 4.8  HGB 12.6*  HCT 39.9  MCV 99.3  PLT 563   Basic Metabolic Panel: Recent Labs  Lab 11/23/18 0652  NA 140  K 4.3  CL 102  CO2 26  GLUCOSE 114*  BUN 19  CREATININE 0.66  CALCIUM 9.4   GFR: CrCl cannot be calculated (Unknown ideal weight.). Liver Function Tests: Recent Labs  Lab 11/23/18 0652  AST 29  ALT 24  ALKPHOS 75  BILITOT 0.3  PROT 8.1  ALBUMIN 3.0*   No results for input(s): LIPASE, AMYLASE in the last 168 hours. Recent Labs  Lab 11/23/18 0654  AMMONIA 27   Coagulation Profile: Recent Labs  Lab 11/23/18 0652  INR 1.1   Cardiac Enzymes: No  results for input(s): CKTOTAL, CKMB, CKMBINDEX, TROPONINI in the last 168 hours. BNP (last 3 results) No results for input(s): PROBNP in the last 8760 hours. HbA1C: No results for input(s): HGBA1C in the last 72 hours. CBG: No results for input(s): GLUCAP in the last 168 hours. Lipid Profile: No results for input(s): CHOL, HDL, LDLCALC, TRIG, CHOLHDL, LDLDIRECT in the last 72 hours. Thyroid Function Tests: No results for input(s): TSH, T4TOTAL, FREET4, T3FREE, THYROIDAB in the last 72 hours. Anemia Panel: No results for input(s): VITAMINB12, FOLATE, FERRITIN, TIBC, IRON, RETICCTPCT in the last 72 hours. Urine analysis:    Component Value Date/Time   COLORURINE YELLOW 11/23/2018 0859   APPEARANCEUR CLEAR 11/23/2018 0859   LABSPEC 1.034 (H) 11/23/2018 0859   PHURINE 5.0 11/23/2018 0859   GLUCOSEU NEGATIVE 11/23/2018 0859   HGBUR NEGATIVE 11/23/2018 0859   BILIRUBINUR NEGATIVE 11/23/2018 0859   KETONESUR NEGATIVE 11/23/2018 0859     PROTEINUR NEGATIVE 11/23/2018 0859   UROBILINOGEN 0.2 06/03/2014 1649   NITRITE NEGATIVE 11/23/2018 0859   LEUKOCYTESUR NEGATIVE 11/23/2018 0859    Radiological Exams on Admission: Ct Abdomen Pelvis W Contrast  Result Date: 11/23/2018 CLINICAL DATA:  Abdominal distension. Altered mental status. History of sacral wounds. EXAM: CT ABDOMEN AND PELVIS WITH CONTRAST TECHNIQUE: Multidetector CT imaging of the abdomen and pelvis was performed using the standard protocol following bolus administration of intravenous contrast. CONTRAST:  100mL OMNIPAQUE IOHEXOL 300 MG/ML  SOLN COMPARISON:  MRI sacrum dated 11/05/2018. CT abdomen dated 09/17/2018 FINDINGS: Lower chest: Advanced chronic bibasilar interstitial fibrosis with associated bullous change and honeycombing. Hepatobiliary: No focal liver abnormality is seen. No gallstones, gallbladder wall thickening, or biliary dilatation. Pancreas: Unremarkable. No pancreatic ductal dilatation or surrounding inflammatory changes. Spleen: Normal in size without focal abnormality. Adrenals/Urinary Tract: Adrenal glands appear normal. Hypodense foci within both kidneys, too small to definitively characterize but most likely cysts. Additional scarring within the LEFT renal cortex. No suspicious mass, stone or hydronephrosis identified. No ureteral or bladder calculi identified. Bladder is unremarkable. Stomach/Bowel: No dilated large or small bowel loops. Fairly large amount of stool throughout the nondistended colon. No evidence of bowel wall inflammation seen. Appendix is normal. Gastrostomy tube appears appropriately positioned. Vascular/Lymphatic: Aortic atherosclerosis. 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 No acute appearing vascular abnormality. No enlarged lymph nodes seen in the abdomen or pelvis. Reproductive: Prostate is unremarkable. Other: Sacral decubitus ulcer appear stable compared to the  recent MRI, with presumed packing material. Ill-defined fluid stranding/edema within the presacral space is likely related and is also stable compared to the earlier MRI. Underlying osteomyelitis of the sacrum better demonstrated on the earlier MRI. Musculoskeletal: No other acute or suspicious osseous findings. Degenerative spondylosis throughout the slightly scoliotic thoracolumbar spine, mild to moderate in degree. Ill-defined edema within the superficial subcutaneous soft tissues indicating mild anasarca. IMPRESSION: 1. Fairly large amount of stool throughout the nondistended colon (constipation? ). No evidence of bowel obstruction or evidence of bowel wall inflammation. No free fluid or abscess collection. No free intraperitoneal air. 2. Sacral decubitus ulcer appear stable compared to the recent MRI, with presumed packing material. Ill-defined fluid stranding/edema within the presacral space is also stable compared to the earlier MRI. Underlying osteomyelitis of the sacrum better demonstrated on the earlier MRI. 3. Aortic atherosclerosis. 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 4. Advanced chronic bibasilar interstitial fibrosis with associated bullous change and honeycombing. 5. Additional  chronic/incidental findings detailed Aortic Atherosclerosis (ICD10-I70.0). Electronically Signed   By: Bary RichardStan  Maynard M.D.   On: 11/23/2018 09:30   Dg Chest Port 1 View  Result Date: 11/23/2018 CLINICAL DATA:  [facility] Pt has increased AMS, possible sepsis. Pt has pressure sores on the sacrum. Pt was on antibiotics for UTI recently. Medical hx of Alzheimer's and HTN. EXAM: PORTABLE CHEST 1 VIEW COMPARISON:  08/16/2018 and older exams. FINDINGS: Cardiac silhouette is normal in size. No mediastinal or hilar masses. Linear areas of scarring noted in the inferior right upper lobe adjacent to the minor fissure and left mid to lower  lung. Prominent interstitial markings noted at the lung bases. These findings are stable. No evidence of pneumonia or pulmonary edema. No pleural effusion or pneumothorax. Skeletal structures are demineralized but grossly intact. IMPRESSION: 1. No acute cardiopulmonary disease. Electronically Signed   By: Amie Portlandavid  Ormond M.D.   On: 11/23/2018 07:28    EKG: Independently reviewed.  Sinus tachycardia with left axis deviation.  Assessment/Plan Principal Problem:   Acute metabolic encephalopathy Active Problems:   Osteomyelitis (HCC)   Essential hypertension, benign   History of seizures   Dementia with behavioral disturbance (HCC)   PAF (paroxysmal atrial fibrillation) (HCC)   Type 2 diabetes mellitus without complication (HCC)   History of CVA with residual deficit   FTT (failure to thrive) in adult   Cellulitis   Constipation   1 acute metabolic encephalopathy Patient presenting from nursing home with progressive worsening mental status.  Patient with a history of Alzheimer's dementia.  Patient somewhat drowsy and not answering questions.  Concern for infectious etiology.  Patient however afebrile, normal white count, normal lactic acid level.  On presentation to the ED patient initially noted to be tachycardic with a heart rate of 120 which improved with hydration.  Patient prior to admission on IV vancomycin for sacral osteomyelitis.  Patient has been pancultured in the ED with blood cultures and urine cultures pending.  PICC line in right upper extremity around insertion site with some erythema.??  PICC line infection may need removal however will defer until patient has been assessed by ID.  Continue IV vancomycin.  Will broaden antibiotic coverage to include IV cefepime and IV Flagyl per ID recommendations per ED physician.  Check a head CT.  Check a EEG.  Check a Depakote level.  IV fluids.  Supportive care.  ID to formally consult on the patient for further evaluation and management.  2.   History of seizures Patient currently with no seizures.  Check a Depakote level.  Resume home regimen of Depakote and Keppra.  3.  Dehydration IV fluids.  4.  Paroxysmal atrial fibrillation Heart rate improved with hydration.  Currently rate controlled.  Aspirin for anticoagulation.  5.  History of neurosyphilis Finished treatment in 2014.  Outpatient follow-up.  6.  Failure to thrive Patient on long-term SNF resident, baseline bedbound needing total care and to be fed.  Patient sent to the ED with worsening mental status changes.  Head CT ordered.  EEG ordered.  UA ordered.  Patient pancultured.  Patient may have a progressive dementia.  Consulted palliative care for goals of care.  7.  Dementia Currently stable.  Continue home regimen Aricept.  8.  History of CVA Stable.  Continue aspirin for secondary stroke prophylaxis.  9.  Constipation Placed on MiraLAX twice daily as well as Senokot-S twice daily.  Soapsuds enema x1.  10.  Sacral  osteomyelitis,/??  Cellulitis POA Patient was on IV vancomycin at facility to be treated for 4 weeks for sacral wound/osteomyelitis being followed by ID.  Around sacral wound with a slight erythema.  Patient has been pancultured.  Continue IV vancomycin and will broaden antibiotic coverage to include cefepime and Flagyl per ID recommendations.  11.  Diabetes mellitus type 2 Check a hemoglobin A1c.  Check CBGs before meals and at bedtime.  Place on sliding scale insulin.  Follow.  DVT prophylaxis: Lovenox Code Status: DNR Family Communication: No family at bedside Disposition Plan: Likely back to [facility] once medically stable and okay with ID. Consults called: Infectious disease. Admission status: Place in observation.   Ramiro Harvest MD [facility]  If 7PM-7AM, please contact night-coverage [email or web address]  11/23/2018, 12:39 PM

## 2018-11-23 NOTE — Progress Notes (Signed)
   11/23/18 2301  Vitals  Temp 98.6 F (37 C)  Temp Source Oral  BP (!) 142/96  MAP (mmHg) 110  BP Location Left Arm  BP Method Automatic  Patient Position (if appropriate) Lying  Pulse Rate 79  Resp (!) 24  Oxygen Therapy  SpO2 98 %  O2 Device Nasal Cannula  O2 Flow Rate (L/min) 2 L/min  MEWS Score  MEWS RR 1  MEWS Pulse 0  MEWS Systolic 0  MEWS LOC 1  MEWS Temp 0  MEWS Score 2  MEWS Score Color Yellow  Provider Notification  Provider Name/Title Jeannette Corpus  Date Provider Notified 11/23/18  Time Provider Notified 2307  Notification Type Page  Notification Reason Other (Comment) (routine )  Response No new orders   MD made aware

## 2018-11-23 NOTE — Progress Notes (Signed)
PICC line removed per order and line is intact at 39cm. Unsure what original length was in medical records. Vaseline gauze and gauze dressing applied and pressure held. Site clean, dry, and intact. Patient and RN aware that patient is on bedrest for 30 minutes. Patient and RN aware to leave dressing on and dry for 24 hours.

## 2018-11-23 NOTE — ED Notes (Signed)
ED TO INPATIENT HANDOFF REPORT  Name/Age/Gender Joshua Gonzales 78 y.o. male  Code Status Code Status History    Date Active Date Inactive Code Status Order ID Comments User Context   08/16/2018 1533 08/18/2018 2046 DNR [id number]  Norval Morton, MD ED   08/16/2018 1525 08/16/2018 1533 Full Code [id number]  Norval Morton, MD ED   06/03/2014 2317 06/04/2014 2118 Full Code [id number]  Otho Bellows, MD Inpatient   05/15/2014 1839 05/16/2014 1744 Full Code [id number]  Bethena Roys, MD Inpatient   10/07/2013 0412 10/08/2013 1437 Full Code [id number]  Julianne Rice, MD ED   11/23/2012 2044 11/29/2012 2010 Full Code [id number]  Bernadene Bell, MD Inpatient   Advance Care Planning Activity    Questions for Most Recent Historical Code Status (Order [id number])    Question Answer Comment   In the event of cardiac or respiratory ARREST Do not call a "code blue"    In the event of cardiac or respiratory ARREST Do not perform Intubation, CPR, defibrillation or ACLS    In the event of cardiac or respiratory ARREST Use medication by any route, position, wound care, and other measures to relive pain and suffering. May use oxygen, suction and manual treatment of airway obstruction as needed for comfort.       Home/SNF/Other Skilled nursing facility  Chief Complaint altered mental status  Level of Care/Admitting Diagnosis ED Disposition    ED Disposition Condition [facility]: [place] [[id number]]  Level of Care: Telemetry [5]  Admit to tele based on following criteria: Complex arrhythmia (Bradycardia/Tachycardia)  Covid Evaluation: Asymptomatic Screening Protocol (No Symptoms)  Diagnosis: Acute metabolic encephalopathy [2751700]  Admitting Physician: Eugenie Filler [[id number]]  Attending Physician: Eugenie Filler [[id number]]  PT Class (Do Not Modify): Observation [104]  PT Acc Code (Do Not Modify): Observation [10022]       Medical History Past  Medical History:  Diagnosis Date  . Alzheimer disease ([place])   . Hypertension   . Seizures ([place])     Allergies No Known Allergies  IV Location/Drains/Wounds Patient Lines/Drains/Airways Status   Active Line/Drains/Airways    Name:   Placement date:   Placement time:   Site:   Days:   Peripheral IV 10/03/16 Left;Posterior Hand   10/03/16    1434    Hand   781   Peripheral IV 08/16/18 Right;Upper Arm   08/16/18    1204    Arm   99   Peripheral IV 11/23/18 Left Arm   11/23/18    0659    Arm   less than 1   PICC Single Lumen 11/23/18 PICC Right   11/23/18    -    -   less than 1   Gastrostomy/Enterostomy Percutaneous endoscopic gastrostomy (PEG) 20 Fr. LUQ   09/30/18    1135    LUQ   54   External Urinary Catheter   11/23/18    1749    -   less than 1          Labs/Imaging Results for orders placed or performed during the hospital encounter of 11/23/18 (from the past 48 hour(s))  Lactic acid, plasma     Status: None   Collection Time: 11/23/18  6:52 AM  Result Value Ref Range   Lactic Acid, Venous 1.5 0.5 - 1.9 mmol/L    Comment: Performed at [facility], [place] [address] [place], [place] [address]  Comprehensive metabolic panel     Status: Abnormal   Collection Time: 11/23/18  6:52 AM  Result Value Ref Range   Sodium 140 135 - 145 mmol/L   Potassium 4.3 3.5 - 5.1 mmol/L   Chloride 102 98 - 111 mmol/L   CO2 26 22 - 32 mmol/L   Glucose, Bld 114 (H) 70 - 99 mg/dL   BUN 19 8 - 23 mg/dL   Creatinine, Ser 6.76 0.61 - 1.24 mg/dL   Calcium 9.4 8.9 - 19.5 mg/dL   Total Protein 8.1 6.5 - 8.1 g/dL   Albumin 3.0 (L) 3.5 - 5.0 g/dL   AST 29 15 - 41 U/L   ALT 24 0 - 44 U/L   Alkaline Phosphatase 75 38 - 126 U/L   Total Bilirubin 0.3 0.3 - 1.2 mg/dL   GFR calc non Af Amer >60 >60 mL/min   GFR calc Af Amer >60 >60 mL/min   Anion gap 12 5 - 15    Comment: Performed at [facility], [address] [place], [place] [address]  CBC WITH DIFFERENTIAL      Status: Abnormal   Collection Time: 11/23/18  6:52 AM  Result Value Ref Range   WBC 7.5 4.0 - 10.5 K/uL   RBC 4.02 (L) 4.22 - 5.81 MIL/uL   Hemoglobin 12.6 (L) 13.0 - 17.0 g/dL   HCT 71.2 45.8 - 09.9 %   MCV 99.3 80.0 - 100.0 fL   MCH 31.3 26.0 - 34.0 pg   MCHC 31.6 30.0 - 36.0 g/dL   RDW 83.3 (H) 82.5 - 05.3 %   Platelets 245 150 - 400 K/uL   nRBC 0.0 0.0 - 0.2 %   Neutrophils Relative % 63 %   Neutro Abs 4.8 1.7 - 7.7 K/uL   Lymphocytes Relative 24 %   Lymphs Abs 1.8 0.7 - 4.0 K/uL   Monocytes Relative 7 %   Monocytes Absolute 0.5 0.1 - 1.0 K/uL   Eosinophils Relative 5 %   Eosinophils Absolute 0.4 0.0 - 0.5 K/uL   Basophils Relative 1 %   Basophils Absolute 0.1 0.0 - 0.1 K/uL   Immature Granulocytes 0 %   Abs Immature Granulocytes 0.02 0.00 - 0.07 K/uL    Comment: Performed at [facility], [address], [place], [place] [address]  APTT     Status: None   Collection Time: 11/23/18  6:52 AM  Result Value Ref Range   aPTT 35 24 - 36 seconds    Comment: Performed at [facility], [address], [place], [place] [address]  Protime-INR     Status: None   Collection Time: 11/23/18  6:52 AM  Result Value Ref Range   Prothrombin Time 14.3 11.4 - 15.2 seconds   INR 1.1 0.8 - 1.2    Comment: (NOTE) INR goal varies based on device and disease states. Performed at [facility], [address], [place], [place] [address]   Ammonia     Status: None   Collection Time: 11/23/18  6:54 AM  Result Value Ref Range   Ammonia 27 9 - 35 umol/L    Comment: Performed at [facility], [address], [place], [place] [address]  Urinalysis, Routine w reflex microscopic     Status: Abnormal   Collection Time: 11/23/18  8:59 AM  Result Value Ref Range   Color, Urine YELLOW YELLOW   APPearance CLEAR CLEAR   Specific Gravity, Urine 1.034 (  H) 1.005 - 1.030   pH 5.0 5.0 - 8.0   Glucose, UA NEGATIVE NEGATIVE mg/dL    Hgb urine dipstick NEGATIVE NEGATIVE   Bilirubin Urine NEGATIVE NEGATIVE   Ketones, ur NEGATIVE NEGATIVE mg/dL   Protein, ur NEGATIVE NEGATIVE mg/dL   Nitrite NEGATIVE NEGATIVE   Leukocytes,Ua NEGATIVE NEGATIVE   RBC / HPF 0-5 0 - 5 RBC/hpf   WBC, UA 0-5 0 - 5 WBC/hpf   Bacteria, UA NEGATIVE (A) NONE SEEN   Mucus PRESENT    Hyaline Casts, UA PRESENT     Comment: Performed at [facility], [address] [place], [place] [address]  Lactic acid, plasma     Status: None   Collection Time: 11/23/18  9:11 AM  Result Value Ref Range   Lactic Acid, Venous 1.4 0.5 - 1.9 mmol/L    Comment: Performed at [facility], [address] [place], [place] [address]  Vancomycin, random     Status: None   Collection Time: 11/23/18  9:11 AM  Result Value Ref Range   Vancomycin Rm 23     Comment:        Random Vancomycin therapeutic range is dependent on dosage and time of specimen collection. A peak range is 20.0-40.0 ug/mL A trough range is 5.0-15.0 ug/mL        Performed at [facility], [address] [place], [place] [address]    Ct Head Wo Contrast  Result Date: 11/23/2018 CLINICAL DATA:  Altered mental status, encephalopathy. EXAM: CT HEAD WITHOUT CONTRAST TECHNIQUE: Contiguous axial images were obtained from the base of the skull through the vertex without intravenous contrast. COMPARISON:  Head CT dated 08/16/2018. FINDINGS: Brain: Generalized parenchymal volume loss with commensurate dilatation of the ventricles and sulci. 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 Also again noted are chronic small vessel ischemic changes within the bilateral basal ganglia regions. There is no mass, hemorrhage, edema or other evidence of acute parenchymal abnormality. No extra-axial  hemorrhage. Vascular: Chronic calcified atherosclerotic changes of the large vessels at the skull base. No unexpected hyperdense vessel. Skull: Normal. Negative for fracture or focal lesion. Sinuses/Orbits: No acute finding. Other: None. IMPRESSION: 1. No acute findings. No intracranial mass, hemorrhage or edema. 2. Chronic ischemic changes, as detailed above. Electronically Signed   By: Bary RichardStan  Maynard M.D.   On: 11/23/2018 12:58   Ct Abdomen Pelvis W Contrast  Result Date: 11/23/2018 CLINICAL DATA:  Abdominal distension. Altered mental status. History of sacral wounds. EXAM: CT ABDOMEN AND PELVIS WITH CONTRAST TECHNIQUE: Multidetector CT imaging of the abdomen and pelvis was performed using the standard protocol following bolus administration of intravenous contrast. CONTRAST:  100mL OMNIPAQUE IOHEXOL 300 MG/ML  SOLN COMPARISON:  MRI sacrum dated 11/05/2018. CT abdomen dated 09/17/2018 FINDINGS: Lower chest: Advanced chronic bibasilar interstitial fibrosis with associated bullous change and honeycombing. Hepatobiliary: No focal liver abnormality is seen. No gallstones, gallbladder wall thickening, or biliary dilatation. Pancreas: Unremarkable. No pancreatic ductal dilatation or surrounding inflammatory changes. Spleen: Normal in size without focal abnormality. Adrenals/Urinary Tract: Adrenal glands appear normal. Hypodense foci within both kidneys, too small to definitively characterize but most likely cysts. Additional scarring within the LEFT renal cortex. No suspicious mass, stone or hydronephrosis identified. No ureteral or bladder calculi identified. Bladder is unremarkable. Stomach/Bowel: No dilated large or small bowel  loops. Fairly large amount of stool throughout the nondistended colon. No evidence of bowel wall inflammation seen. Appendix is normal. Gastrostomy tube appears appropriately positioned. Vascular/Lymphatic: Aortic atherosclerosis. 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 No acute appearing vascular abnormality. No enlarged lymph nodes seen in the abdomen or pelvis. Reproductive: Prostate is unremarkable. Other: Sacral decubitus ulcer appear stable compared to the recent MRI, with presumed packing material. Ill-defined fluid stranding/edema within the presacral space is likely related and is also stable compared to the earlier MRI. Underlying osteomyelitis of the sacrum better demonstrated on the earlier MRI. Musculoskeletal: No other acute or suspicious osseous findings. Degenerative spondylosis throughout the slightly scoliotic thoracolumbar spine, mild to moderate in degree. Ill-defined edema within the superficial subcutaneous soft tissues indicating mild anasarca. IMPRESSION: 1. Fairly large amount of stool throughout the nondistended colon (constipation? ). No evidence of bowel obstruction or evidence of bowel wall inflammation. No free fluid or abscess collection. No free intraperitoneal air. 2. Sacral decubitus ulcer appear stable compared to the recent MRI, with presumed packing material. Ill-defined fluid stranding/edema within the presacral space is also stable compared to the earlier MRI. Underlying osteomyelitis of the sacrum better demonstrated on the earlier MRI. 3. Aortic atherosclerosis. 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 4. Advanced chronic bibasilar interstitial fibrosis with associated bullous change and honeycombing. 5. Additional chronic/incidental findings detailed Aortic Atherosclerosis (ICD10-I70.0). Electronically Signed   By: Bary Richard M.D.   On: 11/23/2018 09:30   Dg Chest Port 1 View  Addendum Date: 11/23/2018   ADDENDUM REPORT: 11/23/2018 12:40 ADDENDUM: RIGHT-sided PICC line appears well positioned with tip at the level of the mid/lower SVC. Electronically Signed   By: Bary Richard M.D.   On: 11/23/2018 12:40   Result Date:  11/23/2018 CLINICAL DATA:  Pt [facility] ED via EMS [facility]. Pt has increased AMS, possible sepsis. Pt has pressure sores on the sacrum. Pt was on antibiotics for UTI recently. Medical hx of Alzheimer's and HTN. EXAM: PORTABLE CHEST 1 VIEW COMPARISON:  08/16/2018 and older exams. FINDINGS: Cardiac silhouette is normal in size. No mediastinal or hilar masses. Linear areas of scarring noted in the inferior right upper lobe adjacent to the minor fissure and left mid to lower lung. Prominent interstitial markings noted at the lung bases. These findings are stable. No evidence of pneumonia or pulmonary edema. No pleural effusion or pneumothorax. Skeletal structures are demineralized but grossly intact. IMPRESSION: 1. No acute cardiopulmonary disease. Electronically Signed: By: Amie Portland M.D. On: 11/23/2018 07:28    Pending Labs Unresulted Labs (From admission, onward)    Start     Ordered   11/23/18 1229  Valproic acid level  Once,   STAT     11/23/18 1228   11/23/18 0920  Vancomycin, trough  Add-on,   AD     11/23/18 0919   11/23/18 0624  SARS CORONAVIRUS 2 (TAT 6-24 HRS) Nasopharyngeal Nasopharyngeal Swab  (Asymptomatic/Tier 2 Patients Labs)  ONCE - STAT,   STAT    Question Answer Comment  Is this test for diagnosis or screening Diagnosis of ill patient   Symptomatic for COVID-19 as defined by CDC Yes   Date of Symptom Onset 11/23/2018   Hospitalized for COVID-19 No   Admitted to ICU for COVID-19 No   Previously tested for COVID-19 Unknown   Resident in a congregate (group) care setting Yes  Employed in healthcare setting No      11/23/18 0623   11/23/18 0622  Blood Culture (routine x 2)  BLOOD CULTURE X 2,   STAT     11/23/18 [id number]   11/23/18 0622  Urine culture  ONCE - STAT,   STAT     11/23/18 [id number]   Signed and Held  Magnesium  Add-on,   R     Signed and Held   Signed and Held  Comprehensive metabolic panel  Tomorrow morning,   R     Signed and Held   Signed and Held  CBC   Tomorrow morning,   R     Signed and Held          Vitals/Pain Today's Vitals   11/23/18 1100 11/23/18 1130 11/23/18 1200 11/23/18 1230  BP: (!) 141/90 137/83 (!) 141/99 (!) 134/94  Pulse: 86 94 87 81  Resp: (!) 24 18 (!) 25 18  Temp:      TempSrc:      SpO2: 94% 94% 95% 97%    Isolation Precautions No active isolations  Medications Medications  0.9 %  sodium chloride infusion (1,000 mLs Intravenous New Bag/Given (Non-Interop) 11/23/18 0728)  sodium chloride (PF) 0.9 % injection (has no administration in time range)  vancomycin (VANCOCIN) IVPB 1000 mg/200 mL premix (has no administration in time range)  sodium chloride flush (NS) 0.9 % injection 10-40 mL (has no administration in time range)  Chlorhexidine Gluconate Cloth 2 % PADS 6 each (has no administration in time range)  alteplase (CATHFLO ACTIVASE) injection 2 mg (has no administration in time range)  ceFEPIme (MAXIPIME) 2 g in sodium chloride 0.9 % 100 mL IVPB (0 g Intravenous Stopped 11/23/18 1115)  metroNIDAZOLE (FLAGYL) IVPB 500 mg (0 mg Intravenous Stopped 11/23/18 1116)  sodium chloride 0.9 % bolus 1,000 mL (0 mLs Intravenous Stopped 11/23/18 1116)  iohexol (OMNIPAQUE) 300 MG/ML solution 100 mL (100 mLs Intravenous Contrast Given 11/23/18 0837)  sodium chloride 0.9 % bolus 500 mL (500 mLs Intravenous New Bag/Given (Non-Interop) 11/23/18 1122)    Mobility non-ambulatory

## 2018-11-23 NOTE — Progress Notes (Addendum)
CRITICAL VALUE ALERT  Critical Value:  Vancomycin Trough level 21 Date & Time Notied:  11/23/18 @ 1855  Provider Notified: Notified Pharmacy with results  Orders Received/Actions taken: Advised by pharmacy to proceed with next adjusted scheduled dose of Vancomycin.

## 2018-11-23 NOTE — ED Provider Notes (Signed)
[facility]-EMERGENCY DEPT Provider Note   CSN: [id number] Arrival date & time: 11/23/18  0605     History   Chief Complaint Chief Complaint  Patient presents with  . Altered Mental Status    HPI Joshua Gonzales is a 78 y.o. male with a hx of encephalopathy,Hypertension, seizures, dementia, atrial fibrillation, AKI, diabetes, CVA  presents to the Emergency Department complaining of gradual, persistent, progressively altered mental status onset yesterday afternoon at 4 PM per nursing home staff.  Patient unable to provide any history.  Per EMS, patient comes from [facility].  He does have a DNR.  Paperwork reviewed.  Patient has a right midline line for which he is receiving vancomycin.  This was placed 13 days ago.  It was re-bandaged on 9-12.  Additionally he has PEG tube and numerous pressure sores on his sacrum.  Paperwork reports recurrent UTIs.   Level 5 Caveat for AMS.     The history is provided by the patient, medical records, the EMS personnel and a caregiver. No language interpreter was used.    Past Medical History:  Diagnosis Date  . Alzheimer disease (HCC)   . Hypertension   . Seizures [facility])     Patient Active Problem List   Diagnosis Date Noted  . Goals of care, counseling/discussion   . Palliative care by specialist   . FTT (failure to thrive) in adult   . Type 2 diabetes mellitus without complication (HCC) 08/16/2018  . History of CVA with residual deficit 08/16/2018  . Hyperammonemia (HCC) 08/16/2018  . Bradycardia 08/16/2018  . Lactic acid increased   . Hypotension 06/03/2014  . Near syncope 06/03/2014  . AKI (acute kidney injury) (HCC) 06/03/2014  . Partial anterior cerebral circulation infarction (HCC)   . Dementia with behavioral disturbance (HCC)   . PAF (paroxysmal atrial fibrillation) (HCC)   . History of seizures 05/15/2014  . Essential hypertension, benign 01/24/2013  . Unspecified late effects of cerebrovascular  disease 01/24/2013  . Acute encephalopathy 11/26/2012  . Hypokalemia 11/26/2012  . Alcoholism in remission (HCC) 11/26/2012    Past Surgical History:  Procedure Laterality Date  . IR GASTROSTOMY TUBE MOD SED  09/30/2018        Home Medications    Prior to Admission medications   Medication Sig Start Date End Date Taking? Authorizing Provider  vancomycin (VANCOCIN) 1 g injection 1,000 mg by Other route every 12 (twelve) hours. Inject 1g into the vein every 12 hours 11/17/18  Yes [provider]  Amino Acids-Protein Hydrolys (FEEDING SUPPLEMENT, PRO-STAT SUGAR FREE 64,) LIQD Take 30 mLs by mouth 2 (two) times a day.    [provider]  aspirin 81 MG chewable tablet Chew 81 mg by mouth daily.    [provider]  donepezil (ARICEPT) 10 MG tablet Take 10 mg by mouth at bedtime.    [provider]  HYDROcodone-acetaminophen (NORCO/VICODIN) 5-325 MG tablet  10/29/18   [provider]  ipratropium-albuterol (DUONEB) 0.5-2.5 (3) MG/3ML SOLN Take 3 mLs by nebulization every 6 (six) hours as needed (for shortness of breath or wheezing).    [provider]  lactulose (CHRONULAC) 10 GM/15ML solution Take 20 g by mouth daily as needed for moderate constipation.     [provider]  levETIRAcetam (KEPPRA) 100 MG/ML solution Take 750 mg by mouth 2 (two) times daily.    [provider]  losartan (COZAAR) 50 MG tablet Take 50 mg by mouth daily.  [provider]  Multiple Vitamins-Minerals (CERTA-VITE PO) Take 1 tablet by mouth daily.    [provider]  Nutritional Supplements (RESOURCE 2.0 PO) Take 120 mLs by mouth 2 (two) times a day.    [provider]  SANTYL ointment  10/24/18   [provider]  valproic acid (DEPAKENE) 250 MG/5ML SOLN solution  11/09/18   [provider]    Family History Family History  Problem Relation Age of Onset  . Hyperlipidemia Mother   . Hypertension  Mother   . Hypertension Father   . Hyperlipidemia Father     Social History Social History   Tobacco Use  . Smoking status: Former Smoker  Substance Use Topics  . Alcohol use: Yes  . Drug use: Not on file     Allergies   Patient has no known allergies.   Review of Systems Review of Systems  Unable to perform ROS: Mental status change     Physical Exam Updated Vital Signs BP (!) 150/105 (BP Location: Left Arm)   Pulse 98   Temp 98.9 F (37.2 C) (Rectal)   Resp (!) 29   SpO2 96% Comment: Simultaneous filing. User may not have seen previous data.  Physical Exam Vitals signs and nursing note reviewed.  Constitutional:      General: He is not in acute distress.    Appearance: He is toxic-appearing and diaphoretic.     Comments: clammy  HENT:     Head: Normocephalic.  Eyes:     General: No scleral icterus.    Conjunctiva/sclera: Conjunctivae normal.  Neck:     Musculoskeletal: Normal range of motion.  Cardiovascular:     Rate and Rhythm: Regular rhythm. Tachycardia present.     Pulses: Normal pulses.          Radial pulses are 2+ on the right side and 2+ on the left side.  Pulmonary:     Effort: Tachypnea and accessory muscle usage present. No prolonged expiration, respiratory distress or retractions.     Breath sounds: No stridor.     Comments: Equal chest rise. No increased work of breathing. Abdominal:     General: There is no distension.     Palpations: Abdomen is soft.     Tenderness: There is no abdominal tenderness. There is no guarding or rebound.  Genitourinary:    Comments: Large sacral wound with erythematous edges, completely packed. Musculoskeletal:     Comments: Moves all extremities equally and without difficulty. PICC line in place - site clean and dry Extremities are contracted.  Skin:    Capillary Refill: Capillary refill takes less than 2 seconds.     Comments: Hot and clammy Wounds to the bilateral feet without streaking or  cellulitis.  Neurological:     Mental Status: He is alert.     GCS: GCS eye subscore is 4. GCS verbal subscore is 5. GCS motor subscore is 6.     Comments: Speech is clear and goal oriented.      ED Treatments / Results  Labs (all labs ordered are listed, but only abnormal results are displayed) Labs Reviewed  CBC WITH DIFFERENTIAL/PLATELET - Abnormal; Notable for the following components:      Result Value   RBC 4.02 (*)    Hemoglobin 12.6 (*)    RDW 15.7 (*)    All other components within normal limits  CULTURE, BLOOD (ROUTINE X 2)  CULTURE, BLOOD (ROUTINE X 2)  URINE CULTURE  SARS CORONAVIRUS 2 (  TAT 6-24 HRS)  LACTIC ACID, PLASMA  APTT  PROTIME-INR  LACTIC ACID, PLASMA  COMPREHENSIVE METABOLIC PANEL  URINALYSIS, ROUTINE W REFLEX MICROSCOPIC  AMMONIA    EKG EKG Interpretation  Date/Time:  Saturday November 23 2018 06:19:11 EDT Ventricular Rate:  107 PR Interval:    QRS Duration: 82 QT Interval:  327 QTC Calculation: 437 R Axis:   -42 Text Interpretation:  Sinus tachycardia Left axis deviation Low voltage, extremity leads Consider anterior infarct No significant change was found Confirmed by Glynn Octaveancour, Stephen [phone]([id number]) on 11/23/2018 6:29:14 AM   Procedures .Critical Care Performed by: Dierdre ForthMuthersbaugh, Tyjai Matuszak, PA-C Authorized by: Dierdre ForthMuthersbaugh, Wylder Macomber, PA-C   Critical care provider statement:    Critical care time (minutes):  35   Critical care time was exclusive of:  Separately billable procedures and treating other patients and teaching time   Critical care was necessary to treat or prevent imminent or life-threatening deterioration of the following conditions:  Sepsis   Critical care was time spent personally by me on the following activities:  Discussions with consultants, evaluation of patient's response to treatment, examination of patient, ordering and performing treatments and interventions, ordering and review of laboratory studies, ordering and review of  radiographic studies, pulse oximetry, re-evaluation of patient's condition, obtaining history from patient or surrogate and review of old charts   I assumed direction of critical care for this patient from another provider in my specialty: no     (including critical care time)  Medications Ordered in ED Medications  ceFEPIme (MAXIPIME) 2 g in sodium chloride 0.9 % 100 mL IVPB (has no administration in time range)  metroNIDAZOLE (FLAGYL) IVPB 500 mg (has no administration in time range)  0.9 %  sodium chloride infusion (has no administration in time range)     Initial Impression / Assessment and Plan / ED Course  I have reviewed the triage vital signs and the nursing notes.  Pertinent labs & imaging results that were available during my care of the patient were reviewed by me and considered in my medical decision making (see chart for details).  Clinical Course as of Nov 23 726  Sat Nov 23, 2018  [id number] Discussed with [place], Jaci StandardJaylen Perry.  This morning around 4pm yesterday.  This morning he became clammy and has having difficulty breathing.  Spo2 86%, pursed lip breathing and somnolent. She reports he is often cursing and combative.   Vancomycin labs drawn last night are 20 (normal range 5-10).     [HM]    Clinical Course User Index [HM] Mieshia Pepitone, Dahlia ClientHannah, [place] JerseyPA-C         Patient presents with sepsis.  He is febrile, tachycardic, tachypneic.  Hypoxic on room air and requiring supplemental oxygen.  He is hot and clammy.  No hypotension.  Labs, chest x-ray, UA and antibiotics ordered.  Fluid infusion started.  Will hold on 30 mL/kg bolus at this time as he is not hypotensive.  The patient was discussed with and seen by Dr. Manus Gunningancour who agrees with the treatment plan.  7:27 AM Additional labs and imaging pending.  Care transferred to Dr. Virgina NorfolkAdam Curatolo.    Final Clinical Impressions(s) / ED Diagnoses   Final diagnoses:  Altered mental status, unspecified altered mental status  type  Fever, unspecified fever cause    ED Discharge Orders    None       Saryna Kneeland, Boyd KerbsHannah, PA-C 11/23/18 [id number]    Glynn Octaveancour, Stephen, MD 11/23/18 1751

## 2018-11-23 NOTE — ED Provider Notes (Addendum)
Assumed care of patient at 7 AM.  Patient with history of encephalopathy, hypertension, seizures, dementia, atrial fibrillation, diabetes who presents to the ED with altered mental status.  Sepsis work-up was initiated as patient had low-grade temperature, tachycardia.  Currently patient is on vancomycin for sacral wound.  Patient has history of recurrent UTIs.  Sepsis order set was initiated.  Patient empirically given IV antibiotics.  Lab work thus far unremarkable.  No significant leukocytosis, lactic acidosis.  Kidney function normal.  Chest x-ray without signs of infection.  Patient with sacral wound that is well packed but there is some surrounding cellulitis and some serosanguineous drainage in this area.  Suspect source of ongoing infection.  Blood cultures have been collected.  Foley in place and awaiting urinalysis.  Will have a CT abdomen pelvis also ordered to further evaluate for infectious process.  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  There is some ill-defined fluid stranding edema as well but was seen on prior.  Discussed with Dr. Johnnye Sima with infectious disease and he is okay with adding cefepime and Flagyl for further coverage. ID will come to evaluate the patient and give further abx recs. Possibly an occult bacteremia and recommends admission to follow-up cultures.  Likely with ongoing cellulitis around osteomyletitis site. Recommend wound care inpatient as well.  This chart was dictated using voice recognition software.  Despite best efforts to proofread,  errors can occur which can change the documentation meaning.     Lennice Sites, DO 11/23/18 [place], [place], DO 11/23/18 1113

## 2018-11-24 DIAGNOSIS — Z87440 Personal history of urinary (tract) infections: Secondary | ICD-10-CM | POA: Diagnosis not present

## 2018-11-24 DIAGNOSIS — Y848 Other medical procedures as the cause of abnormal reaction of the patient, or of later complication, without mention of misadventure at the time of the procedure: Secondary | ICD-10-CM | POA: Diagnosis present

## 2018-11-24 DIAGNOSIS — Z515 Encounter for palliative care: Secondary | ICD-10-CM | POA: Diagnosis not present

## 2018-11-24 DIAGNOSIS — Z79899 Other long term (current) drug therapy: Secondary | ICD-10-CM | POA: Diagnosis not present

## 2018-11-24 DIAGNOSIS — R627 Adult failure to thrive: Secondary | ICD-10-CM | POA: Diagnosis present

## 2018-11-24 DIAGNOSIS — Z7189 Other specified counseling: Secondary | ICD-10-CM

## 2018-11-24 DIAGNOSIS — M4628 Osteomyelitis of vertebra, sacral and sacrococcygeal region: Secondary | ICD-10-CM | POA: Diagnosis present

## 2018-11-24 DIAGNOSIS — G309 Alzheimer's disease, unspecified: Secondary | ICD-10-CM | POA: Diagnosis present

## 2018-11-24 DIAGNOSIS — R509 Fever, unspecified: Secondary | ICD-10-CM | POA: Diagnosis present

## 2018-11-24 DIAGNOSIS — G9341 Metabolic encephalopathy: Secondary | ICD-10-CM | POA: Diagnosis present

## 2018-11-24 DIAGNOSIS — I10 Essential (primary) hypertension: Secondary | ICD-10-CM | POA: Diagnosis present

## 2018-11-24 DIAGNOSIS — K5900 Constipation, unspecified: Secondary | ICD-10-CM | POA: Diagnosis present

## 2018-11-24 DIAGNOSIS — F0151 Vascular dementia with behavioral disturbance: Secondary | ICD-10-CM | POA: Diagnosis not present

## 2018-11-24 DIAGNOSIS — Z931 Gastrostomy status: Secondary | ICD-10-CM | POA: Diagnosis not present

## 2018-11-24 DIAGNOSIS — L89159 Pressure ulcer of sacral region, unspecified stage: Secondary | ICD-10-CM | POA: Diagnosis not present

## 2018-11-24 DIAGNOSIS — Z66 Do not resuscitate: Secondary | ICD-10-CM | POA: Diagnosis present

## 2018-11-24 DIAGNOSIS — B9562 Methicillin resistant Staphylococcus aureus infection as the cause of diseases classified elsewhere: Secondary | ICD-10-CM | POA: Diagnosis not present

## 2018-11-24 DIAGNOSIS — R4182 Altered mental status, unspecified: Secondary | ICD-10-CM

## 2018-11-24 DIAGNOSIS — E860 Dehydration: Secondary | ICD-10-CM | POA: Diagnosis present

## 2018-11-24 DIAGNOSIS — Z20828 Contact with and (suspected) exposure to other viral communicable diseases: Secondary | ICD-10-CM | POA: Diagnosis present

## 2018-11-24 DIAGNOSIS — G40909 Epilepsy, unspecified, not intractable, without status epilepticus: Secondary | ICD-10-CM | POA: Diagnosis present

## 2018-11-24 DIAGNOSIS — Z87891 Personal history of nicotine dependence: Secondary | ICD-10-CM | POA: Diagnosis not present

## 2018-11-24 DIAGNOSIS — F0281 Dementia in other diseases classified elsewhere with behavioral disturbance: Secondary | ICD-10-CM | POA: Diagnosis present

## 2018-11-24 DIAGNOSIS — L89154 Pressure ulcer of sacral region, stage 4: Secondary | ICD-10-CM | POA: Diagnosis present

## 2018-11-24 DIAGNOSIS — R7881 Bacteremia: Secondary | ICD-10-CM | POA: Diagnosis not present

## 2018-11-24 DIAGNOSIS — L8990 Pressure ulcer of unspecified site, unspecified stage: Secondary | ICD-10-CM | POA: Insufficient documentation

## 2018-11-24 DIAGNOSIS — T827XXA Infection and inflammatory reaction due to other cardiac and vascular devices, implants and grafts, initial encounter: Secondary | ICD-10-CM | POA: Diagnosis present

## 2018-11-24 DIAGNOSIS — Z792 Long term (current) use of antibiotics: Secondary | ICD-10-CM | POA: Diagnosis not present

## 2018-11-24 DIAGNOSIS — Z87898 Personal history of other specified conditions: Secondary | ICD-10-CM

## 2018-11-24 DIAGNOSIS — E11621 Type 2 diabetes mellitus with foot ulcer: Secondary | ICD-10-CM | POA: Diagnosis present

## 2018-11-24 DIAGNOSIS — I480 Paroxysmal atrial fibrillation: Secondary | ICD-10-CM | POA: Diagnosis present

## 2018-11-24 DIAGNOSIS — I6930 Unspecified sequelae of cerebral infarction: Secondary | ICD-10-CM | POA: Diagnosis not present

## 2018-11-24 DIAGNOSIS — L03312 Cellulitis of back [any part except buttock]: Secondary | ICD-10-CM | POA: Diagnosis present

## 2018-11-24 DIAGNOSIS — E1169 Type 2 diabetes mellitus with other specified complication: Secondary | ICD-10-CM | POA: Diagnosis present

## 2018-11-24 DIAGNOSIS — Z7401 Bed confinement status: Secondary | ICD-10-CM | POA: Diagnosis not present

## 2018-11-24 LAB — BLOOD CULTURE ID PANEL (REFLEXED)

## 2018-11-24 LAB — GLUCOSE, CAPILLARY
Glucose-Capillary: 83 mg/dL (ref 70–99)
Glucose-Capillary: 144 mg/dL — ABNORMAL HIGH (ref 70–99)
Glucose-Capillary: 96 mg/dL (ref 70–99)
Glucose-Capillary: 108 mg/dL — ABNORMAL HIGH (ref 70–99)
Glucose-Capillary: 105 mg/dL — ABNORMAL HIGH (ref 70–99)

## 2018-11-24 LAB — COMPREHENSIVE METABOLIC PANEL
ALT: 22 U/L (ref 0–44)
AST: 23 U/L (ref 15–41)
Albumin: 2.9 g/dL — ABNORMAL LOW (ref 3.5–5.0)
Alkaline Phosphatase: 49 U/L (ref 38–126)
Anion gap: 9 (ref 5–15)
BUN: 13 mg/dL (ref 8–23)
CO2: 23 mmol/L (ref 22–32)
Calcium: 9 mg/dL (ref 8.9–10.3)
Chloride: 107 mmol/L (ref 98–111)
Creatinine, Ser: 0.62 mg/dL (ref 0.61–1.24)
GFR calc Af Amer: >60 mL/min (ref >60)
GFR calc non Af Amer: >60 mL/min (ref >60)
Glucose, Bld: 113 mg/dL — ABNORMAL HIGH (ref 70–99)
Potassium: 3.7 mmol/L (ref 3.5–5.1)
Sodium: 139 mmol/L (ref 135–145)
Total Bilirubin: 0.6 mg/dL (ref 0.3–1.2)
Total Protein: 7.5 g/dL (ref 6.5–8.1)

## 2018-11-24 LAB — URINE CULTURE: Culture: NO GROWTH

## 2018-11-24 LAB — CBC
HCT: 38 % — ABNORMAL LOW (ref 39.0–52.0)
Hemoglobin: 11.6 g/dL — ABNORMAL LOW (ref 13.0–17.0)
MCH: 31 pg (ref 26.0–34.0)
MCHC: 30.5 g/dL (ref 30.0–36.0)
MCV: 101.6 fL — ABNORMAL HIGH (ref 80.0–100.0)
Platelets: 231 10*3/uL (ref 150–400)
RBC: 3.74 MIL/uL — ABNORMAL LOW (ref 4.22–5.81)
RDW: 15.4 % (ref 11.5–15.5)
WBC: 6.2 10*3/uL (ref 4.0–10.5)
nRBC: 0 % (ref 0.0–0.2)

## 2018-11-24 LAB — HEMOGLOBIN A1C
Hgb A1c MFr Bld: 5.8 % — ABNORMAL HIGH (ref 4.8–5.6)
Mean Plasma Glucose: 119.76 mg/dL

## 2018-11-24 LAB — MAGNESIUM: Magnesium: 2 mg/dL (ref 1.7–2.4)

## 2018-11-24 MED ORDER — MAGNESIUM SULFATE 2 GM/50ML IV SOLN
2.0000 g | Freq: Once | INTRAVENOUS | Status: AC
  Administered 2018-11-24: 2 g via INTRAVENOUS
  Filled 2018-11-24: qty 50

## 2018-11-24 MED ORDER — POTASSIUM CHLORIDE 20 MEQ PO PACK
40.0000 meq | PACK | Freq: Once | ORAL | Status: AC
  Administered 2018-11-24: 40 meq via ORAL
  Filled 2018-11-24: qty 2

## 2018-11-24 MED ORDER — SODIUM CHLORIDE 0.9 % IV SOLN
1000.0000 mL | INTRAVENOUS | Status: DC
  Administered 2018-11-24 – 2018-11-28 (×7): 1000 mL via INTRAVENOUS

## 2018-11-24 NOTE — Progress Notes (Signed)
PHARMACY - PHYSICIAN COMMUNICATION CRITICAL VALUE ALERT - BLOOD CULTURE IDENTIFICATION (BCID)  Joshua Gonzales is an 78 y.o. male who presented to [facility] on 11/23/2018 with a chief complaint of osteo  Assessment:  1 of 4 bottles from blood positive with staph species - can be presumed CNS and contaminant  Name of physician (or Provider) Contacted: n/a  Current antibiotics: vanc/cefepim per ID  Changes to prescribed antibiotics recommended:  No changes  Results for orders placed or performed during the hospital encounter of 11/23/18  Blood Culture ID Panel (Reflexed) (Collected: 11/23/2018  6:27 AM)  Result Value Ref Range   Enterococcus species NOT DETECTED NOT DETECTED   Listeria monocytogenes NOT DETECTED NOT DETECTED   Staphylococcus species DETECTED (A) NOT DETECTED   Staphylococcus aureus (BCID) NOT DETECTED NOT DETECTED   Methicillin resistance DETECTED (A) NOT DETECTED   Streptococcus species NOT DETECTED NOT DETECTED   Streptococcus agalactiae NOT DETECTED NOT DETECTED   Streptococcus pneumoniae NOT DETECTED NOT DETECTED   Streptococcus pyogenes NOT DETECTED NOT DETECTED   Acinetobacter baumannii NOT DETECTED NOT DETECTED   Enterobacteriaceae species NOT DETECTED NOT DETECTED   Enterobacter cloacae complex NOT DETECTED NOT DETECTED   Escherichia coli NOT DETECTED NOT DETECTED   Klebsiella oxytoca NOT DETECTED NOT DETECTED   Klebsiella pneumoniae NOT DETECTED NOT DETECTED   Proteus species NOT DETECTED NOT DETECTED   Serratia marcescens NOT DETECTED NOT DETECTED   Haemophilus influenzae NOT DETECTED NOT DETECTED   Neisseria meningitidis NOT DETECTED NOT DETECTED   Pseudomonas aeruginosa NOT DETECTED NOT DETECTED   Candida albicans NOT DETECTED NOT DETECTED   Candida glabrata NOT DETECTED NOT DETECTED   Candida krusei NOT DETECTED NOT DETECTED   Candida parapsilosis NOT DETECTED NOT DETECTED   Candida tropicalis NOT DETECTED NOT DETECTED    Kara Mead 11/24/2018  5:18 PM

## 2018-11-24 NOTE — Progress Notes (Signed)
Central Telemetry reports 4 beats of V-Tach @ 2000

## 2018-11-24 NOTE — Progress Notes (Signed)
Patient had 9beats run of V-tach, patient without any chest pain or distress.  Vital 136/85, 102, 20. 91%-RA, Dr. Grandville Silos notified, EKG, lab, and meds ordered, will continue to assess patient.

## 2018-11-24 NOTE — Progress Notes (Signed)
INFECTIOUS DISEASE PROGRESS NOTE  ID: Joshua PartridgeJohn H Gonzales is a 78 y.o. male with  Principal Problem:   Acute metabolic encephalopathy Active Problems:   Essential hypertension, benign   History of seizures   Dementia with behavioral disturbance (HCC)   PAF (paroxysmal atrial fibrillation) (HCC)   Type 2 diabetes mellitus without complication (HCC)   History of CVA with residual deficit   FTT (failure to thrive) in adult   Cellulitis   Osteomyelitis (HCC)   Constipation  Subjective: Awake and alert, no complaints  Abtx:  Anti-infectives (From admission, onward)   Start     Dose/Rate Route Frequency Ordered Stop   11/23/18 1600  metroNIDAZOLE (FLAGYL) IVPB 500 mg     500 mg 100 mL/hr over 60 Minutes Intravenous Every 8 hours 11/23/18 1343     11/23/18 1400  ceFEPIme (MAXIPIME) 2 g in sodium chloride 0.9 % 100 mL IVPB     2 g 200 mL/hr over 30 Minutes Intravenous Every 8 hours 11/23/18 1307     11/23/18 1345  vancomycin (VANCOCIN) 1,250 mg in sodium chloride 0.9 % 250 mL IVPB  Status:  Discontinued     1,250 mg 166.7 mL/hr over 90 Minutes Intravenous Every 12 hours 11/23/18 1343 11/23/18 1343   11/23/18 1300  vancomycin (VANCOCIN) IVPB 1000 mg/200 mL premix     1,000 mg 200 mL/hr over 60 Minutes Intravenous Every 12 hours 11/23/18 0955     11/23/18 0630  ceFEPIme (MAXIPIME) 2 g in sodium chloride 0.9 % 100 mL IVPB     2 g 200 mL/hr over 30 Minutes Intravenous  Once 11/23/18 0623 11/23/18 1115   11/23/18 0630  metroNIDAZOLE (FLAGYL) IVPB 500 mg     500 mg 100 mL/hr over 60 Minutes Intravenous  Once 11/23/18 0623 11/23/18 1116   11/23/18 0630  vancomycin (VANCOCIN) IVPB 1000 mg/200 mL premix  Status:  Discontinued     1,000 mg 200 mL/hr over 60 Minutes Intravenous  Once 11/23/18 0623 11/23/18 [id number]      Medications:  Scheduled:  aspirin  81 mg Oral Daily   Chlorhexidine Gluconate Cloth  6 each Topical Daily   donepezil  10 mg Oral QHS   enoxaparin (LOVENOX) injection   40 mg Subcutaneous Q24H   feeding supplement (PRO-STAT SUGAR FREE 64)  30 mL Oral BID BM   insulin aspart  0-9 Units Subcutaneous TID WC   levETIRAcetam  750 mg Oral BID   mupirocin ointment  1 application Nasal BID   polyethylene glycol  17 g Oral BID   senna  1 tablet Oral BID   valproic acid  125 mg Per Tube BID    Objective: Vital signs in last 24 hours: Temp:  [98.4 F (36.9 C)-98.7 F (37.1 C)] 98.7 F (37.1 C) (09/20 0643) Pulse Rate:  [79-94] 82 (09/20 0643) Resp:  [18-25] 24 (09/20 0643) BP: (134-142)/(83-99) 142/90 (09/20 0643) SpO2:  [94 %-99 %] 96 % (09/20 0643) Weight:  [73 kg-78 kg] 78 kg (09/20 0643)   General appearance: alert, cooperative and no distress Resp: clear to auscultation bilaterally Cardio: regular rate and rhythm GI: normal findings: bowel sounds normal and soft, non-tender Neurologic: Mental status: alertness: alert, orientation: place: home. date: october,. president: Kyung RuddKennedy.   Lab Results Recent Labs    11/23/18 0652 11/24/18 0427  WBC 7.5 6.2  HGB 12.6* 11.6*  HCT 39.9 38.0*  NA 140 139  K 4.3 3.7  CL 102 107  CO2 26 23  BUN  19 13  CREATININE 0.66 0.62   Liver Panel Recent Labs    11/23/18 0652 11/24/18 0427  PROT 8.1 7.5  ALBUMIN 3.0* 2.9*  AST 29 23  ALT 24 22  ALKPHOS 75 49  BILITOT 0.3 0.6   Sedimentation Rate No results for input(s): ESRSEDRATE in the last 72 hours. C-Reactive Protein No results for input(s): CRP in the last 72 hours.  Microbiology: Recent Results (from the past 240 hour(s))  Urine culture     Status: None   Collection Time: 11/23/18  8:59 AM   Specimen: In/Out Cath Urine  Result Value Ref Range Status   Specimen Description   Final    IN/OUT CATH URINE Performed at [facility], [address] [place], [place] [address]    Special Requests   Final    NONE Performed at [facility], [address] [place], [place] [address]    Culture    Final    NO GROWTH Performed at [facility], [address], [place], [place] [address]    Report Status 11/24/2018 FINAL  Final  SARS CORONAVIRUS 2 (TAT 6-24 HRS) Nasopharyngeal Nasopharyngeal Swab     Status: None   Collection Time: 11/23/18  8:59 AM   Specimen: Nasopharyngeal Swab  Result Value Ref Range Status   SARS Coronavirus 2 NEGATIVE NEGATIVE Final    Comment: (NOTE) SARS-CoV-2 target nucleic acids are NOT DETECTED. The SARS-CoV-2 RNA is generally detectable in upper and lower respiratory specimens during the acute phase of infection. Negative results do not preclude SARS-CoV-2 infection, do not rule out co-infections with other pathogens, and should not be used as the sole basis for treatment or other patient management decisions. Negative results must be combined with clinical observations, patient history, and epidemiological information. The expected result is Negative. Fact Sheet for Patients: HairSlick.no Fact Sheet for Healthcare Providers: quierodirigir.com This test is not yet approved or cleared by the [place] FDA and  has been authorized for detection and/or diagnosis of SARS-CoV-2 by FDA under an Emergency Use Authorization (EUA). This EUA will remain  in effect (meaning this test can be used) for the duration of the COVID-19 declaration under Section 56 4(b)(1) of the Act, 21 U.S.C. section 360bbb-3(b)(1), unless the authorization is terminated or revoked sooner. Performed at [facility], [address], [place], [place] [address]     Studies/Results: Ct Head Wo Contrast  Result Date: 11/23/2018 CLINICAL DATA:  Altered mental status, encephalopathy. EXAM: CT HEAD WITHOUT CONTRAST TECHNIQUE: Contiguous axial images were obtained from the base of the skull through the vertex without intravenous contrast. COMPARISON:  Head CT dated 08/16/2018. FINDINGS: Brain: Generalized parenchymal  volume loss with commensurate dilatation of the ventricles and sulci. 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 Also again noted are chronic small vessel ischemic changes within the bilateral basal ganglia regions. There is no mass, hemorrhage, edema or other evidence of acute parenchymal abnormality. No extra-axial hemorrhage. Vascular: Chronic calcified atherosclerotic changes of the large vessels at the skull base. No unexpected hyperdense vessel. Skull: Normal. Negative for fracture or focal lesion. Sinuses/Orbits: No acute finding. Other: None. IMPRESSION: 1. No acute findings. No intracranial mass, hemorrhage or edema. 2. Chronic ischemic changes, as detailed above. Electronically Signed   By: Bary Richard M.D.   On: 11/23/2018 12:58  Ct Abdomen Pelvis W Contrast  Result Date: 11/23/2018 CLINICAL DATA:  Abdominal distension. Altered mental status. History of sacral wounds. EXAM: CT ABDOMEN AND PELVIS WITH CONTRAST TECHNIQUE: Multidetector CT imaging of the abdomen and pelvis was performed using the standard protocol following bolus administration of intravenous contrast. CONTRAST:  100mL OMNIPAQUE IOHEXOL 300 MG/ML  SOLN COMPARISON:  MRI sacrum dated 11/05/2018. CT abdomen dated 09/17/2018 FINDINGS: Lower chest: Advanced chronic bibasilar interstitial fibrosis with associated bullous change and honeycombing. Hepatobiliary: No focal liver abnormality is seen. No gallstones, gallbladder wall thickening, or biliary dilatation. Pancreas: Unremarkable. No pancreatic ductal dilatation or surrounding inflammatory changes. Spleen: Normal in size without focal abnormality. Adrenals/Urinary Tract: Adrenal glands appear normal. Hypodense foci within both kidneys, too small to definitively characterize but most likely cysts. Additional scarring  within the LEFT renal cortex. No suspicious mass, stone or hydronephrosis identified. No ureteral or bladder calculi identified. Bladder is unremarkable. Stomach/Bowel: No dilated large or small bowel loops. Fairly large amount of stool throughout the nondistended colon. No evidence of bowel wall inflammation seen. Appendix is normal. Gastrostomy tube appears appropriately positioned. Vascular/Lymphatic: Aortic atherosclerosis. 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 No acute appearing vascular abnormality. No enlarged lymph nodes seen in the abdomen or pelvis. Reproductive: Prostate is unremarkable. Other: Sacral decubitus ulcer appear stable compared to the recent MRI, with presumed packing material. Ill-defined fluid stranding/edema within the presacral space is likely related and is also stable compared to the earlier MRI. Underlying osteomyelitis of the sacrum better demonstrated on the earlier MRI. Musculoskeletal: No other acute or suspicious osseous findings. Degenerative spondylosis throughout the slightly scoliotic thoracolumbar spine, mild to moderate in degree. Ill-defined edema within the superficial subcutaneous soft tissues indicating mild anasarca. IMPRESSION: 1. Fairly large amount of stool throughout the nondistended colon (constipation? ). No evidence of bowel obstruction or evidence of bowel wall inflammation. No free fluid or abscess collection. No free intraperitoneal air. 2. Sacral decubitus ulcer appear stable compared to the recent MRI, with presumed packing material. Ill-defined fluid stranding/edema within the presacral space is also stable compared to the earlier MRI. Underlying osteomyelitis of the sacrum better demonstrated on the earlier MRI. 3. Aortic atherosclerosis. 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 4. Advanced chronic bibasilar interstitial  fibrosis with associated bullous change and honeycombing. 5. Additional chronic/incidental findings detailed Aortic Atherosclerosis (ICD10-I70.0). Electronically Signed   By: Bary RichardStan  Maynard M.D.   On: 11/23/2018 09:30   Dg Chest Port 1 View  Addendum Date: 11/23/2018   ADDENDUM REPORT: 11/23/2018 12:40 ADDENDUM: RIGHT-sided PICC line appears well positioned with tip at the level of the mid/lower SVC. Electronically Signed   By: Bary RichardStan  Maynard M.D.   On: 11/23/2018 12:40   Result Date: 11/23/2018 CLINICAL DATA:  [facility] Pt has increased AMS, possible sepsis. Pt has pressure sores on the sacrum. Pt was on antibiotics for UTI recently. Medical hx of Alzheimer's and HTN. EXAM: PORTABLE CHEST 1 VIEW COMPARISON:  08/16/2018 and older exams. FINDINGS: Cardiac silhouette is normal in size. No mediastinal or hilar masses. Linear areas of scarring noted in the inferior right upper lobe adjacent to the minor fissure and left mid to lower lung. Prominent interstitial markings noted at the lung bases. These findings are stable. No evidence of pneumonia or pulmonary edema. No pleural effusion or pneumothorax. Skeletal structures are demineralized but grossly intact. IMPRESSION: 1.  No acute cardiopulmonary disease. Electronically Signed: By: Lajean Manes M.D. On: 11/23/2018 07:28     Assessment/Plan: Mental Status change Sacral Osteomyelitis, decubitus ulcer PIC line From SNF  Total days of antibiotics: 14 vanco, 1 cefepime/flagyl         Mental status is improved, more awake and alert. I do not know his baseline.  PIC removed Wound care BCx, UCx pending- await those prior to anbx changes.   Bobby Rumpf MD, FACP Infectious Diseases (pager) [phone] [email or web address] 11/24/2018, 10:59 AM  LOS: 0 days

## 2018-11-24 NOTE — Consult Note (Signed)
Consultation Note Date: 11/24/2018   Patient Name: Joshua PartridgeJohn H Gonzales  DOB: 03-11-1940  MRN: [id number]  Age / Sex: 78 y.o., male  PCP: Renford DillsPolite, [name], MD Referring Physician: Rodolph Bonghompson, Daniel V, MD  Reason for Consultation: Establishing goals of care  HPI/Patient Profile: 78 y.o. male  with past medical history of HTN, seizures, dementia, PAF, DM2, history of CVA  admitted on 11/23/2018 with sacral osteomyelitis, decub ulcer, confusion .   Clinical Assessment and Goals of Care:  A palliative consult has been requested for goals of care discussions.   Patient has been seen by PMT, he is from facility, he was to have hospice services follow him, unclear if he is currently being followed by hospice, will follow up regarding this with local hospice agency in am on 11-25-2018.   Patient is awake, alert, watching football. He is in no distress. Replies appropriately to questions asked, no family at bedside.   Palliative medicine is specialized medical care for people living with serious illness. It focuses on providing relief from the symptoms and stress of a serious illness. The goal is to improve quality of life for both the patient and the family.  Goals of care: Broad aims of medical therapy in relation to the patient's values and preferences. Our aim is to provide medical care aimed at enabling patients to achieve the goals that matter most to them, given the circumstances of their particular medical situation and their constraints.   Discussed with patient, also with TRH MD. ID note and recommendations also noted. See below.   NEXT OF KIN  niece Toniann FailWendy is next of kin, according to the patient.   SUMMARY OF RECOMMENDATIONS    Agree with DNR, patient seen by PMT in previous hospitalization. Agree with current mode of care, ID following for determining antibiotic regimen.  Recommend SNF rehab with palliative  care to follow on discharge.   Code Status/Advance Care Planning:  DNR    Symptom Management:    as above   Palliative Prophylaxis:   Bowel Regimen  Psycho-social/Spiritual:   Desire for further Chaplaincy support:yes  Additional Recommendations: Caregiving  Support/Resources  Prognosis:   Unable to determine  Discharge Planning: Skilled Nursing Facility for rehab with Palliative care service follow-up      Primary Diagnoses: Present on Admission: . Acute metabolic encephalopathy . Cellulitis . PAF (paroxysmal atrial fibrillation) (HCC) . Osteomyelitis (HCC) . Essential hypertension, benign . Dementia with behavioral disturbance (HCC) . FTT (failure to thrive) in adult . Constipation   I have reviewed the medical record, interviewed the patient and family, and examined the patient. The following aspects are pertinent.  Past Medical History:  Diagnosis Date  . Alzheimer disease (HCC)   . Hypertension   . Seizures (HCC)    Social History   Socioeconomic History  . Marital status: Single    Spouse name: Not on file  . Number of children: Not on file  . Years of education: Not on file  . Highest education level: Not on file  Occupational History  . Not on file  Social Needs  . Financial resource strain: Not on file  . Food insecurity    Worry: Not on file    Inability: Not on file  . Transportation needs    Medical: Not on file    Non-medical: Not on file  Tobacco Use  . Smoking status: Former Smoker  Substance and Sexual Activity  . Alcohol use: Yes  . Drug use: Not on file  . Sexual activity: Not on file  Lifestyle  . Physical activity    Days per week: Not on file    Minutes per session: Not on file  . Stress: Not on file  Relationships  . Social Musician on phone: Not on file    Gets together: Not on file    Attends religious service: Not on file    Active member of club or organization: Not on file    Attends meetings  of clubs or organizations: Not on file    Relationship status: Not on file  Other Topics Concern  . Not on file  Social History Narrative  . Not on file   Family History  Problem Relation Age of Onset  . Hyperlipidemia Mother   . Hypertension Mother   . Hypertension Father   . Hyperlipidemia Father    Scheduled Meds: . aspirin  81 mg Oral Daily  . Chlorhexidine Gluconate Cloth  6 each Topical Daily  . donepezil  10 mg Oral QHS  . enoxaparin (LOVENOX) injection  40 mg Subcutaneous Q24H  . feeding supplement (PRO-STAT SUGAR FREE 64)  30 mL Oral BID BM  . insulin aspart  0-9 Units Subcutaneous TID WC  . levETIRAcetam  750 mg Oral BID  . mupirocin ointment  1 application Nasal BID  . polyethylene glycol  17 g Oral BID  . senna  1 tablet Oral BID  . valproic acid  125 mg Per Tube BID   Continuous Infusions: . sodium chloride 1,000 mL (11/24/18 1234)  . ceFEPime (MAXIPIME) IV 2 g (11/24/18 1314)  . metronidazole 500 mg (11/24/18 0816)  . vancomycin Stopped (11/24/18 0506)   PRN Meds:.acetaminophen **OR** acetaminophen, albuterol, lactulose, ondansetron **OR** ondansetron (ZOFRAN) IV, sodium chloride flush, sodium phosphate, sorbitol, traMADol Medications Prior to Admission:  Prior to Admission medications   Medication Sig Start Date End Date Taking? Authorizing Provider  Amino Acids-Protein Hydrolys (FEEDING SUPPLEMENT, PRO-STAT SUGAR FREE 64,) LIQD Take 30 mLs by mouth 2 (two) times a day.   Yes [provider]  aspirin 81 MG chewable tablet Chew 81 mg by mouth daily.   Yes [provider]  donepezil (ARICEPT) 10 MG tablet Take 10 mg by mouth at bedtime.   Yes [provider]  HYDROcodone-acetaminophen (NORCO/VICODIN) 5-325 MG tablet Take 1 tablet by mouth 2 (two) times daily.   Yes [provider]  ipratropium-albuterol (DUONEB) 0.5-2.5 (3) MG/3ML SOLN Take 3 mLs by nebulization every 6 (six) hours as needed (for shortness of breath or  wheezing).   Yes [provider]  lactulose (CHRONULAC) 10 GM/15ML solution Take 20 g by mouth daily as needed for moderate constipation.    Yes [provider]  levETIRAcetam (KEPPRA) 100 MG/ML solution Take 750 mg by mouth 2 (two) times daily.   Yes [provider]  losartan (COZAAR) 50 MG tablet Take 50 mg by mouth daily.   Yes [provider]  Multiple Vitamins-Minerals (CERTA-VITE PO) Take 1 tablet by mouth daily.  Yes [provider]  valproic acid (DEPAKENE) 250 MG/5ML SOLN solution Place 125 mg into feeding tube 2 (two) times daily.  11/09/18  Yes [provider]  vancomycin 1,250 mg in sodium chloride 0.9 % 250 mL Inject 1,250 mg into the vein every 12 (twelve) hours.   Yes [provider]  vancomycin (VANCOCIN) 1 g injection 1,000 mg by Other route every 12 (twelve) hours. Inject 1g into the vein every 12 hours 11/17/18   [provider]   No Known Allergies Review of Systems Denies pain.   Physical Exam Awake alert Lungs clear S1 S2 Has PEG Answers questions appropriately  Vital Signs: BP (!) 142/85 (BP Location: Left Arm)   Pulse 85   Temp 99.2 F (37.3 C) (Oral)   Resp (!) 21   Wt 78 kg   SpO2 92%   BMI 23.99 kg/m  Pain Scale: 0-10   Pain Score: Asleep   SpO2: SpO2: 92 % O2 Device:SpO2: 92 % O2 Flow Rate: .O2 Flow Rate (L/min): 2 L/min  IO: Intake/output summary:   Intake/Output Summary (Last 24 hours) at 11/24/2018 1423 Last data filed at 11/24/2018 1106 Gross per 24 hour  Intake 3833.8 ml  Output 3100 ml  Net 733.8 ml    LBM: Last BM Date: 11/24/18 Baseline Weight: Weight: 73 kg Most recent weight: Weight: 78 kg     Palliative Assessment/Data:   PPS 40%  Time In: 1300 Time Out:  1400 Time Total:  60 Greater than 50%  of this time was spent counseling and coordinating care related to the above assessment and plan.  Signed by: Loistine Chance, MD [phone]  Please contact  Palliative Medicine Team phone at [phone] for questions and concerns.  For individual provider: See Shea Evans

## 2018-11-24 NOTE — Progress Notes (Signed)
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  Patient admitted.  Patient pancultured.  Patient placed empirically on IV antibiotics.  Site of right PICC line with some erythema and PICC line removed per recommendations from ID.  ID following.   Assessment & Plan:   Principal Problem:   Acute metabolic encephalopathy Active Problems:   Osteomyelitis (HCC)   Essential hypertension, benign   History of seizures   Dementia with behavioral disturbance (HCC)   PAF (paroxysmal atrial fibrillation) (HCC)   Type 2 diabetes mellitus without complication (HCC)   History of CVA with residual deficit   FTT (failure to thrive) in adult   Cellulitis   Constipation   Pressure injury of skin  1 acute metabolic encephalopathy Patient presenting from nursing home with progressive worsening mental status.  Patient with a history of Alzheimer's dementia.  Patient somewhat drowsy and not answering questions on admission..  Concern for infectious etiology.  Patient improving clinically more alert today. Patient however afebrile, normal white count, normal lactic acid level.  On presentation to the ED patient initially noted to be tachycardic with a heart rate of 120 which improved with hydration.  Patient prior to admission on IV vancomycin for sacral osteomyelitis.  Patient has been pancultured in the ED with blood cultures with preliminary cultures positive for gram-positive cocci.  Urine cultures pending.  PICC line in right upper extremity  around insertion site with some erythema which has subsequently been discontinued per ID recommendations.  May need 2D echo pending blood culture results.  Head CT negative for any acute abnormalities.  Discontinue EEG order.  Continue current regimen of IV vancomycin, IV cefepime, IV Flagyl.  ID following and appreciate input and recommendations.  Supportive care.  2.  History of seizures Patient currently with no seizures. Continue home regimen Keppra.  Patient also on Depakote.   3.  Dehydration Continue hydration with IV fluids.  4.  Paroxysmal atrial fibrillation Heart rate improved with hydration.  Currently rate controlled.  Aspirin for anticoagulation.  5.  History of neurosyphilis Finished treatment in 2014.  Outpatient follow-up.  6.  Failure to thrive Patient on long-term SNF resident, baseline bedbound needing total care and to be fed.  Patient sent to the ED with worsening mental status changes.  Head CT negative.  EEG ordered and pending however will discontinue as patient more alert with hydration and IV antibiotics.  Preliminary blood cultures positive for gram-positive cocci.  Patient on IV antibiotics.  Palliative care consulted and are following.   7.  Dementia Currently stable.  Continue home regimen Aricept.  8.  History of CVA Stable.  Continue aspirin for secondary stroke prophylaxis.  9.  Constipation Continue current regimen of MiraLAX twice daily and Senokot-S twice daily.  Soapsuds enema was ordered on admission and patient noted to have bowel movement.  Continue current bowel regimen.   10.  Sacral osteomyelitis,/??  Cellulitis POA Patient was on IV vancomycin at facility to be treated for 4 weeks for sacral wound/osteomyelitis being followed by ID.  Around sacral wound with a slight erythema.  Patient has  been pancultured.  Preliminary with gram-positive cocci in clusters.  May need 2D echo to rule out endocarditis.  Place on contact precautions.  Continue IV vancomycin, IV cefepime and IV Flagyl.  ID following and appreciate input and recommendations.  47.    Well controlled diabetes mellitus type 2 Hemoglobin A1c of 5.8.  Check a hemoglobin A1c. CBG of 96 this morning.  Continue sliding scale insulin.    DVT prophylaxis: Lovenox Code Status: DNR Family Communication: Updated patient.  No family at bedside. Disposition Plan: Likely back to [facility] skilled nursing facility when medically stable.   Consultants:   ID: Dr. Johnnye Sima 11/23/2018  Palliative care: Dr. Rowe Pavy 11/24/2018  Procedures:   PICC line removal 11/23/2018  CT head without contrast 11/23/2018  CT abdomen and pelvis 11/23/2018  Chest x-ray 11/23/2018    Antimicrobials:   IV vancomycin started prior to admission for sacral osteomyelitis  IV cefepime 11/23/2018  IV Flagyl 11/23/2018   Subjective: Patient alert today and answering some questions appropriately.  Patient denies any chest pain or shortness of breath.  Patient denies any abdominal pain.  Objective: Vitals:   11/23/18 2301 11/24/18 0643 11/24/18 1226 11/24/18 1538  BP: (!) 142/96 (!) 142/90 (!) 142/85 136/85  Pulse: 79 82 85 (!) 102  Resp: (!) 24 (!) 24 (!) 21 20  Temp: 98.6 F (37 C) 98.7 F (37.1 C) 99.2 F (37.3 C)   TempSrc: Oral Oral Oral   SpO2: 98% 96% 92% 91%  Weight:  78 kg      Intake/Output Summary (Last 24 hours) at 11/24/2018 1733 Last data filed at 11/24/2018 1437 Gross per 24 hour  Intake 2940.97 ml  Output 3000 ml  Net -59.03 ml   Filed Weights   11/23/18 1300 11/24/18 0643  Weight: 73 kg 78 kg    Examination:  General exam: Appears calm and comfortable  Respiratory system: Clear to auscultation anterior lung fields. Respiratory effort normal. Cardiovascular system: S1 & S2 heard, RRR. No JVD, murmurs, rubs, gallops or clicks. No pedal edema. Gastrointestinal system: Abdomen is nondistended, soft and nontender. No organomegaly or masses felt. Normal  bowel sounds heard.  PEG tube site intact. Central nervous system: Alert. No focal neurological deficits. Extremities: Symmetric 5 x 5 power. Skin: No rashes, lesions or ulcers Psychiatry: Judgement and insight appear normal. Mood & affect appropriate.     Data Reviewed: I have personally reviewed following labs and imaging studies  CBC: Recent Labs  Lab 11/23/18 0652 11/24/18 0427  WBC 7.5 6.2  NEUTROABS 4.8  --   HGB 12.6* 11.6*  HCT 39.9 38.0*  MCV 99.3 101.6*  PLT 245 161   Basic Metabolic Panel: Recent Labs  Lab 11/23/18 0652 11/23/18 1100 11/24/18 0427  NA 140  --  139  K 4.3  --  3.7  CL 102  --  107  CO2 26  --  23  GLUCOSE 114*  --  113*  BUN 19  --  13  CREATININE 0.66  --  0.62  CALCIUM 9.4  --  9.0  MG  --  1.9 2.0   GFR: Estimated Creatinine Clearance: 85 mL/min (by C-G formula based on SCr of 0.62 mg/dL). Liver Function Tests: Recent Labs  Lab 11/23/18 0652 11/24/18 0427  AST 29 23  ALT 24 22  ALKPHOS 75 49  BILITOT 0.3 0.6  PROT 8.1 7.5  ALBUMIN 3.0* 2.9*   No results for input(s): LIPASE, AMYLASE in the last 168 hours. Recent Labs  Lab 11/23/18 0654  AMMONIA 27   Coagulation Profile: Recent Labs  Lab 11/23/18 0652  INR 1.1   Cardiac Enzymes: No results for input(s): CKTOTAL, CKMB, CKMBINDEX, TROPONINI in the last 168 hours. BNP (last 3 results) No results for input(s): PROBNP in the last 8760 hours. HbA1C: Recent Labs    11/24/18 0427  HGBA1C 5.8*   CBG: Recent Labs  Lab 11/24/18 0131 11/24/18 0445 11/24/18 0756 11/24/18 1115 11/24/18 1648  GLUCAP 83 144* 96 108* 105*   Lipid Profile: No results for input(s): CHOL, HDL, LDLCALC, TRIG, CHOLHDL, LDLDIRECT in the last 72 hours. Thyroid Function Tests: No results for input(s): TSH, T4TOTAL, FREET4, T3FREE, THYROIDAB in the last 72 hours. Anemia Panel: No results for input(s): VITAMINB12, FOLATE, FERRITIN, TIBC, IRON, RETICCTPCT in the last 72 hours. Sepsis  Labs: Recent Labs  Lab 11/23/18 [id number] 11/23/18 0911  LATICACIDVEN 1.5 1.4    Recent Results (from the past 240 hour(s))  Blood Culture (routine x 2)     Status: None (Preliminary result)   Collection Time: 11/23/18  6:27 AM   Specimen: BLOOD LEFT HAND  Result Value Ref Range Status   Specimen Description   Final    BLOOD LEFT HAND Performed at [facility], [address] [place], [place] [address]    Special Requests   Final    BOTTLES DRAWN AEROBIC AND ANAEROBIC Blood Culture results may not be optimal due to an inadequate volume of blood received in culture bottles Performed at [facility], [address] [place], [place] [address]    Culture  Setup Time   Final    GRAM POSITIVE COCCI IN CLUSTERS ANAEROBIC BOTTLE ONLY CRITICAL RESULT CALLED TO, READ BACK BY AND VERIFIED WITH: PHARMD JUSTIN LEGGE 1715 [id number] FCP Performed at [facility], [address] [place], [place] [address]    Culture GRAM POSITIVE COCCI  Final   Report Status PENDING  Incomplete  Blood Culture ID Panel (Reflexed)     Status: Abnormal   Collection Time: 11/23/18  6:27 AM  Result Value Ref Range Status   Enterococcus species NOT DETECTED NOT DETECTED Final   Listeria monocytogenes NOT DETECTED NOT DETECTED Final   Staphylococcus species DETECTED (A) NOT DETECTED Final    Comment: Methicillin (oxacillin) resistant coagulase negative staphylococcus. Possible blood culture contaminant (unless isolated from more than one blood culture draw or clinical case suggests pathogenicity). No antibiotic treatment is indicated for blood  culture contaminants. CRITICAL RESULT CALLED TO, READ BACK BY AND VERIFIED WITH: PHARMD JUSTIN LEGGE 1715 [id number] FCP    Staphylococcus aureus (BCID) NOT DETECTED NOT DETECTED Final   Methicillin resistance DETECTED (A) NOT DETECTED Final    Comment: CRITICAL RESULT CALLED TO, READ BACK BY AND VERIFIED WITH: PHARMD JUSTIN LEGGE 1715 [id number] FCP     Streptococcus species NOT DETECTED NOT DETECTED Final   Streptococcus agalactiae NOT DETECTED NOT DETECTED Final   Streptococcus pneumoniae NOT DETECTED NOT DETECTED Final   Streptococcus pyogenes NOT DETECTED NOT DETECTED Final   Acinetobacter baumannii NOT DETECTED NOT DETECTED Final   Enterobacteriaceae species NOT DETECTED NOT DETECTED Final   Enterobacter cloacae complex NOT DETECTED NOT DETECTED Final   Escherichia coli NOT DETECTED NOT DETECTED Final   Klebsiella oxytoca NOT DETECTED NOT DETECTED Final   Klebsiella pneumoniae NOT DETECTED NOT DETECTED Final   Proteus species NOT DETECTED NOT DETECTED Final   Serratia marcescens NOT DETECTED NOT DETECTED Final   Haemophilus influenzae NOT DETECTED  NOT DETECTED Final   Neisseria meningitidis NOT DETECTED NOT DETECTED Final   Pseudomonas aeruginosa NOT DETECTED NOT DETECTED Final   Candida albicans NOT DETECTED NOT DETECTED Final   Candida glabrata NOT DETECTED NOT DETECTED Final   Candida krusei NOT DETECTED NOT DETECTED Final   Candida parapsilosis NOT DETECTED NOT DETECTED Final   Candida tropicalis NOT DETECTED NOT DETECTED Final    Comment: Performed at [facility], [address] [place], [place] [address]  Blood Culture (routine x 2)     Status: None (Preliminary result)   Collection Time: 11/23/18  6:52 AM   Specimen: BLOOD  Result Value Ref Range Status   Specimen Description   Final    BLOOD BLOOD LEFT FOREARM Performed at [facility], [address], [place], [place] [address]    Special Requests   Final    BOTTLES DRAWN AEROBIC AND ANAEROBIC Blood Culture adequate volume Performed at [facility], [address] [place], [place] [address]    Culture   Final    NO GROWTH 1 DAY Performed at [facility], [address], [place], [place] [address]    Report Status PENDING  Incomplete  Urine culture     Status: None   Collection Time: 11/23/18  8:59 AM    Specimen: In/Out Cath Urine  Result Value Ref Range Status   Specimen Description   Final    IN/OUT CATH URINE Performed at [facility], [address], [place], [place] [address]    Special Requests   Final    NONE Performed at [facility], [address], [place], [place] [address]    Culture   Final    NO GROWTH Performed at [facility], [address] [place], [place] [address]    Report Status 11/24/2018 FINAL  Final  SARS CORONAVIRUS 2 (TAT 6-24 HRS) Nasopharyngeal Nasopharyngeal Swab     Status: None   Collection Time: 11/23/18  8:59 AM   Specimen: Nasopharyngeal Swab  Result Value Ref Range Status   SARS Coronavirus 2 NEGATIVE NEGATIVE Final    Comment: (NOTE) SARS-CoV-2 target nucleic acids are NOT DETECTED. The SARS-CoV-2 RNA is generally detectable in upper and lower respiratory specimens during the acute phase of infection. Negative results do not preclude SARS-CoV-2 infection, do not rule out co-infections with other pathogens, and should not be used as the sole basis for treatment or other patient management decisions. Negative results must be combined with clinical observations, patient history, and epidemiological information. The expected result is Negative. Fact Sheet for Patients: HairSlick.no Fact Sheet for Healthcare Providers: quierodirigir.com This test is not yet approved or cleared by the [place] FDA and  has been authorized for detection and/or diagnosis of SARS-CoV-2 by FDA under an Emergency Use Authorization (EUA). This EUA will remain  in effect (meaning this test can be used) for the duration of the COVID-19 declaration under Section 56 4(b)(1) of the Act, 21 U.S.C. section 360bbb-3(b)(1), unless the authorization is terminated or revoked sooner. Performed at [facility], [address], [place], [place] [address]           Radiology Studies: Ct Head Wo Contrast  Result Date: 11/23/2018 CLINICAL DATA:  Altered mental status, encephalopathy. EXAM: CT HEAD WITHOUT CONTRAST TECHNIQUE: Contiguous axial images were obtained from the base of the skull through the vertex without intravenous contrast. COMPARISON:  Head CT dated 08/16/2018. FINDINGS: Brain:  Generalized parenchymal volume loss with commensurate dilatation of the ventricles and sulci. 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 Also again noted are chronic small vessel ischemic changes within the bilateral basal ganglia regions. There is no mass, hemorrhage, edema or other evidence of acute parenchymal abnormality. No extra-axial hemorrhage. Vascular: Chronic calcified atherosclerotic changes of the large vessels at the skull base. No unexpected hyperdense vessel. Skull: Normal. Negative for fracture or focal lesion. Sinuses/Orbits: No acute finding. Other: None. IMPRESSION: 1. No acute findings. No intracranial mass, hemorrhage or edema. 2. Chronic ischemic changes, as detailed above. Electronically Signed   By: Bary Richard M.D.   On: 11/23/2018 12:58   Ct Abdomen Pelvis W Contrast  Result Date: 11/23/2018 CLINICAL DATA:  Abdominal distension. Altered mental status. History of sacral wounds. EXAM: CT ABDOMEN AND PELVIS WITH CONTRAST TECHNIQUE: Multidetector CT imaging of the abdomen and pelvis was performed using the standard protocol following bolus administration of intravenous contrast. CONTRAST:  OMNIPAQUE IOHEXOL 300 MG/ML  SOLN COMPARISON:  MRI sacrum dated 11/05/2018. CT abdomen dated 09/17/2018 FINDINGS: Lower chest: Advanced chronic bibasilar interstitial fibrosis with associated bullous change and honeycombing. Hepatobiliary: No focal liver abnormality is seen. No gallstones,  gallbladder wall thickening, or biliary dilatation. Pancreas: Unremarkable. No pancreatic ductal dilatation or surrounding inflammatory changes. Spleen: Normal in size without focal abnormality. Adrenals/Urinary Tract: Adrenal glands appear normal. Hypodense foci within both kidneys, too small to definitively characterize but most likely cysts. Additional scarring within the LEFT renal cortex. No suspicious mass, stone or hydronephrosis identified. No ureteral or bladder calculi identified. Bladder is unremarkable. Stomach/Bowel: No dilated large or small bowel loops. Fairly large amount of stool throughout the nondistended colon. No evidence of bowel wall inflammation seen. Appendix is normal. Gastrostomy tube appears appropriately positioned. Vascular/Lymphatic: Aortic atherosclerosis. 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 No acute appearing vascular abnormality. No enlarged lymph nodes seen in the abdomen or pelvis. Reproductive: Prostate is unremarkable. Other: Sacral decubitus ulcer appear stable compared to the recent MRI, with presumed packing material. Ill-defined fluid stranding/edema within the presacral space is likely related and is also stable compared to the earlier MRI. Underlying osteomyelitis of the sacrum better demonstrated on the earlier MRI. Musculoskeletal: No other acute or suspicious osseous findings. Degenerative spondylosis throughout the slightly scoliotic thoracolumbar spine, mild to moderate in degree. Ill-defined edema within the superficial subcutaneous soft tissues indicating mild anasarca. IMPRESSION: 1. Fairly large amount of stool throughout the nondistended colon (constipation? ). No evidence of bowel obstruction or evidence of bowel wall inflammation. No free fluid or abscess collection. No free intraperitoneal air. 2. Sacral decubitus ulcer appear stable compared to the recent MRI, with presumed packing material.  Ill-defined fluid stranding/edema within the presacral space is also stable compared to the earlier MRI. Underlying osteomyelitis of the sacrum better demonstrated on the earlier MRI. 3. Aortic atherosclerosis. 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 4. Advanced chronic bibasilar interstitial fibrosis with associated bullous change and honeycombing. 5. Additional chronic/incidental findings detailed Aortic Atherosclerosis (ICD10-I70.0). Electronically Signed   By: Bary Richard M.D.   On: 11/23/2018 09:30   Dg Chest Port 1 View  Addendum Date: 11/23/2018   ADDENDUM REPORT: 11/23/2018 12:40 ADDENDUM: RIGHT-sided PICC line appears well positioned with tip at the  level of the mid/lower SVC. Electronically Signed   By: Bary RichardStan  Maynard M.D.   On: 11/23/2018 12:40   Result Date: 11/23/2018 CLINICAL DATA:  [facility]. Pt has increased AMS, possible sepsis. Pt has pressure sores on the sacrum. Pt was on antibiotics for UTI recently. Medical hx of Alzheimer's and HTN. EXAM: PORTABLE CHEST 1 VIEW COMPARISON:  08/16/2018 and older exams. FINDINGS: Cardiac silhouette is normal in size. No mediastinal or hilar masses. Linear areas of scarring noted in the inferior right upper lobe adjacent to the minor fissure and left mid to lower lung. Prominent interstitial markings noted at the lung bases. These findings are stable. No evidence of pneumonia or pulmonary edema. No pleural effusion or pneumothorax. Skeletal structures are demineralized but grossly intact. IMPRESSION: 1. No acute cardiopulmonary disease. Electronically Signed: By: Amie Portlandavid  Ormond M.D. On: 11/23/2018 07:28        Scheduled Meds:  aspirin  81 mg Oral Daily   Chlorhexidine Gluconate Cloth  6 each Topical Daily   donepezil  10 mg Oral QHS   enoxaparin (LOVENOX) injection  40 mg Subcutaneous Q24H   feeding supplement (PRO-STAT SUGAR FREE 64)  30 mL Oral  BID BM   insulin aspart  0-9 Units Subcutaneous TID WC   levETIRAcetam  750 mg Oral BID   mupirocin ointment  1 application Nasal BID   polyethylene glycol  17 g Oral BID   potassium chloride  40 mEq Oral Once   senna  1 tablet Oral BID   valproic acid  125 mg Per Tube BID   Continuous Infusions:  sodium chloride 1,000 mL (11/24/18 1234)   ceFEPime (MAXIPIME) IV 2 g (11/24/18 1314)   metronidazole 500 mg (11/24/18 1535)   vancomycin Stopped (11/24/18 0506)     LOS: 0 days    Time spent: 35 minutes    Ramiro Harvestaniel Jarrel Knoke, MD [facility]  If 7PM-7AM, please contact night-coverage [email or web address] 11/24/2018, 5:33 PM

## 2018-11-25 ENCOUNTER — Inpatient Hospital Stay: Payer: Self-pay

## 2018-11-25 DIAGNOSIS — B9562 Methicillin resistant Staphylococcus aureus infection as the cause of diseases classified elsewhere: Secondary | ICD-10-CM

## 2018-11-25 DIAGNOSIS — R7881 Bacteremia: Secondary | ICD-10-CM

## 2018-11-25 DIAGNOSIS — M4628 Osteomyelitis of vertebra, sacral and sacrococcygeal region: Secondary | ICD-10-CM

## 2018-11-25 DIAGNOSIS — F0390 Unspecified dementia without behavioral disturbance: Secondary | ICD-10-CM

## 2018-11-25 LAB — GLUCOSE, CAPILLARY
Glucose-Capillary: 99 mg/dL (ref 70–99)
Glucose-Capillary: 109 mg/dL — ABNORMAL HIGH (ref 70–99)
Glucose-Capillary: 137 mg/dL — ABNORMAL HIGH (ref 70–99)
Glucose-Capillary: 80 mg/dL (ref 70–99)
Glucose-Capillary: 128 mg/dL — ABNORMAL HIGH (ref 70–99)
Glucose-Capillary: 144 mg/dL — ABNORMAL HIGH (ref 70–99)

## 2018-11-25 LAB — BASIC METABOLIC PANEL
Anion gap: 9 (ref 5–15)
BUN: 14 mg/dL (ref 8–23)
CO2: 24 mmol/L (ref 22–32)
Calcium: 8.8 mg/dL — ABNORMAL LOW (ref 8.9–10.3)
Chloride: 106 mmol/L (ref 98–111)
Creatinine, Ser: 0.67 mg/dL (ref 0.61–1.24)
GFR calc Af Amer: >60 mL/min (ref >60)
GFR calc non Af Amer: >60 mL/min (ref >60)
Glucose, Bld: 89 mg/dL (ref 70–99)
Potassium: 3.9 mmol/L (ref 3.5–5.1)
Sodium: 139 mmol/L (ref 135–145)

## 2018-11-25 LAB — CBC WITH DIFFERENTIAL/PLATELET
Abs Immature Granulocytes: 0.02 10*3/uL (ref 0.00–0.07)
Basophils Absolute: 0 10*3/uL (ref 0.0–0.1)
Basophils Relative: 1 %
Eosinophils Absolute: 0.1 10*3/uL (ref 0.0–0.5)
Eosinophils Relative: 2 %
HCT: 35.2 % — ABNORMAL LOW (ref 39.0–52.0)
Hemoglobin: 10.8 g/dL — ABNORMAL LOW (ref 13.0–17.0)
Immature Granulocytes: 0 %
Lymphocytes Relative: 29 %
Lymphs Abs: 1.7 10*3/uL (ref 0.7–4.0)
MCH: 30.7 pg (ref 26.0–34.0)
MCHC: 30.7 g/dL (ref 30.0–36.0)
MCV: 100 fL (ref 80.0–100.0)
Monocytes Absolute: 0.7 10*3/uL (ref 0.1–1.0)
Monocytes Relative: 11 %
Neutro Abs: 3.5 10*3/uL (ref 1.7–7.7)
Neutrophils Relative %: 57 %
Platelets: 251 10*3/uL (ref 150–400)
RBC: 3.52 MIL/uL — ABNORMAL LOW (ref 4.22–5.81)
RDW: 15.5 % (ref 11.5–15.5)
WBC: 6 10*3/uL (ref 4.0–10.5)
nRBC: 0 % (ref 0.0–0.2)

## 2018-11-25 LAB — MAGNESIUM: Magnesium: 2.2 mg/dL (ref 1.7–2.4)

## 2018-11-25 MED ORDER — METOPROLOL TARTRATE 25 MG PO TABS
12.5000 mg | ORAL_TABLET | Freq: Two times a day (BID) | ORAL | Status: DC
  Administered 2018-11-25 – 2018-11-28 (×7): 12.5 mg via ORAL
  Filled 2018-11-25 (×8): qty 1

## 2018-11-25 MED ORDER — HYDRALAZINE HCL 20 MG/ML IJ SOLN: 10.0000 mg | Freq: Four times a day (QID) | INTRAMUSCULAR | Status: DC | PRN

## 2018-11-25 MED ORDER — LOSARTAN POTASSIUM 50 MG PO TABS
50.0000 mg | ORAL_TABLET | Freq: Every day | ORAL | Status: DC
  Administered 2018-11-25 – 2018-11-28 (×4): 50 mg via ORAL
  Filled 2018-11-25 (×4): qty 1

## 2018-11-25 MED ORDER — COLLAGENASE 250 UNIT/GM EX OINT
TOPICAL_OINTMENT | Freq: Every day | CUTANEOUS | Status: DC
  Administered 2018-11-25 – 2018-11-28 (×4): via TOPICAL
  Filled 2018-11-25: qty 30

## 2018-11-25 NOTE — Progress Notes (Signed)
PROGRESS NOTE    Joshua Gonzales  ZOX:[id number] DOB: 1940-08-18 DOA: 11/23/2018 PCP: Joshua DillsPolite, [name], MD   Chief complaint: worsening confusion   Brief Narrative:  Patient 78 year old male resident of [facility] history of Alzheimer's dementia, hypertension, CVA with visual deficits, history of neurosyphilis that was treated, seizure disorder, ongoing treatment with IV vancomycin for sacral osteomyelitis who was [facility] due to worsening mental status.  Patient admitted.  Patient pancultured.  Patient placed empirically on IV antibiotics.  Site of right PICC line with some erythema and PICC line removed per recommendations from ID.  ID following.   Assessment & Plan:   Principal Problem:   Acute metabolic encephalopathy Active Problems:   Osteomyelitis (HCC)   Essential hypertension, benign   History of seizures   Dementia with behavioral disturbance (HCC)   PAF (paroxysmal atrial fibrillation) (HCC)   Type 2 diabetes mellitus without complication (HCC)   History of CVA with residual deficit   FTT (failure to thrive) in adult   Cellulitis   Constipation   Pressure injury of skin   Osteomyelitis of vertebra, sacral and sacrococcygeal region [name](HCC)  1 acute metabolic encephalopathy Patient presenting from nursing home with progressive worsening mental status.  Patient with a history of Alzheimer's dementia.  Patient somewhat drowsy and not answering questions on admission..  Concern for infectious etiology.  Patient improving clinically more alert today. Patient however afebrile, normal white count, normal lactic acid level.  On presentation to the ED patient initially noted to be tachycardic with a heart rate of 120 which improved with hydration.  Patient prior to admission on IV vancomycin for sacral osteomyelitis.  Patient has been pancultured in the ED with blood cultures with methicillin-resistant coagulase-negative staph.   Likely contaminant per ID.  Per ID patient may have had a transient bacteremia related to PICC line which was removed.  Urine cultures pending. Head CT negative for any acute abnormalities.  Discontinued EEG order.  PICC line reordered per ID.  Continue current regimen of IV vancomycin, IV cefepime, IV Flagyl.  ID following and appreciate input and recommendations.  Supportive care.  2.  History of seizures Patient currently with no seizures. Continue home regimen Keppra.  Patient also on Depakote.   3.  Dehydration IV fluids.   4.  Paroxysmal atrial fibrillation Heart rate improved with hydration.  Currently rate controlled.  Aspirin for anticoagulation.  5.  History of neurosyphilis Finished treatment in 2014.  Outpatient follow-up.  6.  Failure to thrive Patient on long-term SNF resident, baseline bedbound needing total care and to be fed.  Patient sent to the ED with worsening mental status changes.  Head CT negative.  EEG ordered and pending however will discontinue as patient more alert with hydration and IV antibiotics.  Blood cultures with methicillin-resistant coagulase-negative staph likely insignificant contaminant per ID.  Continue current IV antibiotics.  Palliative care following.  Likely back to skilled nursing facility with palliative care following.    7.  Dementia Continue home regimen Aricept.  8.  History of CVA Stable.  Continue aspirin for secondary stroke prophylaxis.  9.  Constipation Continue current regimen of MiraLAX twice daily and Senokot-S twice daily.  Soapsuds enema was ordered on admission and patient noted to have bowel movement.  Continue current bowel regimen.   10.  Sacral osteomyelitis,/??  Cellulitis POA Patient was on IV vancomycin at facility to be treated for 4 weeks for sacral wound/osteomyelitis being followed by  ID.  Around sacral wound with a slight erythema.  Patient has been pancultured.  Blood cultures growing methicillin-resistant  coagulase-negative staph which per ID feel is a insignificant contaminant.  Continue IV vancomycin, IV cefepime and IV Flagyl.  ID following and appreciate input and recommendations.  11.    Well controlled diabetes mellitus type 2 Hemoglobin A1c of 5.8.  DC a.m. CBGs.  Continue sliding scale insulin.    DVT prophylaxis: Lovenox Code Status: DNR Family Communication: Updated patient.  No family at bedside. Disposition Plan: Likely back to [facility] skilled nursing facility when medically stable and okay with ID.   Consultants:   ID: Dr. Ninetta Lights 11/23/2018  Palliative care: Dr. Linna [facility] 11/24/2018  Procedures:   PICC line removal 11/23/2018  CT head without contrast 11/23/2018  CT abdomen and pelvis 11/23/2018  Chest x-ray 11/23/2018    Antimicrobials:   IV vancomycin started prior to admission for sacral osteomyelitis  IV cefepime 11/23/2018  IV Flagyl 11/23/2018   Subjective: Patient resting comfortably.  Easily arousable however drifts back to sleep.  Denies any chest pain or shortness of breath.   Objective: Vitals:   11/24/18 2300 11/25/18 0637 11/25/18 0914 11/25/18 1339  BP: (!) 156/94 (!) 173/117 (!) 145/100 (!) 142/101  Pulse: 85 (!) 111 95 84  Resp: 20 20  18   Temp: 98.7 F (37.1 C) 99.1 F (37.3 C)  98.9 F (37.2 C)  TempSrc: Oral Oral  Oral  SpO2: 94% 96%  98%  Weight:  79.4 kg      Intake/Output Summary (Last 24 hours) at 11/25/2018 1719 Last data filed at 11/25/2018 1500 Gross per 24 hour  Intake 2875.79 ml  Output 2300 ml  Net 575.79 ml   Filed Weights   11/23/18 1300 11/24/18 0643 11/25/18 0637  Weight: 73 kg 78 kg 79.4 kg    Examination:  General exam: NAD Respiratory system: CTAB.  No wheezes, no crackles, no rhonchi.  Respiratory effort normal. Cardiovascular system: Regular rate rhythm no murmurs rubs or gallops.  No JVD.  No lower extremity edema. Gastrointestinal system: Abdomen is soft, nontender, nondistended, positive bowel  sounds.  No rebound.  No guarding.  PEG tube site intact. Central nervous system: Alert. No focal neurological deficits. Extremities: Symmetric 5 x 5 power. Skin: Stage IV sacral decubitus ulcer/deep tissue injury right lateral heel, left medial heel.  Psychiatry: Judgement and insight appear normal. Mood & affect appropriate.     Data Reviewed: I have personally reviewed following labs and imaging studies  CBC: Recent Labs  Lab 11/23/18 0652 11/24/18 0427 11/25/18 0402  WBC 7.5 6.2 6.0  NEUTROABS 4.8  --  3.5  HGB 12.6* 11.6* 10.8*  HCT 39.9 38.0* 35.2*  MCV 99.3 101.6* 100.0  PLT 245 231 251   Basic Metabolic Panel: Recent Labs  Lab 11/23/18 0652 11/23/18 1100 11/24/18 0427 11/25/18 0402  NA 140  --  139 139  K 4.3  --  3.7 3.9  CL 102  --  107 106  CO2 26  --  23 24  GLUCOSE 114*  --  113* 89  BUN 19  --  13 14  CREATININE 0.66  --  0.62 0.67  CALCIUM 9.4  --  9.0 8.8*  MG  --  1.9 2.0 2.2   GFR: Estimated Creatinine Clearance: 85 mL/min (by C-G formula based on SCr of 0.67 mg/dL). Liver Function Tests: Recent Labs  Lab 11/23/18 0652 11/24/18 0427  AST 29 23  ALT 24 22  ALKPHOS 75 49  BILITOT 0.3 0.6  PROT 8.1 7.5  ALBUMIN 3.0* 2.9*   No results for input(s): LIPASE, AMYLASE in the last 168 hours. Recent Labs  Lab 11/23/18 0654  AMMONIA 27   Coagulation Profile: Recent Labs  Lab 11/23/18 0652  INR 1.1   Cardiac Enzymes: No results for input(s): CKTOTAL, CKMB, CKMBINDEX, TROPONINI in the last 168 hours. BNP (last 3 results) No results for input(s): PROBNP in the last 8760 hours. HbA1C: Recent Labs    11/24/18 0427  HGBA1C 5.8*   CBG: Recent Labs  Lab 11/24/18 1648 11/25/18 0854 11/25/18 0932 11/25/18 1202 11/25/18 1649  GLUCAP 105* 99 109* 137* 80   Lipid Profile: No results for input(s): CHOL, HDL, LDLCALC, TRIG, CHOLHDL, LDLDIRECT in the last 72 hours. Thyroid Function Tests: No results for input(s): TSH, T4TOTAL, FREET4,  T3FREE, THYROIDAB in the last 72 hours. Anemia Panel: No results for input(s): VITAMINB12, FOLATE, FERRITIN, TIBC, IRON, RETICCTPCT in the last 72 hours. Sepsis Labs: Recent Labs  Lab 11/23/18 0272 11/23/18 0911  LATICACIDVEN 1.5 1.4    Recent Results (from the past 240 hour(s))  Blood Culture (routine x 2)     Status: Abnormal (Preliminary result)   Collection Time: 11/23/18  6:27 AM   Specimen: BLOOD LEFT HAND  Result Value Ref Range Status   Specimen Description   Final    BLOOD LEFT HAND Performed at [facility], [place] [address], [place], [place] [address]    Special Requests   Final    BOTTLES DRAWN AEROBIC AND ANAEROBIC Blood Culture results may not be optimal due to an inadequate volume of blood received in culture bottles Performed at [facility] [address], [place], [place] [address]    Culture  Setup Time   Final    GRAM POSITIVE COCCI IN CLUSTERS ANAEROBIC BOTTLE ONLY CRITICAL RESULT CALLED TO, READ BACK BY AND VERIFIED WITH: PHARMD JUSTIN LEGGE [phone] FCP    Culture (A)  Final    STAPHYLOCOCCUS SPECIES (COAGULASE NEGATIVE) THE SIGNIFICANCE OF ISOLATING THIS ORGANISM FROM A SINGLE SET OF BLOOD CULTURES WHEN MULTIPLE SETS ARE DRAWN IS UNCERTAIN. PLEASE NOTIFY THE MICROBIOLOGY DEPARTMENT WITHIN ONE WEEK IF SPECIATION AND SENSITIVITIES ARE REQUIRED. Performed at [facility], [place] [address], [place], [place] [address]    Report Status PENDING  Incomplete  Blood Culture ID Panel (Reflexed)     Status: Abnormal   Collection Time: 11/23/18  6:27 AM  Result Value Ref Range Status   Enterococcus species NOT DETECTED NOT DETECTED Final   Listeria monocytogenes NOT DETECTED NOT DETECTED Final   Staphylococcus species DETECTED (A) NOT DETECTED Final    Comment: Methicillin (oxacillin) resistant coagulase negative staphylococcus. Possible blood culture contaminant (unless isolated from more than one blood culture draw or clinical case  suggests pathogenicity). No antibiotic treatment is indicated for blood  culture contaminants. CRITICAL RESULT CALLED TO, READ BACK BY AND VERIFIED WITH: PHARMD JUSTIN LEGGE [phone] FCP    Staphylococcus aureus (BCID) NOT DETECTED NOT DETECTED Final   Methicillin resistance DETECTED (A) NOT DETECTED Final    Comment: CRITICAL RESULT CALLED TO, READ BACK BY AND VERIFIED WITH: PHARMD JUSTIN LEGGE [phone] FCP    Streptococcus species NOT DETECTED NOT DETECTED Final   Streptococcus agalactiae NOT DETECTED NOT DETECTED Final   Streptococcus pneumoniae NOT DETECTED NOT DETECTED Final   Streptococcus pyogenes NOT DETECTED NOT DETECTED Final   Acinetobacter baumannii NOT DETECTED NOT DETECTED  Final   Enterobacteriaceae species NOT DETECTED NOT DETECTED Final   Enterobacter cloacae complex NOT DETECTED NOT DETECTED Final   Escherichia coli NOT DETECTED NOT DETECTED Final   Klebsiella oxytoca NOT DETECTED NOT DETECTED Final   Klebsiella pneumoniae NOT DETECTED NOT DETECTED Final   Proteus species NOT DETECTED NOT DETECTED Final   Serratia marcescens NOT DETECTED NOT DETECTED Final   Haemophilus influenzae NOT DETECTED NOT DETECTED Final   Neisseria meningitidis NOT DETECTED NOT DETECTED Final   Pseudomonas aeruginosa NOT DETECTED NOT DETECTED Final   Candida albicans NOT DETECTED NOT DETECTED Final   Candida glabrata NOT DETECTED NOT DETECTED Final   Candida krusei NOT DETECTED NOT DETECTED Final   Candida parapsilosis NOT DETECTED NOT DETECTED Final   Candida tropicalis NOT DETECTED NOT DETECTED Final    Comment: Performed at [facility], [address], [place], [place] [address]  Blood Culture (routine x 2)     Status: None (Preliminary result)   Collection Time: 11/23/18  6:52 AM   Specimen: BLOOD LEFT FOREARM  Result Value Ref Range Status   Specimen Description   Final    BLOOD LEFT FOREARM Performed at [facility], [address] [place], [place] [address]      Special Requests   Final    BOTTLES DRAWN AEROBIC AND ANAEROBIC Blood Culture adequate volume Performed at [facility], [address], [place], [place] [address]    Culture   Final    NO GROWTH 2 DAYS Performed at [facility], [address] [place], [place] [address]    Report Status PENDING  Incomplete  Urine culture     Status: None   Collection Time: 11/23/18  8:59 AM   Specimen: In/Out Cath Urine  Result Value Ref Range Status   Specimen Description   Final    IN/OUT CATH URINE Performed at [facility], [address], [place], [place] [address]    Special Requests   Final    NONE Performed at [facility], [address], [place], [place] [address]    Culture   Final    NO GROWTH Performed at [facility], [address], [place], [place] [address]    Report Status 11/24/2018 FINAL  Final  SARS CORONAVIRUS 2 (TAT 6-24 HRS) Nasopharyngeal Nasopharyngeal Swab     Status: None   Collection Time: 11/23/18  8:59 AM   Specimen: Nasopharyngeal Swab  Result Value Ref Range Status   SARS Coronavirus 2 NEGATIVE NEGATIVE Final    Comment: (NOTE) SARS-CoV-2 target nucleic acids are NOT DETECTED. The SARS-CoV-2 RNA is generally detectable in upper and lower respiratory specimens during the acute phase of infection. Negative results do not preclude SARS-CoV-2 infection, do not rule out co-infections with other pathogens, and should not be used as the sole basis for treatment or other patient management decisions. Negative results must be combined with clinical observations, patient history, and epidemiological information. The expected result is Negative. Fact Sheet for Patients: HairSlick.no Fact Sheet for Healthcare Providers: quierodirigir.com This test is not yet approved or cleared by the [place] FDA and  has been authorized for detection  and/or diagnosis of SARS-CoV-2 by FDA under an Emergency Use Authorization (EUA). This EUA will remain  in effect (meaning this test can be used) for the duration of the COVID-19 declaration under Section 56 4(b)(1) of the Act, 21 U.S.C. section 360bbb-3(b)(1), unless the authorization is terminated  or revoked sooner. Performed at [facility], [address] [place], [place] [address]          Radiology Studies: [facility]  Result Date: 11/25/2018 If [facility] image not attached, placement could not be confirmed due to current cardiac rhythm.       Scheduled Meds:  aspirin  81 mg Oral Daily   Chlorhexidine Gluconate Cloth  6 each Topical Daily   collagenase   Topical Daily   donepezil  10 mg Oral QHS   enoxaparin (LOVENOX) injection  40 mg Subcutaneous Q24H   feeding supplement (PRO-STAT SUGAR FREE 64)  30 mL Oral BID BM   insulin aspart  0-9 Units Subcutaneous TID WC   levETIRAcetam  750 mg Oral BID   losartan  50 mg Oral Daily   metoprolol tartrate  12.5 mg Oral BID   mupirocin ointment  1 application Nasal BID   polyethylene glycol  17 g Oral BID   senna  1 tablet Oral BID   valproic acid  125 mg Per Tube BID   Continuous Infusions:  sodium chloride Stopped (11/25/18 0559)   ceFEPime (MAXIPIME) IV 2 g (11/25/18 1340)   metronidazole 500 mg (11/25/18 0913)   vancomycin Stopped (11/25/18 0525)     LOS: 1 day    Time spent: 35 minutes    Ramiro Harvestaniel Sapna Padron, MD [facility]  If 7PM-7AM, please contact night-coverage [email or web address] 11/25/2018, 5:19 PM

## 2018-11-25 NOTE — Progress Notes (Addendum)
   11/25/18 1205 Hydrotherapy Evaluation [id number]  Subjective Assessment  Subjective what's going on? It hurts. ( much of speech is garbled and non sensible)  Patient and Family Stated Goals pt. unable to state  Date of Onset  (present on admission)  Prior Treatments dressing changes at SNF  Evaluation and Treatment  Evaluation and Treatment Procedures Explained to Patient/Family Yes  Evaluation and Treatment Procedures Patient unable to consent due to mental status  Pressure Injury 11/25/18 Sacrum Medial Stage IV - Full thickness tissue loss with exposed bone, tendon or muscle. greenish color to drainage, open wound, palpable sacrum( Physical Therapy)  Date First Assessed/Time First Assessed: 11/25/18 1030   Location: Sacrum  Location Orientation: Medial  Staging: Stage IV - Full thickness tissue loss with exposed bone, tendon or muscle.  Wound Description (Comments): greenish color to drainage, ope...  Dressing Type Compression wrap;Moist to moist;Non adherent;Hydrogel;Gauze (Comment) (4/4's Raoul Pitch)  Dressing Changed  Dressing Change Frequency Twice a day  State of Healing Non-healing  Site / Wound Assessment Friable;Painful;Pink;Yellow  % Wound base Red or Granulating 5%  % Wound base Yellow/Fibrinous Exudate 95%  Peri-wound Assessment Erythema (blanchable);Excoriated;Maceration;Purple  Wound Length (cm) 6 cm  Wound Width (cm) 3 cm  Wound Depth (cm) 2 cm  Wound Surface Area (cm^2) 18 cm^2  Wound Volume (cm^3) 36 cm^3  Undermining (cm) 1 (periphery inside entire wound)  Margins Unattached edges (unapproximated)  Drainage Amount Moderate  Drainage Description Green;Odor  Treatment Debridement (Selective);Hydrotherapy (Pulse lavage);Packing (Saline gauze) (santyl)  Hydrotherapy  Pulsed lavage therapy - wound location sacral  Pulsed Lavage with Suction (psi) 8 psi  Pulsed Lavage with Suction - Normal Saline Used 1000 mL  Pulsed Lavage Tip Tip with splash shield   Selective Debridement  Selective Debridement - Location left side of wound  Selective Debridement - Tools Used Forceps;Scissors  Selective Debridement - Tissue Removed yellow tough tissue(probably dermis)  Wound Therapy - Assess/Plan/Recommendations  Wound Therapy - Clinical Statement The patient presents with significant contractures of  LUE and both LE's and trunk and requires total assistance for all ADL's and feeding. There is  an open wound on sacrum, positive for osteomyelitis per CT. The sacral bone is  palpable in wound base. The old dressing had greenish tint, slighly malodorous.  The left side of wound has more tough slough. The patient will benefit from Hydrotherapy 3 times /week to improve wound characteristics and remove nonviable tissue.   The patient indicated the treatment was painful at times. Stopped PLS at intervals for a rest .  Wound Therapy - Functional Problem List contracures of limbs, requires total assistance  Factors Delaying/Impairing Wound Healing Immobility;Infection - systemic/local  Hydrotherapy Plan Debridement;Dressing change;Patient/family education;Pulsatile lavage with suction  Wound Therapy - Frequency 3X / week  Wound Therapy - Follow Up Recommendations Skilled nursing facility (no hydrotherapy will be indicated in SNF)  Wound Plan hydrotherapy and wound care  Wound Therapy Goals - Improve the function of patient's integumentary system by progressing the wound(s) through the phases of wound healing by:  Decrease Necrotic Tissue to 50  Decrease Necrotic Tissue - Progress Goal set today  Increase Granulation Tissue to 50  Increase Granulation Tissue - Progress Goal set today  Improve Drainage Characteristics Min  Improve Drainage Characteristics - Progress Goal set today  Time For Goal Achievement 2 weeks  Wound Therapy - Potential for Goals [place] Pager [phone] Office [phone]  mod. Eval, deb <20

## 2018-11-25 NOTE — Progress Notes (Signed)
Daily Progress Note   Patient Name: Joshua Gonzales       Date: 11/25/2018 DOB: 1940/07/10  Age: 78 y.o. MRN#: [id number] Attending Physician: Rodolph Bong, MD Primary Care Physician: [name], MD Admit Date: 11/23/2018  Reason for Consultation/Follow-up: Establishing goals of care  Subjective:  resting in bed  Length of Stay: 1  Current Medications: Scheduled Meds:  . aspirin  81 mg Oral Daily  . Chlorhexidine Gluconate Cloth  6 each Topical Daily  . collagenase   Topical Daily  . donepezil  10 mg Oral QHS  . enoxaparin (LOVENOX) injection  40 mg Subcutaneous Q24H  . feeding supplement (PRO-STAT SUGAR FREE 64)  30 mL Oral BID BM  . insulin aspart  0-9 Units Subcutaneous TID WC  . levETIRAcetam  750 mg Oral BID  . losartan  50 mg Oral Daily  . metoprolol tartrate  12.5 mg Oral BID  . mupirocin ointment  1 application Nasal BID  . polyethylene glycol  17 g Oral BID  . senna  1 tablet Oral BID  . valproic acid  125 mg Per Tube BID    Continuous Infusions: . sodium chloride Stopped (11/25/18 0559)  . ceFEPime (MAXIPIME) IV 200 mL/hr at 11/25/18 0600  . metronidazole 500 mg (11/25/18 0913)  . vancomycin Stopped (11/25/18 0525)    PRN Meds: acetaminophen **OR** acetaminophen, albuterol, hydrALAZINE, lactulose, ondansetron **OR** ondansetron (ZOFRAN) IV, sodium chloride flush, sodium phosphate, sorbitol, traMADol  Physical Exam         No distress Has sacral wound S1 S2 Clear Has PEG Contractures  Vital Signs: BP (!) 145/100   Pulse 95   Temp 99.1 F (37.3 C) (Oral)   Resp 20   Wt 79.4 kg   SpO2 96%   BMI 24.41 kg/m  SpO2: SpO2: 96 % O2 Device: O2 Device: Nasal Cannula O2 Flow Rate: O2 Flow Rate (L/min): 2 L/min  Intake/output summary:    Intake/Output Summary (Last 24 hours) at 11/25/2018 1325 Last data filed at 11/25/2018 1000 Gross per 24 hour  Intake 2349.46 ml  Output 2900 ml  Net -550.54 ml   LBM: Last BM Date: 11/24/18 Baseline Weight: Weight: 73 kg Most recent weight: Weight: 79.4 kg       Palliative Assessment/Data:      Patient Active Problem List  Diagnosis Date Noted  . Pressure injury of skin 11/24/2018  . Acute metabolic encephalopathy 41/32/4401  . Cellulitis 11/23/2018  . Osteomyelitis ([place]) 11/23/2018  . Constipation 11/23/2018  . Goals of care, counseling/discussion   . Palliative care by specialist   . FTT (failure to thrive) in adult   . Type 2 diabetes mellitus without complication ([place]) 02/72/5366  . History of CVA with residual deficit 08/16/2018  . Hyperammonemia ([place]) 08/16/2018  . Bradycardia 08/16/2018  . Lactic acid increased   . Hypotension 06/03/2014  . Near syncope 06/03/2014  . AKI (acute kidney injury) ([place]) 06/03/2014  . 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 Dementia with behavioral disturbance ([place])   . PAF (paroxysmal atrial fibrillation) ([place])   . History of seizures 05/15/2014  . Essential hypertension, benign 01/24/2013  . Unspecified late effects of cerebrovascular disease 01/24/2013  . Acute encephalopathy 11/26/2012  . Hypokalemia 11/26/2012  . 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 male  with past medical history of HTN, seizures, dementia, PAF, DM2, history of CVA  admitted on 11/23/2018 with sacral osteomyelitis, decub ulcer, confusion .   Assessment:  patient being followed by PT hydrotherapy for stage IV sacral ulcer Seen by PMT before, patient reportedly was enrolled in hospice at his SNF in June 2020, Hospice was revoked by niece for PEG tube placement in July 2020.   Recommendations/Plan:   recommend SNF rehab with palliative to follow on discharge.    Continue with current therapies.    Code Status:    Code Status Orders  (From admission, onward)         Start     Ordered   11/23/18 1343  Do not attempt resuscitation (DNR)  Continuous    Question Answer Comment  In the event of cardiac or respiratory ARREST Do not call a "code blue"   In the event of cardiac or respiratory ARREST Do not perform Intubation, CPR, defibrillation or ACLS   In the event of cardiac or respiratory ARREST Use medication by any route, position, wound care, and other measures to relive pain and suffering. May use oxygen, suction and manual treatment of airway obstruction as needed for comfort.      11/23/18 1343        Code Status History    Date Active Date Inactive Code Status Order ID Comments User Context   08/16/2018 1533 08/18/2018 2046 DNR [id number]  Norval Morton, MD ED   08/16/2018 1525 08/16/2018 1533 Full Code [id number]  Norval Morton, MD ED   06/03/2014 2317 06/04/2014 2118 Full Code [id number]  Otho Bellows, MD Inpatient   05/15/2014 1839 05/16/2014 1744 Full Code [id number]  Bethena Roys, MD Inpatient   10/07/2013 0412 10/08/2013 1437 Full Code [id number]  Julianne Rice, MD ED   11/23/2012 2044 11/29/2012 2010 Full Code [id number]  Bernadene Bell, MD Inpatient   Advance Care Planning Activity       Prognosis:   Unable to determine  Discharge Planning:  [place] for rehab with Palliative care service follow-up  Care plan was discussed with  IDT    Thank you for allowing the Palliative Medicine Team to assist in the care of this patient.   Time In: 1300 Time Out: 1325 Total Time 25 Prolonged Time Billed  no       Greater than 50%  of this time was spent counseling  and coordinating care related to the above assessment and plan.  Loistine Chance, MD [phone] Please contact Palliative Medicine Team phone at [phone] for questions and concerns.

## 2018-11-25 NOTE — Consult Note (Signed)
[facility] Nurse wound consult note Reason for Consult: sacral wound assessment. Patient is also noted on the nursing flow sheets to have bilateral heel ulcerations.  Wound type: 1. Stage 4 Pressure Injury: sacral 2. Deep Tissue Injury: right lateral heel 3. Deep Tissue Injury: left medial heel Pressure Injury POA: Yes Measurement: 1. 5.3cm x 3cm x 2.5cm (per nursing notes) 2. 3cm x 4cm x 0cm  3. 3cm x 3cm x 0cm  Wound bed: 1. 90% ruddy, non granular; 10% yellow slough, wound edges do appear open 2. 100% dark purple non blanchable, appears to be resolving blood blister 3.  Two areas separated slightly by epithelial tissue; other two dark areas, appears to be resolving blood blister Drainage (amount, consistency, odor)  None from the heels.  Moderate purulent noted from bedside staff.  Periwound: intact, no acute s/s of infection noted at sacral wound Dressing procedure/placement/frequency: 1. Add enzymatic debridement ointment to the necrotic tissue present in the sacral wound, pack with saline moist gauze. Top with ABD pad, change daily.  2. Add PT for hydrotherapy M/W/F.  3  air mattress replacement in place for moisture management and pressure redistribution 4. Add Prevalon boots bilaterally for offloading heel pressure injuries 5. Add RD consult for supplementation for wound healing.  Patient has FC in place to manage incontinence and lessen exposure to sacral wound.Marland Kitchen

## 2018-11-25 NOTE — Progress Notes (Signed)
Patient ID: Joshua Gonzales, male   DOB: 09/09/1940, 78 y.o.   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  He was being treated with IV vancomycin and was admitted to the hospital 2 days ago because of worsening mental status.  He has dementia at baseline.  1 of 2 admission blood cultures has grown methicillin-resistant coagulase-negative staph.  That probably represents an insignificant contaminant but it is possible that he may have had transient bacteremia related to his PICC which was removed.    The palliative care note indicates that Joshua Gonzales niece revoked his hospice certification in July and that she wanted to continue aggressive care.  I am comfortable having a new PICC placed now.  I will talk to my partner, Dr. Judyann Munson, who saw him in clinic recently to help determine if we go back to vancomycin alone leave him on broader therapy assuming the possibility of a mixed aerobic anaerobic infection.  PLAN: 1. Continue current antibiotics 2. PICC placement  Principal Problem:   Acute metabolic encephalopathy Active Problems:   Essential hypertension, benign   History of seizures   Dementia with behavioral disturbance (H[facility])   PAF (paroxysmal atrial fibrillation) (HCC)   Type 2 diabetes mellitus without complication (HCC)   History of CVA with residual deficit   FTT (failure to thrive) in adult   Cellulitis   Osteomyelitis (HCC)   Constipation   Pressure injury of skin   Scheduled Meds: . aspirin  81 mg Oral Daily  . Chlorhexidine Gluconate Cloth  6 each Topical Daily  . collagenase   Topical Daily  . donepezil  10 mg Oral QHS  . enoxaparin (LOVENOX) injection  40 mg Subcutaneous Q24H  . feeding supplement  (PRO-STAT SUGAR FREE 64)  30 mL Oral BID BM  . insulin aspart  0-9 Units Subcutaneous TID WC  . levETIRAcetam  750 mg Oral BID  . losartan  50 mg Oral Daily  . metoprolol tartrate  12.5 mg Oral BID  . mupirocin ointment  1 application Nasal BID  . polyethylene glycol  17 g Oral BID  . senna  1 tablet Oral BID  . valproic acid  125 mg Per Tube BID   Continuous Infusions: . sodium chloride Stopped (11/25/18 0559)  . ceFEPime (MAXIPIME) IV 2 g (11/25/18 1340)  . metronidazole 500 mg (11/25/18 0913)  . vancomycin Stopped (11/25/18 0525)   PRN Meds:.acetaminophen **OR** acetaminophen, albuterol, hydrALAZINE, lactulose, ondansetron **OR** ondansetron (ZOFRAN) IV, sodium chloride flush, sodium phosphate, sorbitol, traMADol   SUBJECTIVE: He can tell me that he has "feeling okay" but nothing else about his history.  Review of Systems: Review of Systems  Unable to perform ROS: Dementia    No Known Allergies  OBJECTIVE: Vitals:   11/24/18 2300 11/25/18 0637 11/25/18 0914 11/25/18 1339  BP: (!) 156/94 (!) 173/117 (!) 145/100 (!) 142/101  Pulse: 85 (!) 111 95 84  Resp: 20 20  18   Temp: 98.7 F (37.1 C) 99.1 F (37.3 C)  98.9 F (37.2 C)  TempSrc: Oral Oral  Oral  SpO2: 94% 96%  98%  Weight:  79.4 kg  Body mass index is 24.41 kg/m.  Physical Exam Constitutional:      Comments: He is resting quietly in bed on his right side.       Lab Results Lab Results  Component Value Date   WBC 6.0 11/25/2018   HGB 10.8 (L) 11/25/2018   HCT 35.2 (L) 11/25/2018   MCV 100.0 11/25/2018   PLT 251 11/25/2018    Lab Results  Component Value Date   CREATININE 0.67 11/25/2018   BUN 14 11/25/2018   NA 139 11/25/2018   K 3.9 11/25/2018   CL 106 11/25/2018   CO2 24 11/25/2018    Lab Results  Component Value Date   ALT 22 11/24/2018   AST 23 11/24/2018   ALKPHOS 49 11/24/2018   BILITOT 0.6 11/24/2018     Microbiology: Recent Results (from the past 240 hour(s))  Blood  Culture (routine x 2)     Status: Abnormal (Preliminary result)   Collection Time: 11/23/18  6:27 AM   Specimen: BLOOD LEFT HAND  Result Value Ref Range Status   Specimen Description   Final    BLOOD LEFT HAND Performed at [facility], [address] [place], [place] [address]    Special Requests   Final    BOTTLES DRAWN AEROBIC AND ANAEROBIC Blood Culture results may not be optimal due to an inadequate volume of blood received in culture bottles Performed at [facility], [address] [place], [place] [address]    Culture  Setup Time   Final    GRAM POSITIVE COCCI IN CLUSTERS ANAEROBIC BOTTLE ONLY CRITICAL RESULT CALLED TO, READ BACK BY AND VERIFIED WITH: PHARMD JUSTIN LEGGE 1715 [id number] FCP    Culture (A)  Final    STAPHYLOCOCCUS SPECIES (COAGULASE NEGATIVE) THE SIGNIFICANCE OF ISOLATING THIS ORGANISM FROM A SINGLE SET OF BLOOD CULTURES WHEN MULTIPLE SETS ARE DRAWN IS UNCERTAIN. PLEASE NOTIFY THE MICROBIOLOGY DEPARTMENT WITHIN ONE WEEK IF SPECIATION AND SENSITIVITIES ARE REQUIRED. Performed at [facility], [address] [place], [place] [address]    Report Status PENDING  Incomplete  Blood Culture ID Panel (Reflexed)     Status: Abnormal   Collection Time: 11/23/18  6:27 AM  Result Value Ref Range Status   Enterococcus species NOT DETECTED NOT DETECTED Final   Listeria monocytogenes NOT DETECTED NOT DETECTED Final   Staphylococcus species DETECTED (A) NOT DETECTED Final    Comment: Methicillin (oxacillin) resistant coagulase negative staphylococcus. Possible blood culture contaminant (unless isolated from more than one blood culture draw or clinical case suggests pathogenicity). No antibiotic treatment is indicated for blood  culture contaminants. CRITICAL RESULT CALLED TO, READ BACK BY AND VERIFIED WITH: PHARMD JUSTIN LEGGE 1715 [id number] FCP    Staphylococcus aureus (BCID) NOT DETECTED NOT DETECTED Final   Methicillin resistance DETECTED (A)  NOT DETECTED Final    Comment: CRITICAL RESULT CALLED TO, READ BACK BY AND VERIFIED WITH: PHARMD JUSTIN LEGGE 1715 [id number] FCP    Streptococcus species NOT DETECTED NOT DETECTED Final   Streptococcus agalactiae NOT DETECTED NOT DETECTED Final   Streptococcus pneumoniae NOT DETECTED NOT DETECTED Final   Streptococcus pyogenes NOT DETECTED NOT DETECTED Final   Acinetobacter baumannii NOT DETECTED NOT DETECTED Final   Enterobacteriaceae species NOT DETECTED NOT DETECTED Final   Enterobacter cloacae complex NOT DETECTED NOT DETECTED Final   Escherichia coli NOT DETECTED NOT DETECTED Final   Klebsiella oxytoca NOT DETECTED NOT DETECTED Final   Klebsiella pneumoniae NOT DETECTED NOT DETECTED  Final   Proteus species NOT DETECTED NOT DETECTED Final   Serratia marcescens NOT DETECTED NOT DETECTED Final   Haemophilus influenzae NOT DETECTED NOT DETECTED Final   Neisseria meningitidis NOT DETECTED NOT DETECTED Final   Pseudomonas aeruginosa NOT DETECTED NOT DETECTED Final   Candida albicans NOT DETECTED NOT DETECTED Final   Candida glabrata NOT DETECTED NOT DETECTED Final   Candida krusei NOT DETECTED NOT DETECTED Final   Candida parapsilosis NOT DETECTED NOT DETECTED Final   Candida tropicalis NOT DETECTED NOT DETECTED Final    Comment: Performed at [facility], [place] [address] [place], [place] [address]  Blood Culture (routine x 2)     Status: None (Preliminary result)   Collection Time: 11/23/18  6:52 AM   Specimen: BLOOD LEFT FOREARM  Result Value Ref Range Status   Specimen Description   Final    BLOOD LEFT FOREARM Performed at [facility], [place] [address], [place], [place] [address]    Special Requests   Final    BOTTLES DRAWN AEROBIC AND ANAEROBIC Blood Culture adequate volume Performed at [facility] [address], [place], [place] [address]    Culture   Final    NO GROWTH 2 DAYS Performed at [facility] [address] [place],  [place] [address]    Report Status PENDING  Incomplete  Urine culture     Status: None   Collection Time: 11/23/18  8:59 AM   Specimen: In/Out Cath Urine  Result Value Ref Range Status   Specimen Description   Final    IN/OUT CATH URINE Performed at [facility] [address] [place], [place] [address]    Special Requests   Final    NONE Performed at [facility], [place] [address], [place], [place] [address]    Culture   Final    NO GROWTH Performed at [facility], [place] [address], [place], [place] [address]    Report Status 11/24/2018 FINAL  Final  SARS CORONAVIRUS 2 (TAT 6-24 HRS) Nasopharyngeal Nasopharyngeal Swab     Status: None   Collection Time: 11/23/18  8:59 AM   Specimen: Nasopharyngeal Swab  Result Value Ref Range Status   SARS Coronavirus 2 NEGATIVE NEGATIVE Final    Comment: (NOTE) SARS-CoV-2 target nucleic acids are NOT DETECTED. The SARS-CoV-2 RNA is generally detectable in upper and lower respiratory specimens during the acute phase of infection. Negative results do not preclude SARS-CoV-2 infection, do not rule out co-infections with other pathogens, and should not be used as the sole basis for treatment or other patient management decisions. Negative results must be combined with clinical observations, patient history, and epidemiological information. The expected result is Negative. Fact Sheet for Patients: SugarRoll.be Fact Sheet for Healthcare Providers: https://www.woods-mathews.com/ This test is not yet approved or cleared by the [place] FDA and  has been authorized for detection and/or diagnosis of SARS-CoV-2 by FDA under an Emergency Use Authorization (EUA). This EUA will remain  in effect (meaning this test can be used) for the duration of the COVID-19 declaration under Section 56 4(b)(1) of the Act, 21 U.S.C. section 360bbb-3(b)(1), unless the authorization is  terminated or revoked sooner. Performed at [facility], [place] [address] [place],  [address]     Michel Bickers, [place] [facility] [phone] pager   [phone] cell 11/25/2018, 4:58 PM

## 2018-11-26 LAB — GLUCOSE, CAPILLARY
Glucose-Capillary: 91 mg/dL (ref 70–99)
Glucose-Capillary: 129 mg/dL — ABNORMAL HIGH (ref 70–99)
Glucose-Capillary: 117 mg/dL — ABNORMAL HIGH (ref 70–99)
Glucose-Capillary: 102 mg/dL — ABNORMAL HIGH (ref 70–99)

## 2018-11-26 LAB — CULTURE, BLOOD (ROUTINE X 2)

## 2018-11-26 LAB — VANCOMYCIN, TROUGH: Vancomycin Tr: 21 ug/mL (ref 15–20)

## 2018-11-26 MED ORDER — VANCOMYCIN HCL IN DEXTROSE 750-5 MG/150ML-% IV SOLN
750.0000 mg | Freq: Two times a day (BID) | INTRAVENOUS | Status: DC
  Administered 2018-11-27 – 2018-11-28 (×2): 750 mg via INTRAVENOUS
  Filled 2018-11-26 (×4): qty 150

## 2018-11-26 MED ORDER — SODIUM CHLORIDE 0.9 % IV SOLN
2.0000 g | INTRAVENOUS | Status: DC
  Administered 2018-11-27 – 2018-11-28 (×2): 2 g via INTRAVENOUS
  Filled 2018-11-26 (×2): qty 2

## 2018-11-26 MED ORDER — ADULT MULTIVITAMIN W/MINERALS CH
1.0000 | ORAL_TABLET | Freq: Every day | ORAL | Status: DC
  Administered 2018-11-26 – 2018-11-28 (×3): 1 via ORAL
  Filled 2018-11-26 (×3): qty 1

## 2018-11-26 MED ORDER — SODIUM CHLORIDE 0.9 % IV SOLN
1.0000 g | INTRAVENOUS | Status: DC
  Administered 2018-11-26: 1000 mg via INTRAVENOUS
  Filled 2018-11-26: qty 1

## 2018-11-26 MED ORDER — ENSURE ENLIVE PO LIQD
237.0000 mL | Freq: Three times a day (TID) | ORAL | Status: DC
  Administered 2018-11-26 – 2018-11-28 (×4): 237 mL via ORAL

## 2018-11-26 MED ORDER — PRO-STAT SUGAR FREE PO LIQD
30.0000 mL | Freq: Two times a day (BID) | ORAL | Status: DC
  Administered 2018-11-27 (×2): 30 mL via ORAL
  Filled 2018-11-26 (×2): qty 30

## 2018-11-26 MED ORDER — SODIUM CHLORIDE 0.9% FLUSH: 10.0000 mL | INTRAVENOUS | Status: DC | PRN

## 2018-11-26 MED ORDER — SODIUM CHLORIDE 0.9% FLUSH
10.0000 mL | Freq: Two times a day (BID) | INTRAVENOUS | Status: DC
  Administered 2018-11-26: 20 mL
  Administered 2018-11-26 – 2018-11-28 (×2): 10 mL

## 2018-11-26 MED ORDER — METRONIDAZOLE 50 MG/ML ORAL SUSPENSION
500.0000 mg | Freq: Three times a day (TID) | ORAL | Status: DC
  Administered 2018-11-27 – 2018-11-28 (×3): 500 mg
  Filled 2018-11-26 (×6): qty 10

## 2018-11-26 NOTE — Progress Notes (Signed)
PROGRESS NOTE    Joshua Gonzales  DDU:[id number] DOB: April 18, 1940 DOA: 11/23/2018 PCP: Seward Carol, MD   Chief complaint: worsening confusion   Brief Narrative:  Patient 78 year old male resident of [facility] skilled nursing facility history of Alzheimer's dementia, hypertension, CVA with visual deficits, history of neurosyphilis that was treated, seizure disorder, ongoing treatment with IV vancomycin for sacral osteomyelitis who was transferred from [facility] home due to worsening mental status.  Patient admitted.  Patient pancultured.  Patient placed empirically on IV antibiotics.  Site of right PICC line with some erythema and PICC line removed per recommendations from ID.  ID following.   Assessment & Plan:   Principal Problem:   Acute metabolic encephalopathy Active Problems:   Osteomyelitis (HCC)   Essential hypertension, benign   History of seizures   Dementia with behavioral disturbance (HCC)   PAF (paroxysmal atrial fibrillation) (HCC)   Type 2 diabetes mellitus without complication (HCC)   History of CVA with residual deficit   FTT (failure to thrive) in adult   Cellulitis   Constipation   Pressure injury of skin   Osteomyelitis of vertebra, sacral and sacrococcygeal region [facility])  1 acute metabolic encephalopathy Patient presenting from nursing home with progressive worsening mental status.  Patient with a history of Alzheimer's dementia.  Patient was initially drowsy and not answering questions on admission.  Concern for infectious etiology.  Patient improving clinically and more alert today. Patient however afebrile, normal white count, normal lactic acid level.  On presentation to the ED patient initially noted to be tachycardic with a heart rate of 120 which improved with hydration.  Patient prior to admission on IV vancomycin for sacral osteomyelitis.  Patient has been pancultured in the ED with blood cultures with methicillin-resistant coagulase-negative  staph.  Likely contaminant per ID.  Per ID patient may have had a transient bacteremia related to PICC line which was removed.  Urine cultures pending. Head CT negative for any acute abnormalities.  Discontinued EEG order.  PICC line reordered per ID.  Continue current regimen of IV vancomycin.  IV cefepime has been narrowed down to IV Rocephin and IV Flagyl changed to metronidazole per G-tube per ID recommendations.  ID recommending 6 weeks of antibiotics.  Continue supportive care.  ID following and appreciate input and recommendations.    2.  History of seizures Patient currently with no seizures. Continue home regimen Keppra.  Patient also on Depakote.   3.  Dehydration IV fluids.   4.  Paroxysmal atrial fibrillation Heart rate improved with hydration.  Currently rate controlled.  Aspirin for anticoagulation.  5.  History of neurosyphilis Finished treatment in 2014.  Outpatient follow-up.  6.  Failure to thrive Patient on long-term SNF resident, baseline bedbound needing total care and to be fed.  Patient sent to the ED with worsening mental status changes.  Head CT negative.  EEG initially ordered however discontinued as patient improved clinically with hydration and IV antibiotics. Blood cultures with methicillin-resistant coagulase-negative staph likely insignificant contaminant per ID.  Continue current IV antibiotics.  Palliative care following.  Likely back to skilled nursing facility with palliative care following.    7.  Dementia Continue home regimen Aricept.  8.  History of CVA Continue aspirin for secondary stroke prophylaxis.   9.  Constipation Continue current regimen of MiraLAX twice daily and Senokot-S twice daily.  Soapsuds enema was ordered on admission and patient noted to have bowel movement.  Continue current bowel regimen.   10.  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  Cellulitis POA Patient was on IV vancomycin at facility to be  treated for 4 weeks for sacral wound/osteomyelitis being followed by ID.  Around sacral wound with a slight erythema.  Patient has been pancultured.  Blood cultures growing methicillin-resistant coagulase-negative staph which per ID feel is a insignificant contaminant.  Continue IV vancomycin.  IV cefepime has been changed to IV Rocephin per ID.  IV metronidazole has been changed to metronidazole per G-tube.  ID recommended discharge on IV vancomycin, IV Rocephin and metronidazole per G-tube for total duration of 6 weeks with end date 01/07/2019.  PICC line ordered.  ID following and appreciate input and recommendations.  11.    Well controlled diabetes mellitus type 2 Hemoglobin A1c of 5.8.  CBG of 91 this morning.  Continue sliding scale insulin.     DVT prophylaxis: Lovenox Code Status: DNR Family Communication: Updated patient.  No family at bedside. Disposition Plan: Likely back to [facility] when medically stable and okay with ID.   Consultants:   ID: Dr. Ninetta LightsHatcher 11/23/2018  Palliative care: Dr. Linna DarnerAnwar 11/24/2018  Procedures:   PICC line removal 11/23/2018  CT head without contrast 11/23/2018  CT abdomen and pelvis 11/23/2018  Chest x-ray 11/23/2018  PICC line pending  Antimicrobials:   IV vancomycin started prior to admission for sacral osteomyelitis  IV cefepime 11/23/2018>>>>> 11/26/2018  IV Flagyl 11/23/2018  IV Rocephin 11/26/2018   Subjective: Patient alert.  Denies any chest pain or shortness of breath.  Objective: Vitals:   11/25/18 2019 11/25/18 2144 11/26/18 0450 11/26/18 0453  BP: (!) 149/120 (!) 164/110  (!) 127/93  Pulse: 97 91  95  Resp: 18   20  Temp: 98.8 F (37.1 C)   98.6 F (37 C)  TempSrc: Oral   Oral  SpO2: 100%   98%  Weight:   78.7 kg     Intake/Output Summary (Last 24 hours) at 11/26/2018 1224 Last data filed at 11/26/2018 0950 Gross per 24 hour  Intake 2516.35 ml  Output 1450 ml  Net 1066.35 ml   Filed  Weights   11/24/18 0643 11/25/18 0637 11/26/18 0450  Weight: 78 kg 79.4 kg 78.7 kg    Examination:  General exam: NAD Respiratory system: Lungs clear to auscultation anterior lung fields.  No wheezes, no crackles, no rhonchi.  Normal respiratory effort.  Cardiovascular system: RRR no murmurs rubs or gallops.  No JVD.  No lower extremity edema.   Gastrointestinal system: Abdomen is soft, nontender, nondistended, positive bowel sounds.  No rebound.  No guarding.  PEG in place.  Central nervous system: Alert. No focal neurological deficits. Extremities: Symmetric 5 x 5 power. Skin: Stage IV sacral decubitus ulcer/deep tissue injury right lateral heel, left medial heel.  Psychiatry: Judgement and insight appear normal. Mood & affect appropriate.     Data Reviewed: I have personally reviewed following labs and imaging studies  CBC: Recent Labs  Lab 11/23/18 0652 11/24/18 0427 11/25/18 0402  WBC 7.5 6.2 6.0  NEUTROABS 4.8  --  3.5  HGB 12.6* 11.6* 10.8*  HCT 39.9 38.0* 35.2*  MCV 99.3 101.6* 100.0  PLT 245 231 251   Basic Metabolic Panel: Recent Labs  Lab 11/23/18 0652 11/23/18 1100 11/24/18 0427 11/25/18 0402  NA 140  --  139 139  K 4.3  --  3.7 3.9  CL 102  --  107 106  CO2 26  --  23 24  GLUCOSE  114*  --  113* 89  BUN 19  --  13 14  CREATININE 0.66  --  0.62 0.67  CALCIUM 9.4  --  9.0 8.8*  MG  --  1.9 2.0 2.2   GFR: Estimated Creatinine Clearance: 85 mL/min (by C-G formula based on SCr of 0.67 mg/dL). Liver Function Tests: Recent Labs  Lab 11/23/18 0652 11/24/18 0427  AST 29 23  ALT 24 22  ALKPHOS 75 49  BILITOT 0.3 0.6  PROT 8.1 7.5  ALBUMIN 3.0* 2.9*   No results for input(s): LIPASE, AMYLASE in the last 168 hours. Recent Labs  Lab 11/23/18 0654  AMMONIA 27   Coagulation Profile: Recent Labs  Lab 11/23/18 0652  INR 1.1   Cardiac Enzymes: No results for input(s): CKTOTAL, CKMB, CKMBINDEX, TROPONINI in the last 168 hours. BNP (last 3  results) No results for input(s): PROBNP in the last 8760 hours. HbA1C: Recent Labs    11/24/18 0427  HGBA1C 5.8*   CBG: Recent Labs  Lab 11/25/18 1649 11/25/18 1841 11/25/18 2016 11/26/18 0736 11/26/18 1152  GLUCAP 80 128* 144* 91 129*   Lipid Profile: No results for input(s): CHOL, HDL, LDLCALC, TRIG, CHOLHDL, LDLDIRECT in the last 72 hours. Thyroid Function Tests: No results for input(s): TSH, T4TOTAL, FREET4, T3FREE, THYROIDAB in the last 72 hours. Anemia Panel: No results for input(s): VITAMINB12, FOLATE, FERRITIN, TIBC, IRON, RETICCTPCT in the last 72 hours. Sepsis Labs: Recent Labs  Lab 11/23/18 7782 11/23/18 0911  LATICACIDVEN 1.5 1.4    Recent Results (from the past 240 hour(s))  Blood Culture (routine x 2)     Status: Abnormal   Collection Time: 11/23/18  6:27 AM   Specimen: BLOOD LEFT HAND  Result Value Ref Range Status   Specimen Description   Final    BLOOD LEFT HAND Performed at [facility], [address], [place], [place] [address]    Special Requests   Final    BOTTLES DRAWN AEROBIC AND ANAEROBIC Blood Culture results may not be optimal due to an inadequate volume of blood received in culture bottles Performed at [facility], [address] [place], [place] [address]    Culture  Setup Time   Final    GRAM POSITIVE COCCI IN CLUSTERS ANAEROBIC BOTTLE ONLY CRITICAL RESULT CALLED TO, READ BACK BY AND VERIFIED WITH: PHARMD JUSTIN LEGGE [id number] FCP    Culture (A)  Final    STAPHYLOCOCCUS SPECIES (COAGULASE NEGATIVE) THE SIGNIFICANCE OF ISOLATING THIS ORGANISM FROM A SINGLE SET OF BLOOD CULTURES WHEN MULTIPLE SETS ARE DRAWN IS UNCERTAIN. PLEASE NOTIFY THE MICROBIOLOGY DEPARTMENT WITHIN ONE WEEK IF SPECIATION AND SENSITIVITIES ARE REQUIRED. Performed at [facility], [address] [place], [place] [address]    Report Status 11/26/2018 FINAL  Final  Blood Culture ID Panel (Reflexed)     Status: Abnormal    Collection Time: 11/23/18  6:27 AM  Result Value Ref Range Status   Enterococcus species NOT DETECTED NOT DETECTED Final   Listeria monocytogenes NOT DETECTED NOT DETECTED Final   Staphylococcus species DETECTED (A) NOT DETECTED Final    Comment: Methicillin (oxacillin) resistant coagulase negative staphylococcus. Possible blood culture contaminant (unless isolated from more than one blood culture draw or clinical case suggests pathogenicity). No antibiotic treatment is indicated for blood  culture contaminants. CRITICAL RESULT CALLED TO, READ BACK BY AND VERIFIED WITH: PHARMD JUSTIN LEGGE [id number] FCP    Staphylococcus aureus (BCID) NOT DETECTED NOT DETECTED Final  Methicillin resistance DETECTED (A) NOT DETECTED Final    Comment: CRITICAL RESULT CALLED TO, READ BACK BY AND VERIFIED WITH: PHARMD JUSTIN LEGGE [id number] FCP    Streptococcus species NOT DETECTED NOT DETECTED Final   Streptococcus agalactiae NOT DETECTED NOT DETECTED Final   Streptococcus pneumoniae NOT DETECTED NOT DETECTED Final   Streptococcus pyogenes NOT DETECTED NOT DETECTED Final   Acinetobacter baumannii NOT DETECTED NOT DETECTED Final   Enterobacteriaceae species NOT DETECTED NOT DETECTED Final   Enterobacter cloacae complex NOT DETECTED NOT DETECTED Final   Escherichia coli NOT DETECTED NOT DETECTED Final   Klebsiella oxytoca NOT DETECTED NOT DETECTED Final   Klebsiella pneumoniae NOT DETECTED NOT DETECTED Final   Proteus species NOT DETECTED NOT DETECTED Final   Serratia marcescens NOT DETECTED NOT DETECTED Final   Haemophilus influenzae NOT DETECTED NOT DETECTED Final   Neisseria meningitidis NOT DETECTED NOT DETECTED Final   Pseudomonas aeruginosa NOT DETECTED NOT DETECTED Final   Candida albicans NOT DETECTED NOT DETECTED Final   Candida glabrata NOT DETECTED NOT DETECTED Final   Candida krusei NOT DETECTED NOT DETECTED Final   Candida parapsilosis NOT DETECTED NOT DETECTED Final   Candida  tropicalis NOT DETECTED NOT DETECTED Final    Comment: Performed at [facility], [address] [place], [place] [address]  Blood Culture (routine x 2)     Status: None (Preliminary result)   Collection Time: 11/23/18  6:52 AM   Specimen: BLOOD LEFT FOREARM  Result Value Ref Range Status   Specimen Description   Final    BLOOD LEFT FOREARM Performed at [facility], [address], [place], [place] [address]    Special Requests   Final    BOTTLES DRAWN AEROBIC AND ANAEROBIC Blood Culture adequate volume Performed at [facility], [address], [place], [place] [address]    Culture   Final    NO GROWTH 3 DAYS Performed at [facility], [address], [place], [place] [address]    Report Status PENDING  Incomplete  Urine culture     Status: None   Collection Time: 11/23/18  8:59 AM   Specimen: In/Out Cath Urine  Result Value Ref Range Status   Specimen Description   Final    IN/OUT CATH URINE Performed at [facility], [address], [place], [place] [address]    Special Requests   Final    NONE Performed at [facility], [address] [place], [place] [address]    Culture   Final    NO GROWTH Performed at [facility], [address] [place], [place] [address]    Report Status 11/24/2018 FINAL  Final  SARS CORONAVIRUS 2 (TAT 6-24 HRS) Nasopharyngeal Nasopharyngeal Swab     Status: None   Collection Time: 11/23/18  8:59 AM   Specimen: Nasopharyngeal Swab  Result Value Ref Range Status   SARS Coronavirus 2 NEGATIVE NEGATIVE Final    Comment: (NOTE) SARS-CoV-2 target nucleic acids are NOT DETECTED. The SARS-CoV-2 RNA is generally detectable in upper and lower respiratory specimens during the acute phase of infection. Negative results do not preclude SARS-CoV-2 infection, do not rule out co-infections with other pathogens, and should not be used as the sole basis for treatment or other  patient management decisions. Negative results must be combined with clinical observations, patient history, and epidemiological information. The expected result is Negative. Fact Sheet for Patients: HairSlick.no Fact Sheet for  Healthcare Providers: quierodirigir.com This test is not yet approved or cleared by the [place] and  has been authorized for detection and/or diagnosis of SARS-CoV-2 by FDA under an Emergency Use Authorization (EUA). This EUA will remain  in effect (meaning this test can be used) for the duration of the COVID-19 declaration under Section 56 4(b)(1) of the Act, 21 U.S.C. section 360bbb-3(b)(1), unless the authorization is terminated or revoked sooner. Performed at [facility], [address], [place], [place] [address]          Radiology Studies: [place] Ekg Site Rite  Result Date: 11/25/2018 If [facility] image not attached, placement could not be confirmed due to current cardiac rhythm.       Scheduled Meds: . aspirin  81 mg Oral Daily  . Chlorhexidine Gluconate Cloth  6 each Topical Daily  . collagenase   Topical Daily  . donepezil  10 mg Oral QHS  . enoxaparin (LOVENOX) injection  40 mg Subcutaneous Q24H  . feeding supplement (PRO-STAT SUGAR FREE 64)  30 mL Oral BID BM  . insulin aspart  0-9 Units Subcutaneous TID WC  . levETIRAcetam  750 mg Oral BID  . losartan  50 mg Oral Daily  . metoprolol tartrate  12.5 mg Oral BID  . mupirocin ointment  1 application Nasal BID  . polyethylene glycol  17 g Oral BID  . senna  1 tablet Oral BID  . valproic acid  125 mg Per Tube BID   Continuous Infusions: . sodium chloride Stopped (11/26/18 0452)  . ertapenem 1,000 mg (11/26/18 1210)  . vancomycin Stopped (11/26/18 0435)     LOS: 2 days    Time spent: 35 minutes    Ramiro Harvest, MD [facility]  If 7PM-7AM, please contact night-coverage [email or web address] 11/26/2018,  12:24 PM

## 2018-11-26 NOTE — Progress Notes (Signed)
Pharmacy Antibiotic Note  Joshua Gonzales is a 78 y.o. male with sacral osteomyelitis on vancomycin PTA. Vancomycin continued on admission. ID also added Cefepime and Metronidazole. Cefepime transitioned to Ceftriaxone today per ID. Pharmacy consulted for Vancomycin dosing.   Today, 11/26/2018: - SCr 0.67, stable - afebrile - WBC WNL (9/21) - Vancomycin trough level = 21 mcg/mL (goal 15-20 mcg/mL)  Plan: - Decrease Vancomycin to 750mg  IV q12h - Ceftriaxone 2g IV q24h  - Metronidazole 500mg  per tube q8h - End date of antibiotics 01/07/2019 per ID recommendations (OPAT placed) _________________________________________  Temp (24hrs), Avg:98.7 F (37.1 C), Min:98.6 F (37 C), Max:98.8 F (37.1 C)  Recent Labs  Lab 11/23/18 0652 11/23/18 0911 11/23/18 1411 11/24/18 0427 11/25/18 0402 11/26/18 1551  WBC 7.5  --   --  6.2 6.0  --   CREATININE 0.66  --   --  0.62 0.67  --   LATICACIDVEN 1.5 1.4  --   --   --   --   VANCOTROUGH  --   --  21*  --   --  21*  VANCORANDOM  --  23  --   --   --   --     Estimated Creatinine Clearance: 85 mL/min (by C-G formula based on SCr of 0.67 mg/dL).    No Known Allergies   Thank you for allowing pharmacy to be a part of this patient's care.  Luiz Ochoa 11/26/2018 4:40 PM

## 2018-11-26 NOTE — Progress Notes (Signed)
Initial Nutrition Assessment  RD working remotely.   DOCUMENTATION CODES:   Not applicable  INTERVENTION:  - will order Ensure Enlive TID, each supplement provides 350 kcal and 20 grams of protein. - continue 30 mL Prostat BID, each supplement provides 100 kcal and 15 grams of protein. - continue to encourage PO intakes.    NUTRITION DIAGNOSIS:   Increased nutrient needs related to wound healing as evidenced by estimated needs.  GOAL:   Patient will meet greater than or equal to 90% of their needs  MONITOR:   PO intake, Supplement acceptance, Labs, Weight trends, Skin  REASON FOR ASSESSMENT:   Consult Wound healing  ASSESSMENT:   78 year old male resident of [facility] skilled nursing facility with medical history of Alzheimer's dementia, HTN, CVA with visual deficits, neurosyphilis that was treated, seizure disorder, and ongoing treatment with IV vancomycin for sacral osteomyelitis. He presented to the ED from [facility] on 9/19 due to worsening mental status. Noted that PICC line site had some erythema so PICC was removed per ID recommendation.  Per flow sheet, Patient consumed 25% of dinner on 9/19 (68 kcal, 0.5 grams protein); 50% of breakfast and 25% of dinner on 9/21 (total of 534 kcal, 19 grams protein); 50% of breakfast on 9/22 (372 kcal, 16 grams protein). Per flow sheet, patient is a/o to self only; unable to obtain any information from him. Prostat ordered BID and patient has accepted all packets offered to him.  Per chart review, current weight is 173 lb and weight on 7/27 was 161 lb. This indicates weight gain, but unsure if this was due to increase in PO intake or other factors.   Per notes: - acute metabolic encephalopathy--concern for infectious etiology - PICC replacement ordered by ID--single lumen PICC placed in R brachial  - dehydration - FTT--plan for back to SNF at time of d/c - constipation - sacral osteomyelitis/ ?cellulitis   Labs reviewed;  CBGs: 91 and 129 mg/dl. Medications reviewed; sliding scale novolog, 1 packet miralax BID. IVF; NS @ 75 ml/hr.      NUTRITION - FOCUSED PHYSICAL EXAM:  unable to complete at this time.   Diet Order:   Diet Order            DIET SOFT Room service appropriate? Yes; Fluid consistency: Thin  Diet effective now              EDUCATION NEEDS:   No education needs have been identified at this time  Skin:  Skin Assessment: Skin Integrity Issues: Skin Integrity Issues:: Stage IV, Unstageable Stage IV: sacrum Unstageable: full thickness to bilateral heels  Last BM:  9/22  Height:   Ht Readings from Last 1 Encounters:  09/30/18 5\' 11"  (1.803 m)    Weight:   Wt Readings from Last 1 Encounters:  11/26/18 78.7 kg    Ideal Body Weight:  78.2 kg  BMI:  Body mass index is 24.18 kg/m.  Estimated Nutritional Needs:   Kcal:  2300-2500 kcal  Protein:  120-130 grams  Fluid:  >/= 2.3 L/day     Jarome Matin, MS, RD, LDN, [facility] Inpatient Clinical Dietitian Pager # [phone] After hours/weekend pager # [phone]

## 2018-11-26 NOTE — Progress Notes (Addendum)
PHARMACY CONSULT NOTE FOR:  OUTPATIENT  PARENTERAL ANTIBIOTIC THERAPY (OPAT)  Indication: osteomyelitis of sacrum Regimen: vancomycin 750mg  IV q12h, ceftriaxone 2gm IV q24h (plus metronidazole suspension 500mg  per tube TID) End date: 01/07/2019  Initial plan for ertapenem or meropenem changed d/t interaction of carbapenem and depakote  (significant interaction that decreased valproic acid levels)  IV antibiotic discharge orders are pended. To discharging provider:  please sign these orders via discharge navigator,  Select New Orders & click on the button choice - Manage This Unsigned Work.     Thank you for allowing pharmacy to be a part of this patient's care.  Doreene Eland, PharmD, BCPS.   Work Cell: [phone] 11/26/2018 2:58 PM

## 2018-11-26 NOTE — TOC Progression Note (Signed)
Transition of Care [facility]) - Progression Note    Patient Details  Name: Joshua Gonzales MRN: [id number] Date of Birth: Feb 28, 1941  Transition of [facility]) CM/SW Contact  Purcell Mouton, RN Phone Number: 11/26/2018, 2:22 PM  Clinical Narrative:    Pt is from [facility] and plan to return at discharge.         Expected Discharge Plan and Services                                                 Social Determinants of Health (SDOH) Interventions    Readmission Risk Interventions No flowsheet data found.

## 2018-11-26 NOTE — Progress Notes (Signed)
CRITICAL VALUE ALERT  Critical Value:  Vancomycin trough with a value of 21  Date & Time Notied:  11/26/18 1639  Provider Notified: D. Grandville Silos  Orders Received/Actions taken: Awaiting orders

## 2018-11-26 NOTE — Progress Notes (Signed)
Pharmacy made aware of critical vancomycin trough by this RN.

## 2018-11-26 NOTE — Progress Notes (Addendum)
Patient ID: Joshua Gonzales, male   DOB: October 30, 1940, 78 y.o.   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  He was being treated with IV vancomycin and was admitted to the hospital 2 days ago because of worsening mental status.  He has dementia at baseline.  1 of 2 admission blood cultures has grown methicillin-resistant coagulase-negative staph.  That probably represents an insignificant contaminant but it is possible that he may have had transient bacteremia related to his PICC which was removed.    The palliative care note indicates that Joshua Gonzales niece revoked his hospice certification in July and that she wanted to continue aggressive care.  I am comfortable having a new PICC placed now.    I am not sure where he currently is relative to his baseline mental status.  It is possible that he had some further deterioration due to his sacrococcygeal osteomyelitis.  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  I will simplify his regimen to vancomycin, ceftriaxone and metronidazole.  I will avoid using a Carbapenem because of an adverse drug interaction with Depakote.   PLAN: 1. Continue vancomycin 2. Change cefepime to ceftriaxone 3. Administer metronidazole per G-tube 4. PICC placement 5. I will sign off now  Diagnosis: Sacrococcygeal osteomyelitis  Culture Result: None available  No Known Allergies  OPAT Orders Discharge antibiotics: Per pharmacy protocol IV vancomycin, IV ceftriaxone and metronidazole per G-tube Aim for Vancomycin trough 15-20 (unless otherwise indicated) Duration: 6 weeks End Date: 01/07/2019  [facility] Care Per Protocol:  Labs weekly while on IV antibiotics: _x_ CBC with  differential _x_ BMP (Twice weekly) __ CMP _x_ CRP _x_ ESR _x_ Vancomycin trough __ CK  _x_ Please pull PIC at completion of IV antibiotics __ Please leave PIC in place until doctor has seen patient or been notified  Fax weekly labs to [phone]  Clinic Follow Up Appt: 01/07/2019  Principal Problem:   Acute metabolic encephalopathy Active Problems:   Essential hypertension, benign   History of seizures   Dementia with behavioral disturbance (HCC)   PAF (paroxysmal atrial fibrillation) ([place])   Type 2 diabetes mellitus without complication ([place])   History of CVA with residual deficit   FTT (failure to thrive) in adult   Cellulitis   Osteomyelitis (HCC)   Constipation   Pressure injury of skin   Osteomyelitis of vertebra, sacral and sacrococcygeal region ([place])   Scheduled Meds: . aspirin  81 mg Oral Daily  . Chlorhexidine Gluconate Cloth  6 each Topical Daily  . collagenase   Topical Daily  . donepezil  10 mg Oral QHS  . enoxaparin (LOVENOX) injection  40 mg Subcutaneous Q24H  . feeding supplement (PRO-STAT SUGAR FREE 64)  30 mL Oral BID BM  . insulin aspart  0-9 Units Subcutaneous TID WC  . levETIRAcetam  750 mg Oral BID  . losartan  50 mg Oral Daily  . metoprolol tartrate  12.5 mg Oral BID  . mupirocin ointment  1 application Nasal BID  . polyethylene glycol  17 g Oral BID  . senna  1 tablet Oral BID  . valproic acid  125 mg Per Tube BID   Continuous Infusions: . sodium chloride Stopped (11/26/18 0452)  . ceFEPime (MAXIPIME) IV 200 mL/hr at 11/26/18 0504  . metronidazole 500 mg (11/26/18 0809)  . vancomycin Stopped (11/26/18 0435)   PRN Meds:.acetaminophen **OR** acetaminophen, albuterol, hydrALAZINE, lactulose, ondansetron **OR** ondansetron (ZOFRAN) IV, sodium chloride flush, sodium phosphate, sorbitol, traMADol   Review of Systems: Review of Systems  Unable to perform ROS: Dementia    No Known Allergies  OBJECTIVE: Vitals:   11/25/18 2019  11/25/18 2144 11/26/18 0450 11/26/18 0453  BP: (!) 149/120 (!) 164/110  (!) 127/93  Pulse: 97 91  95  Resp: 18   20  Temp: 98.8 F (37.1 C)   98.6 F (37 C)  TempSrc: Oral   Oral  SpO2: 100%   98%  Weight:   78.7 kg    Body mass index is 24.18 kg/m.  Physical Exam Constitutional:      Comments: He opens his eyes to voice but does not answer questions.       Lab Results Lab Results  Component Value Date   WBC 6.0 11/25/2018   HGB 10.8 (L) 11/25/2018   HCT 35.2 (L) 11/25/2018   MCV 100.0 11/25/2018   PLT 251 11/25/2018    Lab Results  Component Value Date   CREATININE 0.67 11/25/2018   BUN 14 11/25/2018   NA 139 11/25/2018   K 3.9 11/25/2018   CL 106 11/25/2018   CO2 24 11/25/2018    Lab Results  Component Value Date   ALT 22 11/24/2018   AST 23 11/24/2018   ALKPHOS 49 11/24/2018   BILITOT 0.6 11/24/2018     Microbiology: Recent Results (from the past 240 hour(s))  Blood Culture (routine x 2)     Status: Abnormal   Collection Time: 11/23/18  6:27 AM   Specimen: BLOOD LEFT HAND  Result Value Ref Range Status   Specimen Description   Final    BLOOD LEFT HAND Performed at [facility], [address] [place], [place] [address]    Special Requests   Final    BOTTLES DRAWN AEROBIC AND ANAEROBIC Blood Culture results may not be optimal due to an inadequate volume of blood received in culture bottles Performed at [facility] [address], [place], [place] [address]    Culture  Setup Time   Final    GRAM POSITIVE COCCI IN CLUSTERS ANAEROBIC BOTTLE ONLY CRITICAL RESULT CALLED TO, READ BACK BY AND VERIFIED WITH: PHARMD JUSTIN LEGGE [phone] FCP    Culture (A)  Final    STAPHYLOCOCCUS SPECIES (COAGULASE NEGATIVE) THE SIGNIFICANCE OF ISOLATING THIS ORGANISM FROM A SINGLE SET OF BLOOD CULTURES WHEN MULTIPLE SETS ARE DRAWN IS UNCERTAIN. PLEASE NOTIFY THE MICROBIOLOGY DEPARTMENT WITHIN ONE WEEK IF SPECIATION AND SENSITIVITIES ARE  REQUIRED. Performed at [facility], [place] [address] [place], [place] [address]    Report Status 11/26/2018 FINAL  Final  Blood Culture ID Panel (Reflexed)     Status: Abnormal   Collection Time: 11/23/18  6:27 AM  Result Value Ref Range Status   Enterococcus species NOT DETECTED NOT DETECTED Final   Listeria monocytogenes NOT DETECTED NOT DETECTED Final   Staphylococcus species DETECTED (A) NOT DETECTED Final    Comment: Methicillin (oxacillin) resistant coagulase negative staphylococcus. Possible blood culture contaminant (unless isolated from more than one blood culture draw or clinical case suggests pathogenicity). No antibiotic treatment is indicated for blood  culture contaminants. CRITICAL  RESULT CALLED TO, READ BACK BY AND VERIFIED WITH: PHARMD JUSTIN LEGGE [phone] [id number] FCP    Staphylococcus aureus (BCID) NOT DETECTED NOT DETECTED Final   Methicillin resistance DETECTED (A) NOT DETECTED Final    Comment: CRITICAL RESULT CALLED TO, READ BACK BY AND VERIFIED WITH: PHARMD JUSTIN LEGGE [phone] FCP    Streptococcus species NOT DETECTED NOT DETECTED Final   Streptococcus agalactiae NOT DETECTED NOT DETECTED Final   Streptococcus pneumoniae NOT DETECTED NOT DETECTED Final   Streptococcus pyogenes NOT DETECTED NOT DETECTED Final   Acinetobacter baumannii NOT DETECTED NOT DETECTED Final   Enterobacteriaceae species NOT DETECTED NOT DETECTED Final   Enterobacter cloacae complex NOT DETECTED NOT DETECTED Final   Escherichia coli NOT DETECTED NOT DETECTED Final   Klebsiella oxytoca NOT DETECTED NOT DETECTED Final   Klebsiella pneumoniae NOT DETECTED NOT DETECTED Final   Proteus species NOT DETECTED NOT DETECTED Final   Serratia marcescens NOT DETECTED NOT DETECTED Final   Haemophilus influenzae NOT DETECTED NOT DETECTED Final   Neisseria meningitidis NOT DETECTED NOT DETECTED Final   Pseudomonas aeruginosa NOT DETECTED NOT DETECTED Final   Candida albicans NOT DETECTED NOT  DETECTED Final   Candida glabrata NOT DETECTED NOT DETECTED Final   Candida krusei NOT DETECTED NOT DETECTED Final   Candida parapsilosis NOT DETECTED NOT DETECTED Final   Candida tropicalis NOT DETECTED NOT DETECTED Final    Comment: Performed at [facility], [address] [place], [place] [address]  Blood Culture (routine x 2)     Status: None (Preliminary result)   Collection Time: 11/23/18  6:52 AM   Specimen: BLOOD LEFT FOREARM  Result Value Ref Range Status   Specimen Description   Final    BLOOD LEFT FOREARM Performed at [facility], [place] [address] [place], [place] [address]    Special Requests   Final    BOTTLES DRAWN AEROBIC AND ANAEROBIC Blood Culture adequate volume Performed at [facility] [address], [place], [place] [address]    Culture   Final    NO GROWTH 3 DAYS Performed at [facility], [place] [address], [place], [place] [address]    Report Status PENDING  Incomplete  Urine culture     Status: None   Collection Time: 11/23/18  8:59 AM   Specimen: In/Out Cath Urine  Result Value Ref Range Status   Specimen Description   Final    IN/OUT CATH URINE Performed at [facility] [address], [place], [place] [address]    Special Requests   Final    NONE Performed at [facility], [place] [address], [place], [place] [address]    Culture   Final    NO GROWTH Performed at [facility], [place] [address] [place], [place] [address]    Report Status 11/24/2018 FINAL  Final  SARS CORONAVIRUS 2 (TAT 6-24 HRS) Nasopharyngeal Nasopharyngeal Swab     Status: None   Collection Time: 11/23/18  8:59 AM   Specimen: Nasopharyngeal Swab  Result Value Ref Range Status   SARS Coronavirus 2 NEGATIVE NEGATIVE Final    Comment: (NOTE) SARS-CoV-2 target nucleic acids are NOT DETECTED. The SARS-CoV-2 RNA is generally detectable in upper and lower respiratory specimens during the acute phase of  infection. Negative results do not preclude SARS-CoV-2 infection, do not rule out co-infections with other pathogens, and should not be used as the sole basis for treatment or other patient  management decisions. Negative results must be combined with clinical observations, patient history, and epidemiological information. The expected result is Negative. Fact Sheet for Patients: SugarRoll.be Fact Sheet for Healthcare Providers: https://www.woods-mathews.com/ This test is not yet approved or cleared by the [place] FDA and  has been authorized for detection and/or diagnosis of SARS-CoV-2 by FDA under an Emergency Use Authorization (EUA). This EUA will remain  in effect (meaning this test can be used) for the duration of the COVID-19 declaration under Section 56 4(b)(1) of the Act, 21 U.S.C. section 360bbb-3(b)(1), unless the authorization is terminated or revoked sooner. Performed at [facility], [place] [address] [place], [place] [address]     Michel Bickers, [facility] [phone] pager   [phone] cell 11/26/2018, 10:10 AM

## 2018-11-26 NOTE — Progress Notes (Signed)
Peripherally Inserted Central Catheter/Midline Placement  The IV Nurse has discussed with the patient and/or persons authorized to consent for the patient, the purpose of this procedure and the potential benefits and risks involved with this procedure.  The benefits include less needle sticks, lab draws from the catheter, and the patient may be discharged home with the catheter. Risks include, but not limited to, infection, bleeding, blood clot (thrombus formation), and puncture of an artery; nerve damage and irregular heartbeat and possibility to perform a PICC exchange if needed/ordered by physician.  Alternatives to this procedure were also discussed.  B[facility] Power PICC patient education guide, fact sheet on infection prevention and patient information card has been provided to patient /or left at bedside.    PICC/Midline Placement Documentation  PICC Single Lumen 11/26/18 PICC Right Brachial 40 cm 0 cm (Active)  Indication for Insertion or Continuance of Line Home intravenous therapies (PICC only) 11/26/18 1400  Exposed Catheter (cm) 0 cm 11/26/18 1400  Site Assessment Clean;Dry;Intact 11/26/18 1400  Line Status Flushed;Blood return noted;Saline locked 11/26/18 1400  Dressing Type Transparent;Securing device 11/26/18 1400  Dressing Status Clean;Dry;Intact;Antimicrobial disc in place 11/26/18 1400  Line Care Connections checked and tightened 11/26/18 1400  Dressing Intervention New dressing;Other (Comment) 11/26/18 1400  Dressing Change Due 12/03/18 11/26/18 1400    Phone consent obtained from niece Dahlia Byes.   Virgilio Belling 11/26/2018, 2:43 PM

## 2018-11-27 LAB — CBC
HCT: 36 % — ABNORMAL LOW (ref 39.0–52.0)
Hemoglobin: 11.1 g/dL — ABNORMAL LOW (ref 13.0–17.0)
MCH: 31 pg (ref 26.0–34.0)
MCHC: 30.8 g/dL (ref 30.0–36.0)
MCV: 100.6 fL — ABNORMAL HIGH (ref 80.0–100.0)
Platelets: 281 10*3/uL (ref 150–400)
RBC: 3.58 MIL/uL — ABNORMAL LOW (ref 4.22–5.81)
RDW: 15.5 % (ref 11.5–15.5)
WBC: 6.3 10*3/uL (ref 4.0–10.5)
nRBC: 0 % (ref 0.0–0.2)

## 2018-11-27 LAB — BASIC METABOLIC PANEL
Anion gap: 8 (ref 5–15)
BUN: 9 mg/dL (ref 8–23)
CO2: 26 mmol/L (ref 22–32)
Calcium: 8.9 mg/dL (ref 8.9–10.3)
Chloride: 105 mmol/L (ref 98–111)
Creatinine, Ser: 0.71 mg/dL (ref 0.61–1.24)
GFR calc Af Amer: >60 mL/min (ref >60)
GFR calc non Af Amer: >60 mL/min (ref >60)
Glucose, Bld: 78 mg/dL (ref 70–99)
Potassium: 3.2 mmol/L — ABNORMAL LOW (ref 3.5–5.1)
Sodium: 139 mmol/L (ref 135–145)

## 2018-11-27 LAB — GLUCOSE, CAPILLARY
Glucose-Capillary: 62 mg/dL — ABNORMAL LOW (ref 70–99)
Glucose-Capillary: 99 mg/dL (ref 70–99)
Glucose-Capillary: 90 mg/dL (ref 70–99)
Glucose-Capillary: 103 mg/dL — ABNORMAL HIGH (ref 70–99)

## 2018-11-27 NOTE — Progress Notes (Signed)
PROGRESS NOTE    Joshua [place]  ZOX:[id number] DOB: June 06, 1940 DOA: 11/23/2018 PCP: Seward Carol, MD   Chief complaint: worsening confusion   Brief Narrative:  Patient 78 year old male resident of [facility] skilled nursing facility history of Alzheimer's dementia, hypertension, CVA with visual deficits, history of neurosyphilis that was treated, seizure disorder, ongoing treatment with IV vancomycin for sacral osteomyelitis who was transferred from [facility] home due to worsening mental status.  Patient admitted.  Patient pancultured.  Patient placed empirically on IV antibiotics.  Site of right PICC line with some erythema and PICC line removed per recommendations from ID.   ID following.  -------------------------------------------------------------------------------------------------------------------------   Subjective: The patient was seen and examined this morning, demented at baseline, able to voice his name.  In no distress.  Per nursing staff no issues overnight.     Assessment & Plan:   Principal Problem:   Acute metabolic encephalopathy Active Problems:   Essential hypertension, benign   History of seizures   Dementia with behavioral disturbance (HCC)   PAF (paroxysmal atrial fibrillation) (HCC)   Type 2 diabetes mellitus without complication (HCC)   History of CVA with residual deficit   FTT (failure to thrive) in adult   Cellulitis   Osteomyelitis (HCC)   Constipation   Pressure injury of skin   Osteomyelitis of vertebra, sacral and sacrococcygeal region ([place])  1. Acute metabolic encephalopathy -Likely at baseline now, awake alert, able to voice his name Per nursing staff tolerating p.o., -Severely demented, likely baseline alert and oriented x1   Patient presenting from nursing home with progressive worsening mental status.  Patient with a history of Alzheimer's dementia.  Patient was initially drowsy and not answering questions on  admission.  Concern for infectious etiology.  - Patient improving clinically.   -Patient however afebrile, normal white count, normal lactic acid level.   On presentation to the ED patient initially noted to be tachycardic with a heart rate of 120 which improved with hydration.  Patient prior to admission was on IV vancomycin for sacral osteomyelitis.   Patient has been pancultured in the ED with Blood cultures with methicillin-resistant coagulase-negative staph. ?  Contaminant -Repeat blood culture 11/23/2018 >>> no growth to date -Urine culture 11/23/2018 >>> no growth to date  - Per ID patient may have had a transient bacteremia related to PICC line which was removed.  - Head CT negative for any acute abnormalities.   Discontinued EEG order.  PICC line reordered per ID.   -Continue current regimen of IV vancomycin.  IV cefepime has been narrowed down to IV Rocephin and IV Flagyl changed to metronidazole per G-tube per ID recommendations.  ID recommending 6 weeks of antibiotics.   Continue supportive care.   ID following and appreciate input and recommendations.    2.  History of seizures -Remained to be stable, no seizures on this admission - Continue home regimen Keppra.  Patient also on Depakote.   3.  Dehydration Continue gentle IV fluids.   4.  Paroxysmal atrial fibrillation -Remained to be stable, Heart rate improved with hydration.  Currently rate controlled.   -Continue aspirin for anticoagulation.  5.  History of neurosyphilis -Finished treatment in 2014.  - Outpatient follow-up.  6.  Failure to thrive -Per nursing staff tolerating p.o. Patient on long-term SNF resident, baseline bedbound needing total care and to be fed.  Patient sent to the ED with worsening mental status changes.  Head CT negative.  EEG initially ordered however discontinued  as patient improved clinically with hydration and IV antibiotics. Blood cultures with methicillin-resistant coagulase-negative  staph likely insignificant contaminant per ID.  Continue current IV antibiotics.  Palliative care following.  Likely back to skilled nursing facility with palliative care following.    7.  Dementia Continue home regimen Aricept.  8.  History of CVA Continue aspirin for secondary stroke prophylaxis.   9.  Constipation Continue current regimen of MiraLAX twice daily and Senokot-S twice daily.  Soapsuds enema was ordered on admission and patient noted to have bowel movement.  Continue current bowel regimen.   10.    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  Cellulitis POA Patient was on IV vancomycin at facility to be treated for 4 weeks for sacral wound/osteomyelitis being followed by ID.  Around sacral wound with a slight erythema.  Patient has been pancultured.  Blood cultures growing methicillin-resistant coagulase-negative staph which per ID feel is a insignificant contaminant.  Continue IV vancomycin.  IV cefepime has been changed to IV Rocephin per ID.  IV metronidazole has been changed to metronidazole per G-tube.    ID recommended discharge on IV vancomycin, IV Rocephin and metronidazole per G-tube for total duration of 6 weeks with end date 01/07/2019.  Weekly labs CBC, BMP, CRP, ESR, Vanco trough goal 15-20 Fax weekly labs to [phone] Follow-up ID appointment 01/07/2019   PICC line ordered on 11/26/2018.   ID following and appreciate input and recommendations.  19.    Well controlled diabetes mellitus type 2 Hemoglobin A1c of 5.8.  CBG of 91 this morning.  Continue sliding scale insulin.     DVT prophylaxis: Lovenox Code Status: DNR Family Communication: Updated patient.  No family at bedside. Disposition Plan: Likely back to [facility] skilled nursing facility when medically stable and okay with ID.   Consultants:   ID: Dr. Johnnye Sima 11/23/2018  Palliative care: Dr. Rowe Pavy 11/24/2018  Procedures:   PICC line removal 11/23/2018  CT head without  contrast 11/23/2018  CT abdomen and pelvis 11/23/2018  Chest x-ray 11/23/2018  PICC line pending  Antimicrobials:   IV vancomycin started prior to admission for sacral osteomyelitis >> 01/07/19  IV cefepime 11/23/2018>>>>> 11/26/2018  Per PEG tube Flagyl 11/23/2018  >>> 01/07/2019  IV Rocephin 11/26/2018  >>>01/07/19   Objective: Vitals:   11/26/18 1821 11/26/18 2227 11/27/18 0559 11/27/18 1250  BP:  (!) 151/97 (!) 156/92 (!) 143/96  Pulse:  93 76 91  Resp:  16 18 20   Temp: 100.3 F (37.9 C) 97.8 F (36.6 C) 97.7 F (36.5 C) 97.9 F (36.6 C)  TempSrc:  Oral Oral Oral  SpO2:  99% 98% 91%  Weight:        Intake/Output Summary (Last 24 hours) at 11/27/2018 1506 Last data filed at 11/27/2018 1300 Gross per 24 hour  Intake 1867.2 ml  Output 1600 ml  Net 267.2 ml   Filed Weights   11/24/18 0643 11/25/18 0637 11/26/18 0450  Weight: 78 kg 79.4 kg 78.7 kg    Examination: BP (!) 143/96 (BP Location: Left Arm)   Pulse 91   Temp 97.9 F (36.6 C) (Oral)   Resp 20   Wt 78.7 kg   SpO2 91%   BMI 24.18 kg/m    Physical Exam  Constitution: Confused alert oriented x1 Psychiatric: Withdrawn only few words HEENT: Normocephalic, PERRL, otherwise with in Normal limits  Chest:Chest symmetric Cardio vascular:  S1/S2, RRR, No murmure, No Rubs or Gallops  pulmonary: Clear to auscultation bilaterally, respirations unlabored,  negative wheezes / crackles Abdomen: Soft, non-tender, non-distended, bowel sounds,no masses, no organomegaly Muscular skeletal: Limited exam -able to withdrawal all 4 extremities in bed,  Neuro: CNII-XII intact. , normal motor and sensation, reflexes intact  Extremities: No pitting edema lower extremities, +2 pulses  Skin: Dry, warm to touch, negative for any Rashes,  Wounds:  Stage IV sacral decubitus ulcer/deep tissue injury right lateral heel, left medial heel.       Data Reviewed: I have personally reviewed following labs and imaging studies  CBC:  Recent Labs  Lab 11/23/18 0652 11/24/18 0427 11/25/18 0402 11/27/18 0437  WBC 7.5 6.2 6.0 6.3  NEUTROABS 4.8  --  3.5  --   HGB 12.6* 11.6* 10.8* 11.1*  HCT 39.9 38.0* 35.2* 36.0*  MCV 99.3 101.6* 100.0 100.6*  PLT 245 231 251 841   Basic Metabolic Panel: Recent Labs  Lab 11/23/18 0652 11/23/18 1100 11/24/18 0427 11/25/18 0402 11/27/18 0437  NA 140  --  139 139 139  K 4.3  --  3.7 3.9 3.2*  CL 102  --  107 106 105  CO2 26  --  23 24 26   GLUCOSE 114*  --  113* 89 78  BUN 19  --  13 14 9   CREATININE 0.66  --  0.62 0.67 0.71  CALCIUM 9.4  --  9.0 8.8* 8.9  MG  --  1.9 2.0 2.2  --    GFR: Estimated Creatinine Clearance: 85 mL/min (by C-G formula based on SCr of 0.71 mg/dL). Liver Function Tests: Recent Labs  Lab 11/23/18 0652 11/24/18 0427  AST 29 23  ALT 24 22  ALKPHOS 75 49  BILITOT 0.3 0.6  PROT 8.1 7.5  ALBUMIN 3.0* 2.9*   No results for input(s): LIPASE, AMYLASE in the last 168 hours. Recent Labs  Lab 11/23/18 0654  AMMONIA 27   Coagulation Profile: Recent Labs  Lab 11/23/18 0652  INR 1.1  CBG: Recent Labs  Lab 11/26/18 1738 11/26/18 2121 11/27/18 0734 11/27/18 0837 11/27/18 1125  GLUCAP 117* 102* 62* 99 90   Sepsis Labs: Recent Labs  Lab 11/23/18 0652 11/23/18 0911  LATICACIDVEN 1.5 1.4    Recent Results (from the past 240 hour(s))  Blood Culture (routine x 2)     Status: Abnormal   Collection Time: 11/23/18  6:27 AM   Specimen: BLOOD LEFT HAND  Result Value Ref Range Status   Specimen Description   Final    BLOOD LEFT HAND Performed at [facility], [place] [address] [place], [place] [address]    Special Requests   Final    BOTTLES DRAWN AEROBIC AND ANAEROBIC Blood Culture results may not be optimal due to an inadequate volume of blood received in culture bottles Performed at [facility] [address] [place], [place] [address]    Culture  Setup Time   Final    GRAM POSITIVE COCCI IN CLUSTERS  ANAEROBIC BOTTLE ONLY CRITICAL RESULT CALLED TO, READ BACK BY AND VERIFIED WITH: PHARMD JUSTIN LEGGE [phone] FCP    Culture (A)  Final    STAPHYLOCOCCUS SPECIES (COAGULASE NEGATIVE) THE SIGNIFICANCE OF ISOLATING THIS ORGANISM FROM A SINGLE SET OF BLOOD CULTURES WHEN MULTIPLE SETS ARE DRAWN IS UNCERTAIN. PLEASE NOTIFY THE MICROBIOLOGY DEPARTMENT WITHIN ONE WEEK IF SPECIATION AND SENSITIVITIES ARE REQUIRED. Performed at [facility], [place] [address] [place], [place] [address]    Report Status 11/26/2018 FINAL  Final  Blood Culture ID Panel (Reflexed)  Status: Abnormal   Collection Time: 11/23/18  6:27 AM  Result Value Ref Range Status   Enterococcus species NOT DETECTED NOT DETECTED Final   Listeria monocytogenes NOT DETECTED NOT DETECTED Final   Staphylococcus species DETECTED (A) NOT DETECTED Final    Comment: Methicillin (oxacillin) resistant coagulase negative staphylococcus. Possible blood culture contaminant (unless isolated from more than one blood culture draw or clinical case suggests pathogenicity). No antibiotic treatment is indicated for blood  culture contaminants. CRITICAL RESULT CALLED TO, READ BACK BY AND VERIFIED WITH: PHARMD JUSTIN LEGGE [phone] FCP    Staphylococcus aureus (BCID) NOT DETECTED NOT DETECTED Final   Methicillin resistance DETECTED (A) NOT DETECTED Final    Comment: CRITICAL RESULT CALLED TO, READ BACK BY AND VERIFIED WITH: PHARMD JUSTIN LEGGE [phone] FCP    Streptococcus species NOT DETECTED NOT DETECTED Final   Streptococcus agalactiae NOT DETECTED NOT DETECTED Final   Streptococcus pneumoniae NOT DETECTED NOT DETECTED Final   Streptococcus pyogenes NOT DETECTED NOT DETECTED Final   Acinetobacter baumannii NOT DETECTED NOT DETECTED Final   Enterobacteriaceae species NOT DETECTED NOT DETECTED Final   Enterobacter cloacae complex NOT DETECTED NOT DETECTED Final   Escherichia coli NOT DETECTED NOT DETECTED Final   Klebsiella oxytoca  NOT DETECTED NOT DETECTED Final   Klebsiella pneumoniae NOT DETECTED NOT DETECTED Final   Proteus species NOT DETECTED NOT DETECTED Final   Serratia marcescens NOT DETECTED NOT DETECTED Final   Haemophilus influenzae NOT DETECTED NOT DETECTED Final   Neisseria meningitidis NOT DETECTED NOT DETECTED Final   Pseudomonas aeruginosa NOT DETECTED NOT DETECTED Final   Candida albicans NOT DETECTED NOT DETECTED Final   Candida glabrata NOT DETECTED NOT DETECTED Final   Candida krusei NOT DETECTED NOT DETECTED Final   Candida parapsilosis NOT DETECTED NOT DETECTED Final   Candida tropicalis NOT DETECTED NOT DETECTED Final    Comment: Performed at [facility], [place]. [address], [place], [place] [address]  Blood Culture (routine x 2)     Status: None (Preliminary result)   Collection Time: 11/23/18  6:52 AM   Specimen: BLOOD LEFT FOREARM  Result Value Ref Range Status   Specimen Description   Final    BLOOD LEFT FOREARM Performed at [facility], [place] [address] [place], [place] [address]    Special Requests   Final    BOTTLES DRAWN AEROBIC AND ANAEROBIC Blood Culture adequate volume Performed at [facility] [address], [place], [place] [address]    Culture   Final    NO GROWTH 4 DAYS Performed at [facility], [place] [address], [place], [place] [address]    Report Status PENDING  Incomplete  Urine culture     Status: None   Collection Time: 11/23/18  8:59 AM   Specimen: In/Out Cath Urine  Result Value Ref Range Status   Specimen Description   Final    IN/OUT CATH URINE Performed at [facility] [address] [place], [place] [address]    Special Requests   Final    NONE Performed at [facility], [place] [address] [place], [place] [address]    Culture   Final    NO GROWTH Performed at [facility], [place] [address], [place], [place] [address]    Report Status 11/24/2018 FINAL  Final  SARS  CORONAVIRUS 2 (TAT 6-24 HRS) Nasopharyngeal Nasopharyngeal Swab     Status: None   Collection Time:  11/23/18  8:59 AM   Specimen: Nasopharyngeal Swab  Result Value Ref Range Status   SARS Coronavirus 2 NEGATIVE NEGATIVE Final    Comment: (NOTE) SARS-CoV-2 target nucleic acids are NOT DETECTED. The SARS-CoV-2 RNA is generally detectable in upper and lower respiratory specimens during the acute phase of infection. Negative results do not preclude SARS-CoV-2 infection, do not rule out co-infections with other pathogens, and should not be used as the sole basis for treatment or other patient management decisions. Negative results must be combined with clinical observations, patient history, and epidemiological information. The expected result is Negative. Fact Sheet for Patients: SugarRoll.be Fact Sheet for Healthcare Providers: https://www.woods-mathews.com/ This test is not yet approved or cleared by the [place] FDA and  has been authorized for detection and/or diagnosis of SARS-CoV-2 by FDA under an Emergency Use Authorization (EUA). This EUA will remain  in effect (meaning this test can be used) for the duration of the COVID-19 declaration under Section 56 4(b)(1) of the Act, 21 U.S.C. section 360bbb-3(b)(1), unless the authorization is terminated or revoked sooner. Performed at [facility], [place] [address] [place], [place] [address]          Radiology Studies: [place] Ekg Site Rite  Result Date: 11/25/2018 If [facility] image not attached, placement could not be confirmed due to current cardiac rhythm.       Scheduled Meds: . aspirin  81 mg Oral Daily  . Chlorhexidine Gluconate Cloth  6 each Topical Daily  . collagenase   Topical Daily  . donepezil  10 mg Oral QHS  . enoxaparin (LOVENOX) injection  40 mg Subcutaneous Q24H  . feeding supplement (ENSURE ENLIVE)  237 mL Oral TID BM  . feeding supplement (PRO-STAT SUGAR  FREE 64)  30 mL Oral BID BM  . insulin aspart  0-9 Units Subcutaneous TID WC  . levETIRAcetam  750 mg Oral BID  . losartan  50 mg Oral Daily  . metoprolol tartrate  12.5 mg Oral BID  . metroNIDAZOLE  500 mg Per Tube TID  . multivitamin with minerals  1 tablet Oral Daily  . mupirocin ointment  1 application Nasal BID  . polyethylene glycol  17 g Oral BID  . senna  1 tablet Oral BID  . sodium chloride flush  10-40 mL Intracatheter Q12H  . valproic acid  125 mg Per Tube BID   Continuous Infusions: . sodium chloride 1,000 mL (11/27/18 0525)  . cefTRIAXone (ROCEPHIN)  IV 2 g (11/27/18 1131)  . vancomycin       LOS: 3 days    Time spent: 35 minutes    Deatra James, MD [facility]  If 7PM-7AM, please contact night-coverage [email or web address] 11/27/2018, 3:06 PM

## 2018-11-27 NOTE — Care Management Important Message (Signed)
Important Message  Patient Details IM Letter given to Cookie McGibboney RN to present to the Patient Name: Joshua [place] MRN: [id number] Date of Birth: 01/18/1941   Medicare Important Message Given:  Yes     Kerin Salen 11/27/2018, 10:52 AM

## 2018-11-27 NOTE — Progress Notes (Signed)
   11/27/18 1125  Hydrotherapy treatment note  Subjective Assessment  Subjective It hurts back there  Patient and Family Stated Goals pt. unable to state  Date of Onset  (present on admission)  Prior Treatments dressing changes at SNF  Evaluation and Treatment  Evaluation and Treatment Procedures Explained to Patient/Family Yes  Evaluation and Treatment Procedures Patient unable to consent due to mental status  Pressure Injury 11/25/18 Sacrum Medial Stage IV - Full thickness tissue loss with exposed bone, tendon or muscle. greenish color to drainage, open wound, palpable sacrum( Physical Therapy)  Date First Assessed/Time First Assessed: 11/25/18 1030   Location: Sacrum  Location Orientation: Medial  Staging: Stage IV - Full thickness tissue loss with exposed bone, tendon or muscle.  Wound Description (Comments): greenish color to drainage, ope...  Dressing Type Gauze (Comment);Moist to moist (4/4's Raoul Pitch)  Dressing Changed  Dressing Change Frequency Twice a day  State of Healing Non-healing  Site / Wound Assessment Friable;Painful;Pink;Yellow  % Wound base Red or Granulating 5%  % Wound base Yellow/Fibrinous Exudate 95%  Peri-wound Assessment Erythema (blanchable);Excoriated;Maceration;Purple  Wound Length (cm) 6 cm  Wound Width (cm) 3 cm  Wound Depth (cm) 2 cm  Wound Surface Area (cm^2) 18 cm^2  Wound Volume (cm^3) 36 cm^3  Undermining (cm) 1  Margins Unattached edges (unapproximated)  Drainage Amount Moderate  Drainage Description No odor;Serosanguineous (bloody, clots on old dr.)  Hydrotherapy  Pulsed lavage therapy - wound location sacral  Pulsed Lavage with Suction (psi) 8 psi  Pulsed Lavage with Suction - Normal Saline Used 1000 mL  Pulsed Lavage Tip Tip with splash shield  Selective Debridement  Selective Debridement - Location left side of wound  Selective Debridement - Tools Used Forceps;Scissors  Selective Debridement - Tissue Removed yellow tough tissue   Wound Therapy - Assess/Plan/Recommendations  Wound Therapy - Clinical Statement patient moaning more and indicates discomfort, appears when shield touches edges. wound ciontinues to have  mostly slough and fibrinous ts. continue PLS 3 x a week. Reassess next visit. Per WOC, patient will NOT require Hydrotherapy when DC'd to SNF, dressing changes with Santyl will be continued. RN notified for pain medication if available. Will premedicate next visit.  Wound Therapy - Functional Problem List contracures of limbs, requires total assistance  Factors Delaying/Impairing Wound Healing Immobility;Infection - systemic/local  Hydrotherapy Plan Debridement;Dressing change;Patient/family education;Pulsatile lavage with suction  Wound Therapy - Frequency 3X / week  Wound Therapy - Follow Up Recommendations Skilled nursing facility (no hydrotherapy will be indicated in SNF)  Wound Plan hydrotherapy and wound care  Wound Therapy Goals - Improve the function of patient's integumentary system by progressing the wound(s) through the phases of wound healing by:  Decrease Necrotic Tissue to 50  Decrease Necrotic Tissue - Progress Progressing toward goal  Increase Granulation Tissue to 50  Increase Granulation Tissue - Progress Progressing toward goal  Improve Drainage Characteristics Min  Improve Drainage Characteristics - Progress Progressing toward goal  Time For Goal Achievement 2 weeks  Wound Therapy - Potential for Goals Poor (osteomyelitis)  [place] Pager [phone] Office [phone] Deb<20, 1 dress, kit

## 2018-11-28 LAB — CBC
HCT: 35.1 % — ABNORMAL LOW (ref 39.0–52.0)
Hemoglobin: 11 g/dL — ABNORMAL LOW (ref 13.0–17.0)
MCH: 31.1 pg (ref 26.0–34.0)
MCHC: 31.3 g/dL (ref 30.0–36.0)
MCV: 99.2 fL (ref 80.0–100.0)
Platelets: 309 10*3/uL (ref 150–400)
RBC: 3.54 MIL/uL — ABNORMAL LOW (ref 4.22–5.81)
RDW: 15.2 % (ref 11.5–15.5)
WBC: 6.2 10*3/uL (ref 4.0–10.5)
nRBC: 0 % (ref 0.0–0.2)

## 2018-11-28 LAB — CULTURE, BLOOD (ROUTINE X 2)
Culture: NO GROWTH
Special Requests: ADEQUATE

## 2018-11-28 LAB — GLUCOSE, CAPILLARY
Glucose-Capillary: 105 mg/dL — ABNORMAL HIGH (ref 70–99)
Glucose-Capillary: 138 mg/dL — ABNORMAL HIGH (ref 70–99)

## 2018-11-28 MED ORDER — COLLAGENASE 250 UNIT/GM EX OINT: TOPICAL_OINTMENT | Freq: Every day | CUTANEOUS | 0 refills | Status: DC

## 2018-11-28 MED ORDER — SENNA 8.6 MG PO TABS: 1.0000 | ORAL_TABLET | Freq: Two times a day (BID) | ORAL | 0 refills | Status: AC

## 2018-11-28 MED ORDER — POLYETHYLENE GLYCOL 3350 17 G PO PACK: 17.0000 g | PACK | Freq: Two times a day (BID) | ORAL | 0 refills | Status: AC

## 2018-11-28 MED ORDER — VANCOMYCIN IV (FOR PTA / DISCHARGE USE ONLY): 750.0000 mg | Freq: Two times a day (BID) | INTRAVENOUS | 0 refills | Status: DC

## 2018-11-28 MED ORDER — CEFTRIAXONE IV (FOR PTA / DISCHARGE USE ONLY): 2.0000 g | INTRAVENOUS | 0 refills | Status: DC

## 2018-11-28 MED ORDER — ACETAMINOPHEN 325 MG PO TABS: 650.0000 mg | ORAL_TABLET | Freq: Four times a day (QID) | ORAL | Status: AC | PRN

## 2018-11-28 MED ORDER — METRONIDAZOLE 50 MG/ML ORAL SUSPENSION: 500.0000 mg | Freq: Three times a day (TID) | ORAL | 1 refills | Status: DC

## 2018-11-28 MED ORDER — METOPROLOL TARTRATE 25 MG PO TABS: 12.5000 mg | ORAL_TABLET | Freq: Two times a day (BID) | ORAL | Status: AC

## 2018-11-28 MED ORDER — TRAMADOL HCL 50 MG PO TABS: 50.0000 mg | ORAL_TABLET | Freq: Four times a day (QID) | ORAL | 0 refills | Status: DC | PRN

## 2018-11-28 NOTE — [place] FL2 (Signed)
[place] MEDICAID FL2 LEVEL OF CARE SCREENING TOOL     IDENTIFICATION  Patient Name: Joshua PartridgeJohn H Gonzales Birthdate: 10-Nov-1940 Sex: male Admission Date (Current Location): 11/23/2018  [facility] [place]   Facility and Address:  [facility],  [address], [place] [id number]      Provider Number: [id number]  Attending Physician Name and Address:  Cathren Harshai, Ripudeep K, MD  Relative Name and Phone Number:       Current Level of Care: Hospital Recommended Level of Care: Skilled Nursing Facility Prior Approval Number:    Date Approved/Denied:   PASRR Number:    Discharge Plan: SNF    Current Diagnoses: Patient Active Problem List   Diagnosis Date Noted  . Osteomyelitis of vertebra, sacral and sacrococcygeal region (HCC)   . Pressure injury of skin 11/24/2018  . Acute metabolic encephalopathy 11/23/2018  . Cellulitis 11/23/2018  . Osteomyelitis (HCC) 11/23/2018  . Constipation 11/23/2018  . Goals of care, counseling/discussion   . Palliative care by specialist   . FTT (failure to thrive) in adult   . Type 2 diabetes mellitus without complication (HCC) 08/16/2018  . History of CVA with residual deficit 08/16/2018  . Hyperammonemia (HCC) 08/16/2018  . Bradycardia 08/16/2018  . Lactic acid increased   . Hypotension 06/03/2014  . Near syncope 06/03/2014  . AKI (acute kidney injury) (HCC) 06/03/2014  . Partial anterior cerebral circulation infarction (HCC)   . Dementia with behavioral disturbance (HCC)   . PAF (paroxysmal atrial fibrillation) (HCC)   . History of seizures 05/15/2014  . Essential hypertension, benign 01/24/2013  . Unspecified late effects of cerebrovascular disease 01/24/2013  . Acute encephalopathy 11/26/2012  . Hypokalemia 11/26/2012  . Alcoholism in remission (HCC) 11/26/2012    Orientation RESPIRATION BLADDER Height & Weight     Self, Time, Situation  Normal Incontinent Weight: 78.7 kg Height:     BEHAVIORAL  SYMPTOMS/MOOD NEUROLOGICAL BOWEL NUTRITION STATUS      Incontinent Diet(Regular)  AMBULATORY STATUS COMMUNICATION OF NEEDS Skin   Total Care Verbally Other (Comment)(Sacral Stage 4, unstage bilateral heels)                       Personal Care Assistance Level of Assistance  Bathing, Feeding, Dressing, Total care Bathing Assistance: Maximum assistance Feeding assistance: Maximum assistance Dressing Assistance: Maximum assistance Total Care Assistance: Maximum assistance   Functional Limitations Info  Sight, Hearing Sight Info: Adequate Hearing Info: Adequate      SPECIAL CARE FACTORS FREQUENCY                       Contractures Contractures Info: Present(Hands)    Additional Factors Info  Code Status, Allergies Code Status Info: DNR Allergies Info: No Known Allergies           Current Medications (11/28/2018):  This is the current hospital active medication list Current Facility-Administered Medications  Medication Dose Route Frequency Provider Last Rate Last Dose  . 0.9 %  sodium chloride infusion  1,000 mL Intravenous Continuous Rodolph Bonghompson, Daniel V, MD 75 mL/hr at 11/28/18 0600 1,000 mL at 11/28/18 0600  . acetaminophen (TYLENOL) tablet 650 mg  650 mg Oral Q6H PRN Rodolph Bonghompson, Daniel V, MD   650 mg at 11/24/18 [id number]   Or  . acetaminophen (TYLENOL) suppository 650 mg  650 mg Rectal Q6H PRN Rodolph Bonghompson, Daniel V, MD      . albuterol (PROVENTIL) (2.5 MG/3ML) 0.083% nebulizer solution 2.5 mg  2.5 mg Nebulization Q2H PRN Rodolph Bong, MD      . aspirin chewable tablet 81 mg  81 mg Oral Daily Rodolph Bong, MD   81 mg at 11/28/18 0174  . cefTRIAXone (ROCEPHIN) 2 g in sodium chloride 0.9 % 100 mL IVPB  2 g Intravenous Q24H Cliffton Asters, MD 200 mL/hr at 11/28/18 0936 2 g at 11/28/18 0936  . Chlorhexidine Gluconate Cloth 2 % PADS 6 each  6 each Topical Daily Rodolph Bong, MD   6 each at 11/27/18 1138  . collagenase (SANTYL) ointment   Topical Daily Rodolph Bong, MD      . donepezil (ARICEPT) tablet 10 mg  10 mg Oral QHS Rodolph Bong, MD   10 mg at 11/28/18 0049  . enoxaparin (LOVENOX) injection 40 mg  40 mg Subcutaneous Q24H Rodolph Bong, MD   40 mg at 11/27/18 1710  . feeding supplement (ENSURE ENLIVE) (ENSURE ENLIVE) liquid 237 mL  237 mL Oral TID BM Rodolph Bong, MD   237 mL at 11/28/18 0932  . feeding supplement (PRO-STAT SUGAR FREE 64) liquid 30 mL  30 mL Oral BID BM Rodolph Bong, MD   30 mL at 11/27/18 1710  . hydrALAZINE (APRESOLINE) injection 10 mg  10 mg Intravenous Q6H PRN Rodolph Bong, MD      . insulin aspart (novoLOG) injection 0-9 Units  0-9 Units Subcutaneous TID WC Rodolph Bong, MD   1 Units at 11/26/18 1206  . lactulose (CHRONULAC) 10 GM/15ML solution 20 g  20 g Oral Daily PRN Rodolph Bong, MD      . levETIRAcetam (KEPPRA) 100 MG/ML solution 750 mg  750 mg Oral BID Rodolph Bong, MD   750 mg at 11/28/18 0929  . losartan (COZAAR) tablet 50 mg  50 mg Oral Daily Rodolph Bong, MD   50 mg at 11/28/18 9449  . metoprolol tartrate (LOPRESSOR) tablet 12.5 mg  12.5 mg Oral BID Rodolph Bong, MD   12.5 mg at 11/28/18 6759  . metroNIDAZOLE (FLAGYL) 50 mg/ml oral suspension 500 mg  500 mg Per Tube TID Cliffton Asters, MD   500 mg at 11/28/18 0930  . multivitamin with minerals tablet 1 tablet  1 tablet Oral Daily Rodolph Bong, MD   1 tablet at 11/28/18 [phone]  . ondansetron (ZOFRAN) tablet 4 mg  4 mg Oral Q6H PRN Rodolph Bong, MD       Or  . ondansetron [facility]) injection 4 mg  4 mg Intravenous Q6H PRN Rodolph Bong, MD      . polyethylene glycol (MIRALAX / GLYCOLAX) packet 17 g  17 g Oral BID Rodolph Bong, MD   17 g at 11/28/18 0930  . senna (SENOKOT) tablet 8.6 mg  1 tablet Oral BID Rodolph Bong, MD   8.6 mg at 11/28/18 4665  . sodium chloride flush (NS) 0.9 % injection 10-40 mL  10-40 mL Intracatheter PRN Rodolph Bong, MD      . sodium chloride flush  (NS) 0.9 % injection 10-40 mL  10-40 mL Intracatheter Q12H Rodolph Bong, MD   10 mL at 11/28/18 0053  . sodium chloride flush (NS) 0.9 % injection 10-40 mL  10-40 mL Intracatheter PRN Rodolph Bong, MD      . sodium phosphate (FLEET) 7-19 GM/118ML enema 1 enema  1 enema Rectal Once PRN Rodolph Bong, MD      .  sorbitol 70 % solution 30 mL  30 mL Oral Daily PRN Eugenie Filler, MD      . traMADol Veatrice Bourbon) tablet 50 mg  50 mg Oral Q6H PRN Eugenie Filler, MD      . valproic acid (DEPAKENE) solution 125 mg  125 mg Per Tube BID Eugenie Filler, MD   125 mg at 11/28/18 0931  . vancomycin (VANCOCIN) IVPB 750 mg/150 ml premix  750 mg Intravenous Q12H Luiz Ochoa, RPH 150 mL/hr at 11/28/18 0654 750 mg at 11/28/18 7078     Discharge Medications: Please see discharge summary for a list of discharge medications.  Relevant Imaging Results:  Relevant Lab Results:   Additional Information [id number]  Purcell Mouton, RN

## 2018-11-28 NOTE — Discharge Summary (Signed)
Physician Discharge Summary   Patient ID: SHERIFF RODENBERG MRN: [id number] DOB/AGE: 03-12-1940 78 y.o.  Admit date: 11/23/2018 Discharge date: 11/28/2018  Primary Care Physician:  Seward Carol, MD   Recommendations for Outpatient Follow-up:  1. Follow up with PCP in 1-2 weeks 2. ID recommended discharge on IV vancomycin, IV Rocephin and metronidazole per G-tube for total duration of 6 weeks with end date 01/07/2019.  Weekly labs CBC, BMP, CRP, ESR, Vanco trough goal 15-20 Fax weekly labs to [phone] Follow-up ID appointment 01/07/2019   Home Health: Patient returning to skilled nursing facility Equipment/Devices:   Discharge Condition: stable  CODE STATUS: DNR   Diet recommendation: Soft diet   Discharge Diagnoses:    . Acute metabolic encephalopathy, close to baseline Dehydration . Stage IV sacral decub/sacral osteomyelitis  . PAF (paroxysmal atrial fibrillation) ([place]) . Osteomyelitis ([place]) . Essential hypertension, benign . Dementia with behavioral disturbance ([place]) . FTT (failure to thrive) in adult . Constipation History of neurosyphilis History of seizures History of CVA  Consults: Infectious disease    Allergies:  No Known Allergies   DISCHARGE MEDICATIONS: Allergies as of 11/28/2018   No Known Allergies     Medication List    STOP taking these medications   HYDROcodone-acetaminophen 5-325 MG tablet Commonly known as: NORCO/VICODIN   losartan 50 MG tablet Commonly known as: COZAAR   vancomycin 1 g injection Commonly known as: VANCOCIN Replaced by: vancomycin  IVPB   vancomycin 1,250 mg in sodium chloride 0.9 % 250 mL     TAKE these medications   acetaminophen 325 MG tablet Commonly known as: TYLENOL Take 2 tablets (650 mg total) by mouth every 6 (six) hours as needed for mild pain (or Fever >/= 101).   aspirin 81 MG chewable tablet Chew 81 mg by mouth daily.   cefTRIAXone  IVPB Commonly known as: ROCEPHIN Inject 2 g into the vein  daily. Indication:  Osteomyelitis of sacrum Last Day of Therapy:  01/07/2019 Labs - Once weekly:  CBC/D and BMP, Labs - Every other week:  ESR and CRP   CERTA-VITE PO Take 1 tablet by mouth daily.   collagenase ointment Commonly known as: SANTYL Apply topically daily. Apply to sacral wound daily   donepezil 10 MG tablet Commonly known as: ARICEPT Take 10 mg by mouth at bedtime.   feeding supplement (PRO-STAT SUGAR FREE 64) Liqd Take 30 mLs by mouth 2 (two) times a day.   ipratropium-albuterol 0.5-2.5 (3) MG/3ML Soln Commonly known as: DUONEB Take 3 mLs by nebulization every 6 (six) hours as needed (for shortness of breath or wheezing).   lactulose 10 GM/15ML solution Commonly known as: CHRONULAC Take 20 g by mouth daily as needed for moderate constipation.   levETIRAcetam 100 MG/ML solution Commonly known as: KEPPRA Take 750 mg by mouth 2 (two) times daily.   metoprolol tartrate 25 MG tablet Commonly known as: LOPRESSOR Take 0.5 tablets (12.5 mg total) by mouth 2 (two) times daily.   metroNIDAZOLE 50 mg/ml oral suspension Commonly known as: FLAGYL Place 10 mLs (500 mg total) into feeding tube 3 (three) times daily. Give via tube until 01/07/2019   polyethylene glycol 17 g packet Commonly known as: MIRALAX / GLYCOLAX Take 17 g by mouth 2 (two) times daily.   senna 8.6 MG Tabs tablet Commonly known as: SENOKOT Take 1 tablet (8.6 mg total) by mouth 2 (two) times daily.   traMADol 50 MG tablet Commonly known as: ULTRAM Take 1 tablet (50 mg total) by mouth  every 6 (six) hours as needed for moderate pain.   valproic acid 250 MG/5ML Soln solution Commonly known as: DEPAKENE Place 125 mg into feeding tube 2 (two) times daily.   vancomycin  IVPB Inject 750 mg into the vein every 12 (twelve) hours. Indication: osteomyelitis of sacrum Last Day of Therapy:  01/07/2019 Labs - Sunday/Monday:  CBC/D, BMP, and vancomycin trough. Labs - Thursday:  BMP and vancomycin  trough Labs - Every other week:  ESR and CRP Replaces: vancomycin 1 g injection            Home Infusion Instuctions  (From admission, onward)         Start     Ordered   11/28/18 0000  Home infusion instructions    Question:  Instructions  Answer:  Flushing of vascular access device: 0.9% NaCl pre/post medication administration and prn patency; Heparin 100 u/ml, 59m for implanted ports and Heparin 10u/ml, 533mfor all other central venous catheters.   11/28/18 0842           Brief H and P: For complete details please refer to admission H and P, but in briefPatient 78 [facility] history of Alzheimer's dementia, hypertension, CVA with visual deficits, history of neurosyphilis that was treated, seizure disorder, ongoing treatment with IV vancomycin for sacral osteomyelitis [facility].  Patient was admitted.  Patient pancultured.  Patient placed empirically on IV antibiotics.  Site of right PICC line with some erythema and PICC line removed per recommendations from ID.     Hospital Course:   Acute metabolic encephalopathy in the setting of severe dementia -Currently at baseline, alert awake and able to voice his name.  Tolerating p.o. -Patient has a history of Alzheimer's dementia, was initially drowsy and not answering questions on admission.  Now improving clinically.  Likely worsened due to infectious cause, sacral osteomyelitis. -Blood cultures from ED showed MRSA coagulase-negative staph, possibly contaminant.   - Blood cultures 9/19 no growth to date. -Urine culture 9/19 showed no growth. -Per ID, patient may have had a transient bacteremia related to PICC line, which was removed. -CT head was negative for any acute abnormalities. -Continue current regimen of antibiotics as below.  Stage IV sacral decub ulcer/sacral osteomyelitis, present on  admission -Patient was noted to be on IV vancomycin at the facility to be treated for 4 weeks for sacral wound/osteomyelitis.  Around the sacral wound with slight erythema - blood cultures grew MRSA coagulase-negative staph which ID felt is insignificant contaminant. -Continue IV vancomycin, IV ceftriaxone, metronidazole per G-tube for total of 6 weeks with end date 01/07/2019.  Follow-up ID appointment on 01/07/2019 Weekly labs CBC, BMP, CRP, ESR, Vanco trough goal 15-20 -PICC line placed on 9/22, cleared by ID for discharge to skilled nursing facility   History of seizures Remained stable, no seizures during hospitalization, continue home regimen of Keppra and Depakote   Dehydration Patient was placed on IV fluid hydration  Paroxysmal atrial fibrillation -Heart rate improved with hydration, stable, rate controlled Continue aspirin, high fall risk, severe dementia hence not on anticoagulation  History of neurosyphilis Finished treatment in 2014  Failure to thrive -Patient long-term SNF resident, baseline bedbound, needing total care, severe dementia Recommend palliative care follow-up at the facility  Dementia Continue Aricept  History of CVA Continue aspirin for secondary stroke prophylaxis   Constipation Continue regimen of MiraLAX twice daily, Senokot as twice daily,  patient needed enema during hospitalization.    Well-controlled diabetes mellitus type 2 Hemoglobin A1c 5.8  Pressure wounds, POA Stage IV sacral decub, deep tissue injury right lateral heel, left medial heel    Day of Discharge S: No issues overnight, no fevers or chills, denies any pain.  Limited ROS due to severe dementia  BP (!) 136/91 (BP Location: Right Arm)   Pulse 86   Temp 98.3 F (36.8 C)   Resp 18   Wt 78.7 kg   SpO2 (!) 89%   BMI 24.18 kg/m   Physical Exam: General: Alert and awake oriented to self, NAD HEENT: anicteric sclera, pupils reactive to light and accommodation CVS:  S1-S2 clear no murmur rubs or gallops Chest: clear to auscultation bilaterally, no wheezing rales or rhonchi Abdomen: soft nontender, nondistended, normal bowel sounds Extremities: no cyanosis, clubbing or edema noted bilaterally Neuro: No new FND's Skin :stage IV sacral decub, deep tissue injury right lateral heel, left medial heel   The results of significant diagnostics from this hospitalization (including imaging, microbiology, ancillary and laboratory) are listed below for reference.      Procedures/Studies:  Ct Head Wo Contrast  Result Date: 11/23/2018 CLINICAL DATA:  Altered mental status, encephalopathy. EXAM: CT HEAD WITHOUT CONTRAST TECHNIQUE: Contiguous axial images were obtained from the base of the skull through the vertex without intravenous contrast. COMPARISON:  Head CT dated 08/16/2018. FINDINGS: Brain: Generalized parenchymal volume loss with commensurate dilatation of the ventricles and sulci. 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 Also again noted are chronic small vessel ischemic changes within the bilateral basal ganglia regions. There is no mass, hemorrhage, edema or other evidence of acute parenchymal abnormality. No extra-axial hemorrhage. Vascular: Chronic calcified atherosclerotic changes of the large vessels at the skull base. No unexpected hyperdense vessel. Skull: Normal. Negative for fracture or focal lesion. Sinuses/Orbits: No acute finding. Other: None. IMPRESSION: 1. No acute findings. No intracranial mass, hemorrhage or edema. 2. Chronic ischemic changes, as detailed above. Electronically Signed   By: Franki Cabot M.D.   On: 11/23/2018 12:58   Ct Abdomen Pelvis W Contrast  Result Date: 11/23/2018 CLINICAL DATA:  Abdominal distension. Altered mental status. History of sacral wounds. EXAM: CT  ABDOMEN AND PELVIS WITH CONTRAST TECHNIQUE: Multidetector CT imaging of the abdomen and pelvis was performed using the standard protocol following bolus administration of intravenous contrast. CONTRAST:  158m OMNIPAQUE IOHEXOL 300 MG/ML  SOLN COMPARISON:  MRI sacrum dated 11/05/2018. CT abdomen dated 09/17/2018 FINDINGS: Lower chest: Advanced chronic bibasilar interstitial fibrosis with associated bullous change and honeycombing. Hepatobiliary: No focal liver abnormality is seen. No gallstones, gallbladder wall thickening, or biliary dilatation. Pancreas: Unremarkable. No pancreatic ductal dilatation or surrounding inflammatory changes. Spleen: Normal in size without focal abnormality. Adrenals/Urinary Tract: Adrenal glands appear normal. Hypodense foci within both kidneys, too small to definitively characterize but most likely cysts. Additional scarring within the LEFT renal cortex. No suspicious mass, stone or hydronephrosis identified. No ureteral or bladder calculi identified. Bladder is unremarkable. Stomach/Bowel: No dilated large or small bowel loops. Fairly large amount of stool throughout the nondistended colon. No evidence of bowel wall inflammation seen. Appendix is normal. Gastrostomy tube appears appropriately positioned. Vascular/Lymphatic: Aortic atherosclerosis. 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 No acute appearing vascular abnormality. No enlarged lymph nodes seen  in the abdomen or pelvis. Reproductive: Prostate is unremarkable. Other: Sacral decubitus ulcer appear stable compared to the recent MRI, with presumed packing material. Ill-defined fluid stranding/edema within the presacral space is likely related and is also stable compared to the earlier MRI. Underlying osteomyelitis of the sacrum better demonstrated on the earlier MRI. Musculoskeletal: No other acute or suspicious osseous findings. Degenerative spondylosis  throughout the slightly scoliotic thoracolumbar spine, mild to moderate in degree. Ill-defined edema within the superficial subcutaneous soft tissues indicating mild anasarca. IMPRESSION: 1. Fairly large amount of stool throughout the nondistended colon (constipation? ). No evidence of bowel obstruction or evidence of bowel wall inflammation. No free fluid or abscess collection. No free intraperitoneal air. 2. Sacral decubitus ulcer appear stable compared to the recent MRI, with presumed packing material. Ill-defined fluid stranding/edema within the presacral space is also stable compared to the earlier MRI. Underlying osteomyelitis of the sacrum better demonstrated on the earlier MRI. 3. Aortic atherosclerosis. 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 4. Advanced chronic bibasilar interstitial fibrosis with associated bullous change and honeycombing. 5. Additional chronic/incidental findings detailed Aortic Atherosclerosis (ICD10-I70.0). Electronically Signed   By: Franki Cabot M.D.   On: 11/23/2018 09:30   Mr Sacrum Si Joints Wo Contrast  Result Date: 11/05/2018 CLINICAL DATA:  Decubitus ulcer. EXAM: MRI SACRUM WITHOUT CONTRAST TECHNIQUE: Multiplanar, multisequence MR imaging of the sacrum was performed. No intravenous contrast was administered as the patient could not tolerate further imaging due to pain. COMPARISON:  MRI sacrum dated October 03, 2016. FINDINGS: Bones/Joint/Cartilage Abnormal marrow edema at the sacrococcygeal junction with corresponding heterogeneously decreased T1 marrow signal. No fracture or dislocation. Normal alignment. No joint effusion. Muscles and Tendons Mild symmetric bilateral gluteal muscle edema. No significant muscle atrophy. Soft tissue Midline decubitus ulcer over the sacrococcygeal junction. Mild presacral edema. Superficial soft tissue edema overlying the lower right paraspinous muscles. No fluid collection or  hematoma. No soft tissue mass. IMPRESSION: 1. Midline decubitus ulcer over the sacrococcygeal junction with underlying osteomyelitis. No abscess. Electronically Signed   By: Titus Dubin M.D.   On: 11/05/2018 15:49   Dg Chest Port 1 View  Addendum Date: 11/23/2018   ADDENDUM REPORT: 11/23/2018 12:40 ADDENDUM: RIGHT-sided PICC line appears well positioned with tip at the level of the mid/lower SVC. Electronically Signed   By: Franki Cabot M.D.   On: 11/23/2018 12:40   Result Date: 11/23/2018 CLINICAL DATA:  Pt [facility] ED [facility]. Pt has increased AMS, possible sepsis. Pt has pressure sores on the sacrum. Pt was on antibiotics for UTI recently. Medical hx of Alzheimer's and HTN. EXAM: PORTABLE CHEST 1 VIEW COMPARISON:  08/16/2018 and older exams. FINDINGS: Cardiac silhouette is normal in size. No mediastinal or hilar masses. Linear areas of scarring noted in the inferior right upper lobe adjacent to the minor fissure and left mid to lower lung. Prominent interstitial markings noted at the lung bases. These findings are stable. No evidence of pneumonia or pulmonary edema. No pleural effusion or pneumothorax. Skeletal structures are demineralized but grossly intact. IMPRESSION: 1. No acute cardiopulmonary disease. Electronically Signed: By: Lajean Manes M.D. On: 11/23/2018 07:28   [place] Ekg Site Rite  Result Date: 11/25/2018 If Site Rite image not attached, placement could not be confirmed due to current cardiac rhythm.      LAB RESULTS: Basic Metabolic Panel: Recent Labs  Lab 11/25/18 0402 11/27/18 0437  NA 139 139  K  3.9 3.2*  CL 106 105  CO2 24 26  GLUCOSE 89 78  BUN 14 9  CREATININE 0.67 0.71  CALCIUM 8.8* 8.9  MG 2.2  --    Liver Function Tests: Recent Labs  Lab 11/23/18 0652 11/24/18 0427  AST 29 23  ALT 24 22  ALKPHOS 75 49  BILITOT 0.3 0.6  PROT 8.1 7.5  ALBUMIN 3.0* 2.9*   No results for input(s): LIPASE, AMYLASE in the last 168 hours. Recent Labs   Lab 11/23/18 0654  AMMONIA 27   CBC: Recent Labs  Lab 11/25/18 0402 11/27/18 0437 11/28/18 0322  WBC 6.0 6.3 6.2  NEUTROABS 3.5  --   --   HGB 10.8* 11.1* 11.0*  HCT 35.2* 36.0* 35.1*  MCV 100.0 100.6* 99.2  PLT 251 281 309   Cardiac Enzymes: No results for input(s): CKTOTAL, CKMB, CKMBINDEX, TROPONINI in the last 168 hours. BNP: Invalid input(s): POCBNP CBG: Recent Labs  Lab 11/27/18 2113 11/28/18 0735  GLUCAP 103* 105*      Disposition and Follow-up: Discharge Instructions    Home infusion instructions   Complete by: As directed    Instructions: Flushing of vascular access device: 0.9% NaCl pre/post medication administration and prn patency; Heparin 100 u/ml, 71m for implanted ports and Heparin 10u/ml, 568mfor all other central venous catheters.   Increase activity slowly   Complete by: As directed        DISPOSITION: Skilled nursing facility   DISCHARGE FOLLOW-UP Follow-up Information    [name], RoJori MollMD. Schedule an appointment as soon as possible for a visit in 2 week(s).   Specialty: Internal Medicine Contact information: [address] [place] [phone]          Time coordinating discharge:  [id number]   Signed:   RiEstill Cotta.D. [facility] 11/28/2018, 10:53 AM

## 2018-11-28 NOTE — TOC Progression Note (Signed)
Transition of Care [facility]) - Progression Note    Patient Details  Name: Joshua Gonzales MRN: [id number] Date of Birth: May 20, 1940  Transition of Care [facility]) CM/SW Contact  Purcell Mouton, RN Phone Number: 11/28/2018, 11:28 AM  Clinical Narrative:    Phoebe Perch and discharge summary faxed to [facility]. Pt COVID negative 9/19. SNF aware.         Expected Discharge Plan and Services           Expected Discharge Date: 11/28/18                                     Social Determinants of Health (SDOH) Interventions    Readmission Risk Interventions No flowsheet data found.

## 2018-11-28 NOTE — TOC Progression Note (Signed)
Transition of Care [facility]) - Progression Note    Patient Details  Name: Joshua Gonzales MRN: [id number] Date of Birth: April 25, 1940  Transition of Care [facility]) CM/SW Contact  Purcell Mouton, RN Phone Number: 11/28/2018, 10:32 AM  Clinical Narrative:    A call at 950 to [facility] was made. After several calls spoke with Deidre Ala, DON concerning pt's return. Pt may return today.         Expected Discharge Plan and Services           Expected Discharge Date: 11/28/18                                     Social Determinants of Health (SDOH) Interventions    Readmission Risk Interventions No flowsheet data found.

## 2018-12-02 ENCOUNTER — Encounter (HOSPITAL_BASED_OUTPATIENT_CLINIC_OR_DEPARTMENT_OTHER): Payer: Medicare Other | Attending: Internal Medicine

## 2018-12-20 ENCOUNTER — Encounter (HOSPITAL_BASED_OUTPATIENT_CLINIC_OR_DEPARTMENT_OTHER): Payer: No Typology Code available for payment source | Attending: Internal Medicine | Admitting: Internal Medicine

## 2018-12-20 ENCOUNTER — Other Ambulatory Visit: Payer: Self-pay

## 2018-12-20 DIAGNOSIS — I10 Essential (primary) hypertension: Secondary | ICD-10-CM | POA: Diagnosis not present

## 2018-12-20 DIAGNOSIS — Z8673 Personal history of transient ischemic attack (TIA), and cerebral infarction without residual deficits: Secondary | ICD-10-CM | POA: Insufficient documentation

## 2018-12-20 DIAGNOSIS — L89612 Pressure ulcer of right heel, stage 2: Secondary | ICD-10-CM | POA: Insufficient documentation

## 2018-12-20 DIAGNOSIS — L89154 Pressure ulcer of sacral region, stage 4: Secondary | ICD-10-CM | POA: Insufficient documentation

## 2018-12-20 DIAGNOSIS — F0390 Unspecified dementia without behavioral disturbance: Secondary | ICD-10-CM | POA: Insufficient documentation

## 2018-12-20 DIAGNOSIS — M8668 Other chronic osteomyelitis, other site: Secondary | ICD-10-CM | POA: Insufficient documentation

## 2018-12-20 DIAGNOSIS — Z931 Gastrostomy status: Secondary | ICD-10-CM | POA: Diagnosis not present

## 2018-12-20 DIAGNOSIS — L89626 Pressure-induced deep tissue damage of left heel: Secondary | ICD-10-CM | POA: Diagnosis not present

## 2019-01-05 DIAGNOSIS — S31000A Unspecified open wound of lower back and pelvis without penetration into retroperitoneum, initial encounter: Secondary | ICD-10-CM

## 2019-01-05 HISTORY — DX: Unspecified open wound of lower back and pelvis without penetration into retroperitoneum, initial encounter: S31.000A

## 2019-01-07 ENCOUNTER — Ambulatory Visit (INDEPENDENT_AMBULATORY_CARE_PROVIDER_SITE_OTHER): Payer: Medicare Other | Admitting: Internal Medicine

## 2019-01-07 ENCOUNTER — Other Ambulatory Visit: Payer: Self-pay

## 2019-01-07 ENCOUNTER — Emergency Department (HOSPITAL_COMMUNITY): Payer: Medicare Other

## 2019-01-07 ENCOUNTER — Telehealth: Payer: Self-pay

## 2019-01-07 ENCOUNTER — Inpatient Hospital Stay (HOSPITAL_COMMUNITY)
Admission: EM | Admit: 2019-01-07 | Discharge: 2019-01-14 | DRG: 593 | Disposition: A | Discharge status: Skilled Nursing Facility | Payer: Medicare Other | Attending: Internal Medicine | Admitting: Internal Medicine

## 2019-01-07 ENCOUNTER — Encounter (HOSPITAL_COMMUNITY): Payer: Self-pay

## 2019-01-07 VITALS — BP 158/91 | HR 97

## 2019-01-07 DIAGNOSIS — R509 Fever, unspecified: Secondary | ICD-10-CM | POA: Diagnosis present

## 2019-01-07 DIAGNOSIS — M869 Osteomyelitis, unspecified: Secondary | ICD-10-CM | POA: Diagnosis not present

## 2019-01-07 DIAGNOSIS — R627 Adult failure to thrive: Secondary | ICD-10-CM | POA: Diagnosis present

## 2019-01-07 DIAGNOSIS — G309 Alzheimer's disease, unspecified: Secondary | ICD-10-CM | POA: Diagnosis present

## 2019-01-07 DIAGNOSIS — E440 Moderate protein-calorie malnutrition: Secondary | ICD-10-CM | POA: Diagnosis present

## 2019-01-07 DIAGNOSIS — Z20828 Contact with and (suspected) exposure to other viral communicable diseases: Secondary | ICD-10-CM | POA: Diagnosis present

## 2019-01-07 DIAGNOSIS — Z23 Encounter for immunization: Secondary | ICD-10-CM

## 2019-01-07 DIAGNOSIS — L89109 Pressure ulcer of unspecified part of back, unspecified stage: Secondary | ICD-10-CM | POA: Diagnosis not present

## 2019-01-07 DIAGNOSIS — I6990 Unspecified sequelae of unspecified cerebrovascular disease: Secondary | ICD-10-CM | POA: Diagnosis not present

## 2019-01-07 DIAGNOSIS — R569 Unspecified convulsions: Secondary | ICD-10-CM | POA: Diagnosis present

## 2019-01-07 DIAGNOSIS — Z515 Encounter for palliative care: Secondary | ICD-10-CM | POA: Diagnosis present

## 2019-01-07 DIAGNOSIS — F0280 Dementia in other diseases classified elsewhere without behavioral disturbance: Secondary | ICD-10-CM | POA: Diagnosis present

## 2019-01-07 DIAGNOSIS — Z95828 Presence of other vascular implants and grafts: Secondary | ICD-10-CM | POA: Diagnosis not present

## 2019-01-07 DIAGNOSIS — Z931 Gastrostomy status: Secondary | ICD-10-CM

## 2019-01-07 DIAGNOSIS — Z87891 Personal history of nicotine dependence: Secondary | ICD-10-CM | POA: Diagnosis not present

## 2019-01-07 DIAGNOSIS — L89159 Pressure ulcer of sacral region, unspecified stage: Secondary | ICD-10-CM | POA: Diagnosis not present

## 2019-01-07 DIAGNOSIS — E119 Type 2 diabetes mellitus without complications: Secondary | ICD-10-CM | POA: Diagnosis not present

## 2019-01-07 DIAGNOSIS — Z6821 Body mass index (BMI) 21.0-21.9, adult: Secondary | ICD-10-CM | POA: Diagnosis not present

## 2019-01-07 DIAGNOSIS — S31000A Unspecified open wound of lower back and pelvis without penetration into retroperitoneum, initial encounter: Secondary | ICD-10-CM | POA: Diagnosis present

## 2019-01-07 DIAGNOSIS — S31000D Unspecified open wound of lower back and pelvis without penetration into retroperitoneum, subsequent encounter: Secondary | ICD-10-CM

## 2019-01-07 DIAGNOSIS — M4628 Osteomyelitis of vertebra, sacral and sacrococcygeal region: Secondary | ICD-10-CM | POA: Diagnosis present

## 2019-01-07 DIAGNOSIS — L89154 Pressure ulcer of sacral region, stage 4: Secondary | ICD-10-CM | POA: Diagnosis present

## 2019-01-07 DIAGNOSIS — E1169 Type 2 diabetes mellitus with other specified complication: Secondary | ICD-10-CM | POA: Diagnosis present

## 2019-01-07 DIAGNOSIS — Z79899 Other long term (current) drug therapy: Secondary | ICD-10-CM

## 2019-01-07 DIAGNOSIS — M6249 Contracture of muscle, multiple sites: Secondary | ICD-10-CM | POA: Diagnosis not present

## 2019-01-07 DIAGNOSIS — Z7982 Long term (current) use of aspirin: Secondary | ICD-10-CM

## 2019-01-07 DIAGNOSIS — Z66 Do not resuscitate: Secondary | ICD-10-CM | POA: Diagnosis present

## 2019-01-07 DIAGNOSIS — L89101 Pressure ulcer of unspecified part of back, stage 1: Secondary | ICD-10-CM | POA: Diagnosis not present

## 2019-01-07 DIAGNOSIS — Z7401 Bed confinement status: Secondary | ICD-10-CM

## 2019-01-07 DIAGNOSIS — I480 Paroxysmal atrial fibrillation: Secondary | ICD-10-CM | POA: Diagnosis present

## 2019-01-07 DIAGNOSIS — I10 Essential (primary) hypertension: Secondary | ICD-10-CM | POA: Diagnosis present

## 2019-01-07 DIAGNOSIS — Z8249 Family history of ischemic heart disease and other diseases of the circulatory system: Secondary | ICD-10-CM

## 2019-01-07 DIAGNOSIS — R6510 Systemic inflammatory response syndrome (SIRS) of non-infectious origin without acute organ dysfunction: Secondary | ICD-10-CM

## 2019-01-07 DIAGNOSIS — E46 Unspecified protein-calorie malnutrition: Secondary | ICD-10-CM | POA: Diagnosis not present

## 2019-01-07 LAB — COMPREHENSIVE METABOLIC PANEL
ALT: 15 U/L (ref 0–44)
AST: 27 U/L (ref 15–41)
Albumin: 2.8 g/dL — ABNORMAL LOW (ref 3.5–5.0)
Alkaline Phosphatase: 75 U/L (ref 38–126)
Anion gap: 10 (ref 5–15)
BUN: 15 mg/dL (ref 8–23)
CO2: 27 mmol/L (ref 22–32)
Calcium: 9.2 mg/dL (ref 8.9–10.3)
Chloride: 103 mmol/L (ref 98–111)
Creatinine, Ser: 0.7 mg/dL (ref 0.61–1.24)
GFR calc Af Amer: >60 mL/min (ref >60)
GFR calc non Af Amer: >60 mL/min (ref >60)
Glucose, Bld: 108 mg/dL — ABNORMAL HIGH (ref 70–99)
Potassium: 4.3 mmol/L (ref 3.5–5.1)
Sodium: 140 mmol/L (ref 135–145)
Total Bilirubin: 0.5 mg/dL (ref 0.3–1.2)
Total Protein: 8.2 g/dL — ABNORMAL HIGH (ref 6.5–8.1)

## 2019-01-07 LAB — CBC WITH DIFFERENTIAL/PLATELET
Abs Immature Granulocytes: 0.02 10*3/uL (ref 0.00–0.07)
Basophils Absolute: 0 10*3/uL (ref 0.0–0.1)
Basophils Relative: 1 %
Eosinophils Absolute: 0.4 10*3/uL (ref 0.0–0.5)
Eosinophils Relative: 6 %
HCT: 45 % (ref 39.0–52.0)
Hemoglobin: 13.7 g/dL (ref 13.0–17.0)
Immature Granulocytes: 0 %
Lymphocytes Relative: 28 %
Lymphs Abs: 1.8 10*3/uL (ref 0.7–4.0)
MCH: 30 pg (ref 26.0–34.0)
MCHC: 30.4 g/dL (ref 30.0–36.0)
MCV: 98.5 fL (ref 80.0–100.0)
Monocytes Absolute: 0.5 10*3/uL (ref 0.1–1.0)
Monocytes Relative: 9 %
Neutro Abs: 3.5 10*3/uL (ref 1.7–7.7)
Neutrophils Relative %: 56 %
Platelets: 250 10*3/uL (ref 150–400)
RBC: 4.57 MIL/uL (ref 4.22–5.81)
RDW: 16.1 % — ABNORMAL HIGH (ref 11.5–15.5)
WBC: 6.2 10*3/uL (ref 4.0–10.5)
nRBC: 0 % (ref 0.0–0.2)

## 2019-01-07 LAB — URINALYSIS, ROUTINE W REFLEX MICROSCOPIC
Bilirubin Urine: NEGATIVE
Glucose, UA: NEGATIVE mg/dL
Hgb urine dipstick: NEGATIVE
Ketones, ur: NEGATIVE mg/dL
Nitrite: NEGATIVE
Protein, ur: 30 mg/dL — AB
Specific Gravity, Urine: 1.02 (ref 1.005–1.030)
pH: 6 (ref 5.0–8.0)

## 2019-01-07 LAB — LACTIC ACID, PLASMA: Lactic Acid, Venous: 1.7 mmol/L (ref 0.5–1.9)

## 2019-01-07 LAB — SARS CORONAVIRUS 2 (TAT 6-24 HRS): SARS Coronavirus 2: NEGATIVE

## 2019-01-07 MED ORDER — ENOXAPARIN SODIUM 40 MG/0.4ML ~~LOC~~ SOLN
40.0000 mg | SUBCUTANEOUS | Status: DC
  Administered 2019-01-07 – 2019-01-13 (×7): 40 mg via SUBCUTANEOUS
  Filled 2019-01-07 (×7): qty 0.4

## 2019-01-07 MED ORDER — VANCOMYCIN HCL 10 G IV SOLR
1500.0000 mg | Freq: Once | INTRAVENOUS | Status: DC
  Filled 2019-01-07: qty 1500

## 2019-01-07 MED ORDER — ONDANSETRON HCL 4 MG/2ML IJ SOLN: 4.0000 mg | Freq: Four times a day (QID) | INTRAMUSCULAR | Status: DC | PRN

## 2019-01-07 MED ORDER — PIPERACILLIN-TAZOBACTAM 3.375 G IVPB
3.3750 g | Freq: Three times a day (TID) | INTRAVENOUS | Status: DC
  Administered 2019-01-08 – 2019-01-09 (×4): 3.375 g via INTRAVENOUS
  Filled 2019-01-07 (×5): qty 50

## 2019-01-07 MED ORDER — MORPHINE SULFATE (PF) 2 MG/ML IV SOLN: 2.0000 mg | INTRAVENOUS | Status: DC | PRN

## 2019-01-07 MED ORDER — SODIUM CHLORIDE 0.9 % IV BOLUS
1000.0000 mL | Freq: Once | INTRAVENOUS | Status: AC
  Administered 2019-01-07: 1000 mL via INTRAVENOUS

## 2019-01-07 MED ORDER — ONDANSETRON HCL 4 MG PO TABS: 4.0000 mg | ORAL_TABLET | Freq: Four times a day (QID) | ORAL | Status: DC | PRN

## 2019-01-07 MED ORDER — VANCOMYCIN HCL IN DEXTROSE 750-5 MG/150ML-% IV SOLN
750.0000 mg | Freq: Two times a day (BID) | INTRAVENOUS | Status: DC
  Administered 2019-01-07 – 2019-01-10 (×6): 750 mg via INTRAVENOUS
  Filled 2019-01-07 (×8): qty 150

## 2019-01-07 MED ORDER — PIPERACILLIN-TAZOBACTAM 3.375 G IVPB 30 MIN
3.3750 g | Freq: Once | INTRAVENOUS | Status: AC
  Administered 2019-01-07: 3.375 g via INTRAVENOUS
  Filled 2019-01-07: qty 50

## 2019-01-07 MED ORDER — SODIUM CHLORIDE 0.9 % IV SOLN
INTRAVENOUS | Status: DC
  Administered 2019-01-07: 23:00:00 via INTRAVENOUS

## 2019-01-07 MED ORDER — SODIUM CHLORIDE 0.9 % IV SOLN
INTRAVENOUS | Status: DC
  Administered 2019-01-08 – 2019-01-14 (×7): via INTRAVENOUS

## 2019-01-07 NOTE — Progress Notes (Signed)
  Patient ID: Joshua Gonzales, male   DOB: 09/20/1940, 78 y.o.   MRN: [id number]  HPI Mr. 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 He is finishing 6 wk of iv vanco, ceftriaxone plus oral metronidazole per peg. He is sent here for completion of abtx and evaluation. He is reportedly having intermittent fevers per SNF report. Here is is tachycardic with 100.3F fever. Appears slightly diaphoretic.  On exam, his picc line is exposed with surrounding erythema which I have pulled. We confirmed that 40cm picc line was same length as inserted. The tip of catheter did have fibrinous debris.   His sacral wound is macerated with tunneling 3/4s of circumference and his back has large nonblanching erythema - that appears to be consistent with tissue injury, where he may not have been rotated as often.  Given concern for SIRS, from picc line or wound. I have arranged to transfer patient to ED for evaluation   Outpatient Encounter Medications as of 01/07/2019  Medication Sig  . risperiDONE (RISPERDAL M-TABS) 0.5 MG disintegrating tablet Take by mouth.  . acetaminophen (TYLENOL) 325 MG tablet Take 2 tablets (650 mg total) by mouth every 6 (six) hours as needed for mild pain (or Fever >/= 101).  . Amino Acids-Protein Hydrolys (FEEDING SUPPLEMENT, PRO-STAT SUGAR FREE 64,) LIQD Take 30 mLs by mouth 2 (two) times a day.  . aspirin 81 MG chewable tablet Chew 81 mg by mouth daily.  . cefTRIAXone (ROCEPHIN) 2 g injection   . cefTRIAXone (ROCEPHIN) IVPB Inject 2 g into the vein daily. Indication:  Osteomyelitis of sacrum Last Day of Therapy:  01/07/2019 Labs - Once weekly:  CBC/D and BMP, Labs - Every other week:  ESR and CRP  . collagenase (SANTYL) ointment Apply  topically daily. Apply to sacral wound daily  . donepezil (ARICEPT) 10 MG tablet Take 10 mg by mouth at bedtime.  . ENULOSE 10 GM/15ML SOLN   . ipratropium-albuterol (DUONEB) 0.5-2.5 (3) MG/3ML SOLN Take 3 mLs by nebulization every 6 (six) hours as needed (for shortness of breath or wheezing).  . lactulose (CHRONULAC) 10 GM/15ML solution Take 20 g by mouth daily as needed for moderate constipation.   . levETIRAcetam (KEPPRA) 100 MG/ML solution Take 750 mg by mouth 2 (two) times daily.  . metoprolol tartrate (LOPRESSOR) 25 MG tablet Take 0.5 tablets (12.5 mg total) by mouth 2 (two) times daily.  . metroNIDAZOLE (FLAGYL) 50 mg/ml oral suspension Place 10 mLs (500 mg total) into feeding tube 3 (three) times daily. Give via tube until 01/07/2019  . metroNIDAZOLE (FLAGYL) 500 MG tablet   . Multiple Vitamins-Minerals (CERTA-VITE PO) Take 1 tablet by mouth daily.  . NYAMYC powder   . nystatin cream (MYCOSTATIN)   . polyethylene glycol (MIRALAX / GLYCOLAX) 17 g packet Take 17 g by mouth 2 (two) times daily.  . senna (SENOKOT) 8.6 MG TABS tablet Take 1 tablet (8.6 mg total) by mouth 2 (two) times daily.  . traMADol (ULTRAM) 50 MG tablet Take 1 tablet (50 mg total) by mouth every 6 (six) hours as needed for moderate pain.  . valproic acid (DEPAKENE) 250 MG/5ML SOLN solution Place 125 mg into feeding tube 2 (two) times daily.   . vancomycin (VANCOCIN) 1 g injection   . vancomycin (VANCOCIN) 750 MG   SOLR injection   . vancomycin IVPB Inject 750 mg into the vein every 12 (twelve) hours. Indication: osteomyelitis of sacrum Last Day of Therapy:  01/07/2019 Labs - Sunday/Monday:  CBC/D, BMP, and vancomycin trough. Labs - Thursday:  BMP and vancomycin trough Labs - Every other week:  ESR and CRP   No facility-administered encounter medications on file as of 01/07/2019.      Patient Active Problem List   Diagnosis Date Noted  . Osteomyelitis of vertebra, sacral and sacrococcygeal region ([place])   . Pressure  injury of skin 11/24/2018  . Acute metabolic encephalopathy 36/64/4034  . Cellulitis 11/23/2018  . Osteomyelitis ([place]) 11/23/2018  . Constipation 11/23/2018  . Goals of care, counseling/discussion   . Palliative care by specialist   . FTT (failure to thrive) in adult   . Type 2 diabetes mellitus without complication ([place]) 74/25/9563  . History of CVA with residual deficit 08/16/2018  . Hyperammonemia ([place]) 08/16/2018  . Bradycardia 08/16/2018  . Lactic acid increased   . Hypotension 06/03/2014  . Near syncope 06/03/2014  . AKI (acute kidney injury) ([place]) 06/03/2014  . Partial anterior cerebral circulation infarction ([place])   . Dementia with behavioral disturbance ([place])   . PAF (paroxysmal atrial fibrillation) ([place])   . History of seizures 05/15/2014  . Essential hypertension, benign 01/24/2013  . Unspecified late effects of cerebrovascular disease 01/24/2013  . Acute encephalopathy 11/26/2012  . Hypokalemia 11/26/2012  . Alcoholism in remission [facility]) 11/26/2012     Health Maintenance Due  Topic Date Due  . FOOT EXAM  11/29/1952  . OPHTHALMOLOGY EXAM  11/29/1952  . URINE MICROALBUMIN  11/29/1952  . INFLUENZA VACCINE  10/05/2018     Review of Systems  Physical Exam   BP (!) 158/91   Pulse 97   HR 110 with temp 100.31F gen = alert when his name is called,  Pulm= ctab Cors= tachy, no murmur Back = non blanching erythema Buttock = macerated, non blanching erythema, candidal infection Ext= picc line right upper extremity is exposed with surrounding erythema Abd= bandage about peg is moist/with erythema at hub Lab Results  Component Value Date   HIV1RNAQUANT <20 11/25/2012   No results found for: HEPBSAB Lab Results  Component Value Date   LABRPR Reactive (A) 11/24/2012    CBC Lab Results  Component Value Date   WBC 6.2 11/28/2018   RBC 3.54 (L) 11/28/2018   HGB 11.0 (L) 11/28/2018   HCT 35.1 (L) 11/28/2018   PLT 309 11/28/2018   MCV 99.2 11/28/2018   MCH  31.1 11/28/2018   MCHC 31.3 11/28/2018   RDW 15.2 11/28/2018   LYMPHSABS 1.7 11/25/2018   MONOABS 0.7 11/25/2018   EOSABS 0.1 11/25/2018    BMET Lab Results  Component Value Date   NA 139 11/27/2018   K 3.2 (L) 11/27/2018   CL 105 11/27/2018   CO2 26 11/27/2018   GLUCOSE 78 11/27/2018   BUN 9 11/27/2018   CREATININE 0.71 11/27/2018   CALCIUM 8.9 11/27/2018   GFRNONAA >60 11/27/2018   GFRAA >60 11/27/2018      Assessment and Plan  Sacral osteomyelitis = needs better wound care appears worse incomparison to photos in chart. Needs more frequent dressing changes  Fever/tachycardia = unclear if he has another infection of other source. His picc line region does appear erythematous. picc line removed  Transferred to ED for further eval. And management  We have contacted [facility] and his niece Risk analyst) for updates

## 2019-01-07 NOTE — Progress Notes (Signed)
Pharmacy Antibiotic Note  Joshua Gonzales is a 78 y.o. male admitted on 01/07/2019 for fever. Patient has been receiving 6 weeks of vancomycin, ceftriaxone, and metronidazole for sacral osteomyelitis (planned to end today, 01/07/2019). During office visit with ID today patient was tachycardic with temperature documented as 100.3. PICC line was exposed and erythematous and PICC was then removed.  Pharmacy has been consulted for Zosyn and vancomycin dosing for wound infection. WBC wnl. No temperature reported in ED.   Of note, outpatient vancomycin dose was 750mg  IV q12h. Patient reports last dose of vancomycin yesterday.     Plan: Resume outpatient regimen of vancomycin 750mg  IV q12h Do not load vancomycin at this time Zosyn 3.375g x1 (30 minute infusion) Start Zosyn 3.3.75 IV q8h (4 hours infusion) Obtain vancomycin levels as indicated  Monitor renal function, cultures/sensitivities, and clinical progression   Height: 5\' 11"  (180.3 cm) Weight: 163 lb 12.8 oz (74.3 kg) IBW/kg (Calculated) : 75.3  No data recorded.  Recent Labs  Lab 01/07/19 1624  WBC 6.2    CrCl cannot be calculated (Patient's most recent lab result is older than the maximum 21 days allowed.).    No Known Allergies  Antimicrobials this admission: Vancomycin 11/3 >> Zosyn 11/3 >>  Dose adjustments this admission: N/A  Microbiology results: 11/3 BCx: collected 11/3 UCx: collected   Thank you for allowing pharmacy to be a part of this patient's care.  Cristela Felt, PharmD PGY1 Pharmacy Resident [employer]: [phone]   01/07/2019 5:11 PM

## 2019-01-07 NOTE — ED Provider Notes (Signed)
[facility] Provider Note   CSN: [id number] Arrival date & time: 01/07/19  1537     History   Chief Complaint Chief Complaint  Patient presents with  . Blood Infection    HPI Joshua Gonzales is a 78 y.o. male.     78 yo M with a chief complaint of a fever.  This was noted incidentally when he went to see his infectious disease physician for a checkup to see if he could stop antibiotics for sacral osteomyelitis.  Patient has been on 6 weeks of vancomycin ceftriaxone and Flagyl treatment.  He was in the hospital at the onset of this and had a blood culture that was thought to be a contaminant versus infection due to her prior PICC line.  His ID doctor today pulled his PICC line is as it appeared to have been partially dislodged.  He was then sent here for evaluation.  Patient states that he has not had fevers as far as he knows.  He is unsure about how his wound is doing but thinks it may be better.  He denies any cough congestion denies any dysuria increased frequency or hesitancy denies abdominal pain denies headache or neck stiffness.  The patient does have a diagnosis of dementia in his chart and this may be limiting his history.  The history is provided by the patient.  Illness Severity:  Moderate Onset quality:  Gradual Duration:  1 day Timing:  Constant Progression:  Unchanged Chronicity:  New Associated symptoms: fatigue   Associated symptoms: no abdominal pain, no chest pain, no congestion, no diarrhea, no fever, no headaches, no myalgias, no rash, no shortness of breath and no vomiting     Past Medical History:  Diagnosis Date  . Alzheimer disease ([place])   . Hypertension   . Seizures [facility])     Patient Active Problem List   Diagnosis Date Noted  . Sacral wound 01/07/2019  . HTN (hypertension) 01/07/2019  . Alzheimer's dementia ([place]) 01/07/2019  . Osteomyelitis of vertebra, sacral and sacrococcygeal region ([place])   .  Pressure injury of skin 11/24/2018  . Acute metabolic encephalopathy 89/37/3428  . Cellulitis 11/23/2018  . Osteomyelitis ([place]) 11/23/2018  . Constipation 11/23/2018  . Goals of care, counseling/discussion   . Palliative care by specialist   . FTT (failure to thrive) in adult   . Type 2 diabetes mellitus without complication ([place]) 76/81/1572  . History of CVA with residual deficit 08/16/2018  . Hyperammonemia ([place]) 08/16/2018  . Bradycardia 08/16/2018  . Lactic acid increased   . Hypotension 06/03/2014  . Near syncope 06/03/2014  . AKI (acute kidney injury) ([place]) 06/03/2014  . Partial anterior cerebral circulation infarction ([place])   . Dementia with behavioral disturbance ([place])   . PAF (paroxysmal atrial fibrillation) ([place])   . History of seizures 05/15/2014  . Essential hypertension, benign 01/24/2013  . Unspecified late effects of cerebrovascular disease 01/24/2013  . Acute encephalopathy 11/26/2012  . Hypokalemia 11/26/2012  . Alcoholism in remission ([place]) 11/26/2012    Past Surgical History:  Procedure Laterality Date  . IR GASTROSTOMY TUBE MOD SED  09/30/2018        Home Medications    Prior to Admission medications   Medication Sig Start Date End Date Taking? Authorizing Provider  acetaminophen (TYLENOL) 325 MG tablet Take 2 tablets (650 mg total) by mouth every 6 (six) hours as needed for mild pain (or Fever >/= 101). 11/28/18  Yes Rai, Ripudeep K,  MD  aspirin 81 MG chewable tablet Chew 81 mg by mouth daily.   Yes [provider]  cefTRIAXone (ROCEPHIN) IVPB Inject 2 g into the vein daily. Indication:  Osteomyelitis of sacrum Last Day of Therapy:  01/07/2019 Labs - Once weekly:  CBC/D and BMP, Labs - Every other week:  ESR and CRP 11/28/18 01/08/19 Yes Rai, Ripudeep K, MD  ipratropium-albuterol (DUONEB) 0.5-2.5 (3) MG/3ML SOLN Take 3 mLs by nebulization every 6 (six) hours as needed (for shortness of breath or wheezing).   Yes [provider]   levETIRAcetam (KEPPRA) 100 MG/ML solution Take 750 mg by mouth 2 (two) times daily.   Yes [provider]  metoprolol tartrate (LOPRESSOR) 25 MG tablet Take 0.5 tablets (12.5 mg total) by mouth 2 (two) times daily. 11/28/18  Yes Rai, Ripudeep K, MD  Multiple Vitamins-Minerals (CERTA-VITE PO) Take 1 tablet by mouth daily.   Yes [provider]  polyethylene glycol (MIRALAX / GLYCOLAX) 17 g packet Take 17 g by mouth 2 (two) times daily. 11/28/18  Yes Rai, Ripudeep K, MD  senna (SENOKOT) 8.6 MG TABS tablet Take 1 tablet (8.6 mg total) by mouth 2 (two) times daily. 11/28/18  Yes Rai, Ripudeep K, MD  traMADol (ULTRAM) 50 MG tablet Take 1 tablet (50 mg total) by mouth every 6 (six) hours as needed for moderate pain. 11/28/18  Yes Rai, Ripudeep K, MD  valproic acid (DEPAKENE) 250 MG/5ML SOLN solution Place 125 mg into feeding tube 2 (two) times daily.  11/09/18  Yes [provider]  Amino Acids-Protein Hydrolys (FEEDING SUPPLEMENT, PRO-STAT SUGAR FREE 64,) LIQD Take 30 mLs by mouth 2 (two) times a day.    [provider]  collagenase (SANTYL) ointment Apply topically daily. Apply to sacral wound daily 11/28/18   Rai, Ripudeep K, MD  donepezil (ARICEPT) 10 MG tablet Take 10 mg by mouth at bedtime.    [provider]  ENULOSE 10 GM/15ML SOLN  11/29/18   [provider]  lactulose (CHRONULAC) 10 GM/15ML solution Take 20 g by mouth daily as needed for moderate constipation.     [provider]  metroNIDAZOLE (FLAGYL) 50 mg/ml oral suspension Place 10 mLs (500 mg total) into feeding tube 3 (three) times daily. Give via tube until 01/07/2019 11/28/18 01/09/19  Mendel Corning, MD  [facility] powder  12/23/18   [provider]  nystatin cream (MYCOSTATIN)  12/23/18   [provider]  risperiDONE (RISPERDAL M-TABS) 0.5 MG disintegrating tablet Take by mouth. 06/09/16   [provider]  vancomycin (VANCOCIN) 1 g injection  01/04/19   [provider]  vancomycin IVPB Inject 750 mg into the vein every 12 (twelve) hours. Indication: osteomyelitis of sacrum Last Day of Therapy:  01/07/2019 Labs - Sunday/Monday:  CBC/D, BMP, and vancomycin trough. Labs - Thursday:  BMP and vancomycin trough Labs - Every other week:  ESR and CRP Patient not taking: Reported on 01/07/2019 11/28/18 01/08/19  Mendel Corning, MD    Family History Family History  Problem Relation Age of Onset  . Hyperlipidemia Mother   . Hypertension Mother   . Hypertension Father   . Hyperlipidemia Father     Social History Social History   Tobacco Use  . Smoking status: Former Smoker  Substance Use Topics  . Alcohol use: Yes  . Drug use: Not on file     Allergies   Patient has no known allergies.   Review of Systems Review of Systems  Constitutional: Positive for fatigue. Negative for chills and fever.  HENT: Negative for congestion and facial swelling.   Eyes: Negative for discharge and visual disturbance.  Respiratory: Negative for shortness of breath.   Cardiovascular: Negative for chest pain and palpitations.  Gastrointestinal: Negative for abdominal pain, diarrhea and vomiting.  Musculoskeletal: Negative for arthralgias and myalgias.  Skin: Positive for wound. Negative for color change and rash.  Neurological: Negative for tremors, syncope and headaches.  Psychiatric/Behavioral: Negative for confusion and dysphoric mood.     Physical Exam Updated Vital Signs BP (!) 152/93   Pulse 95   Temp 98.1 F (36.7 C) (Rectal)   Resp 17   Ht _0  (1.803 m)   Wt 74.3 kg   SpO2 95%   BMI 22.85 kg/m   Physical Exam Vitals signs and nursing note reviewed.  Constitutional:      Appearance: He is well-developed. He is ill-appearing.     Comments: Chronically ill appearing  HENT:     Head: Normocephalic and atraumatic.  Eyes:     Pupils: Pupils are equal, round, and reactive to light.  Neck:     Musculoskeletal: Normal range of  motion and neck supple.     Vascular: No JVD.  Cardiovascular:     Rate and Rhythm: Normal rate and regular rhythm.     Heart sounds: No murmur. No friction rub. No gallop.   Pulmonary:     Effort: No respiratory distress.     Breath sounds: No wheezing.  Abdominal:     General: There is no distension.     Tenderness: There is no abdominal tenderness. There is no guarding or rebound.  Musculoskeletal:     Comments: Slightly smaller than a fist-sized wound to the sacrum.  No obvious drainage induration or fluctuance surrounding.  He does have some spreading erythema that is blanching.  Wound has been packed by prior provider.  Contractures to all 4 extemities  Skin:    Coloration: Skin is not pale.     Findings: No rash.  Neurological:     Mental Status: He is alert.     Comments: Confused response, answer yes no questions,   Psychiatric:        Behavior: Behavior normal.      ED Treatments / Results  Labs (all labs ordered are listed, but only abnormal results are displayed) Labs Reviewed  COMPREHENSIVE METABOLIC PANEL - Abnormal; Notable for the following components:      Result Value   Glucose, Bld 108 (*)    Total Protein 8.2 (*)    Albumin 2.8 (*)    All other components within normal limits  CBC WITH DIFFERENTIAL/PLATELET - Abnormal; Notable for the following components:   RDW 16.1 (*)    All other components within normal limits  URINALYSIS, ROUTINE W REFLEX MICROSCOPIC - Abnormal; Notable for the following components:   Protein, ur 30 (*)    Leukocytes,Ua LARGE (*)    Bacteria, UA RARE (*)    All other components within normal limits  CULTURE, BLOOD (ROUTINE X 2)  CULTURE, BLOOD (ROUTINE X 2)  URINE CULTURE  SARS CORONAVIRUS 2 (TAT 6-24 HRS)  LACTIC ACID, PLASMA  CBC  CREATININE, SERUM  COMPREHENSIVE METABOLIC PANEL  CBC    EKG EKG Interpretation  Date/Time:  Tuesday January 07 2019 16:08:09 EST Ventricular Rate:  88 PR Interval:    QRS Duration:  86 QT Interval:  359 QTC Calculation: 435 R Axis:   -169 Text  Interpretation: Age not entered, assumed to be  78 years old for purpose of ECG interpretation Right and left arm electrode reversal, interpretation assumes no reversal Sinus rhythm Atrial premature complex Abnormal lateral Q waves Probable anterior infarct, age indeterminate No significant change since last tracing Confirmed by Deno Etienne [phone]) on 01/07/2019 4:20:55 PM   Radiology Dg Pelvis Portable  Result Date: 01/07/2019 CLINICAL DATA:  Large sacral decubitus ulcer. EXAM: PORTABLE PELVIS 1-2 VIEWS COMPARISON:  CT abdomen and pelvis from 11/22/2017 FINDINGS: Assessment limited by overlying bowel gas with respect to this bony sacrum. Periosteal reaction and bony destruction exhibited on recent CT and MRI not well evaluated. Rotation of the current exam mildly limits assessment. No additional acute bone process. IMPRESSION: Limited assessment in the area of concern in this patient with proven osteomyelitis of the sacrococcygeal region. No additional acute process. Electronically Signed   By: Zetta Bills M.D.   On: 01/07/2019 17:06   Dg Chest Port 1 View  Result Date: 01/07/2019 CLINICAL DATA:  Tachycardic and fatigue. EXAM: PORTABLE CHEST 1 VIEW COMPARISON:  11/23/2018 FINDINGS: Increased interstitial prominence and areas of lucency in the right upper lobe. Cardiomediastinal contours are stable. Increasing retrocardiac opacification and vascular congestion. No acute bone process. IMPRESSION: Increasing interstitial prominence may reflect mild asymmetric edema or pneumonitis superimposed on emphysematous changes. No signs of dense consolidation or pleural effusion. Electronically Signed   By: Zetta Bills M.D.   On: 01/07/2019 17:03    Procedures Procedures (including critical care time)  Medications Ordered in ED Medications  vancomycin (VANCOCIN) IVPB 750 mg/150 ml premix (750 mg Intravenous New Bag/Given 01/07/19 1846)   piperacillin-tazobactam (ZOSYN) IVPB 3.375 g (has no administration in time range)  enoxaparin (LOVENOX) injection 40 mg (has no administration in time range)  0.9 %  sodium chloride infusion (has no administration in time range)  ondansetron (ZOFRAN) tablet 4 mg (has no administration in time range)    Or  ondansetron (ZOFRAN) injection 4 mg (has no administration in time range)  morphine 2 MG/ML injection 2 mg (has no administration in time range)  0.9 %  sodium chloride infusion (has no administration in time range)  sodium chloride 0.9 % bolus 1,000 mL (1,000 mLs Intravenous New Bag/Given 01/07/19 1640)  piperacillin-tazobactam (ZOSYN) IVPB 3.375 g (0 g Intravenous Stopped 01/07/19 1820)     Initial Impression / Assessment and Plan / ED Course  I have reviewed the triage vital signs and the nursing notes.  Pertinent labs & imaging results that were available during my care of the patient were reviewed by me and considered in my medical decision making (see chart for details).        78 yo M with a chief complaints of a fever.  This was noted incidentally at his ID doctor's visit.  Per Dr. Crissie Figures note she was concerned about his fever tachycardia and that his PICC line had been partially dislodged.  She pulled this in the office and thought that it had some fibrinous debris at the tip of the catheter.  She sent him here for evaluation of infection may be due to a PICC line infection or worsening of his wound, per her evaluation she thought it looked worse than pictures of the been taking of it previously.  We will obtain lab work give a bolus of IV fluids start the patient on broad-spectrum antibiotics and will admit to the hospital.  Lactate normal, no leukocytosis.  No significant electrolyte abnormality.  Renal function at  baseline.  Will discuss with hospitalist.   The patients results and plan were reviewed and discussed.   Any x-rays performed were independently reviewed by  myself.   Differential diagnosis were considered with the presenting HPI.  Medications  vancomycin (VANCOCIN) IVPB 750 mg/150 ml premix (750 mg Intravenous New Bag/Given 01/07/19 1846)  piperacillin-tazobactam (ZOSYN) IVPB 3.375 g (has no administration in time range)  enoxaparin (LOVENOX) injection 40 mg (has no administration in time range)  0.9 %  sodium chloride infusion (has no administration in time range)  ondansetron (ZOFRAN) tablet 4 mg (has no administration in time range)    Or  ondansetron (ZOFRAN) injection 4 mg (has no administration in time range)  morphine 2 MG/ML injection 2 mg (has no administration in time range)  0.9 %  sodium chloride infusion (has no administration in time range)  sodium chloride 0.9 % bolus 1,000 mL (1,000 mLs Intravenous New Bag/Given 01/07/19 1640)  piperacillin-tazobactam (ZOSYN) IVPB 3.375 g (0 g Intravenous Stopped 01/07/19 1820)    Vitals:   01/07/19 1700 01/07/19 1715 01/07/19 1730 01/07/19 1735  BP: (!) 152/110 (!) 152/92 (!) 152/93   Pulse: 97 75 95   Resp: (!) 23 (!) 26 17   Temp:    98.1 F (36.7 C)  TempSrc:    Rectal  SpO2: 96% 94% 95%   Weight: 74.3 kg     Height: _0  (1.803 m)       Final diagnoses:  Fever of unknown origin  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

## 2019-01-07 NOTE — ED Triage Notes (Signed)
Per [facility], pt brought from pcp across the street after going to an appointment and found to be febrile and tachycardic. Pt had a PICC line in that was removed at pcp because it was exposed and hanging out and he also has a full thickness sacral wound that was packed pta by pcp. NH states that pt is at his baseline mentally with dementia.

## 2019-01-07 NOTE — H&P (Signed)
History and Physical    Joshua FENTER FBP:[id number] DOB: 26-Sep-1940 DOA: 01/07/2019  PCP: Seward Carol, MD  Patient coming from: ID's office  I have personally briefly reviewed patient's old medical records in [place]  Chief Complaint: Concern for HPI: Joshua Gonzales is a 78 y.o. 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 Flagyl per PEG tube), Alzheimer's dementia, hypertension, bedbound with contractures came from ID office for the concern of worsening sacral osteomyelitis and PICC line infection.  Patient is not good historian due to history of underlying dementia.  History gathered from the charts.  Patient has intermittent fever at SNF.  Patient was found to have tachycardia with fever of 100.3 at office this morning.  He appears to be diaphoretic, and there was erythema around his PICC line.  PICC line was pulled out.  Patient sent to emergency department for further for the concern of SIRS/sepsis.  Patient is DNR-reviewed document from SNF and previous hospitalization.  ED Course: Upon arrival: Patient's blood pressure is elevated.  CBC, CMP, lactic acid: WNL.  UA/UC/BC/COVID-19 all are pending.  Pelvic x-ray: obtained.  Patient received IV fluids and Vanco and Zosyn in the ED.  review of Systems: As per HPI otherwise negative.    Past Medical History:  Diagnosis Date   Alzheimer disease ([place])    Hypertension    Seizures ([place])     Past Surgical History:  Procedure Laterality Date   IR GASTROSTOMY TUBE MOD SED  09/30/2018     reports that he has quit smoking. He does not have any smokeless tobacco history on file. He reports current alcohol use. No history on file for drug.  No Known Allergies  Family History  Problem Relation Age of Onset   Hyperlipidemia Mother      Hypertension Mother    Hypertension Father    Hyperlipidemia Father     Prior to Admission medications   Medication Sig Start Date End Date Taking? Authorizing Provider  acetaminophen (TYLENOL) 325 MG tablet Take 2 tablets (650 mg total) by mouth every 6 (six) hours as needed for mild pain (or Fever >/= 101). 11/28/18   Rai, Ripudeep Raliegh Ip, MD  Amino Acids-Protein Hydrolys (FEEDING SUPPLEMENT, PRO-STAT SUGAR FREE 64,) LIQD Take 30 mLs by mouth 2 (two) times a day.    [provider]  aspirin 81 MG chewable tablet Chew 81 mg by mouth daily.    [provider]  cefTRIAXone (ROCEPHIN) 2 g injection  01/03/19   [provider]  cefTRIAXone (ROCEPHIN) IVPB Inject 2 g into the vein daily. Indication:  Osteomyelitis of sacrum Last Day of Therapy:  01/07/2019 Labs - Once weekly:  CBC/D and BMP, Labs - Every other week:  ESR and CRP 11/28/18 01/08/19  Rai, Ripudeep K, MD  collagenase (SANTYL) ointment Apply topically daily. Apply to sacral wound daily 11/28/18   Rai, Ripudeep K, MD  donepezil (ARICEPT) 10 MG tablet Take 10 mg by mouth at bedtime.    [provider]  ENULOSE 10 GM/15ML SOLN  11/29/18   [provider]  ipratropium-albuterol (DUONEB) 0.5-2.5 (3) MG/3ML SOLN Take 3 mLs by nebulization every 6 (six) hours as needed (for shortness of breath or wheezing).    [provider]  lactulose (CHRONULAC) 10 GM/15ML solution Take 20 g by mouth daily as needed for  moderate constipation.     [provider]  levETIRAcetam (KEPPRA) 100 MG/ML solution Take 750 mg by mouth 2 (two) times daily.    [provider]  metoprolol tartrate (LOPRESSOR) 25 MG tablet Take 0.5 tablets (12.5 mg total) by mouth 2 (two) times daily. 11/28/18   Rai, Vernelle Emerald, MD  metroNIDAZOLE (FLAGYL) 50 mg/ml oral suspension Place 10 mLs (500 mg total) into feeding tube 3 (three) times daily. Give via tube until 01/07/2019 11/28/18 01/09/19  Mendel Corning, MD   metroNIDAZOLE (FLAGYL) 500 MG tablet  12/27/18   [provider]  Multiple Vitamins-Minerals (CERTA-VITE PO) Take 1 tablet by mouth daily.    [provider]  [facility] powder  12/23/18   [provider]  nystatin cream (MYCOSTATIN)  12/23/18   [provider]  polyethylene glycol (MIRALAX / GLYCOLAX) 17 g packet Take 17 g by mouth 2 (two) times daily. 11/28/18   Rai, Ripudeep K, MD  risperiDONE (RISPERDAL M-TABS) 0.5 MG disintegrating tablet Take by mouth. 06/09/16   [provider]  senna (SENOKOT) 8.6 MG TABS tablet Take 1 tablet (8.6 mg total) by mouth 2 (two) times daily. 11/28/18   Rai, Vernelle Emerald, MD  traMADol (ULTRAM) 50 MG tablet Take 1 tablet (50 mg total) by mouth every 6 (six) hours as needed for moderate pain. 11/28/18   Rai, Vernelle Emerald, MD  valproic acid (DEPAKENE) 250 MG/5ML SOLN solution Place 125 mg into feeding tube 2 (two) times daily.  11/09/18   [provider]  vancomycin Zollie Pee) 1 g injection  01/04/19   [provider]  vancomycin (VANCOCIN) 750 MG SOLR injection  12/15/18   [provider]  vancomycin IVPB Inject 750 mg into the vein every 12 (twelve) hours. Indication: osteomyelitis of sacrum Last Day of Therapy:  01/07/2019 Labs - Sunday/Monday:  CBC/D, BMP, and vancomycin trough. Labs - Thursday:  BMP and vancomycin trough Labs - Every other week:  ESR and CRP 11/28/18 01/08/19  Mendel Corning, MD    Physical Exam: Vitals:   01/07/19 1700 01/07/19 1715 01/07/19 1730 01/07/19 1735  BP: (!) 152/110 (!) 152/92 (!) 152/93   Pulse: 97 75 95   Resp: (!) 23 (!) 26 17   Temp:    98.1 F (36.7 C)  TempSrc:    Rectal  SpO2: 96% 94% 95%   Weight: 74.3 kg     Height: _0  (1.803 m)       Constitutional: NAD, calm, comfortable Eyes: PERRL, lids and conjunctivae normal ENMT: Mucous membranes are moist. Posterior pharynx clear of any exudate or lesions.Normal dentition.  Neck: normal, supple, no masses,  no thyromegaly Respiratory: clear to auscultation bilaterally, no wheezing, no crackles. Normal respiratory effort. No accessory muscle use.  Cardiovascular: Regular rate and rhythm, no murmurs / rubs / gallops. No extremity edema. 2+ pedal pulses. No carotid bruits.  Abdomen: no tenderness, no masses palpated. No hepatosplenomegaly. Bowel sounds positive.  Musculoskeletal: Has contractures in bilateral lower extremities.   Skin:        Neurologic: Alert, following commands.  Labs on Admission: I have personally reviewed following labs and imaging studies  CBC: Recent Labs  Lab 01/07/19 1624  WBC 6.2  NEUTROABS 3.5  HGB 13.7  HCT 45.0  MCV 98.5  PLT 891   Basic Metabolic Panel: Recent Labs  Lab 01/07/19 1624  NA 140  K 4.3  CL 103  CO2 27  GLUCOSE 108*  BUN 15  CREATININE 0.70  CALCIUM 9.2   GFR: Estimated Creatinine Clearance: 82.6 mL/min (by C-G formula based on SCr of 0.7 mg/dL). Liver Function Tests: Recent Labs  Lab 01/07/19 1624  AST 27  ALT 15  ALKPHOS 75  BILITOT 0.5  PROT 8.2*  ALBUMIN 2.8*   No results for input(s): LIPASE, AMYLASE in the last 168 hours. No results for input(s): AMMONIA in the last 168 hours. Coagulation Profile: No results for input(s): INR, PROTIME in the last 168 hours. Cardiac Enzymes: No results for input(s): CKTOTAL, CKMB, CKMBINDEX, TROPONINI in the last 168 hours. BNP (last 3 results) No results for input(s): PROBNP in the last 8760 hours. HbA1C: No results for input(s): HGBA1C in the last 72 hours. CBG: No results for input(s): GLUCAP in the last 168 hours. Lipid Profile: No results for input(s): CHOL, HDL, LDLCALC, TRIG, CHOLHDL, LDLDIRECT in the last 72 hours. Thyroid Function Tests: No results for input(s): TSH, T4TOTAL, FREET4, T3FREE, THYROIDAB in the last 72 hours. Anemia Panel: No results for input(s): VITAMINB12, FOLATE, FERRITIN, TIBC, IRON, RETICCTPCT in the last 72 hours. Urine analysis:      Component Value Date/Time   COLORURINE YELLOW 11/23/2018 0859   APPEARANCEUR CLEAR 11/23/2018 0859   LABSPEC 1.034 (H) 11/23/2018 0859   PHURINE 5.0 11/23/2018 0859   GLUCOSEU NEGATIVE 11/23/2018 0859   HGBUR NEGATIVE 11/23/2018 0859   BILIRUBINUR NEGATIVE 11/23/2018 0859   KETONESUR NEGATIVE 11/23/2018 0859   PROTEINUR NEGATIVE 11/23/2018 0859   UROBILINOGEN 0.2 06/03/2014 1649   NITRITE NEGATIVE 11/23/2018 0859   LEUKOCYTESUR NEGATIVE 11/23/2018 0859    Radiological Exams on Admission: Dg Pelvis Portable  Result Date: 01/07/2019 CLINICAL DATA:  Large sacral decubitus ulcer. EXAM: PORTABLE PELVIS 1-2 VIEWS COMPARISON:  CT abdomen and pelvis from 11/22/2017 FINDINGS: Assessment limited by overlying bowel gas with respect to this bony sacrum. Periosteal reaction and bony destruction exhibited on recent CT and MRI not well evaluated. Rotation of the current exam mildly limits assessment. No additional acute bone process. IMPRESSION: Limited assessment in the area of concern in this patient with proven osteomyelitis of the sacrococcygeal region. No additional acute process. Electronically Signed   By: Zetta Bills M.D.   On: 01/07/2019 17:06   Dg Chest Port 1 View  Result Date: 01/07/2019 CLINICAL DATA:  Tachycardic and fatigue. EXAM: PORTABLE CHEST 1 VIEW COMPARISON:  11/23/2018 FINDINGS: Increased interstitial prominence and areas of lucency in the right upper lobe. Cardiomediastinal contours are stable. Increasing retrocardiac opacification and vascular congestion. No acute bone process. IMPRESSION: Increasing interstitial prominence may reflect mild asymmetric edema or pneumonitis superimposed on emphysematous changes. No signs of dense consolidation or pleural effusion. Electronically Signed   By: Zetta Bills M.D.   On: 01/07/2019 17:03    EKG: Normal sinus rhythm, no ST elevation or depression noted.  Assessment/Plan Active Problems:   Type 2 diabetes mellitus without  complication (HCC)   Sacral wound   HTN (hypertension)   Alzheimer's dementia ([place])    Sacrococcygeal osteomyelitis/PICC line infection? -Patient treated with IV vancomycin, Rocephin and Flagyl for 6 weeks. -Patient afebrile, no leukocytosis, lactic acid: WNL.  Vital signs stable. -Pelvic x-ray as above.  UA/UC/BC/COVID-19 all are pending. -Patient started on broad-spectrum antibiotics IV Vanco and Zosyn-we will continue same. -We will start on IV fluids.  MRI of sacrum is pending. -Consulted wound care. -Talked to ID on-call Dr. Dietrich Pates dam-recommended to continue antibiotics and IV fluids. ID will follow  Hypertension: Elevated upon arrival -Likely due to pain. -We will continue  metoprolol.  Alzheimer's dementia: Continue Aricept.  Diabetes mellitus: Well controlled.  Last A1c 5.8% -Monitor blood sugar.  History of CVA: Stable -We will continue aspirin.  History of neurosyphilis: -Finished treatment in 2014.  Paroxysmal A. fib: Rate controlled. -We will continue metoprolol.  On telemetry.   DVT prophylaxis: TED/SCD/Lovenox  code Status: DNR Family Communication: None present at bedside.  Plan of care discussed with patient in length and he verbalized understanding and agreed with it. Disposition Plan: TBD Consults called: ID Admission status: Inpatient  Mckinley Jewel MD [facility] Pager [phone]  If 7PM-7AM, please contact night-coverage [email or web address] Password [email or web address]  01/07/2019, 7:05 PM

## 2019-01-07 NOTE — Telephone Encounter (Signed)
Patient's SNF notified that after evaluation the patient will be taken to the emergency department at [facility]. Left message with front desk and asked them to direct message to patient's RN. Was transferred twice to the RN who wasn't able to answer. DON was unavailable to take message.   Chima Astorino Lorita Officer, RN

## 2019-01-07 NOTE — ED Notes (Signed)
Donell Sievert from facility called, looking for update. Gave family info: Dahlia Byes - Niece - [phone]

## 2019-01-08 ENCOUNTER — Inpatient Hospital Stay (HOSPITAL_COMMUNITY): Payer: Medicare Other

## 2019-01-08 DIAGNOSIS — L89159 Pressure ulcer of sacral region, unspecified stage: Secondary | ICD-10-CM

## 2019-01-08 DIAGNOSIS — Z7401 Bed confinement status: Secondary | ICD-10-CM

## 2019-01-08 DIAGNOSIS — E46 Unspecified protein-calorie malnutrition: Secondary | ICD-10-CM

## 2019-01-08 DIAGNOSIS — F0280 Dementia in other diseases classified elsewhere without behavioral disturbance: Secondary | ICD-10-CM

## 2019-01-08 DIAGNOSIS — M6249 Contracture of muscle, multiple sites: Secondary | ICD-10-CM

## 2019-01-08 DIAGNOSIS — Z87891 Personal history of nicotine dependence: Secondary | ICD-10-CM

## 2019-01-08 DIAGNOSIS — G309 Alzheimer's disease, unspecified: Secondary | ICD-10-CM

## 2019-01-08 DIAGNOSIS — M4628 Osteomyelitis of vertebra, sacral and sacrococcygeal region: Secondary | ICD-10-CM

## 2019-01-08 DIAGNOSIS — L89101 Pressure ulcer of unspecified part of back, stage 1: Secondary | ICD-10-CM

## 2019-01-08 LAB — COMPREHENSIVE METABOLIC PANEL
ALT: 14 U/L (ref 0–44)
AST: 23 U/L (ref 15–41)
Albumin: 2.5 g/dL — ABNORMAL LOW (ref 3.5–5.0)
Alkaline Phosphatase: 54 U/L (ref 38–126)
Anion gap: 10 (ref 5–15)
BUN: 11 mg/dL (ref 8–23)
CO2: 25 mmol/L (ref 22–32)
Calcium: 8.8 mg/dL — ABNORMAL LOW (ref 8.9–10.3)
Chloride: 106 mmol/L (ref 98–111)
Creatinine, Ser: 0.66 mg/dL (ref 0.61–1.24)
GFR calc Af Amer: >60 mL/min (ref >60)
GFR calc non Af Amer: >60 mL/min (ref >60)
Glucose, Bld: 90 mg/dL (ref 70–99)
Potassium: 3.9 mmol/L (ref 3.5–5.1)
Sodium: 141 mmol/L (ref 135–145)
Total Bilirubin: 0.5 mg/dL (ref 0.3–1.2)
Total Protein: 7.1 g/dL (ref 6.5–8.1)

## 2019-01-08 LAB — CBC
HCT: 40.6 % (ref 39.0–52.0)
HCT: 41.6 % (ref 39.0–52.0)
Hemoglobin: 12.9 g/dL — ABNORMAL LOW (ref 13.0–17.0)
Hemoglobin: 13 g/dL (ref 13.0–17.0)
MCH: 30.5 pg (ref 26.0–34.0)
MCH: 30.2 pg (ref 26.0–34.0)
MCHC: 31.8 g/dL (ref 30.0–36.0)
MCHC: 31.3 g/dL (ref 30.0–36.0)
MCV: 96 fL (ref 80.0–100.0)
MCV: 96.7 fL (ref 80.0–100.0)
Platelets: 223 10*3/uL (ref 150–400)
Platelets: 228 10*3/uL (ref 150–400)
RBC: 4.23 MIL/uL (ref 4.22–5.81)
RBC: 4.3 MIL/uL (ref 4.22–5.81)
RDW: 16.1 % — ABNORMAL HIGH (ref 11.5–15.5)
RDW: 16.2 % — ABNORMAL HIGH (ref 11.5–15.5)
WBC: 6.6 10*3/uL (ref 4.0–10.5)
WBC: 5.9 10*3/uL (ref 4.0–10.5)
nRBC: 0 % (ref 0.0–0.2)
nRBC: 0 % (ref 0.0–0.2)

## 2019-01-08 LAB — CREATININE, SERUM
Creatinine, Ser: 0.65 mg/dL (ref 0.61–1.24)
GFR calc Af Amer: >60 mL/min (ref >60)
GFR calc non Af Amer: >60 mL/min (ref >60)

## 2019-01-08 MED ORDER — FLUCONAZOLE 40 MG/ML PO SUSR
100.0000 mg | Freq: Every day | ORAL | Status: AC
  Administered 2019-01-08 – 2019-01-14 (×7): 100 mg via ORAL
  Filled 2019-01-08 (×8): qty 2.5

## 2019-01-08 MED ORDER — DONEPEZIL HCL 10 MG PO TABS
10.0000 mg | ORAL_TABLET | Freq: Every day | ORAL | Status: DC
  Administered 2019-01-08 – 2019-01-13 (×6): 10 mg
  Filled 2019-01-08 (×7): qty 1

## 2019-01-08 MED ORDER — VALPROIC ACID 250 MG/5ML PO SOLN
125.0000 mg | Freq: Two times a day (BID) | ORAL | Status: DC
  Administered 2019-01-08 – 2019-01-14 (×13): 125 mg
  Filled 2019-01-08 (×14): qty 5

## 2019-01-08 MED ORDER — LEVETIRACETAM 100 MG/ML PO SOLN
750.0000 mg | Freq: Two times a day (BID) | ORAL | Status: DC
  Administered 2019-01-08 – 2019-01-14 (×13): 750 mg
  Filled 2019-01-08 (×14): qty 7.5

## 2019-01-08 NOTE — ED Notes (Signed)
Lunch tray ordered 

## 2019-01-08 NOTE — Progress Notes (Signed)
PROGRESS NOTE    Joshua Gonzales  DTO:[id number] DOB: 1940-12-22 DOA: 01/07/2019 PCP: Seward Carol, MD   Brief Narrative:  78 year old who is a resident of [facility], essential hypertension, Alzheimer's dementia, bedbound with contractures was diagnosed with sacral osteomyelitis complicated due to sacral decubitus ulcer has been on 6 weeks of IV antibiotics-IV vancomycin, Rocephin and oral Flagyl via PEG tube was sent to the hospital from the ID office for worsening of this infection.  Very poor historian.   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  During his follow-up appointment 01/07/2019-showed worsening of his infection therefore sent to the hospital. -Infectious disease team to follow the patient. -Culture data has been sent -COVID-19-negative -MRI large right paracentral sacral decubitus ulcer extending into periosteal margin showed, worsening presacral fluid and concern of myositis. -On IV vancomycin and Zosyn -PICC line removed  Essential hypertension -Metoprolol on hold  Alzheimer's dementia -Continue Aricept via G-tube  P. Atrial fibrillation -Metoprolol on hold.  Continue IV Lopressor if becomes necessary.  Not on anticoagulation  DiabetesMellitus type II -Not on any medications.  Continue to monitor  History of CVA History of seizures -Keppra and Depakote.  History of neurosyphilis treated in 2014  Failure to thrive adult -Encourage nutrition.  Overall patient has poor prognosis given his comorbidities and extent of infection.  DVT prophylaxis: Lovenox Code Status: DNR Family Communication:  Attempted to reach family, no response.  Unable to leave voicemail. Disposition Plan: Maintain hospital stay for worsening of his osteomyelitis for IV antibiotics and further management  Subjective: Easily arousable.  Oriented to his name only.  But denies any complaints.  Review of Systems Otherwise negative except as per HPI, including: General: Denies fever, chills, night sweats or unintended weight loss. Resp: Denies cough, wheezing, shortness of breath. Cardiac: Denies chest pain, palpitations, orthopnea, paroxysmal nocturnal dyspnea. GI: Denies abdominal pain, nausea, vomiting, diarrhea or constipation GU: Denies dysuria, frequency, hesitancy or incontinence MS: Denies muscle aches, joint pain or swelling Neuro: Denies headache, neurologic deficits (focal weakness, numbness, tingling), abnormal gait Psych: Denies anxiety, depression, SI/HI/AVH Skin: Denies new rashes or lesions ID: Denies sick contacts, exotic exposures, travel  Objective: Vitals:   01/08/19 1000 01/08/19 1030 01/08/19 1100 01/08/19 1130  BP: (!) 157/93  (!) 145/105   Pulse:  87 87 92  Resp:  (!) 24 (!) 25 20  Temp:      TempSrc:      SpO2:  94% 92% 93%  Weight:      Height:        Intake/Output Summary (Last 24 hours) at 01/08/2019 1157 Last data filed at 01/08/2019 0926 Gross per 24 hour  Intake 350 ml  Output --  Net 350 ml   Filed Weights   01/07/19 1700  Weight: 74.3 kg    Examination:  General exam: Appears calm and comfortable.  Appears chronically ill Respiratory system: Diminished breath sounds at the bases Cardiovascular system: S1 & S2 heard, RRR. No JVD, murmurs, rubs, gallops or clicks. No pedal edema. Gastrointestinal system: Nontender nondistended.  G-tube noted Central nervous system: Alert to his name only. No focal neurological deficits. Extremities: Symmetric 3+ x  5 power. Skin: Stage IV sacral decubitus ulcer with surrounding erythema Psychiatry: Poor judgment and insight.   Alert to his name only  External catheter  Data Reviewed:   CBC: Recent Labs  Lab 01/07/19 1624 01/08/19 0049 01/08/19 0439  WBC 6.2 6.6 5.9  NEUTROABS 3.5  --   --   HGB 13.7 12.9* 13.0  HCT 45.0 40.6 41.6  MCV 98.5 96.0 96.7  PLT 250 223 228   Basic Metabolic Panel: Recent Labs  Lab 01/07/19 1624 01/08/19 0049 01/08/19 0439  NA 140  --  141  K 4.3  --  3.9  CL 103  --  106  CO2 27  --  25  GLUCOSE 108*  --  90  BUN 15  --  11  CREATININE 0.70 0.65 0.66  CALCIUM 9.2  --  8.8*   GFR: Estimated Creatinine Clearance: 82.6 mL/min (by C-G formula based on SCr of 0.66 mg/dL). Liver Function Tests: Recent Labs  Lab 01/07/19 1624 01/08/19 0439  AST 27 23  ALT 15 14  ALKPHOS 75 54  BILITOT 0.5 0.5  PROT 8.2* 7.1  ALBUMIN 2.8* 2.5*   No results for input(s): LIPASE, AMYLASE in the last 168 hours. No results for input(s): AMMONIA in the last 168 hours. Coagulation Profile: No results for input(s): INR, PROTIME in the last 168 hours. Cardiac Enzymes: No results for input(s): CKTOTAL, CKMB, CKMBINDEX, TROPONINI in the last 168 hours. BNP (last 3 results) No results for input(s): PROBNP in the last 8760 hours. HbA1C: No results for input(s): HGBA1C in the last 72 hours. CBG: No results for input(s): GLUCAP in the last 168 hours. Lipid Profile: No results for input(s): CHOL, HDL, LDLCALC, TRIG, CHOLHDL, LDLDIRECT in the last 72 hours. Thyroid Function Tests: No results for input(s): TSH, T4TOTAL, FREET4, T3FREE, THYROIDAB in the last 72 hours. Anemia Panel: No results for input(s): VITAMINB12, FOLATE, FERRITIN, TIBC, IRON, RETICCTPCT in the last 72 hours. Sepsis Labs: Recent Labs  Lab 01/07/19 1603  LATICACIDVEN 1.7    Recent Results (from the past 240 hour(s))  SARS CORONAVIRUS 2 (TAT 6-24 HRS) Nasopharyngeal Nasopharyngeal Swab     Status: None   Collection Time: 01/07/19  4:22 PM   Specimen: Nasopharyngeal Swab  Result Value Ref Range Status   SARS  Coronavirus 2 NEGATIVE NEGATIVE Final    Comment: (NOTE) SARS-CoV-2 target nucleic acids are NOT DETECTED. The SARS-CoV-2 RNA is generally detectable in upper and lower respiratory specimens during the acute phase of infection. Negative results do not preclude SARS-CoV-2 infection, do not rule out co-infections with other pathogens, and should not be used as the sole basis for treatment or other patient management decisions. Negative results must be combined with clinical observations, patient history, and epidemiological information. The expected result is Negative. Fact Sheet for Patients: HairSlick.no Fact Sheet for Healthcare Providers: quierodirigir.com This test is not yet approved or cleared by the [place] FDA and  has been authorized for detection and/or diagnosis of SARS-CoV-2 by FDA under an Emergency Use Authorization (EUA). This EUA will remain  in effect (meaning this test can be used) for the duration of the COVID-19 declaration under Section 56 4(b)(1) of the Act, 21 U.S.C. section 360bbb-3(b)(1), unless the authorization is terminated or revoked sooner. Performed at [facility], [address], [place], [place] [address]          Radiology Studies: Dg Pelvis Portable  Result Date: 01/07/2019 CLINICAL DATA:  Large sacral decubitus ulcer. EXAM:  PORTABLE PELVIS 1-2 VIEWS COMPARISON:  CT abdomen and pelvis from 11/22/2017 FINDINGS: Assessment limited by overlying bowel gas with respect to this bony sacrum. Periosteal reaction and bony destruction exhibited on recent CT and MRI not well evaluated. Rotation of the current exam mildly limits assessment. No additional acute bone process. IMPRESSION: Limited assessment in the area of concern in this patient with proven osteomyelitis of the sacrococcygeal region. No additional acute process. Electronically Signed   By: Donzetta KohutGeoffrey  Wile M.D.   On: 01/07/2019 17:06    Mr Sacrum Si Joints Wo Contrast  Result Date: 01/08/2019 CLINICAL DATA:  Pelvic osteomyelitis, decubitus ulcer. EXAM: MRI PELVIS WITHOUT CONTRAST TECHNIQUE: Multiplanar multisequence MR imaging of the pelvis was performed. No intravenous contrast was administered. Sacral protocol was utilized. COMPARISON:  Sacral MRI from 11/05/2018 and CT pelvis from 11/23/2018 FINDINGS: Urinary Tract:  Urinary bladder unremarkable where included. Bowel:  Unremarkable where included. Vascular/Lymphatic: Unremarkable where included. Reproductive:  Unremarkable where included. Other:  No supplemental non-categorized findings. Musculoskeletal: Large right paracentral sacral decubitus ulcer. The ulceration extends to the right periosteal margin in the vicinity of the sacrococcygeal junction. Along the deep margin there is worsened abnormal edema in the S5 sacral segment and in the coccyx, with new bony discontinuity at the sacrococcygeal junction compatible with bony destruction or fracture. Once again there is abnormal edema signal along the deep margins of the iliacus muscle, and in the gluteus maximus muscles, right greater than left, potentially from myositis. Worsened presacral fluid collection/edema, image 14/7. Increased fluid signal within and along the left obturator internus muscle, for example on images 20 through 24 of series 6, this area is not fully included is today's exam was more focused on the sacrum. IMPRESSION: 1. Large right paracentral sacral decubitus ulcer extending to the periosteal margin of the sacrum near the sacrococcygeal junction, with worsening osteomyelitis of S5 and of the coccyx and new bony discontinuity at the sacrococcygeal junction compatible with bony destruction or fracture. 2. Similar appearance of edema signal along the deep margins of the iliacus muscles and in the gluteus maximus muscles, myositis is not excluded. 3. Worsened presacral fluid collection/edema. 4. Increased fluid signal  within the left obturator internus muscle, favoring myositis. Electronically Signed   By: Gaylyn RongWalter  Liebkemann M.D.   On: 01/08/2019 07:35   Dg Chest Port 1 View  Result Date: 01/07/2019 CLINICAL DATA:  Tachycardic and fatigue. EXAM: PORTABLE CHEST 1 VIEW COMPARISON:  11/23/2018 FINDINGS: Increased interstitial prominence and areas of lucency in the right upper lobe. Cardiomediastinal contours are stable. Increasing retrocardiac opacification and vascular congestion. No acute bone process. IMPRESSION: Increasing interstitial prominence may reflect mild asymmetric edema or pneumonitis superimposed on emphysematous changes. No signs of dense consolidation or pleural effusion. Electronically Signed   By: Donzetta KohutGeoffrey  Wile M.D.   On: 01/07/2019 17:03        Scheduled Meds:  enoxaparin (LOVENOX) injection  40 mg Subcutaneous Q24H   Continuous Infusions:  sodium chloride 100 mL/hr at 01/08/19 0356   sodium chloride Stopped (01/07/19 2234)   piperacillin-tazobactam (ZOSYN)  IV Stopped (01/08/19 0540)   vancomycin Stopped (01/08/19 0926)     LOS: 1 day   Time spent= 40 mins    Jermar Colter Joline Maxcyhirag Symone Cornman, MD [facility]  If 7PM-7AM, please contact night-coverage  01/08/2019, 11:57 AM

## 2019-01-08 NOTE — Progress Notes (Signed)
Called pharmacy for vancomycin abx and diflucan. Waiting to receive to administered it.

## 2019-01-08 NOTE — Plan of Care (Signed)
Patient is receiving antibiotics for infection.

## 2019-01-08 NOTE — ED Notes (Signed)
Patient transported to MRI 

## 2019-01-08 NOTE — ED Notes (Signed)
Pt presents 88% on room air, placed on 2L nasal cannula. SpO2 increased to 95% with RR 24

## 2019-01-08 NOTE — ED Notes (Signed)
2 gram sodium diet breakfast ordered

## 2019-01-08 NOTE — ED Provider Notes (Signed)
4:02 AM  Received call from radiology regarding MRI results.  It appears that he likely has osteomyelitis of the coccyx and sacrum.  MRI final results pending final read tomorrow morning.  Patient appears to already be on antibiotics.  Results were shared with overnight hospitalist to recommended placing in chart but otherwise no change in care.   Merryl Hacker, MD 01/08/19 [phone]

## 2019-01-08 NOTE — Consult Note (Signed)
[facility]    Date of Admission:  01/07/2019     Total days of antibiotics   Vancomycin + Zosyn day 1  Received 42 days IV Vancomycin + Ceftriaxone + PO Metronidazole                Reason for Consult: Sacral osteomyelitis     Referring Provider: EDP  Primary Care Provider: [name], MD   Assessment: Joshua Gonzales is a 78 y.o. male with advanced Alzheimer's dementia (verbal, bed bound with contracted arms/legs) sent to the hospital for evaluation of fevers nearing the end of his empiric therapy for sacral osteomyelitis. He was originally hospitalized for this September 19-24 after wound worsened with vancomycin therapy alone.   Fever -  concern over possible PICC line infection given surrounding erythema noted yesterday at insertion site. His line has since been pulled.Site looks OK now with no purulence expressed and no residual erythema now.  Continue vancomycin while we await blood culture results. Unasyn for coverage of wound.   Chronic Sacral Decubitus -  Per the chart photo, his wound is large and deep with signs of soft tissue infection, ?myositis of surrounding musculature. Will narrow him a little to vancomycin + unasyn IV and follow clinically. He will need air mattress overlay and frequent turning for offloading. Would ask WOC team to see regarding dressing change recommendations; the wound appears to stay very wet and skin macerated with likely fungal rash component. ?if he would benefit from alginate packing and not wet to dry approach.  Will start fluconazole 100 mg QD VT as well. He has loose stools as noted in the photo as well and needs frequent cleaning to decrease stool contamination.   Prognosis- Overall this is a very unfortunate situation in a malnourished, chronically bed-bound demented individual with a large sacral wound. This is extremely unlikely to heal even with the best wound care and high risk for re-infection due  to multiple organisms including emergence of drug resistance. He had a possible transient bacteremia noted in September and now with concern again over PICC line infection which increases his risk for adverse event a/w intravascular line.   Will follow.     Plan: 1. Continue vancomycin  2. Change to unasyn for anaerobes/gnr's  3. Follow micro data  4. Follow fever curve 5. Consider WOC consult for wound care recs     Active Problems:   Type 2 diabetes mellitus without complication (HCC)   Sacral wound   HTN (hypertension)   Alzheimer's dementia (HCC)    donepezil  10 mg Per Tube QHS   enoxaparin (LOVENOX) injection  40 mg Subcutaneous Q24H   levETIRAcetam  750 mg Per Tube BID   valproic acid  125 mg Per Tube BID    HPI: Joshua Gonzales is a 78 y.o. male sent over to ER after being seen in ID clinic.   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 He finished 6 week course of vancomycin + ceftriaxone IV + metronidazole PO. He was reported to have had intermittent fevers at SNF per report given to Dr. Drue Gonzales yesterday. In the office he was tachycardic with temp 100.3 F and diaphoretic. PICC line is exposed with surrounding erythema - this was pulled in clinic and noted to have fibrinous debris at the distal tip. Wound was macerated and tunneled at a  vast majority of the perimeter with another stage 1 wound to upper back with non-blanching erythema.   He was sent to the ER from his SNF for evaluation of fever after Office visit - MRI was obtained and revealed concern for worsened osteomyelitis of S5 and coccys with new bony discontinuity of thesacrococcygeal junction compatible with bony destruction vs fracture. Edema of the iliac muscles and gluteus maximum muscles concerning for myositis. Worsened presacral fluid collection/edema. He has since been started on vancomycin + zosyn. He has been afebrile here so  far since arrival. WBC normal at 5.9K. Blood cultures drawn 11/03 no growth thus far.    In discussion with Joshua Gonzales he believes that the year is [id number] and he came from home living near his mother. He is uncertain where he is presently and remarks "looks like a hole." He states he is not in pain. He is uncertain about wound treatments at SNF and unaware of wound to sacrum. He says he felt poorly at home but today he feels much better.    Review of Systems: Review of Systems  Unable to perform ROS: Dementia    Past Medical History:  Diagnosis Date   Alzheimer disease (HCC)    Hypertension    Seizures (HCC)     Social History   Tobacco Use   Smoking status: Former Smoker  Substance Use Topics   Alcohol use: Yes   Drug use: Not on file    Family History  Problem Relation Age of Onset   Hyperlipidemia Mother    Hypertension Mother    Hypertension Father    Hyperlipidemia Father    No Known Allergies  OBJECTIVE: Blood pressure (!) 139/101, pulse 84, temperature 98.1 F (36.7 C), temperature source Rectal, resp. rate (!) 21, height 5\' 11"  (1.803 m), weight 74.3 kg, SpO2 92 %.   Physical Exam Vitals signs and nursing note reviewed.  HENT:     Mouth/Throat:     Mouth: Mucous membranes are moist.     Pharynx: Oropharynx is clear.  Eyes:     General: No scleral icterus. Cardiovascular:     Rate and Rhythm: Normal rate and regular rhythm.     Heart sounds: No murmur.  Pulmonary:     Effort: Pulmonary effort is normal.     Breath sounds: Normal breath sounds.  Abdominal:     General: Abdomen is flat. Bowel sounds are normal. There is no distension.     Tenderness: There is no abdominal tenderness.  Musculoskeletal: Normal range of motion.  Skin:    General: Skin is warm and dry.     Capillary Refill: Capillary refill takes less than 2 seconds.     Comments: R PICC line site removed - no purulence or fluctuance noted. No remaining erythema.  Unable to  directly visualize wound as there is no one available to help turn the patient. Media photos assessed and reveals large red surrounding skin and deep wound that is not well visualized enough to comment on the wound bed directly. Surrounding tissues appears to be quite macerated from moisture.   Neurological:     Mental Status: He is alert and oriented to person, place, and time.     Lab Results Lab Results  Component Value Date   WBC 5.9 01/08/2019   HGB 13.0 01/08/2019   HCT 41.6 01/08/2019   MCV 96.7 01/08/2019   PLT 228 01/08/2019    Lab Results  Component Value Date   CREATININE 0.66 01/08/2019  BUN 11 01/08/2019   NA 141 01/08/2019   K 3.9 01/08/2019   CL 106 01/08/2019   CO2 25 01/08/2019    Lab Results  Component Value Date   ALT 14 01/08/2019   AST 23 01/08/2019   ALKPHOS 54 01/08/2019   BILITOT 0.5 01/08/2019     Microbiology: Recent Results (from the past 240 hour(s))  Blood Culture (routine x 2)     Status: None (Preliminary result)   Collection Time: 01/07/19  4:15 PM   Specimen: BLOOD RIGHT FOREARM  Result Value Ref Range Status   Specimen Description BLOOD RIGHT FOREARM  Final   Special Requests   Final    BOTTLES DRAWN AEROBIC AND ANAEROBIC Blood Culture adequate volume   Culture   Final    NO GROWTH < 24 HOURS Performed at [facility], [place] [address], [place], [place] [address]    Report Status PENDING  Incomplete  SARS CORONAVIRUS 2 (TAT 6-24 HRS) Nasopharyngeal Nasopharyngeal Swab     Status: None   Collection Time: 01/07/19  4:22 PM   Specimen: Nasopharyngeal Swab  Result Value Ref Range Status   SARS Coronavirus 2 NEGATIVE NEGATIVE Final    Comment: (NOTE) SARS-CoV-2 target nucleic acids are NOT DETECTED. The SARS-CoV-2 RNA is generally detectable in upper and lower respiratory specimens during the acute phase of infection. Negative results do not preclude SARS-CoV-2 infection, do not rule out co-infections with other pathogens, and  should not be used as the sole basis for treatment or other patient management decisions. Negative results must be combined with clinical observations, patient history, and epidemiological information. The expected result is Negative. Fact Sheet for Patients: SugarRoll.be Fact Sheet for Healthcare Providers: https://www.woods-mathews.com/ This test is not yet approved or cleared by the [place] FDA and  has been authorized for detection and/or diagnosis of SARS-CoV-2 by FDA under an Emergency Use Authorization (EUA). This EUA will remain  in effect (meaning this test can be used) for the duration of the COVID-19 declaration under Section 56 4(b)(1) of the Act, 21 U.S.C. section 360bbb-3(b)(1), unless the authorization is terminated or revoked sooner. Performed at [facility], [place] [address], [place], [place] [address]   Blood Culture (routine x 2)     Status: None (Preliminary result)   Collection Time: 01/07/19  4:24 PM   Specimen: BLOOD RIGHT WRIST  Result Value Ref Range Status   Specimen Description BLOOD RIGHT WRIST  Final   Special Requests   Final    BOTTLES DRAWN AEROBIC AND ANAEROBIC Blood Culture adequate volume   Culture   Final    NO GROWTH < 24 HOURS Performed at [facility], [place] [address] [place], [place] [address]    Report Status PENDING  Incomplete    Janene Madeira, MSN, NP-C [facility] [email or web address] Pager: [phone] Office: [phone] [place]: [phone]

## 2019-01-08 NOTE — ED Notes (Signed)
2 gram sodium lunch tray ordered 

## 2019-01-08 NOTE — Plan of Care (Signed)

## 2019-01-08 NOTE — ED Notes (Signed)
Breakfast tray at bedside 

## 2019-01-09 ENCOUNTER — Encounter (HOSPITAL_COMMUNITY): Payer: Self-pay | Admitting: General Practice

## 2019-01-09 DIAGNOSIS — L89109 Pressure ulcer of unspecified part of back, unspecified stage: Secondary | ICD-10-CM

## 2019-01-09 DIAGNOSIS — M869 Osteomyelitis, unspecified: Secondary | ICD-10-CM

## 2019-01-09 DIAGNOSIS — Z95828 Presence of other vascular implants and grafts: Secondary | ICD-10-CM

## 2019-01-09 LAB — MRSA PCR SCREENING: MRSA by PCR: NEGATIVE

## 2019-01-09 LAB — GLUCOSE, CAPILLARY
Glucose-Capillary: 79 mg/dL (ref 70–99)
Glucose-Capillary: 106 mg/dL — ABNORMAL HIGH (ref 70–99)
Glucose-Capillary: 81 mg/dL (ref 70–99)
Glucose-Capillary: 105 mg/dL — ABNORMAL HIGH (ref 70–99)

## 2019-01-09 MED ORDER — ADULT MULTIVITAMIN LIQUID CH
15.0000 mL | Freq: Every day | ORAL | Status: DC
  Administered 2019-01-09 – 2019-01-14 (×6): 15 mL
  Filled 2019-01-09 (×6): qty 15

## 2019-01-09 MED ORDER — METOPROLOL TARTRATE 25 MG/10 ML ORAL SUSPENSION
12.5000 mg | Freq: Two times a day (BID) | ORAL | Status: DC
  Administered 2019-01-09 – 2019-01-14 (×11): 12.5 mg
  Filled 2019-01-09 (×12): qty 5

## 2019-01-09 MED ORDER — JEVITY 1.2 CAL PO LIQD: 1000.0000 mL | ORAL | Status: DC

## 2019-01-09 MED ORDER — SODIUM CHLORIDE 0.9 % IV SOLN
3.0000 g | Freq: Four times a day (QID) | INTRAVENOUS | Status: DC
  Administered 2019-01-09 – 2019-01-10 (×4): 3 g via INTRAVENOUS
  Filled 2019-01-09: qty 8
  Filled 2019-01-09 (×2): qty 3
  Filled 2019-01-09 (×2): qty 8
  Filled 2019-01-09: qty 3

## 2019-01-09 MED ORDER — OSMOLITE 1.5 CAL PO LIQD
1000.0000 mL | ORAL | Status: DC
  Administered 2019-01-09 – 2019-01-13 (×3): 1000 mL
  Filled 2019-01-09 (×7): qty 1000

## 2019-01-09 MED ORDER — METOPROLOL TARTRATE 12.5 MG HALF TABLET: 12.5000 mg | ORAL_TABLET | Freq: Two times a day (BID) | ORAL | Status: DC

## 2019-01-09 MED ORDER — ADULT MULTIVITAMIN W/MINERALS CH: 1.0000 | ORAL_TABLET | Freq: Every day | ORAL | Status: DC

## 2019-01-09 MED ORDER — INFLUENZA VAC A&B SA ADJ QUAD 0.5 ML IM PRSY
0.5000 mL | PREFILLED_SYRINGE | INTRAMUSCULAR | Status: AC
  Administered 2019-01-13: 0.5 mL via INTRAMUSCULAR
  Filled 2019-01-09: qty 0.5

## 2019-01-09 MED ORDER — PRO-STAT SUGAR FREE PO LIQD
30.0000 mL | Freq: Two times a day (BID) | ORAL | Status: DC
  Administered 2019-01-09 – 2019-01-14 (×9): 30 mL
  Filled 2019-01-09 (×9): qty 30

## 2019-01-09 NOTE — Progress Notes (Addendum)
Initial Nutrition Assessment  DOCUMENTATION CODES:   Not applicable  INTERVENTION:  Initiate Osmolite 1.5 at 14ml/hr via PEG  52mls Prostat BID (each supplement provides 100kcal and 15 grams protein)  Tube feeding regimen provides 2180 kilocalories and 112 grams of protein which meets 100% of his daily calorie needs and 100% of his daily protein needs.  Feeding assistance ordered  Multivitamin with minerals daily.  Downgraded diet to Dysphagia 2 with nectar thick liquids   NUTRITION DIAGNOSIS:   Increased nutrient needs related to wound healing as evidenced by estimated needs.   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 PMH includes dementia, HTN, CVA, DM type 2. Currently on 2g sodium diet.  Patient was in deep sleep when RD came to do assessment.  Per Nurse Tech, pt ate approximately 75% of his meal on 11/4 and 100% of his breakfast this morning. NT noted that feeding assistance would absolutely be required as pt has dementia and could not adequately feed himself. Per the [facility] at the SNF patient was on nectar thick liquids and receiving TF via PEG of resource 2.0 at an infusion rate of 15ml/hr and ProStat 2x/day. That provided 2120 kilocalories and 110 grams of protein. [facility] does not carry Resource 2.0 in the formulary so Osmolite 1.5 at an infusion rate of 44ml/hr was selected as a comparable substitution (high calorie, low fiber) to meet his needs.    Pt has a stage IV pressure injury in the sacral area that was cleaned and packed prior to RD assessment.  Pt has lost 12% body weight over the past 2 months which is significant for time frame.  Case discussed with MD who is agreeable  to restarting tube feeds.  Plan of care discussed with RN. Labs and Medications reviewed.    NUTRITION - FOCUSED PHYSICAL EXAM:    Most Recent Value  Orbital Region  No depletion  Upper Arm Region  No depletion  Thoracic and Lumbar Region  No depletion  Buccal Region  Mild depletion  Temple Region  Mild depletion  Clavicle Bone Region  Mild depletion  Clavicle and Acromion Bone Region  No depletion  Scapular Bone Region  No depletion  Dorsal Hand  Mild depletion  Patellar Region  No depletion  Anterior Thigh Region  No depletion  Posterior Calf Region  No depletion  Edema (RD Assessment)  None  Hair  Reviewed  Eyes  Unable to assess  Mouth  Unable to assess  Skin  Reviewed  Nails  Reviewed       Diet Order:   Diet Order            Diet 2 gram sodium Room service appropriate? Yes; Fluid consistency: Thin  Diet effective now              EDUCATION NEEDS:   Not appropriate for education at this time  Skin:  Skin Assessment: Skin Integrity Issues: Skin Integrity Issues:: Stage IV Stage IV: sacrum  Last BM:  01/09/19  Height:   Ht Readings from Last 1 Encounters:  01/08/19 5\' 11"  (1.803 m)    Weight:   Wt Readings from Last 1 Encounters:  01/09/19 70.2 kg  Ideal Body Weight:  78.2 kg  BMI:  Body mass index is 21.59 kg/m.  Estimated Nutritional Needs:   Kcal:  2100-2300 kcal  Protein:  105-120 grams  Fluid:  > 2 L    Berneta Levins, Dietetic Intern

## 2019-01-09 NOTE — Progress Notes (Signed)
Notified equipment of a need for a feeding pump for patient. Waiting on its arrival to administered feeding.

## 2019-01-09 NOTE — Progress Notes (Signed)
[facility]   Reason for visit: Follow up on osteomyelitis and decubitus ulcer  Interval History: continues on vancomycin and amp/sulbactam.  No acute events.  No rash.     Physical Exam: Constitutional:  Vitals:   01/09/19 0332 01/09/19 0722  BP: (!) 151/98 (!) 141/98  Pulse: 88 82  Resp: 18 18  Temp: 98.6 F (37 C) 99 F (37.2 C)  SpO2: 98% 93%   patient appears in NAD Respiratory: Normal respiratory effort; CTA B Cardiovascular: RRR Back: ulcer examined and similar to picture, no pus, does not probe to bone.   Skin: no rashes  Review of Systems: Unable to be assessed due to mental status  Lab Results  Component Value Date   WBC 5.9 01/08/2019   HGB 13.0 01/08/2019   HCT 41.6 01/08/2019   MCV 96.7 01/08/2019   PLT 228 01/08/2019    Lab Results  Component Value Date   CREATININE 0.66 01/08/2019   BUN 11 01/08/2019   NA 141 01/08/2019   K 3.9 01/08/2019   CL 106 01/08/2019   CO2 25 01/08/2019    Lab Results  Component Value Date   ALT 14 01/08/2019   AST 23 01/08/2019   ALKPHOS 54 01/08/2019     Microbiology: Recent Results (from the past 240 hour(s))  Blood Culture (routine x 2)     Status: None (Preliminary result)   Collection Time: 01/07/19  4:15 PM   Specimen: BLOOD RIGHT FOREARM  Result Value Ref Range Status   Specimen Description BLOOD RIGHT FOREARM  Final   Special Requests   Final    BOTTLES DRAWN AEROBIC AND ANAEROBIC Blood Culture adequate volume   Culture   Final    NO GROWTH < 24 HOURS Performed at [facility], [address] [place], [place] [address]    Report Status PENDING  Incomplete  SARS CORONAVIRUS 2 (TAT 6-24 HRS) Nasopharyngeal Nasopharyngeal Swab     Status: None   Collection Time: 01/07/19  4:22 PM   Specimen: Nasopharyngeal Swab  Result Value Ref Range Status   SARS Coronavirus 2 NEGATIVE NEGATIVE Final    Comment: (NOTE) SARS-CoV-2 target nucleic acids are NOT DETECTED. The  SARS-CoV-2 RNA is generally detectable in upper and lower respiratory specimens during the acute phase of infection. Negative results do not preclude SARS-CoV-2 infection, do not rule out co-infections with other pathogens, and should not be used as the sole basis for treatment or other patient management decisions. Negative results must be combined with clinical observations, patient history, and epidemiological information. The expected result is Negative. Fact Sheet for Patients: HairSlick.no Fact Sheet for Healthcare Providers: quierodirigir.com This test is not yet approved or cleared by the [place] FDA and  has been authorized for detection and/or diagnosis of SARS-CoV-2 by FDA under an Emergency Use Authorization (EUA). This EUA will remain  in effect (meaning this test can be used) for the duration of the COVID-19 declaration under Section 56 4(b)(1) of the Act, 21 U.S.C. section 360bbb-3(b)(1), unless the authorization is terminated or revoked sooner. Performed at [facility], [address], [place], [place] [address]   Blood Culture (routine x 2)     Status: None (Preliminary result)   Collection Time: 01/07/19  4:24 PM   Specimen: BLOOD RIGHT WRIST  Result Value Ref Range Status   Specimen Description BLOOD RIGHT WRIST  Final   Special Requests   Final    BOTTLES DRAWN  AEROBIC AND ANAEROBIC Blood Culture adequate volume   Culture   Final    NO GROWTH < 24 HOURS Performed at [facility], [place] [address] [place], [place] [address]    Report Status PENDING  Incomplete  Urine culture     Status: Abnormal (Preliminary result)   Collection Time: 01/07/19  7:10 PM   Specimen: In/Out Cath Urine  Result Value Ref Range Status   Specimen Description IN/OUT CATH URINE  Final   Special Requests   Final    NONE Performed at [facility], [place] [address] [place], [place] [address]    Culture  10,000 COLONIES/mL GRAM NEGATIVE RODS (A)  Final   Report Status PENDING  Incomplete  MRSA PCR Screening     Status: None   Collection Time: 01/09/19  4:55 AM   Specimen: Nasal Mucosa; Nasopharyngeal  Result Value Ref Range Status   MRSA by PCR NEGATIVE NEGATIVE Final    Comment:        The GeneXpert MRSA Assay (FDA approved for NASAL specimens only), is one component of a comprehensive MRSA colonization surveillance program. It is not intended to diagnose MRSA infection nor to guide or monitor treatment for MRSA infections. Performed at [facility], [place] [address] [place], [place] [address]     Impression/Plan:  1. Decubitus ulcer - continue wound care.  Has not been improving and likely due to patient being bed bound.  He needs to continue with wound care but without being ambulatory, I do not suspect this will completely heal.    2.  Osteomyelitis - he has been on prolonged IV empiric treatment for this and MRI noted with some worsening.  There is no way to know if this is just progression of the, now treated osteomyelitis or worsening.  His ulcer does not probe to bone so seems to be healing.  I do not feel prolonged IV antibiotics indicated at this time.   3.  picc line - some concerns of infection though no growth yet on blood cultures.  Will continue to monitor on vancomycin.

## 2019-01-09 NOTE — Consult Note (Signed)
[place] Nurse wound consult note  Reason for Consult:Chronic, nonhealing Stage 4 Pressure injury.Patient known to our service, last seen on 11/25/18. Wound type:Pressure Pressure Injury POA: Yes Measurement: To be entered onto Nursing Flow Sheet by Bedside RN today Wound bed: Red, moist Drainage (amount, consistency, odor) serous, small to moderate amount Periwound: erythematous, intact Dressing procedure/placement/frequency: I will provided the patient with a mattress replacement with low air loss feature and have give the Nursing staff guidance for reapportioning, head of bed elevation and for the flotation of heels. Topical care guidance for this wound with what appears to be worsening osteomyelitis (see note from IP MD Dr. Willene Hatchet dated 11/4) will be for twice daily dressing changes using a silver hydrofiber for absorption of exudate and donation of antimicrobial properties. As noted in the IP providers note yesterday, this wound is unlikely to heal without aggressive care up to an including surgery and a positive change in the patient's nutritional status.  [place] nursing team will not follow, but will remain available to this patient, the nursing and medical teams.  Please re-consult if needed. Thanks, Maudie Flakes, MSN, RN, [place], Arther Abbott  Pager# [phone]

## 2019-01-09 NOTE — Progress Notes (Signed)
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 Patient is a lot blood not oriented x3.  Refused to be examined. PEG tube in place without any erythema or leakage at site.  Patient is to restart tube feeding per dietary/nutrition.  Nutritional supplementation. Infectious disease following and managing antibiotics.   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 Patient was readmitted after discharge from hospital 6 weeks ago due to failed treatment for osteomyelitis. MRI showed worsening of infection/osteomyelitis with possible myositis. Blood culture showed no growth in 2 days Patient is currently on IV vancomycin,Unasyn and oral Flagyl via G-tube               Infectious disease following with antibiotics management.    Essential hypertension Resume metoprolol tartrate 25 mg twice daily   Dementia, Alzheimer type  Continue Aricept via G-tube  Paroxysmal Atrial fibrillation Resume metoprolol tartrate 25 mg twice daily. Not on anticoagulation                                                                                                                DiabetesMellitus type II Diet controlled. Continue fingersticks twice daily to monitor for potential hypoglycemic event.  History of CVA History of seizures Continue with Keppra and  Depakote.  History of neurosyphilis treated in 2014.  Stable.   No intervention at this time  Failure to thrive adult Resume tube feeding with dietary supplementation.  prognosis is guarded due to worsening sacral decubitus ulcer/osteomyelitis              .   DVT prophylaxis: Lovenox Code Status: DNR Family Communication: None                                              Disposition: More than 2 nights.   Subjective: Patient was evaluated at bedside. Not in acute distress.  Is alert but not oriented only to name.  Patient has some aggressive behavior.   Review of Systems General: No reported fever, chills or night sweats.      Resp: No cough, wheezing or shortness of breath.   Cardiac: No chest pain, palpitations, orthopnea, paroxysmal nocturnal dyspnea. GI: No documented abdominal pain, nausea, vomiting, diarrhea or constipation GU:No dysuria, frequency, hesitancy or incontinence MS: Denies muscle aches, joint pain or swelling Neuro: Denies headache, neurologic deficits (focal weakness, numbness, tingling), abnormal gait Psych: Unable to assess.  Skin: No rashes or lesions. PEG tube in place.   Objective: Vitals:   01/09/19 0332 01/09/19 0722 01/09/19 0900 01/09/19 1205  BP: (!) 151/98 (!) 141/98  (!) 148/104  Pulse: 88 82  86  Resp: Temp: 98.6 F (37 C) 99 F (37.2 C)  98 F (36.7 C)  TempSrc: Oral Oral    SpO2: 98% 93%    Weight:   70.2 kg   Height:        Intake/Output Summary (Last 24 hours) at 01/09/2019 1409 Last data filed at 01/09/2019 0804 Gross per 24 hour  Intake 1446.87 ml  Output 826 ml  Net 620.87 ml   Filed Weights   01/08/19 1556 01/08/19 2354 01/09/19 0900  Weight: 72.4 kg 69.2 kg 70.2 kg    Examination:   General exam: Appears calm and comfortable.   Appears chronically ill Respiratory system: Decreased bibasilar breath sounds.  No rhonchi, rales . Cardiovascular system: S1 & S2 heard, RRR. No JVD, murmurs, rubs, gallops or clicks. No pedal edema. Gastrointestinal system: Nontender nondistended.  G-tube noted Central nervous system: Alert to his name only.  Bilateral upper extremity contractures.  Fixed flexion contractures. . Extremities: Bilateral upper extremity contractures .Marland Kitchen Skin: Stage IV sacral decubitus ulcer with surrounding erythema Psychiatry: Aggressive response to questions.  Dementia with significant cognitive impairment.   External catheter  Data Reviewed:   CBC: Recent Labs  Lab 01/07/19 1624 01/08/19 0049 01/08/19 0439  WBC 6.2 6.6 5.9  NEUTROABS 3.5  --   --   HGB 13.7 12.9* 13.0  HCT 45.0 40.6 41.6  MCV 98.5 96.0 96.7  PLT 250 223 228   Basic Metabolic Panel: Recent Labs  Lab 01/07/19 1624 01/08/19 0049 01/08/19 0439  NA 140  --  141  K 4.3  --  3.9  CL 103  --  106  CO2 27  --  25  GLUCOSE 108*  --  90  BUN 15  --  11  CREATININE 0.70 0.65 0.66  CALCIUM 9.2  --  8.8*   GFR: Estimated Creatinine Clearance: 78 mL/min (by C-G formula based on SCr of 0.66 mg/dL). Liver Function Tests: Recent Labs  Lab 01/07/19 1624 01/08/19 0439  AST 27 23  ALT 15 14  ALKPHOS 75 54  BILITOT 0.5 0.5  PROT 8.2* 7.1  ALBUMIN 2.8* 2.5*   No results  for input(s): LIPASE, AMYLASE in the last 168 hours. No results for input(s): AMMONIA in the last 168 hours. Coagulation Profile: No results for input(s): INR, PROTIME in the last 168 hours. Cardiac Enzymes: No results for input(s): CKTOTAL, CKMB, CKMBINDEX, TROPONINI in the last 168 hours. BNP (last 3 results) No results for input(s): PROBNP in the last 8760 hours. HbA1C: No results for input(s): HGBA1C in the last 72 hours. CBG: Recent Labs  Lab 01/09/19 0555 01/09/19 1142  GLUCAP 79 106*   Lipid  Profile: No results for input(s): CHOL, HDL, LDLCALC, TRIG, CHOLHDL, LDLDIRECT in the last 72 hours. Thyroid Function Tests: No results for input(s): TSH, T4TOTAL, FREET4, T3FREE, THYROIDAB in the last 72 hours. Anemia Panel: No results for input(s): VITAMINB12, FOLATE, FERRITIN, TIBC, IRON, RETICCTPCT in the last 72 hours. Sepsis Labs: Recent Labs  Lab 01/07/19 1603  LATICACIDVEN 1.7    Recent Results (from the past 240 hour(s))  Blood Culture (routine x 2)     Status: None (Preliminary result)   Collection Time: 01/07/19  4:15 PM   Specimen: BLOOD RIGHT FOREARM  Result Value Ref Range Status   Specimen Description BLOOD RIGHT FOREARM  Final   Special Requests   Final    BOTTLES DRAWN AEROBIC AND ANAEROBIC Blood Culture adequate volume   Culture   Final    NO GROWTH 2 DAYS Performed at [facility], [address], [place], [place] [address]    Report Status PENDING  Incomplete  SARS CORONAVIRUS 2 (TAT 6-24 HRS) Nasopharyngeal Nasopharyngeal Swab     Status: None   Collection Time: 01/07/19  4:22 PM   Specimen: Nasopharyngeal Swab  Result Value Ref Range Status   SARS Coronavirus 2 NEGATIVE NEGATIVE Final    Comment: (NOTE) SARS-CoV-2 target nucleic acids are NOT DETECTED. The SARS-CoV-2 RNA is generally detectable in upper and lower respiratory specimens during the acute phase of infection. Negative results do not preclude SARS-CoV-2 infection, do not rule out co-infections with other pathogens, and should not be used as the sole basis for treatment or other patient management decisions. Negative results must be combined with clinical observations, patient history, and epidemiological information. The expected result is Negative. Fact Sheet for Patients: HairSlick.no Fact Sheet for Healthcare Providers: quierodirigir.com This test is not yet approved or cleared by the [place] FDA and  has been authorized  for detection and/or diagnosis of SARS-CoV-2 by FDA under an Emergency Use Authorization (EUA). This EUA will remain  in effect (meaning this test can be used) for the duration of the COVID-19 declaration under Section 56 4(b)(1) of the Act, 21 U.S.C. section 360bbb-3(b)(1), unless the authorization is terminated or revoked sooner. Performed at [facility], [address] [place], [place] [address]   Blood Culture (routine x 2)     Status: None (Preliminary result)   Collection Time: 01/07/19  4:24 PM   Specimen: BLOOD RIGHT WRIST  Result Value Ref Range Status   Specimen Description BLOOD RIGHT WRIST  Final   Special Requests   Final    BOTTLES DRAWN AEROBIC AND ANAEROBIC Blood Culture adequate volume   Culture   Final    NO GROWTH 2 DAYS Performed at [facility], [address] [place], [place] [address]    Report Status PENDING  Incomplete  Urine culture     Status: Abnormal (Preliminary result)   Collection Time: 01/07/19  7:10 PM   Specimen: In/Out Cath Urine  Result Value Ref  Range Status   Specimen Description IN/OUT CATH URINE  Final   Special Requests NONE  Final   Culture (A)  Final    PSEUDOMONAS AERUGINOSA 2,000 COLONIES/mL GRAM NEGATIVE RODS IDENTIFICATION AND SUSCEPTIBILITIES TO FOLLOW Performed at [facility], [address] [place], [place] [address]    Report Status PENDING  Incomplete  MRSA PCR Screening     Status: None   Collection Time: 01/09/19  4:55 AM   Specimen: Nasal Mucosa; Nasopharyngeal  Result Value Ref Range Status   MRSA by PCR NEGATIVE NEGATIVE Final    Comment:        The GeneXpert MRSA Assay (FDA approved for NASAL specimens only), is one component of a comprehensive MRSA colonization surveillance program. It is not intended to diagnose MRSA infection nor to guide or monitor treatment for MRSA infections. Performed at [facility], [address] [place], [place] [address]          Radiology  Studies: Dg Pelvis Portable  Result Date: 01/07/2019 CLINICAL DATA:  Large sacral decubitus ulcer. EXAM: PORTABLE PELVIS 1-2 VIEWS COMPARISON:  CT abdomen and pelvis from 11/22/2017 FINDINGS: Assessment limited by overlying bowel gas with respect to this bony sacrum. Periosteal reaction and bony destruction exhibited on recent CT and MRI not well evaluated. Rotation of the current exam mildly limits assessment. No additional acute bone process. IMPRESSION: Limited assessment in the area of concern in this patient with proven osteomyelitis of the sacrococcygeal region. No additional acute process. Electronically Signed   By: Donzetta KohutGeoffrey  Wile M.D.   On: 01/07/2019 17:06   Mr Sacrum Si Joints Wo Contrast  Result Date: 01/08/2019 CLINICAL DATA:  Pelvic osteomyelitis, decubitus ulcer. EXAM: MRI PELVIS WITHOUT CONTRAST TECHNIQUE: Multiplanar multisequence MR imaging of the pelvis was performed. No intravenous contrast was administered. Sacral protocol was utilized. COMPARISON:  Sacral MRI from 11/05/2018 and CT pelvis from 11/23/2018 FINDINGS: Urinary Tract:  Urinary bladder unremarkable where included. Bowel:  Unremarkable where included. Vascular/Lymphatic: Unremarkable where included. Reproductive:  Unremarkable where included. Other:  No supplemental non-categorized findings. Musculoskeletal: Large right paracentral sacral decubitus ulcer. The ulceration extends to the right periosteal margin in the vicinity of the sacrococcygeal junction. Along the deep margin there is worsened abnormal edema in the S5 sacral segment and in the coccyx, with new bony discontinuity at the sacrococcygeal junction compatible with bony destruction or fracture. Once again there is abnormal edema signal along the deep margins of the iliacus muscle, and in the gluteus maximus muscles, right greater than left, potentially from myositis. Worsened presacral fluid collection/edema, image 14/7. Increased fluid signal within and along the  left obturator internus muscle, for example on images 20 through 24 of series 6, this area is not fully included is today's exam was more focused on the sacrum. IMPRESSION: 1. Large right paracentral sacral decubitus ulcer extending to the periosteal margin of the sacrum near the sacrococcygeal junction, with worsening osteomyelitis of S5 and of the coccyx and new bony discontinuity at the sacrococcygeal junction compatible with bony destruction or fracture. 2. Similar appearance of edema signal along the deep margins of the iliacus muscles and in the gluteus maximus muscles, myositis is not excluded. 3. Worsened presacral fluid collection/edema. 4. Increased fluid signal within the left obturator internus muscle, favoring myositis. Electronically Signed   By: Gaylyn RongWalter  Liebkemann M.D.   On: 01/08/2019 07:35   Dg Chest Port 1 View  Result Date: 01/07/2019 CLINICAL DATA:  Tachycardic and fatigue. EXAM: PORTABLE CHEST  1 VIEW COMPARISON:  11/23/2018 FINDINGS: Increased interstitial prominence and areas of lucency in the right upper lobe. Cardiomediastinal contours are stable. Increasing retrocardiac opacification and vascular congestion. No acute bone process. IMPRESSION: Increasing interstitial prominence may reflect mild asymmetric edema or pneumonitis superimposed on emphysematous changes. No signs of dense consolidation or pleural effusion. Electronically Signed   By: Donzetta Kohut M.D.   On: 01/07/2019 17:03        Scheduled Meds: . donepezil  10 mg Per Tube QHS  . enoxaparin (LOVENOX) injection  40 mg Subcutaneous Q24H  . fluconazole  100 mg Oral Daily  . [START ON 01/10/2019] influenza vaccine adjuvanted  0.5 mL Intramuscular Tomorrow-1000  . levETIRAcetam  750 mg Per Tube BID  . valproic acid  125 mg Per Tube BID   Continuous Infusions: . sodium chloride 100 mL/hr at 01/08/19 0356  . sodium chloride 125 mL/hr at 01/09/19 0506  . ampicillin-sulbactam (UNASYN) IV 3 g (01/09/19 1350)  .  vancomycin 750 mg (01/09/19 0940)     LOS: 2 days   Time spent= 40 mins    Aquilla Hacker, MD [facility] Pager [phone]  If 7PM-7AM, please contact night-coverage  01/09/2019, 2:09 PM

## 2019-01-09 NOTE — Plan of Care (Signed)

## 2019-01-10 ENCOUNTER — Encounter (HOSPITAL_COMMUNITY): Payer: Self-pay | Admitting: Surgery

## 2019-01-10 ENCOUNTER — Encounter (HOSPITAL_BASED_OUTPATIENT_CLINIC_OR_DEPARTMENT_OTHER): Payer: Medicare Other | Admitting: Internal Medicine

## 2019-01-10 LAB — BASIC METABOLIC PANEL
Anion gap: 8 (ref 5–15)
BUN: 6 mg/dL — ABNORMAL LOW (ref 8–23)
CO2: 25 mmol/L (ref 22–32)
Calcium: 8.9 mg/dL (ref 8.9–10.3)
Chloride: 107 mmol/L (ref 98–111)
Creatinine, Ser: 0.71 mg/dL (ref 0.61–1.24)
GFR calc Af Amer: >60 mL/min (ref >60)
GFR calc non Af Amer: >60 mL/min (ref >60)
Glucose, Bld: 126 mg/dL — ABNORMAL HIGH (ref 70–99)
Potassium: 4.1 mmol/L (ref 3.5–5.1)
Sodium: 140 mmol/L (ref 135–145)

## 2019-01-10 LAB — GLUCOSE, CAPILLARY
Glucose-Capillary: 117 mg/dL — ABNORMAL HIGH (ref 70–99)
Glucose-Capillary: 126 mg/dL — ABNORMAL HIGH (ref 70–99)
Glucose-Capillary: 138 mg/dL — ABNORMAL HIGH (ref 70–99)
Glucose-Capillary: 143 mg/dL — ABNORMAL HIGH (ref 70–99)
Glucose-Capillary: 145 mg/dL — ABNORMAL HIGH (ref 70–99)
Glucose-Capillary: 148 mg/dL — ABNORMAL HIGH (ref 70–99)

## 2019-01-10 MED ORDER — AMOXICILLIN-POT CLAVULANATE 875-125 MG PO TABS
1.0000 | ORAL_TABLET | Freq: Two times a day (BID) | ORAL | Status: DC
  Administered 2019-01-10 – 2019-01-14 (×9): 1 via ORAL
  Filled 2019-01-10 (×9): qty 1

## 2019-01-10 NOTE — Progress Notes (Signed)
Pharmacy Antibiotic Note  Joshua Gonzales is a 78 y.o. male admitted on 01/07/2019 for fever. Patient has been receiving 6 weeks of vancomycin, ceftriaxone, and metronidazole for sacral osteomyelitis (ended 01/07/2019). During office visit with ID 11/3, patient was tachycardic with fever. PICC line was exposed and erythematous and then removed.  Pharmacy has been consulted for Unasyn and vancomycin dosing for osteomyelitis/decubitus ulcer. Afebrile, WBC WNL. SCr 0.71 stable, good UOP.  Of note, patient is bedbound at baseline. Outpatient vancomycin dose was 750mg  IV q12h. Will check VT today.    Plan: Continue vancomycin 750mg  IV q12h Unasyn 3g IV q6h Monitor clinical progress, c/s, renal function F/u de-escalation plan/LOT, vancomycin trough at steady state prior to next dose   Height: 5\' 11"  (180.3 cm) Weight: 158 lb 1.1 oz (71.7 kg) IBW/kg (Calculated) : 75.3  Temp (24hrs), Avg:98.8 F (37.1 C), Min:98 F (36.7 C), Max:99.7 F (37.6 C)  Recent Labs  Lab 01/07/19 1603 01/07/19 1624 01/08/19 0049 01/08/19 0439 01/10/19 0533  WBC  --  6.2 6.6 5.9  --   CREATININE  --  0.70 0.65 0.66 0.71  LATICACIDVEN 1.7  --   --   --   --     Estimated Creatinine Clearance: 79.7 mL/min (by C-G formula based on SCr of 0.71 mg/dL).    No Known Allergies  Antimicrobials this admission: Vanc 11/3 >> Zosyn 11/3 >>11/5 Unasyn 11/5 >> Fluconazole PO 11/4 >>  Dose adjustments this admission: N/A  Microbiology results: 11/3 Bcx: ngtd 11/3 Ucx: 10k col pseudomonas   Elicia Lamp, PharmD, BCPS Please check AMION for all [place] contact numbers Clinical Pharmacist 01/10/2019 8:44 AM

## 2019-01-10 NOTE — Care Management Important Message (Signed)
Important Message  Patient Details  Name: Joshua Gonzales MRN: [id number] Date of Birth: [birth year]   Medicare Important Message Given:  Yes     Shelda Altes 01/10/2019, 2:12 PM

## 2019-01-10 NOTE — Progress Notes (Signed)
    [place] for Infectious Disease   Reason for visit: Follow up on osteomyelitis and decubitus ulcer  Interval History: continues on vancomycin and amp/sulbactam.  No acute events.  No rash.    Microbiology:  Impression/Plan:  1. Decubitus ulcer - long term out look isn't favorable for healing given immobility, poor nutrition and wound care needs. Stop IV antibiotics and transition to Augmentin VT x 4 weeks for soft tissue wound/myositis to treat this likely polymicrobial process.   2.  Osteomyelitis - he has been on prolonged IV empiric treatment for this and MRI noted with some worsening.  There is no way to know if this is just progression of the, now treated osteomyelitis or worsening.  His ulcer does not probe to bone so seems to be healing.  I do not feel prolonged IV antibiotics indicated at this time. See #1 for plan   3. Cutaneous Candidiasis - continue Diflucan for 7 days total.   3.  picc line - previously with concerns for infection x 2; not a great candidate in the future in light of this with concern for causing more harm than good.   Will sign off - please call with questions.      Janene Madeira, MSN, NP-C [facility] [email or web address] Pager: [phone] Office: [phone] [place]: [phone]

## 2019-01-10 NOTE — Progress Notes (Signed)
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 Patient is laying calmly in bed. Not in acute distress. Alert and only oriented x1. Marland Kitchen  PEG tube in place without any erythema or leakage at site.  Patient is to restart tube feeding per dietary/nutrition. Reported increased residual of 200 cc. Will decrease tube feeding to 45 cc/hr. Infectious disease following and managing antibiotics.   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 Patient was readmitted after discharge from hospital 6 weeks ago due to failed treatment for osteomyelitis. MRI showed worsening of infection/osteomyelitis with possible myositis. Blood culture showed no growth in 2 days Patient is currently on IV vancomycin,Unasyn and oral Flagyl via G-tube               Infectious disease following with antibiotics management.    Essential hypertension Resume metoprolol tartrate 25 mg twice daily   Dementia, Alzheimer type  Continue Aricept via G-tube  Paroxysmal Atrial fibrillation Continue with metoprolol tartrate 25 mg twice daily. Not on anticoagulation                                                                                                                DiabetesMellitus type II Diet controlled. Continue fingersticks twice daily to monitor for potential hypoglycemic event.   History of CVA History of seizures Continue with Keppra and Depakote.  History of neurosyphilis treated in 2014.  Stable.   No intervention at this time  Failure to thrive adult. Tube feeding with increased residual of 200 cc Decrease tube feeding to 45 cc/h and monitor residual. .  Prognosis is guarded due to worsening sacral decubitus ulcer/osteomyelitis              .   DVT prophylaxis: Lovenox Code Status: DNR Family Communication: None                                              Disposition: More than 2 nights.   Subjective: Patient was evaluated at bedside. Not in acute distress.  He is alert but oriented x1.  He is calm and lying quietly in bed.  Able to answer questions.  Significant cognitive impairment.  Review of Systems General: No reported fever, chills or night sweats.      Resp: No cough, wheezing or shortness of breath.   Cardiac: No chest pain, palpitations, orthopnea, paroxysmal nocturnal dyspnea. GI: No documented abdominal pain, nausea, vomiting, diarrhea or constipation GU:No dysuria, frequency, hesitancy or incontinence MS: Denies muscle aches, joint pain or swelling Neuro: Denies headache, neurologic deficits (focal weakness, numbness, tingling), abnormal gait Psych: Unable to assess.  Skin: No rashes or lesions. PEG tube in place.   Objective: Vitals:   01/10/19 0111 01/10/19 0135 01/10/19 0608 01/10/19 0738  BP: (!) 151/102  (!) 149/97 (!) 160/109  Pulse: 86  95 87  Resp: (!) 23  (!) 21 (!) 24  Temp: 98.3 F (36.8 C)  99.2 F (37.3 C) 99.7 F (37.6 C)  TempSrc: Oral  Oral Oral  SpO2: 94%  92% 93%  Weight:  71.7 kg    Height:        Intake/Output Summary (Last 24 hours) at 01/10/2019 1110 Last data filed at 01/10/2019  0810 Gross per 24 hour  Intake 3582.23 ml  Output 1700 ml  Net 1882.23 ml   Filed Weights   01/08/19 2354 01/09/19 0900 01/10/19 0135  Weight: 69.2 kg 70.2 kg 71.7 kg    Examination:  General exam: Appears calm and comfortable.   Appears chronically ill Respiratory system: Decreased bibasilar breath sounds.  No rhonchi, rales . Cardiovascular system: S1 & S2 heard, RRR. No JVD, murmurs, rubs, gallops or clicks. No pedal edema. Gastrointestinal system: Nontender nondistended.  G-tube noted Central nervous system: Alert to his name only.  Bilateral upper extremity contractures.  Fixed flexion contractures. . Extremities: Bilateral upper extremity contractures .Marland Kitchen Skin: Stage IV sacral decubitus ulcer with surrounding erythema Psychiatry: Aggressive response to questions.  Dementia with significant cognitive impairment.   External catheter  Data Reviewed:   CBC: Recent Labs  Lab 01/07/19 1624 01/08/19 0049 01/08/19 0439  WBC 6.2 6.6 5.9  NEUTROABS 3.5  --   --   HGB 13.7 12.9* 13.0  HCT 45.0 40.6 41.6  MCV 98.5 96.0 96.7  PLT 250 223 696   Basic Metabolic Panel: Recent Labs  Lab 01/07/19 1624 01/08/19 0049 01/08/19 0439 01/10/19 0533  NA 140  --  141 140  K 4.3  --  3.9 4.1  CL 103  --  106 107  CO2 27  --  25 25  GLUCOSE 108*  --  90 126*  BUN 15  --  11 6*  CREATININE 0.70 0.65 0.66 0.71  CALCIUM 9.2  --  8.8* 8.9   GFR: Estimated Creatinine Clearance: 79.7 mL/min (by C-G formula based on SCr of 0.71 mg/dL). Liver Function Tests: Recent Labs  Lab 01/07/19 1624 01/08/19 0439  AST 27 23  ALT  15 14  ALKPHOS 75 54  BILITOT 0.5 0.5  PROT 8.2* 7.1  ALBUMIN 2.8* 2.5*   No results for input(s): LIPASE, AMYLASE in the last 168 hours. No results for input(s): AMMONIA in the last 168 hours. Coagulation Profile: No results for input(s): INR, PROTIME in the last 168 hours. Cardiac Enzymes: No results for input(s): CKTOTAL, CKMB, CKMBINDEX, TROPONINI in the  last 168 hours. BNP (last 3 results) No results for input(s): PROBNP in the last 8760 hours. HbA1C: No results for input(s): HGBA1C in the last 72 hours. CBG: Recent Labs  Lab 01/09/19 1657 01/09/19 2108 01/10/19 0001 01/10/19 0502 01/10/19 0734  GLUCAP 81 105* 148* 117* 145*   Lipid Profile: No results for input(s): CHOL, HDL, LDLCALC, TRIG, CHOLHDL, LDLDIRECT in the last 72 hours. Thyroid Function Tests: No results for input(s): TSH, T4TOTAL, FREET4, T3FREE, THYROIDAB in the last 72 hours. Anemia Panel: No results for input(s): VITAMINB12, FOLATE, FERRITIN, TIBC, IRON, RETICCTPCT in the last 72 hours. Sepsis Labs: Recent Labs  Lab 01/07/19 1603  LATICACIDVEN 1.7    Recent Results (from the past 240 hour(s))  Blood Culture (routine x 2)     Status: None (Preliminary result)   Collection Time: 01/07/19  4:15 PM   Specimen: BLOOD RIGHT FOREARM  Result Value Ref Range Status   Specimen Description BLOOD RIGHT FOREARM  Final   Special Requests   Final    BOTTLES DRAWN AEROBIC AND ANAEROBIC Blood Culture adequate volume   Culture   Final    NO GROWTH 2 DAYS Performed at [facility], [address] [place], [place] [address]    Report Status PENDING  Incomplete  SARS CORONAVIRUS 2 (TAT 6-24 HRS) Nasopharyngeal Nasopharyngeal Swab     Status: None   Collection Time: 01/07/19  4:22 PM   Specimen: Nasopharyngeal Swab  Result Value Ref Range Status   SARS Coronavirus 2 NEGATIVE NEGATIVE Final    Comment: (NOTE) SARS-CoV-2 target nucleic acids are NOT DETECTED. The SARS-CoV-2 RNA is generally detectable in upper and lower respiratory specimens during the acute phase of infection. Negative results do not preclude SARS-CoV-2 infection, do not rule out co-infections with other pathogens, and should not be used as the sole basis for treatment or other patient management decisions. Negative results must be combined with clinical observations, patient history, and  epidemiological information. The expected result is Negative. Fact Sheet for Patients: HairSlick.no Fact Sheet for Healthcare Providers: quierodirigir.com This test is not yet approved or cleared by the [place] FDA and  has been authorized for detection and/or diagnosis of SARS-CoV-2 by FDA under an Emergency Use Authorization (EUA). This EUA will remain  in effect (meaning this test can be used) for the duration of the COVID-19 declaration under Section 56 4(b)(1) of the Act, 21 U.S.C. section 360bbb-3(b)(1), unless the authorization is terminated or revoked sooner. Performed at [facility], [address] [place], [place] [address]   Blood Culture (routine x 2)     Status: None (Preliminary result)   Collection Time: 01/07/19  4:24 PM   Specimen: BLOOD RIGHT WRIST  Result Value Ref Range Status   Specimen Description BLOOD RIGHT WRIST  Final   Special Requests   Final    BOTTLES DRAWN AEROBIC AND ANAEROBIC Blood Culture adequate volume   Culture   Final    NO GROWTH 2 DAYS Performed at [facility], [address] [place], [place] [address]    Report Status PENDING  Incomplete  Urine culture     Status: Abnormal (Preliminary result)   Collection Time: 01/07/19  7:10 PM   Specimen: In/Out Cath Urine  Result Value Ref Range Status   Specimen Description IN/OUT CATH URINE  Final   Special Requests NONE  Final   Culture (A)  Final    PSEUDOMONAS AERUGINOSA 2,000 COLONIES/mL GRAM NEGATIVE RODS IDENTIFICATION AND SUSCEPTIBILITIES TO FOLLOW CULTURE REINCUBATED FOR BETTER GROWTH Performed at [facility], [address] [place], [place] [address]    Report Status PENDING  Incomplete  MRSA PCR Screening     Status: None   Collection Time: 01/09/19  4:55 AM   Specimen: Nasal Mucosa; Nasopharyngeal  Result Value Ref Range Status   MRSA by PCR NEGATIVE NEGATIVE Final    Comment:        The  GeneXpert MRSA Assay (FDA approved for NASAL specimens only), is one component of a comprehensive MRSA colonization surveillance program. It is not intended to diagnose MRSA infection nor to guide or monitor treatment for MRSA infections. Performed at [facility], [address] [place], [place] [address]          Radiology Studies: No results found.      Scheduled Meds: . donepezil  10 mg Per Tube QHS  . enoxaparin (LOVENOX) injection  40 mg Subcutaneous Q24H  . feeding supplement (PRO-STAT SUGAR FREE 64)  30 mL Per Tube BID  . fluconazole  100 mg Oral Daily  . influenza vaccine adjuvanted  0.5 mL Intramuscular Tomorrow-1000  . levETIRAcetam  750 mg Per Tube BID  . metoprolol tartrate  12.5 mg Per Tube BID  . multivitamin  15 mL Per Tube Daily  . valproic acid  125 mg Per Tube BID   Continuous Infusions: . sodium chloride 125 mL/hr at 01/10/19 0849  . ampicillin-sulbactam (UNASYN) IV 3 g (01/10/19 0850)  . feeding supplement (OSMOLITE 1.5 CAL) 1,000 mL (01/09/19 2057)  . vancomycin 750 mg (01/10/19 0559)     LOS: 3 days   Time spent= 40 mins    Aquilla HackerFelix O Ajuonuma, MD [facility] Pager [phone]  If 7PM-7AM, please contact night-coverage  01/10/2019, 11:10 AM

## 2019-01-10 NOTE — NC FL2 (Signed)
[place] LEVEL OF CARE SCREENING TOOL     IDENTIFICATION  Patient Name: Joshua Gonzales Birthdate: 1940-10-25 Sex: male Admission Date (Current Location): 01/07/2019  [place] and [place] Number:  Kathleen Argue [id number] [place] and Address:  [facility], [place], [place] [address], [place], [place] [address]      Provider Number: [id number]  Attending Physician Name and Address:  Elie Confer, MD  Relative Name and Phone Number:  Raynald Kemp, [phone]    Current Level of Care: Hospital Recommended Level of Care: [place] Prior Approval Number:    Date Approved/Denied:   PASRR Number: [id number]  Discharge Plan: SNF    Current Diagnoses: Patient Active Problem List   Diagnosis Date Noted  . Sacral wound 01/07/2019  . HTN (hypertension) 01/07/2019  . Alzheimer's dementia ([place]) 01/07/2019  . Osteomyelitis of vertebra, sacral and sacrococcygeal region ([place])   . Pressure injury of skin 11/24/2018  . Acute metabolic encephalopathy 84/13/2440  . Cellulitis 11/23/2018  . Osteomyelitis ([place]) 11/23/2018  . Constipation 11/23/2018  . Goals of care, counseling/discussion   . Palliative care by specialist   . FTT (failure to thrive) in adult   . Type 2 diabetes mellitus without complication ([place]) 12/31/2534  . History of CVA with residual deficit 08/16/2018  . Hyperammonemia ([place]) 08/16/2018  . Bradycardia 08/16/2018  . Lactic acid increased   . Hypotension 06/03/2014  . Near syncope 06/03/2014  . AKI (acute kidney injury) ([place]) 06/03/2014  . 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 Dementia with behavioral disturbance ([place])   . PAF (paroxysmal atrial fibrillation) ([place])   . History of seizures 05/15/2014  . Essential hypertension, benign 01/24/2013  . Unspecified late effects of cerebrovascular disease 01/24/2013  . Acute encephalopathy 11/26/2012  . Hypokalemia 11/26/2012  .  Alcoholism in remission ([place]) 11/26/2012    Orientation RESPIRATION BLADDER Height & Weight     Self    Incontinent, External catheter Weight: 158 lb 1.1 oz (71.7 kg) Height:  5\' 11"  (180.3 cm)  BEHAVIORAL SYMPTOMS/MOOD NEUROLOGICAL BOWEL NUTRITION STATUS      Continent Feeding tube, Diet(Dys 2 diet, nectar thick liquids; PEG tube, osmolite feeding 1.2 cal continuous feeds)  AMBULATORY STATUS COMMUNICATION OF NEEDS Skin   Limited Assist Verbally Bruising, Other (Comment)(abrasion on left ankle, celluilitis on back/buttocks/groin, MASD on groin/sacrum, pressure injury on sacrum (PRN dressing change))                       Personal Care Assistance Level of Assistance  Bathing, Feeding, Dressing, Total care Bathing Assistance: Maximum assistance Feeding assistance: Limited assistance Dressing Assistance: Maximum assistance Total Care Assistance: Maximum assistance   Functional Limitations Info  Sight, Speech, Hearing Sight Info: Adequate Hearing Info: Adequate Speech Info: Adequate    SPECIAL CARE FACTORS FREQUENCY                       Contractures Contractures Info: Not present    Additional Factors Info  Code Status, Allergies, Psychotropic Code Status Info: DNR Allergies Info: No Known Allergies Psychotropic Info: depakene 125mg  2x daily through tube         Current Medications (01/10/2019):  This is the current hospital active medication list Current Facility-Administered Medications  Medication Dose Route Frequency Provider Last Rate Last Dose  . 0.9 %  sodium chloride infusion   Intravenous Continuous Pahwani, Rinka R, MD 125 mL/hr at 01/10/19 0849    .  amoxicillin-clavulanate (AUGMENTIN) 875-125 MG per tablet 1 tablet  1 tablet Oral Q12H Dixon, Gomez Cleverly, NP      . donepezil (ARICEPT) tablet 10 mg  10 mg Per Tube QHS Amin, Ankit Chirag, MD   10 mg at 01/09/19 2047  . enoxaparin (LOVENOX) injection 40 mg  40 mg Subcutaneous Q24H Pahwani, Rinka R, MD    40 mg at 01/09/19 1800  . feeding supplement (OSMOLITE 1.5 CAL) liquid 1,000 mL  1,000 mL Per Tube Continuous Aquilla Hacker, MD 55 mL/hr at 01/09/19 2057 1,000 mL at 01/09/19 2057  . feeding supplement (PRO-STAT SUGAR FREE 64) liquid 30 mL  30 mL Per Tube BID Aquilla Hacker, MD   30 mL at 01/09/19 2046  . fluconazole (DIFLUCAN) 40 MG/ML suspension 100 mg  100 mg Oral Daily Blanchard Kelch, NP   100 mg at 01/10/19 0853  . influenza vaccine adjuvanted (FLUAD) injection 0.5 mL  0.5 mL Intramuscular Tomorrow-1000 Pahwani, Rinka R, MD      . levETIRAcetam (KEPPRA) 100 MG/ML solution 750 mg  750 mg Per Tube BID Amin, Ankit Chirag, MD   750 mg at 01/10/19 0853  . metoprolol tartrate (LOPRESSOR) 25 mg/10 mL oral suspension 12.5 mg  12.5 mg Per Tube BID Scarlett Presto, RPH   12.5 mg at 01/10/19 0175  . morphine 2 MG/ML injection 2 mg  2 mg Intravenous Q4H PRN Pahwani, Rinka R, MD      . multivitamin liquid 15 mL  15 mL Per Tube Daily Scarlett Presto, RPH   15 mL at 01/10/19 0854  . ondansetron (ZOFRAN) tablet 4 mg  4 mg Oral Q6H PRN Pahwani, Rinka R, MD       Or  . ondansetron (ZOFRAN) injection 4 mg  4 mg Intravenous Q6H PRN Pahwani, Rinka R, MD      . valproic acid (DEPAKENE) 250 MG/5ML solution 125 mg  125 mg Per Tube BID Dimple Nanas, MD   125 mg at 01/10/19 0854     Discharge Medications: Please see discharge summary for a list of discharge medications.  Relevant Imaging Results:  Relevant Lab Results:   Additional Information SSN: [id number]  Nada Boozer Kelsha Older, LCSWA

## 2019-01-10 NOTE — Progress Notes (Signed)
CCMD reported 7 beats of V tach. Pt stable. MD paged. Will continue to monitor.

## 2019-01-10 NOTE — TOC Initial Note (Signed)
Transition of Care [facility]) - Initial/Assessment Note    Patient Details  Name: Joshua Gonzales MRN: [id number] Date of Birth: March 01, 1941  [facility]) CM/SW Contact:    Gwenlyn Fudge, LCSWA Phone Number: 01/10/2019, 5:24 PM  Clinical Narrative:                  CSW called the patient's sister, [name]. CSW introduced herself and explained her role. CSW asked if they could send the patient back to [facility] whenever he was medically ready. She agreed and did not have any other questions or concerns.   CSW will continue to follow and assist with discharge planning.   Expected Discharge Plan: Skilled Nursing Facility Barriers to Discharge: Continued Medical Work up   Patient Goals and CMS Choice Patient states their goals for this hospitalization and ongoing recovery are:: Pt will [facility] whenever he is medically ready.   Choice offered to / list presented to : NA  Expected Discharge Plan and Services Expected Discharge Plan: Skilled Nursing Facility In-house Referral: Clinical Social Work Discharge Planning Services: NA Post Acute Care Choice: Skilled Nursing Facility Living arrangements for the past 2 months: Skilled Nursing Facility                 DME Arranged: N/A DME Agency: NA       HH Arranged: NA HH Agency: NA        Prior Living Arrangements/Services Living arrangements for the past 2 months: Skilled Nursing Facility Lives with:: Facility Resident Patient language and need for interpreter reviewed:: No Do you feel safe going back to the place where you live?: Yes      Need for Family Participation in Patient Care: Yes (Comment) Care giver support system in place?: Yes (comment)   Criminal Activity/Legal Involvement Pertinent to Current Situation/Hospitalization: No - Comment as needed  Activities of Daily Living      Permission Sought/Granted Permission sought to share information with : Case Manager Permission  granted to share information with : Yes, Verbal Permission Granted  Share Information with NAME: [name]  Permission granted to share info w AGENCY: [facility]  Permission granted to share info w Relationship: Sister     Emotional Assessment Appearance:: Appears stated age Attitude/Demeanor/Rapport: Unable to Assess Affect (typically observed): Unable to Assess Orientation: : Oriented to Self Alcohol / Substance Use: Not Applicable Psych Involvement: No (comment)  Admission diagnosis:  Fever of unknown origin [R50.9] Sacral wound [S31.000A] Patient Active Problem List   Diagnosis Date Noted  . Sacral wound 01/07/2019  . HTN (hypertension) 01/07/2019  . Alzheimer's dementia (HCC) 01/07/2019  . Osteomyelitis of vertebra, sacral and sacrococcygeal region (HCC)   . Pressure injury of skin 11/24/2018  . Acute metabolic encephalopathy 11/23/2018  . Cellulitis 11/23/2018  . Osteomyelitis (HCC) 11/23/2018  . Constipation 11/23/2018  . Goals of care, counseling/discussion   . Palliative care by specialist   . FTT (failure to thrive) in adult   . Type 2 diabetes mellitus without complication (HCC) 08/16/2018  . History of CVA with residual deficit 08/16/2018  . Hyperammonemia (HCC) 08/16/2018  . Bradycardia 08/16/2018  . Lactic acid increased   . Hypotension 06/03/2014  . Near syncope 06/03/2014  . AKI (acute kidney injury) (HCC) 06/03/2014  . Partial anterior cerebral circulation infarction (HCC)   . Dementia with behavioral disturbance (HCC)   . PAF (paroxysmal atrial fibrillation) (HCC)   . History of seizures 05/15/2014  .  Essential hypertension, benign 01/24/2013  . Unspecified late effects of cerebrovascular disease 01/24/2013  . Acute encephalopathy 11/26/2012  . Hypokalemia 11/26/2012  . Alcoholism in remission ([place]) 11/26/2012   PCP:  Seward Carol, MD Pharmacy:   [facility] [place], [place] [address] Phone: [phone] Fax: [phone]     Social Determinants of Health (SDOH) Interventions    Readmission Risk Interventions No flowsheet data found.

## 2019-01-11 ENCOUNTER — Encounter (HOSPITAL_COMMUNITY): Payer: Self-pay | Admitting: Surgery

## 2019-01-11 LAB — GLUCOSE, CAPILLARY
Glucose-Capillary: 114 mg/dL — ABNORMAL HIGH (ref 70–99)
Glucose-Capillary: 122 mg/dL — ABNORMAL HIGH (ref 70–99)
Glucose-Capillary: 120 mg/dL — ABNORMAL HIGH (ref 70–99)
Glucose-Capillary: 142 mg/dL — ABNORMAL HIGH (ref 70–99)
Glucose-Capillary: 136 mg/dL — ABNORMAL HIGH (ref 70–99)
Glucose-Capillary: 135 mg/dL — ABNORMAL HIGH (ref 70–99)

## 2019-01-11 LAB — CBC
HCT: 40.2 % (ref 39.0–52.0)
Hemoglobin: 12.4 g/dL — ABNORMAL LOW (ref 13.0–17.0)
MCH: 30.3 pg (ref 26.0–34.0)
MCHC: 30.8 g/dL (ref 30.0–36.0)
MCV: 98.3 fL (ref 80.0–100.0)
Platelets: 230 10*3/uL (ref 150–400)
RBC: 4.09 MIL/uL — ABNORMAL LOW (ref 4.22–5.81)
RDW: 16.4 % — ABNORMAL HIGH (ref 11.5–15.5)
WBC: 5.3 10*3/uL (ref 4.0–10.5)
nRBC: 0 % (ref 0.0–0.2)

## 2019-01-11 LAB — URINE CULTURE: Culture: 2000 — AB

## 2019-01-11 LAB — COMPREHENSIVE METABOLIC PANEL
ALT: 11 U/L (ref 0–44)
AST: 19 U/L (ref 15–41)
Albumin: 2.4 g/dL — ABNORMAL LOW (ref 3.5–5.0)
Alkaline Phosphatase: 47 U/L (ref 38–126)
Anion gap: 9 (ref 5–15)
BUN: 8 mg/dL (ref 8–23)
CO2: 24 mmol/L (ref 22–32)
Calcium: 8.6 mg/dL — ABNORMAL LOW (ref 8.9–10.3)
Chloride: 106 mmol/L (ref 98–111)
Creatinine, Ser: 0.74 mg/dL (ref 0.61–1.24)
GFR calc Af Amer: >60 mL/min (ref >60)
GFR calc non Af Amer: >60 mL/min (ref >60)
Glucose, Bld: 122 mg/dL — ABNORMAL HIGH (ref 70–99)
Potassium: 3.9 mmol/L (ref 3.5–5.1)
Sodium: 139 mmol/L (ref 135–145)
Total Bilirubin: 0.4 mg/dL (ref 0.3–1.2)
Total Protein: 6.9 g/dL (ref 6.5–8.1)

## 2019-01-11 LAB — PHOSPHORUS: Phosphorus: 3.1 mg/dL (ref 2.5–4.6)

## 2019-01-11 LAB — MAGNESIUM: Magnesium: 1.9 mg/dL (ref 1.7–2.4)

## 2019-01-11 MED ORDER — AMLODIPINE BESYLATE 5 MG PO TABS
5.0000 mg | ORAL_TABLET | Freq: Every day | ORAL | Status: DC
  Administered 2019-01-11 – 2019-01-14 (×4): 5 mg via ORAL
  Filled 2019-01-11 (×4): qty 1

## 2019-01-11 NOTE — TOC Progression Note (Signed)
Transition of Care [facility]) - Progression Note    Patient Details  Name: Joshua Gonzales MRN: [id number] Date of Birth: 1940/09/01  Transition of Care [facility]) CM/SW [place], LCSW Phone Number:[phone] 01/11/2019, 1:54 PM  Clinical Narrative:     CSW was alerted by MD that patient would be medically stable for discharge tomorrow. CSW attempted to follow up with Freda Munro at [facility] to ascertain facility bed readiness. CSW had to leave a message. CSW updated MD.  CSW will continue to follow for discharge planning needs.   Expected Discharge Plan: [place] Barriers to Discharge: Continued Medical Work up  Expected Discharge Plan and Services Expected Discharge Plan: [place] In-house Referral: Clinical Social Work Discharge Planning Services: NA Post Acute Care Choice: [place] Living arrangements for the past 2 months: [place]                 DME Arranged: N/A DME Agency: NA       HH Arranged: NA HH Agency: NA         Social Determinants of Health (SDOH) Interventions    Readmission Risk Interventions No flowsheet data found.

## 2019-01-11 NOTE — Progress Notes (Signed)
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 Patient is laying calmly in bed. Not in acute distress. Alert and only oriented x1. Marland Kitchen  PEG tube in place without any erythema or leakage at site.  Patient is currently on tube feeding at 45 cc/h dietary/nutrition.   Infectious disease discontinued IV antibiotics and started Augmentin and Diflucan via NG tube.  ID has signed off at this time. Discuss with social worker on possible discharge back to SNF tomorrow.  .   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 Patient was readmitted after discharge from hospital 6 weeks ago due to failed treatment for osteomyelitis. MRI showed worsening of infection/osteomyelitis with possible myositis. Blood culture showed no growth in 2 days. Infectious disease discontinued IV antibiotics started Augmentin and Diflucan via NG tube.  ID has signed off. Plan is to discuss with social worker on discharge the patient back to SNF tomorrow. I  Essential hypertension Resume metoprolol tartrate 25 mg twice daily   Dementia, Alzheimer type  Continue Aricept via G-tube  Paroxysmal Atrial fibrillation Continue with metoprolol tartrate 25 mg twice daily. Not on anticoagulation                                                                                                                  DiabetesMellitus type II Diet controlled. Continue fingersticks twice daily to monitor for potential hypoglycemic event.  History of CVA History of seizures Continue with Keppra and Depakote.  History of neurosyphilis treated in 2014.  Stable.   No intervention at this time  Failure to thrive adult. Tube feeding with increased residual of 200 cc Decrease tube feeding to 45 cc/h and monitor residual. .  Prognosis is guarded due to worsening sacral decubitus ulcer/osteomyelitis              .   DVT prophylaxis: Lovenox Code Status: DNR Family Communication: None                                              Disposition: More than 2 nights.   Subjective: Patient was evaluated at bedside. Not in acute distress.  He is alert but oriented x1.  He is calm and lying quietly in bed.  Able to answer questions.  Significant cognitive impairment.  Review of Systems General: No reported fever, chills or night sweats.      Resp: No cough, wheezing or shortness of breath.   Cardiac: No chest pain, palpitations, orthopnea, paroxysmal nocturnal dyspnea. GI: No documented abdominal pain, nausea, vomiting, diarrhea or constipation GU:No dysuria, frequency, hesitancy or incontinence MS: Denies muscle aches, joint pain or swelling Neuro: Denies headache, neurologic deficits (focal weakness, numbness, tingling), abnormal gait Psych: Unable to assess.  Skin: No rashes or lesions. PEG tube in place.   Objective: Vitals:   01/11/19 0420 01/11/19 0603 01/11/19 0720 01/11/19 1118  BP: (!) 149/100  (!) 166/107 (!) 177/106  Pulse: 78  86 76  Resp: 16  18 18   Temp: 97.6 F (36.4 C)  99.4 F (37.4 C) 98.9 F (37.2 C)  TempSrc:   Oral Oral  SpO2: 95%  93% 96%  Weight:  72  kg    Height:        Intake/Output Summary (Last 24 hours) at 01/11/2019 1316 Last data filed at 01/11/2019 0900 Gross per 24 hour  Intake 2146.16 ml  Output -  Net 2146.16 ml   Filed Weights   01/09/19 0900 01/10/19 0135 01/11/19 0603  Weight: 70.2 kg 71.7 kg 72 kg    Examination:  General exam: Appears calm and comfortable.   Appears chronically ill Respiratory system: Decreased bibasilar breath sounds.  No rhonchi, rales . Cardiovascular system: S1 & S2 heard, RRR. No JVD, murmurs, rubs, gallops or clicks. No pedal edema. Gastrointestinal system: Nontender nondistended.  G-tube noted Central nervous system: Alert to his name only.  Bilateral upper extremity contractures.  Fixed flexion contractures. . Extremities: Bilateral upper extremity contractures .13/07/20 Skin: Stage IV sacral decubitus ulcer with surrounding erythema Psychiatry: Aggressive response to questions.  Dementia with significant cognitive impairment.   External catheter  Data Reviewed:   CBC: Recent Labs  Lab 01/07/19 1624 01/08/19 0049 01/08/19 0439 01/11/19 0355  WBC 6.2 6.6 5.9 5.3  NEUTROABS 3.5  --   --   --   HGB 13.7 12.9* 13.0 12.4*  HCT 45.0 40.6 41.6 40.2  MCV 98.5 96.0 96.7 98.3  PLT 250 223 228 230   Basic Metabolic Panel: Recent Labs  Lab 01/07/19 1624 01/08/19 0049 01/08/19 0439 01/10/19 0533 01/11/19 0355  NA 140  --  141 140 139  K 4.3  --  3.9 4.1 3.9  CL 103  --  106 107 106  CO2 27  --  25 25 24   GLUCOSE 108*  --  90 126* 122*  BUN 15  --  11 6* 8  CREATININE 0.70 0.65 0.66 0.71 0.74  CALCIUM 9.2  --  8.8* 8.9 8.6*  MG  --   --   --   --  1.9  PHOS  --   --   --   --  3.1   GFR: Estimated Creatinine Clearance: 80 mL/min (by C-G formula based on SCr of 0.74 mg/dL). Liver Function Tests: Recent Labs  Lab 01/07/19 1624 01/08/19 0439 01/11/19 0355  AST 27 23 19   ALT 15 14 11   ALKPHOS 75 54 47  BILITOT 0.5 0.5 0.4  PROT 8.2* 7.1 6.9  ALBUMIN 2.8* 2.5* 2.4*   No  results for input(s): LIPASE, AMYLASE in the last 168 hours. No results for input(s): AMMONIA in the last 168 hours. Coagulation Profile: No results for input(s): INR, PROTIME in the last 168 hours. Cardiac Enzymes: No results for input(s): CKTOTAL, CKMB, CKMBINDEX, TROPONINI in the last 168 hours. BNP (last 3 results) No results for input(s): PROBNP in the last 8760 hours. HbA1C: No results for input(s): HGBA1C in the last 72 hours. CBG: Recent Labs  Lab 01/10/19 2023 01/11/19 0002 01/11/19 0417 01/11/19 0812 01/11/19 1119  GLUCAP 143* 114* 122* 120* 142*   Lipid Profile: No results for input(s): CHOL, HDL, LDLCALC, TRIG, CHOLHDL, LDLDIRECT in the last 72 hours. Thyroid Function Tests: No results for input(s): TSH, T4TOTAL, FREET4, T3FREE, THYROIDAB in the last 72 hours. Anemia Panel: No results for input(s): VITAMINB12, FOLATE, FERRITIN, TIBC, IRON, RETICCTPCT in the last 72 hours. Sepsis Labs: Recent Labs  Lab 01/07/19 1603  LATICACIDVEN 1.7    Recent Results (from the past 240 hour(s))  Blood Culture (routine x 2)     Status: None (Preliminary result)   Collection Time: 01/07/19  4:15 PM   Specimen: BLOOD RIGHT FOREARM  Result Value Ref Range Status   Specimen Description BLOOD RIGHT FOREARM  Final   Special Requests   Final    BOTTLES DRAWN AEROBIC AND ANAEROBIC Blood Culture adequate volume   Culture   Final    NO GROWTH 4 DAYS Performed at [facility], [place] [address], [place], [place] [address]    Report Status PENDING  Incomplete  SARS CORONAVIRUS 2 (TAT 6-24 HRS) Nasopharyngeal Nasopharyngeal Swab     Status: None   Collection Time: 01/07/19  4:22 PM   Specimen: Nasopharyngeal Swab  Result Value Ref Range Status   SARS Coronavirus 2 NEGATIVE NEGATIVE Final    Comment: (NOTE) SARS-CoV-2 target nucleic acids are NOT DETECTED. The SARS-CoV-2 RNA is generally detectable in upper and lower respiratory specimens during the acute phase of infection.  Negative results do not preclude SARS-CoV-2 infection, do not rule out co-infections with other pathogens, and should not be used as the sole basis for treatment or other patient management decisions. Negative results must be combined with clinical observations, patient history, and epidemiological information. The expected result is Negative. Fact Sheet for Patients: SugarRoll.be Fact Sheet for Healthcare Providers: https://www.woods-mathews.com/ This test is not yet approved or cleared by the [place] FDA and  has been authorized for detection and/or diagnosis of SARS-CoV-2 by FDA under an Emergency Use Authorization (EUA). This EUA will remain  in effect (meaning this test can be used) for the duration of the COVID-19 declaration under Section 56 4(b)(1) of the Act, 21 U.S.C. section 360bbb-3(b)(1), unless the authorization is terminated or revoked sooner. Performed at [facility], [place] [address], [place], [place] [address]   Blood Culture (routine x 2)     Status: None (Preliminary result)   Collection Time: 01/07/19  4:24 PM   Specimen: BLOOD RIGHT WRIST  Result Value Ref Range Status   Specimen Description BLOOD RIGHT WRIST  Final   Special Requests   Final  BOTTLES DRAWN AEROBIC AND ANAEROBIC Blood Culture adequate volume   Culture   Final    NO GROWTH 4 DAYS Performed at [facility], [address] [place], [place] [address]    Report Status PENDING  Incomplete  Urine culture     Status: Abnormal   Collection Time: 01/07/19  7:10 PM   Specimen: In/Out Cath Urine  Result Value Ref Range Status   Specimen Description IN/OUT CATH URINE  Final   Special Requests   Final    NONE Performed at [facility], [address] [place], [place] [address]    Culture (A)  Final    2,000 COLONIES/mL PSEUDOMONAS AERUGINOSA 2,000 COLONIES/mL ENTEROBACTER CLOACAE    Report Status 01/11/2019 FINAL  Final    Organism ID, Bacteria PSEUDOMONAS AERUGINOSA (A)  Final   Organism ID, Bacteria ENTEROBACTER CLOACAE (A)  Final      Susceptibility   Enterobacter cloacae - MIC*    CEFAZOLIN >=64 RESISTANT Resistant     CEFTRIAXONE >=64 RESISTANT Resistant     CIPROFLOXACIN <=0.25 SENSITIVE Sensitive     GENTAMICIN <=1 SENSITIVE Sensitive     IMIPENEM 0.5 SENSITIVE Sensitive     NITROFURANTOIN 32 SENSITIVE Sensitive     TRIMETH/SULFA <=20 SENSITIVE Sensitive     PIP/TAZO >=128 RESISTANT Resistant     * 2,000 COLONIES/mL ENTEROBACTER CLOACAE   Pseudomonas aeruginosa - MIC*    CEFTAZIDIME >=64 RESISTANT Resistant     CIPROFLOXACIN <=0.25 SENSITIVE Sensitive     GENTAMICIN <=1 SENSITIVE Sensitive     IMIPENEM 2 SENSITIVE Sensitive     CEFEPIME >=64 RESISTANT Resistant     * 2,000 COLONIES/mL PSEUDOMONAS AERUGINOSA  MRSA PCR Screening     Status: None   Collection Time: 01/09/19  4:55 AM   Specimen: Nasal Mucosa; Nasopharyngeal  Result Value Ref Range Status   MRSA by PCR NEGATIVE NEGATIVE Final    Comment:        The GeneXpert MRSA Assay (FDA approved for NASAL specimens only), is one component of a comprehensive MRSA colonization surveillance program. It is not intended to diagnose MRSA infection nor to guide or monitor treatment for MRSA infections. Performed at [facility], [address] [place], [place] [address]          Radiology Studies: No results found.      Scheduled Meds: . amoxicillin-clavulanate  1 tablet Oral Q12H  . donepezil  10 mg Per Tube QHS  . enoxaparin (LOVENOX) injection  40 mg Subcutaneous Q24H  . feeding supplement (PRO-STAT SUGAR FREE 64)  30 mL Per Tube BID  . fluconazole  100 mg Oral Daily  . influenza vaccine adjuvanted  0.5 mL Intramuscular Tomorrow-1000  . levETIRAcetam  750 mg Per Tube BID  . metoprolol tartrate  12.5 mg Per Tube BID  . multivitamin  15 mL Per Tube Daily  . valproic acid  125 mg Per Tube BID   Continuous Infusions:  . sodium chloride 125 mL/hr at 01/10/19 2247  . feeding supplement (OSMOLITE 1.5 CAL) 1,000 mL (01/09/19 2057)     LOS: 4 days   Time spent 35 mins    Aquilla HackerFelix O Witt Plitt, MD [facility] Pager [phone]  If 7PM-7AM, please contact night-coverage  01/11/2019, 1:16 PM

## 2019-01-12 LAB — COMPREHENSIVE METABOLIC PANEL
ALT: 11 U/L (ref 0–44)
AST: 23 U/L (ref 15–41)
Albumin: 2.5 g/dL — ABNORMAL LOW (ref 3.5–5.0)
Alkaline Phosphatase: 52 U/L (ref 38–126)
Anion gap: 8 (ref 5–15)
BUN: 8 mg/dL (ref 8–23)
CO2: 24 mmol/L (ref 22–32)
Calcium: 8.9 mg/dL (ref 8.9–10.3)
Chloride: 105 mmol/L (ref 98–111)
Creatinine, Ser: 0.69 mg/dL (ref 0.61–1.24)
GFR calc Af Amer: >60 mL/min (ref >60)
GFR calc non Af Amer: >60 mL/min (ref >60)
Glucose, Bld: 125 mg/dL — ABNORMAL HIGH (ref 70–99)
Potassium: 4.3 mmol/L (ref 3.5–5.1)
Sodium: 137 mmol/L (ref 135–145)
Total Bilirubin: 0.3 mg/dL (ref 0.3–1.2)
Total Protein: 7.4 g/dL (ref 6.5–8.1)

## 2019-01-12 LAB — CBC
HCT: 41.4 % (ref 39.0–52.0)
Hemoglobin: 13 g/dL (ref 13.0–17.0)
MCH: 30 pg (ref 26.0–34.0)
MCHC: 31.4 g/dL (ref 30.0–36.0)
MCV: 95.4 fL (ref 80.0–100.0)
Platelets: 234 10*3/uL (ref 150–400)
RBC: 4.34 MIL/uL (ref 4.22–5.81)
RDW: 16.2 % — ABNORMAL HIGH (ref 11.5–15.5)
WBC: 5.1 10*3/uL (ref 4.0–10.5)
nRBC: 0 % (ref 0.0–0.2)

## 2019-01-12 LAB — CULTURE, BLOOD (ROUTINE X 2)
Culture: NO GROWTH
Culture: NO GROWTH
Special Requests: ADEQUATE
Special Requests: ADEQUATE

## 2019-01-12 LAB — GLUCOSE, CAPILLARY
Glucose-Capillary: 118 mg/dL — ABNORMAL HIGH (ref 70–99)
Glucose-Capillary: 125 mg/dL — ABNORMAL HIGH (ref 70–99)
Glucose-Capillary: 126 mg/dL — ABNORMAL HIGH (ref 70–99)
Glucose-Capillary: 137 mg/dL — ABNORMAL HIGH (ref 70–99)
Glucose-Capillary: 140 mg/dL — ABNORMAL HIGH (ref 70–99)
Glucose-Capillary: 149 mg/dL — ABNORMAL HIGH (ref 70–99)
Glucose-Capillary: 157 mg/dL — ABNORMAL HIGH (ref 70–99)

## 2019-01-12 LAB — MAGNESIUM: Magnesium: 1.9 mg/dL (ref 1.7–2.4)

## 2019-01-12 NOTE — TOC Progression Note (Signed)
Transition of Care [facility]) - Progression Note    Patient Details  Name: Joshua Gonzales MRN: [id number] Date of Birth: 12/09/1940  Transition of Care [facility] CM/SW [place], [place] Phone Number: [phone] 01/12/2019, 12:10 PM  Clinical Narrative:     CSW followed up with facility again and was unable to reach [place], left a message.  TOC team will continue to follow for discharge planning.  Expected Discharge Plan: [place] Barriers to Discharge: Continued Medical Work up  Expected Discharge Plan and Services Expected Discharge Plan: [place] In-house Referral: Clinical Social Work Discharge Planning Services: NA Post Acute Care Choice: [place] Living arrangements for the past 2 months: [place]                 DME Arranged: N/A DME Agency: NA       HH Arranged: NA HH Agency: NA         Social Determinants of Health (SDOH) Interventions    Readmission Risk Interventions No flowsheet data found.

## 2019-01-12 NOTE — Progress Notes (Signed)
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 Patient is laying calmly in bed. Not in acute distress. Alert and only oriented x1. Marland Kitchen  PEG tube in place without any erythema or leakage at site.  Patient is currently on tube feeding at 45 cc/h dietary/nutrition.   Infectious disease discontinued IV antibiotics and started Augmentin and Diflucan via NG tube.  ID has signed off at this time. Discuss with social worker on possible discharge back to SNF. Patient is stable for discharge.  Awaiting acceptance from nursing facility and then discharge back.    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 Patient was readmitted after discharge from hospital 6 weeks ago due to failed treatment for osteomyelitis. MRI showed worsening of infection/osteomyelitis with possible myositis. Blood culture showed no growth in 2 days. Infectious disease discontinued IV antibiotics started Augmentin and Diflucan via NG tube.  ID has signed off. Social worker called facility and awaiting return back call. He is stable for discharge as soon as facility is read accept him back. I  Essential hypertension Resume metoprolol tartrate 25 mg twice daily   Dementia, Alzheimer type  Continue Aricept via G-tube  Paroxysmal Atrial fibrillation Continue with metoprolol tartrate 25 mg twice  daily. Not on anticoagulation                                                                                                                 DiabetesMellitus type II Diet controlled. Continue fingersticks twice daily to monitor for potential hypoglycemic event.  History of CVA History of seizures Continue with Keppra and Depakote.  History of neurosyphilis treated in 2014.  Stable.   No intervention at this time  Failure to thrive adult. Tube feeding with increased residual of 200 cc Decrease tube feeding to 45 cc/h and monitor residual. .  Prognosis is guarded due to worsening sacral decubitus ulcer/osteomyelitis              .   DVT prophylaxis: Lovenox Code Status: DNR Family Communication: None                                              Disposition: Stable for discharge back to SNF.  Subjective: Patient was evaluated at bedside. Not in acute distress.  He is alert but oriented x1.  He is calm and lying quietly in bed.  Able to answer questions.  Significant cognitive impairment.  Review of Systems General: No reported fever, chills or night sweats.      Resp: No cough, wheezing or shortness of breath.   Cardiac: No chest pain, palpitations, orthopnea, paroxysmal nocturnal dyspnea. GI: No documented abdominal pain, nausea, vomiting, diarrhea or constipation GU:No dysuria, frequency, hesitancy or incontinence MS: Denies muscle aches, joint pain or swelling Neuro: Denies headache, neurologic deficits (focal weakness, numbness, tingling), abnormal gait Psych: Unable to assess.  Skin: No rashes or lesions. PEG tube in place.   Objective: Vitals:   01/11/19 1952 01/12/19 0426 01/12/19 0815 01/12/19 1317  BP: (!) 152/89 (!) 148/99 (!) 141/85 (!)  143/87  Pulse: 91 71 82 (!) 102  Resp: 20 18 18 18   Temp: 97.6 F (36.4 C) 100 F (37.8 C) 99.5 F (37.5 C) 99.1 F (37.3 C)  TempSrc: Oral Oral Oral Oral  SpO2: 95% 96% 93% 93%  Weight:  73.4 kg    Height:        Intake/Output Summary (Last 24 hours) at 01/12/2019 1435 Last data filed at 01/12/2019 1200 Gross per 24 hour  Intake 1442.73 ml  Output 300 ml  Net 1142.73 ml   Filed Weights   01/10/19 0135 01/11/19 0603 01/12/19 0426  Weight: 71.7 kg 72 kg 73.4 kg    Examination:  General exam: Appears calm and comfortable.   Appears chronically ill Respiratory system: Decreased bibasilar breath sounds.  No rhonchi, rales . Cardiovascular system: S1 & S2 heard, RRR. No JVD, murmurs, rubs, gallops or clicks. No pedal edema. Gastrointestinal system: Nontender nondistended.  G-tube noted Central nervous system: Alert to his name only.  Bilateral upper extremity contractures.  Fixed flexion contractures. . Extremities: Bilateral upper extremity contractures .Marland Kitchen. Skin: Stage IV sacral decubitus ulcer with surrounding erythema Psychiatry: Aggressive response to questions.  Dementia with significant cognitive impairment.   External catheter  Data Reviewed:   CBC: Recent Labs  Lab 01/07/19 1624 01/08/19 0049 01/08/19 0439 01/11/19 0355 01/12/19 0407  WBC 6.2 6.6 5.9 5.3 5.1  NEUTROABS 3.5  --   --   --   --   HGB 13.7 12.9* 13.0 12.4* 13.0  HCT 45.0 40.6 41.6 40.2 41.4  MCV 98.5 96.0 96.7 98.3 95.4  PLT 250 223 228 230 234   Basic Metabolic Panel: Recent Labs  Lab 01/07/19 1624 01/08/19 0049 01/08/19 0439 01/10/19 0533 01/11/19 0355 01/12/19 0407  NA 140  --  141 140 139 137  K 4.3  --  3.9 4.1 3.9 4.3  CL 103  --  106 107 106 105  CO2 27  --  25 25 24 24   GLUCOSE 108*  --  90 126* 122* 125*  BUN 15  --  11 6* 8 8  CREATININE 0.70 0.65 0.66 0.71 0.74 0.69  CALCIUM 9.2  --  8.8* 8.9 8.6* 8.9  MG  --   --   --   --  1.9 1.9  PHOS  --   --   --   --  3.1  --     GFR: Estimated Creatinine Clearance: 81.6 mL/min (by C-G formula based on SCr of 0.69 mg/dL). Liver Function Tests: Recent Labs  Lab 01/07/19 1624 01/08/19 0439 01/11/19 0355 01/12/19 0407  AST 27 23 19 23   ALT 15 14 11 11   ALKPHOS 75 54 47 52  BILITOT 0.5 0.5 0.4 0.3  PROT 8.2* 7.1 6.9 7.4  ALBUMIN 2.8* 2.5* 2.4* 2.5*   No results for input(s): LIPASE, AMYLASE in the last 168 hours. No results for input(s): AMMONIA in the last 168 hours. Coagulation Profile: No results for input(s): INR, PROTIME in the last 168 hours. Cardiac Enzymes: No results for input(s): CKTOTAL, CKMB, CKMBINDEX, TROPONINI in the last 168 hours. BNP (last 3 results) No results for input(s): PROBNP in the last 8760 hours. HbA1C: No results for input(s): HGBA1C in the last 72 hours. CBG: Recent Labs  Lab 01/11/19 1949 01/11/19 2342 01/12/19 0424 01/12/19 0812 01/12/19 1129  GLUCAP 135* 149* 126* 137* 125*   Lipid Profile: No results for input(s): CHOL, HDL, LDLCALC, TRIG, CHOLHDL, LDLDIRECT in the last 72 hours. Thyroid Function Tests: No results for input(s): TSH, T4TOTAL, FREET4, T3FREE, THYROIDAB in the last 72 hours. Anemia Panel: No results for input(s): VITAMINB12, FOLATE, FERRITIN, TIBC, IRON, RETICCTPCT in the last 72 hours. Sepsis Labs: Recent Labs  Lab 01/07/19 1603  LATICACIDVEN 1.7    Recent Results (from the past 240 hour(s))  Blood Culture (routine x 2)     Status: None   Collection Time: 01/07/19  4:15 PM   Specimen: BLOOD RIGHT FOREARM  Result Value Ref Range Status   Specimen Description BLOOD RIGHT FOREARM  Final   Special Requests   Final    BOTTLES DRAWN AEROBIC AND ANAEROBIC Blood Culture adequate volume   Culture   Final    NO GROWTH 5 DAYS Performed at [facility], [address] [place], [place] [address]    Report Status 01/12/2019 FINAL  Final  SARS CORONAVIRUS 2 (TAT 6-24 HRS) Nasopharyngeal Nasopharyngeal Swab     Status: None   Collection Time:  01/07/19  4:22 PM   Specimen: Nasopharyngeal Swab  Result Value Ref Range Status   SARS Coronavirus 2 NEGATIVE NEGATIVE Final    Comment: (NOTE) SARS-CoV-2 target nucleic acids are NOT DETECTED. The SARS-CoV-2 RNA is generally detectable in upper and lower respiratory specimens during the acute phase of infection. Negative results do not preclude SARS-CoV-2 infection, do not rule out co-infections with other pathogens, and should not be used as the sole basis for treatment or other patient management decisions. Negative results must be combined with clinical observations, patient history, and epidemiological information. The expected result is Negative. Fact Sheet for Patients: SugarRoll.be Fact Sheet for Healthcare Providers: https://www.woods-mathews.com/ This test is not yet approved or cleared by the [place] FDA and  has been authorized for detection and/or diagnosis of SARS-CoV-2 by FDA under an Emergency Use Authorization (EUA). This EUA will remain  in effect (meaning this test can be used) for the duration of the COVID-19 declaration under Section 56 4(b)(1) of the Act, 21 U.S.C. section 360bbb-3(b)(1), unless the authorization is terminated or revoked sooner. Performed at [facility], [place] [address] [place], [place] [address]   Blood  Culture (routine x 2)     Status: None   Collection Time: 01/07/19  4:24 PM   Specimen: BLOOD RIGHT WRIST  Result Value Ref Range Status   Specimen Description BLOOD RIGHT WRIST  Final   Special Requests   Final    BOTTLES DRAWN AEROBIC AND ANAEROBIC Blood Culture adequate volume   Culture   Final    NO GROWTH 5 DAYS Performed at [facility], [address] [place], [place] [address]    Report Status 01/12/2019 FINAL  Final  Urine culture     Status: Abnormal   Collection Time: 01/07/19  7:10 PM   Specimen: In/Out Cath Urine  Result Value Ref Range Status   Specimen  Description IN/OUT CATH URINE  Final   Special Requests   Final    NONE Performed at [facility], [address], [place], [place] [address]    Culture (A)  Final    2,000 COLONIES/mL PSEUDOMONAS AERUGINOSA 2,000 COLONIES/mL ENTEROBACTER CLOACAE    Report Status 01/11/2019 FINAL  Final   Organism ID, Bacteria PSEUDOMONAS AERUGINOSA (A)  Final   Organism ID, Bacteria ENTEROBACTER CLOACAE (A)  Final      Susceptibility   Enterobacter cloacae - MIC*    CEFAZOLIN >=64 RESISTANT Resistant     CEFTRIAXONE >=64 RESISTANT Resistant     CIPROFLOXACIN <=0.25 SENSITIVE Sensitive     GENTAMICIN <=1 SENSITIVE Sensitive     IMIPENEM 0.5 SENSITIVE Sensitive     NITROFURANTOIN 32 SENSITIVE Sensitive     TRIMETH/SULFA <=20 SENSITIVE Sensitive     PIP/TAZO >=128 RESISTANT Resistant     * 2,000 COLONIES/mL ENTEROBACTER CLOACAE   Pseudomonas aeruginosa - MIC*    CEFTAZIDIME >=64 RESISTANT Resistant     CIPROFLOXACIN <=0.25 SENSITIVE Sensitive     GENTAMICIN <=1 SENSITIVE Sensitive     IMIPENEM 2 SENSITIVE Sensitive     CEFEPIME >=64 RESISTANT Resistant     * 2,000 COLONIES/mL PSEUDOMONAS AERUGINOSA  MRSA PCR Screening     Status: None   Collection Time: 01/09/19  4:55 AM   Specimen: Nasal Mucosa; Nasopharyngeal  Result Value Ref Range Status   MRSA by PCR NEGATIVE NEGATIVE Final    Comment:        The GeneXpert MRSA Assay (FDA approved for NASAL specimens only), is one component of a comprehensive MRSA colonization surveillance program. It is not intended to diagnose MRSA infection nor to guide or monitor treatment for MRSA infections. Performed at [facility], [address], [place], [place] [address]          Radiology Studies: No results found.      Scheduled Meds: . amLODipine  5 mg Oral Daily  . amoxicillin-clavulanate  1 tablet Oral Q12H  . donepezil  10 mg Per Tube QHS  . enoxaparin (LOVENOX) injection  40 mg Subcutaneous Q24H  . feeding supplement  (PRO-STAT SUGAR FREE 64)  30 mL Per Tube BID  . fluconazole  100 mg Oral Daily  . influenza vaccine adjuvanted  0.5 mL Intramuscular Tomorrow-1000  . levETIRAcetam  750 mg Per Tube BID  . metoprolol tartrate  12.5 mg Per Tube BID  . multivitamin  15 mL Per Tube Daily  . valproic acid  125 mg Per Tube BID   Continuous Infusions: . sodium chloride 50 mL/hr at 01/11/19 1531  . feeding supplement (OSMOLITE 1.5 CAL) 1,000 mL (01/11/19 2102)     LOS: 5 days   Time spent  25 mins    Aquilla Hacker, MD [facility] Pager [phone]  If 7PM-7AM, please contact night-coverage  01/12/2019, 2:35 PM

## 2019-01-13 DIAGNOSIS — R509 Fever, unspecified: Secondary | ICD-10-CM

## 2019-01-13 LAB — CBC
HCT: 43.4 % (ref 39.0–52.0)
Hemoglobin: 13.7 g/dL (ref 13.0–17.0)
MCH: 30.2 pg (ref 26.0–34.0)
MCHC: 31.6 g/dL (ref 30.0–36.0)
MCV: 95.8 fL (ref 80.0–100.0)
Platelets: 251 10*3/uL (ref 150–400)
RBC: 4.53 MIL/uL (ref 4.22–5.81)
RDW: 16.4 % — ABNORMAL HIGH (ref 11.5–15.5)
WBC: 4.9 10*3/uL (ref 4.0–10.5)
nRBC: 0 % (ref 0.0–0.2)

## 2019-01-13 LAB — COMPREHENSIVE METABOLIC PANEL
ALT: 13 U/L (ref 0–44)
AST: 24 U/L (ref 15–41)
Albumin: 2.7 g/dL — ABNORMAL LOW (ref 3.5–5.0)
Alkaline Phosphatase: 57 U/L (ref 38–126)
Anion gap: 10 (ref 5–15)
BUN: 11 mg/dL (ref 8–23)
CO2: 24 mmol/L (ref 22–32)
Calcium: 9.1 mg/dL (ref 8.9–10.3)
Chloride: 103 mmol/L (ref 98–111)
Creatinine, Ser: 0.67 mg/dL (ref 0.61–1.24)
GFR calc Af Amer: >60 mL/min (ref >60)
GFR calc non Af Amer: >60 mL/min (ref >60)
Glucose, Bld: 114 mg/dL — ABNORMAL HIGH (ref 70–99)
Potassium: 4.4 mmol/L (ref 3.5–5.1)
Sodium: 137 mmol/L (ref 135–145)
Total Bilirubin: 0.6 mg/dL (ref 0.3–1.2)
Total Protein: 7.7 g/dL (ref 6.5–8.1)

## 2019-01-13 LAB — PHOSPHORUS: Phosphorus: 3.1 mg/dL (ref 2.5–4.6)

## 2019-01-13 LAB — GLUCOSE, CAPILLARY
Glucose-Capillary: 110 mg/dL — ABNORMAL HIGH (ref 70–99)
Glucose-Capillary: 120 mg/dL — ABNORMAL HIGH (ref 70–99)
Glucose-Capillary: 120 mg/dL — ABNORMAL HIGH (ref 70–99)
Glucose-Capillary: 135 mg/dL — ABNORMAL HIGH (ref 70–99)
Glucose-Capillary: 144 mg/dL — ABNORMAL HIGH (ref 70–99)
Glucose-Capillary: 145 mg/dL — ABNORMAL HIGH (ref 70–99)

## 2019-01-13 LAB — MAGNESIUM: Magnesium: 2.1 mg/dL (ref 1.7–2.4)

## 2019-01-13 LAB — SARS CORONAVIRUS 2 (TAT 6-24 HRS): SARS Coronavirus 2: NEGATIVE

## 2019-01-13 MED ORDER — AMOXICILLIN-POT CLAVULANATE 875-125 MG PO TABS: 1.0000 | ORAL_TABLET | Freq: Two times a day (BID) | ORAL | 0 refills | Status: AC

## 2019-01-13 MED ORDER — FLUCONAZOLE 40 MG/ML PO SUSR: 100.0000 mg | Freq: Every day | ORAL | 0 refills | Status: AC

## 2019-01-13 MED ORDER — PHENYLEPHRINE HCL-NACL 10-0.9 MG/250ML-% IV SOLN
INTRAVENOUS | Status: AC
  Filled 2019-01-13: qty 500

## 2019-01-13 MED ORDER — RESOURCE THICKENUP CLEAR PO POWD
ORAL | Status: DC | PRN
  Filled 2019-01-13: qty 125

## 2019-01-13 MED ORDER — AMLODIPINE BESYLATE 5 MG PO TABS: 5.0000 mg | ORAL_TABLET | Freq: Every day | ORAL | 3 refills | Status: AC

## 2019-01-13 NOTE — TOC Progression Note (Signed)
Transition of Care [facility]) - Progression Note    Patient Details  Name: Joshua Gonzales MRN: [id number] Date of Birth: 1941-01-14  Transition of Care [facility]) CM/SW [place], [place] Phone Number: 01/13/2019, 1:54 PM  Clinical Narrative:     Patient can return to [facility]. Patient is medically ready. Awaiting updated COVID screen. CSW talked with Freda Munro at [facility], they will be able to take him first thing Tuesday Morning.   CSW updated MD.   Expected Discharge Plan: Skilled Nursing Facility Barriers to Discharge: Other (comment)(Awaiting updated COVID screen.)  Expected Discharge Plan and Services Expected Discharge Plan: [place] In-house Referral: Clinical Social Work Discharge Planning Services: NA Post Acute Care Choice: [place] Living arrangements for the past 2 months: [place] Expected Discharge Date: 01/13/19               DME Arranged: N/A DME Agency: NA       HH Arranged: NA HH Agency: NA         Social Determinants of Health (SDOH) Interventions    Readmission Risk Interventions No flowsheet data found.

## 2019-01-13 NOTE — Plan of Care (Signed)
  Problem: Education: Goal: Knowledge of General Education information will improve Description Including pain rating scale, medication(s)/side effects and non-pharmacologic comfort measures Outcome: Progressing   

## 2019-01-13 NOTE — Discharge Summary (Addendum)
Physician Discharge Summary  Joshua Gonzales ZOX:[id number] DOB: September 11, 1940 DOA: 01/07/2019  PCP: [name], MD  Admit date: 01/07/2019 Discharge date: 01/14/2019  Admitted From: SNF Disposition:  SNF  Recommendations for Outpatient Follow-up:  1. Follow up with PCP in 1-2 weeks 2. 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 Patient is laying calmly in bed. Not in acute distress. Alert and only oriented x1. Marland Kitchen  PEG tube in place without any erythema or leakage at site.  Patient is currently on tube feeding at 45 cc/h dietary/nutrition.   Infectious disease discontinued IV antibiotics and started Augmentin for 28 days total and Diflucan via NG tube for 7 days total.  ID has signed off at this time. Awaiting acceptance from nursing facility and then discharge back.   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 Patient was readmitted after discharge from hospital 6 weeks ago due to failed treatment for osteomyelitis. MRI showed worsening of infection/osteomyelitis with possible myositis. Blood culture showed no growth in 2 days. Infectious disease discontinued IV antibiotics started Augmentin and Diflucan via NG tube.  ID has signed off. He is  stable for discharge as soon as facility is read accept him back.  Essential hypertension Resume metoprolol tartrate 25 mg twice daily   Dementia, Alzheimer type  Continue Aricept via G-tube  Paroxysmal Atrial fibrillation Continue with metoprolol tartrate 25 mg twice daily. Not on anticoagulation                                                                                                                 DiabetesMellitus type II Diet controlled. Continue fingersticks twice daily to monitor for potential hypoglycemic event.  History of CVA History of seizures Continue with Keppra and Depakote.  History of neurosyphilis treated in 2014.  Stable.   No intervention at this time  Failure to thrive adult. Tube feeding with increased residual of 200 cc Decrease tube feeding to 45 cc/h and monitor residual.  Discharge Instructions: continue PO Augmentin x 28 days total from start date and Diflucan for 7 days from start date.  Discharge Instructions    Diet - low sodium heart healthy   Complete by: As directed    Increase activity slowly   Complete by: As directed      Allergies as of 01/14/2019   No Known Allergies     Medication List  STOP taking these medications   cefTRIAXone  IVPB Commonly known as: ROCEPHIN   metroNIDAZOLE 50 mg/ml oral suspension Commonly known as: FLAGYL   vancomycin  IVPB     TAKE these medications   acetaminophen 325 MG tablet Commonly known as: TYLENOL Take 2 tablets (650 mg total) by mouth every 6 (six) hours as needed for mild pain (or Fever >/= 101).   amLODipine 5 MG tablet Commonly known as: NORVASC Take 1 tablet (5 mg total) by mouth daily.   amoxicillin-clavulanate 875-125 MG tablet Commonly known as: AUGMENTIN Take 1 tablet by mouth every 12 (twelve) hours for 28 days.   aspirin 81 MG chewable tablet Chew 81 mg by mouth daily.   CERTA-VITE PO Give 1 tablet by tube daily.   collagenase ointment Commonly  known as: SANTYL Apply topically daily. Apply to sacral wound daily   donepezil 10 MG tablet Commonly known as: ARICEPT Place 10 mg into feeding tube at bedtime.   feeding supplement (PRO-STAT SUGAR FREE 64) Liqd Take 30 mLs by mouth 2 (two) times a day.   fluconazole 40 MG/ML suspension Commonly known as: DIFLUCAN Take 2.5 mLs (100 mg total) by mouth daily for 7 days.   ipratropium-albuterol 0.5-2.5 (3) MG/3ML Soln Commonly known as: DUONEB Take 3 mLs by nebulization every 6 (six) hours as needed (for shortness of breath or wheezing).   lactulose 10 GM/15ML solution Commonly known as: CHRONULAC Take 20 g by mouth daily as needed for moderate constipation.   levETIRAcetam 100 MG/ML solution Commonly known as: KEPPRA Place 750 mg into feeding tube 2 (two) times daily.   metoprolol tartrate 25 MG tablet Commonly known as: LOPRESSOR Take 0.5 tablets (12.5 mg total) by mouth 2 (two) times daily.   nystatin cream Commonly known as: MYCOSTATIN   polyethylene glycol 17 g packet Commonly known as: MIRALAX / GLYCOLAX Take 17 g by mouth 2 (two) times daily. What changed: how to take this   senna 8.6 MG Tabs tablet Commonly known as: SENOKOT Take 1 tablet (8.6 mg total) by mouth 2 (two) times daily. What changed: how to take this   traMADol 50 MG tablet Commonly known as: ULTRAM Take 1 tablet (50 mg total) by mouth every 6 (six) hours as needed for moderate pain.   valproic acid 250 MG/5ML solution Commonly known as: DEPAKENE Place 125 mg into feeding tube 2 (two) times daily.      Contact information for after-discharge care    Destination    [facility] .   Service: Skilled Nursing Contact information: [address] [place] [id number] [phone]             No Known Allergies  Consultations:  Infectious Disease   Procedures/Studies: Dg Pelvis Portable  Result Date: 01/07/2019 CLINICAL DATA:  Large sacral decubitus  ulcer. EXAM: PORTABLE PELVIS 1-2 VIEWS COMPARISON:  CT abdomen and pelvis from 11/22/2017 FINDINGS: Assessment limited by overlying bowel gas with respect to this bony sacrum. Periosteal reaction and bony destruction exhibited on recent CT and MRI not well evaluated. Rotation of the current exam mildly limits assessment. No additional acute bone process. IMPRESSION: Limited assessment in the area of concern in this patient with proven osteomyelitis of the sacrococcygeal region. No additional acute process. Electronically Signed   By: Donzetta KohutGeoffrey  Wile M.D.   On: 01/07/2019 17:06   Mr Sacrum Si Joints Wo Contrast  Result Date: 01/08/2019 CLINICAL DATA:  Pelvic osteomyelitis, decubitus ulcer. EXAM: MRI PELVIS WITHOUT CONTRAST  TECHNIQUE: Multiplanar multisequence MR imaging of the pelvis was performed. No intravenous contrast was administered. Sacral protocol was utilized. COMPARISON:  Sacral MRI from 11/05/2018 and CT pelvis from 11/23/2018 FINDINGS: Urinary Tract:  Urinary bladder unremarkable where included. Bowel:  Unremarkable where included. Vascular/Lymphatic: Unremarkable where included. Reproductive:  Unremarkable where included. Other:  No supplemental non-categorized findings. Musculoskeletal: Large right paracentral sacral decubitus ulcer. The ulceration extends to the right periosteal margin in the vicinity of the sacrococcygeal junction. Along the deep margin there is worsened abnormal edema in the S5 sacral segment and in the coccyx, with new bony discontinuity at the sacrococcygeal junction compatible with bony destruction or fracture. Once again there is abnormal edema signal along the deep margins of the iliacus muscle, and in the gluteus maximus muscles, right greater than left, potentially from myositis. Worsened presacral fluid collection/edema, image 14/7. Increased fluid signal within and along the left obturator internus muscle, for example on images 20 through 24 of series 6, this area is not  fully included is today's exam was more focused on the sacrum. IMPRESSION: 1. Large right paracentral sacral decubitus ulcer extending to the periosteal margin of the sacrum near the sacrococcygeal junction, with worsening osteomyelitis of S5 and of the coccyx and new bony discontinuity at the sacrococcygeal junction compatible with bony destruction or fracture. 2. Similar appearance of edema signal along the deep margins of the iliacus muscles and in the gluteus maximus muscles, myositis is not excluded. 3. Worsened presacral fluid collection/edema. 4. Increased fluid signal within the left obturator internus muscle, favoring myositis. Electronically Signed   By: Gaylyn Rong M.D.   On: 01/08/2019 07:35   Dg Chest Port 1 View  Result Date: 01/07/2019 CLINICAL DATA:  Tachycardic and fatigue. EXAM: PORTABLE CHEST 1 VIEW COMPARISON:  11/23/2018 FINDINGS: Increased interstitial prominence and areas of lucency in the right upper lobe. Cardiomediastinal contours are stable. Increasing retrocardiac opacification and vascular congestion. No acute bone process. IMPRESSION: Increasing interstitial prominence may reflect mild asymmetric edema or pneumonitis superimposed on emphysematous changes. No signs of dense consolidation or pleural effusion. Electronically Signed   By: Donzetta Kohut M.D.   On: 01/07/2019 17:03   (Echo, Carotid, EGD, Colonoscopy, ERCP)    Subjective:   Discharge Exam: Vitals:   01/14/19 0158 01/14/19 0424  BP: (!) 142/84 137/88  Pulse: 94 90  Resp: (!) 21 (!) 22  Temp: 98.2 F (36.8 C) 98.9 F (37.2 C)  SpO2: 92% 91%   Vitals:   01/13/19 2023 01/14/19 0158 01/14/19 0341 01/14/19 0424  BP: (!) 145/100 (!) 142/84  137/88  Pulse: 91 94  90  Resp: 20 (!) 21  (!) 22  Temp: 97.8 F (36.6 C) 98.2 F (36.8 C)  98.9 F (37.2 C)  TempSrc: Oral Oral  Oral  SpO2: 95% 92%  91%  Weight:   68.7 kg   Height:        General exam: Appears calm and comfortable.   Appears  chronically ill Respiratory system: Decreased bibasilar breath sounds.  No rhonchi, rales . Cardiovascular system: S1 & S2 heard, RRR. No JVD, murmurs, rubs, gallops or clicks. No pedal edema. Gastrointestinal system: Nontender nondistended.  G-tube noted Central nervous system: Alert to his name only.  Bilateral upper extremity contractures.  Fixed flexion contractures. . Extremities: Bilateral upper extremity contractures .Marland Kitchen Skin: Stage IV sacral decubitus ulcer with surrounding erythema Psychiatry: Aggressive response to questions.  Dementia with significant cognitive impairment.    The results of significant diagnostics from  this hospitalization (including imaging, microbiology, ancillary and laboratory) are listed below for reference.     Microbiology: Recent Results (from the past 240 hour(s))  Blood Culture (routine x 2)     Status: None   Collection Time: 01/07/19  4:15 PM   Specimen: BLOOD RIGHT FOREARM  Result Value Ref Range Status   Specimen Description BLOOD RIGHT FOREARM  Final   Special Requests   Final    BOTTLES DRAWN AEROBIC AND ANAEROBIC Blood Culture adequate volume   Culture   Final    NO GROWTH 5 DAYS Performed at [facility], [address], [place], [place] [address]    Report Status 01/12/2019 FINAL  Final  SARS CORONAVIRUS 2 (TAT 6-24 HRS) Nasopharyngeal Nasopharyngeal Swab     Status: None   Collection Time: 01/07/19  4:22 PM   Specimen: Nasopharyngeal Swab  Result Value Ref Range Status   SARS Coronavirus 2 NEGATIVE NEGATIVE Final    Comment: (NOTE) SARS-CoV-2 target nucleic acids are NOT DETECTED. The SARS-CoV-2 RNA is generally detectable in upper and lower respiratory specimens during the acute phase of infection. Negative results do not preclude SARS-CoV-2 infection, do not rule out co-infections with other pathogens, and should not be used as the sole basis for treatment or other patient management decisions. Negative results must be  combined with clinical observations, patient history, and epidemiological information. The expected result is Negative. Fact Sheet for Patients: HairSlick.no Fact Sheet for Healthcare Providers: quierodirigir.com This test is not yet approved or cleared by the [place] FDA and  has been authorized for detection and/or diagnosis of SARS-CoV-2 by FDA under an Emergency Use Authorization (EUA). This EUA will remain  in effect (meaning this test can be used) for the duration of the COVID-19 declaration under Section 56 4(b)(1) of the Act, 21 U.S.C. section 360bbb-3(b)(1), unless the authorization is terminated or revoked sooner. Performed at [facility], [address], [place], [place] [address]   Blood Culture (routine x 2)     Status: None   Collection Time: 01/07/19  4:24 PM   Specimen: BLOOD RIGHT WRIST  Result Value Ref Range Status   Specimen Description BLOOD RIGHT WRIST  Final   Special Requests   Final    BOTTLES DRAWN AEROBIC AND ANAEROBIC Blood Culture adequate volume   Culture   Final    NO GROWTH 5 DAYS Performed at [facility], [address] [place], [place] [address]    Report Status 01/12/2019 FINAL  Final  Urine culture     Status: Abnormal   Collection Time: 01/07/19  7:10 PM   Specimen: In/Out Cath Urine  Result Value Ref Range Status   Specimen Description IN/OUT CATH URINE  Final   Special Requests   Final    NONE Performed at [facility], [address], [place], [place] [address]    Culture (A)  Final    2,000 COLONIES/mL PSEUDOMONAS AERUGINOSA 2,000 COLONIES/mL ENTEROBACTER CLOACAE    Report Status 01/11/2019 FINAL  Final   Organism ID, Bacteria PSEUDOMONAS AERUGINOSA (A)  Final   Organism ID, Bacteria ENTEROBACTER CLOACAE (A)  Final      Susceptibility   Enterobacter cloacae - MIC*    CEFAZOLIN >=64 RESISTANT Resistant     CEFTRIAXONE >=64 RESISTANT Resistant      CIPROFLOXACIN <=0.25 SENSITIVE Sensitive     GENTAMICIN <=1 SENSITIVE Sensitive     IMIPENEM 0.5 SENSITIVE Sensitive     NITROFURANTOIN  32 SENSITIVE Sensitive     TRIMETH/SULFA <=20 SENSITIVE Sensitive     PIP/TAZO >=128 RESISTANT Resistant     * 2,000 COLONIES/mL ENTEROBACTER CLOACAE   Pseudomonas aeruginosa - MIC*    CEFTAZIDIME >=64 RESISTANT Resistant     CIPROFLOXACIN <=0.25 SENSITIVE Sensitive     GENTAMICIN <=1 SENSITIVE Sensitive     IMIPENEM 2 SENSITIVE Sensitive     CEFEPIME >=64 RESISTANT Resistant     * 2,000 COLONIES/mL PSEUDOMONAS AERUGINOSA  MRSA PCR Screening     Status: None   Collection Time: 01/09/19  4:55 AM   Specimen: Nasal Mucosa; Nasopharyngeal  Result Value Ref Range Status   MRSA by PCR NEGATIVE NEGATIVE Final    Comment:        The GeneXpert MRSA Assay (FDA approved for NASAL specimens only), is one component of a comprehensive MRSA colonization surveillance program. It is not intended to diagnose MRSA infection nor to guide or monitor treatment for MRSA infections. Performed at [facility], [place] [address], [place], [place] [address]   SARS CORONAVIRUS 2 (TAT 6-24 HRS) Nasopharyngeal Nasopharyngeal Swab     Status: None   Collection Time: 01/13/19 12:28 PM   Specimen: Nasopharyngeal Swab  Result Value Ref Range Status   SARS Coronavirus 2 NEGATIVE NEGATIVE Final    Comment: (NOTE) SARS-CoV-2 target nucleic acids are NOT DETECTED. The SARS-CoV-2 RNA is generally detectable in upper and lower respiratory specimens during the acute phase of infection. Negative results do not preclude SARS-CoV-2 infection, do not rule out co-infections with other pathogens, and should not be used as the sole basis for treatment or other patient management decisions. Negative results must be combined with clinical observations, patient history, and epidemiological information. The expected result is Negative. Fact Sheet for  Patients: SugarRoll.be Fact Sheet for Healthcare Providers: https://www.woods-mathews.com/ This test is not yet approved or cleared by the [place] FDA and  has been authorized for detection and/or diagnosis of SARS-CoV-2 by FDA under an Emergency Use Authorization (EUA). This EUA will remain  in effect (meaning this test can be used) for the duration of the COVID-19 declaration under Section 56 4(b)(1) of the Act, 21 U.S.C. section 360bbb-3(b)(1), unless the authorization is terminated or revoked sooner. Performed at [facility], [place] [address], [place], [place] [address]      Labs: BNP (last 3 results) No results for input(s): BNP in the last 8760 hours. Basic Metabolic Panel: Recent Labs  Lab 01/08/19 0439 01/10/19 0533 01/11/19 0355 01/12/19 0407 01/13/19 0441 01/14/19 0356  NA 141 140 139 137 137  --   K 3.9 4.1 3.9 4.3 4.4  --   CL 106 107 106 105 103  --   CO2 25 25 24 24 24   --   GLUCOSE 90 126* 122* 125* 114*  --   BUN 11 6* 8 8 11   --   CREATININE 0.66 0.71 0.74 0.69 0.67 0.67  CALCIUM 8.8* 8.9 8.6* 8.9 9.1  --   MG  --   --  1.9 1.9 2.1  --   PHOS  --   --  3.1  --  3.1  --    Liver Function Tests: Recent Labs  Lab 01/07/19 1624 01/08/19 0439 01/11/19 0355 01/12/19 0407 01/13/19 0441  AST 27 23 19 23 24   ALT 15 14 11 11 13   ALKPHOS 75 54 47 52 57  BILITOT 0.5 0.5 0.4 0.3 0.6  PROT 8.2* 7.1 6.9 7.4 7.7  ALBUMIN 2.8* 2.5* 2.4* 2.5* 2.7*   No results for input(s): LIPASE, AMYLASE in the last 168 hours. No results for input(s): AMMONIA in the last 168 hours. CBC: Recent Labs  Lab 01/07/19 1624 01/08/19 0049 01/08/19 0439 01/11/19 0355 01/12/19 0407 01/13/19 0441  WBC 6.2 6.6 5.9 5.3 5.1 4.9  NEUTROABS 3.5  --   --   --   --   --   HGB 13.7 12.9* 13.0 12.4* 13.0 13.7  HCT 45.0 40.6 41.6 40.2 41.4 43.4  MCV 98.5 96.0 96.7 98.3 95.4 95.8  PLT 250 223 228 230 234 251   Cardiac Enzymes: No  results for input(s): CKTOTAL, CKMB, CKMBINDEX, TROPONINI in the last 168 hours. BNP: Invalid input(s): POCBNP CBG: Recent Labs  Lab 01/13/19 1602 01/13/19 1959 01/13/19 2355 01/14/19 0400 01/14/19 0720  GLUCAP 120* 110* 120* 102* 134*   D-Dimer No results for input(s): DDIMER in the last 72 hours. Hgb A1c No results for input(s): HGBA1C in the last 72 hours. Lipid Profile No results for input(s): CHOL, HDL, LDLCALC, TRIG, CHOLHDL, LDLDIRECT in the last 72 hours. Thyroid function studies No results for input(s): TSH, T4TOTAL, T3FREE, THYROIDAB in the last 72 hours.  Invalid input(s): FREET3 Anemia work up No results for input(s): VITAMINB12, FOLATE, FERRITIN, TIBC, IRON, RETICCTPCT in the last 72 hours. Urinalysis    Component Value Date/Time   COLORURINE YELLOW 01/07/2019 1910   APPEARANCEUR CLEAR 01/07/2019 1910   LABSPEC 1.020 01/07/2019 1910   PHURINE 6.0 01/07/2019 1910   GLUCOSEU NEGATIVE 01/07/2019 1910   HGBUR NEGATIVE 01/07/2019 1910   BILIRUBINUR NEGATIVE 01/07/2019 1910   KETONESUR NEGATIVE 01/07/2019 1910   PROTEINUR 30 (A) 01/07/2019 1910   UROBILINOGEN 0.2 06/03/2014 1649   NITRITE NEGATIVE 01/07/2019 1910   LEUKOCYTESUR LARGE (A) 01/07/2019 1910   Sepsis Labs Invalid input(s): PROCALCITONIN,  WBC,  LACTICIDVEN Microbiology Recent Results (from the past 240 hour(s))  Blood Culture (routine x 2)     Status: None   Collection Time: 01/07/19  4:15 PM   Specimen: BLOOD RIGHT FOREARM  Result Value Ref Range Status   Specimen Description BLOOD RIGHT FOREARM  Final   Special Requests   Final    BOTTLES DRAWN AEROBIC AND ANAEROBIC Blood Culture adequate volume   Culture   Final    NO GROWTH 5 DAYS Performed at [facility], [address] [place], [place] [address]    Report Status 01/12/2019 FINAL  Final  SARS CORONAVIRUS 2 (TAT 6-24 HRS) Nasopharyngeal Nasopharyngeal Swab     Status: None   Collection Time: 01/07/19  4:22 PM   Specimen:  Nasopharyngeal Swab  Result Value Ref Range Status   SARS Coronavirus 2 NEGATIVE NEGATIVE Final    Comment: (NOTE) SARS-CoV-2 target nucleic acids are NOT DETECTED. The SARS-CoV-2 RNA is generally detectable in upper and lower respiratory specimens during the acute phase of infection. Negative results do not preclude SARS-CoV-2 infection, do not rule out co-infections with other pathogens, and should not be used as the sole basis for treatment or other patient management decisions. Negative results must be combined with clinical observations, patient history, and epidemiological information. The expected result is Negative. Fact Sheet for Patients: HairSlick.no Fact Sheet for Healthcare Providers: quierodirigir.com This test is not yet approved or cleared by the [place] FDA and  has been authorized for detection and/or diagnosis of SARS-CoV-2 by FDA under an Emergency Use Authorization (EUA). This EUA will remain  in effect (meaning this test can  be used) for the duration of the COVID-19 declaration under Section 56 4(b)(1) of the Act, 21 U.S.C. section 360bbb-3(b)(1), unless the authorization is terminated or revoked sooner. Performed at [facility], [address], [place], [place] [address]   Blood Culture (routine x 2)     Status: None   Collection Time: 01/07/19  4:24 PM   Specimen: BLOOD RIGHT WRIST  Result Value Ref Range Status   Specimen Description BLOOD RIGHT WRIST  Final   Special Requests   Final    BOTTLES DRAWN AEROBIC AND ANAEROBIC Blood Culture adequate volume   Culture   Final    NO GROWTH 5 DAYS Performed at [facility], [address], [place], [place] [address]    Report Status 01/12/2019 FINAL  Final  Urine culture     Status: Abnormal   Collection Time: 01/07/19  7:10 PM   Specimen: In/Out Cath Urine  Result Value Ref Range Status   Specimen Description IN/OUT CATH URINE  Final    Special Requests   Final    NONE Performed at [facility], [address], [place], [place] [address]    Culture (A)  Final    2,000 COLONIES/mL PSEUDOMONAS AERUGINOSA 2,000 COLONIES/mL ENTEROBACTER CLOACAE    Report Status 01/11/2019 FINAL  Final   Organism ID, Bacteria PSEUDOMONAS AERUGINOSA (A)  Final   Organism ID, Bacteria ENTEROBACTER CLOACAE (A)  Final      Susceptibility   Enterobacter cloacae - MIC*    CEFAZOLIN >=64 RESISTANT Resistant     CEFTRIAXONE >=64 RESISTANT Resistant     CIPROFLOXACIN <=0.25 SENSITIVE Sensitive     GENTAMICIN <=1 SENSITIVE Sensitive     IMIPENEM 0.5 SENSITIVE Sensitive     NITROFURANTOIN 32 SENSITIVE Sensitive     TRIMETH/SULFA <=20 SENSITIVE Sensitive     PIP/TAZO >=128 RESISTANT Resistant     * 2,000 COLONIES/mL ENTEROBACTER CLOACAE   Pseudomonas aeruginosa - MIC*    CEFTAZIDIME >=64 RESISTANT Resistant     CIPROFLOXACIN <=0.25 SENSITIVE Sensitive     GENTAMICIN <=1 SENSITIVE Sensitive     IMIPENEM 2 SENSITIVE Sensitive     CEFEPIME >=64 RESISTANT Resistant     * 2,000 COLONIES/mL PSEUDOMONAS AERUGINOSA  MRSA PCR Screening     Status: None   Collection Time: 01/09/19  4:55 AM   Specimen: Nasal Mucosa; Nasopharyngeal  Result Value Ref Range Status   MRSA by PCR NEGATIVE NEGATIVE Final    Comment:        The GeneXpert MRSA Assay (FDA approved for NASAL specimens only), is one component of a comprehensive MRSA colonization surveillance program. It is not intended to diagnose MRSA infection nor to guide or monitor treatment for MRSA infections. Performed at [facility], [address], [place], [place] [address]   SARS CORONAVIRUS 2 (TAT 6-24 HRS) Nasopharyngeal Nasopharyngeal Swab     Status: None   Collection Time: 01/13/19 12:28 PM   Specimen: Nasopharyngeal Swab  Result Value Ref Range Status   SARS Coronavirus 2 NEGATIVE NEGATIVE Final    Comment: (NOTE) SARS-CoV-2 target nucleic acids are NOT DETECTED. The  SARS-CoV-2 RNA is generally detectable in upper and lower respiratory specimens during the acute phase of infection. Negative results do not preclude SARS-CoV-2 infection, do not rule out co-infections with other pathogens, and should not be used as the sole basis for treatment or other patient management decisions. Negative results must be combined with clinical observations, patient history, and  epidemiological information. The expected result is Negative. Fact Sheet for Patients: HairSlick.no Fact Sheet for Healthcare Providers: quierodirigir.com This test is not yet approved or cleared by the [place] FDA and  has been authorized for detection and/or diagnosis of SARS-CoV-2 by FDA under an Emergency Use Authorization (EUA). This EUA will remain  in effect (meaning this test can be used) for the duration of the COVID-19 declaration under Section 56 4(b)(1) of the Act, 21 U.S.C. section 360bbb-3(b)(1), unless the authorization is terminated or revoked sooner. Performed at [facility] [facility], [address], [place], [place] [address]      Time coordinating discharge: Over 30 minutes  SIGNED:   Rae Halsted, MD  [facility] 01/14/2019, 10:43 AM   If 7PM-7AM, please contact night-coverage [email or web address] Password [email or web address]

## 2019-01-14 LAB — GLUCOSE, CAPILLARY
Glucose-Capillary: 102 mg/dL — ABNORMAL HIGH (ref 70–99)
Glucose-Capillary: 134 mg/dL — ABNORMAL HIGH (ref 70–99)
Glucose-Capillary: 127 mg/dL — ABNORMAL HIGH (ref 70–99)

## 2019-01-14 LAB — CREATININE, SERUM
Creatinine, Ser: 0.67 mg/dL (ref 0.61–1.24)
GFR calc Af Amer: >60 mL/min (ref >60)
GFR calc non Af Amer: >60 mL/min (ref >60)

## 2019-01-14 MED ORDER — PHENYLEPHRINE HCL-NACL 10-0.9 MG/250ML-% IV SOLN
INTRAVENOUS | Status: AC
  Filled 2019-01-14: qty 1000

## 2019-01-14 MED ORDER — TRAMADOL HCL 50 MG PO TABS: 50.0000 mg | ORAL_TABLET | Freq: Four times a day (QID) | ORAL | 0 refills | Status: DC | PRN

## 2019-01-14 NOTE — Progress Notes (Signed)
Report given to [facility]

## 2019-01-14 NOTE — TOC Transition Note (Signed)
Transition of Care [facility]) - CM/SW Discharge Note   Patient Details  Name: Joshua Gonzales MRN: [id number] Date of Birth: 1941/02/25  [facility] CM/SW Contact:  Alberteen Sam, LCSW Phone Number: 01/14/2019, 8:53 AM   Clinical Narrative:     Patient will DC to: [place] Anticipated DC date: 01/14/2019 Family notified: Abigail Butts Transport by: Corey Harold  Per MD patient ready for DC to [place], patient, patient's family, and facility notified of DC. Discharge Summary sent to facility. RN given number for report [phone]  Room [place] packet on chart. Ambulance transport requested for patient.  CSW signing off.  [place], [place]   Final next level of care: [place] Barriers to Discharge: No Barriers Identified   Patient Goals and CMS Choice Patient states their goals for this hospitalization and ongoing recovery are:: to go back to [email or web address] Compare Post Acute Care list provided to:: Patient Represenative (must comment)(Wendy) Choice offered to / list presented to : Adult Children(niece Abigail Butts)  Discharge Placement PASRR number recieved: 01/10/19            Patient chooses bed at: [facility] ([facility]) Patient to be transferred to facility by: [place] Name of family member notified: Abigail Butts Patient and family notified of of transfer: 01/14/19  Discharge Plan and Services In-house Referral: Clinical Social Work Discharge Planning Services: NA Post Acute Care Choice: [place]          DME Arranged: N/A DME Agency: NA       HH Arranged: NA HH Agency: NA        Social Determinants of Health (SDOH) Interventions     Readmission Risk Interventions No flowsheet data found.

## 2019-01-14 NOTE — Care Management Important Message (Signed)
Important Message  Patient Details  Name: Joshua Gonzales MRN: [id number] Date of Birth: 03/04/1941   Medicare Important Message Given:  Yes     Shelda Altes 01/14/2019, 1:17 PM

## 2019-02-11 NOTE — Progress Notes (Signed)
Joshua Gonzales, Joshua Gonzales ([id number]) Visit Report for 12/20/2018 Chief Complaint Document Details Patient Name: Date of Service: Joshua Gonzales, Joshua Gonzales 12/20/2018 9:00 AM Medical Record [id number] Patient Account Number: [id number] Date of Birth/Sex: 01/16/1941 (78 y.o. Male) Treating RN: Zandra Abts Primary Care Provider: Katy Apo Other Clinician: Referring Provider: Treating Provider/Extender:, Almira Coaster, Chalmers Guest in Treatment: 0 Information Obtained from: Patient Chief Complaint 12/20/2018; patient is here for review of a sacral decubitus ulcer pressure areas on both heels Electronic Signature(s) Signed: 12/20/2018 5:33:39 PM By: Baltazar Najjar MD Entered By: Baltazar Najjar on 12/20/2018 11:15:30 -------------------------------------------------------------------------------- HPI Details Patient Name: Date of Service: Joshua Gonzales, Joshua Gonzales 12/20/2018 9:00 AM Medical Record [id number] Patient Account Number: [id number] Date of Birth/Sex: Mar 17, 1940 (78 y.o. Male) Treating RN: Zandra Abts Primary Care Provider: Katy Apo Other Clinician: Referring Provider: Treating Provider/Extender:, Almira Coaster, Deirdre Peer Weeks in Treatment: 0 History of Present Illness HPI Description: ADMISSION 12/20/2018 This is a 77 year old man who is a resident at [facility] skilled facility. He has a large sacral wound on his lower sacrum. He was admitted to hospital from 9/19 through 9/24. At that point he had already been started on vancomycin. Admitted to hospital with an infected sacral decubitus ulcer, delirium in the setting of background Alzheimer's disease. Blood cultures grew coag negative staph thought to be a contaminant. He was seen by infectious disease and started on vancomycin Rocephin and Flagyl. From review his MAR it would appear that he is still on vancomycin and Rocephin till 01/07/2019. He is supposed to be having weekly lab work  including a CBC basic metabolic panel C-reactive protein and sedimentation rate although I see none of this; health link and I did not receive any of it. MRI of the pelvis done on 12/20/2018 showed midline decubitus over the sacrococcygeal junction with underlying osteomyelitis. I also note that he had a MRI showing an ulcer in the same area in 2018 but without osteomyelitis. Looking back through over the course of this year I cannot see reference to the ulcer therefore this probably healed over and reopened. Other issues include an albumin of 2.9 on 11/24/2018. He also has pressure areas on his heels stage II on the right lateral heel and a DTI on the left heel. These look satisfactory and should respond to simple offloading. Past medical history; Alzheimer's disease, hypertension, CVA, neurosyphilis, seizure disorder, hypertension, paroxysmal atrial fibrillation. The patient has a G-tube in place. ABIs in our clinic were noncompressible Electronic Signature(s) Signed: 12/20/2018 5:33:39 PM By: Baltazar Najjar MD Entered By: Baltazar Najjar on 12/20/2018 11:19:55 -------------------------------------------------------------------------------- Physical Exam Details Patient Name: Date of Service: Joshua Gonzales, Joshua Gonzales 12/20/2018 9:00 AM Medical Record [id number] Patient Account Number: [id number] Date of Birth/Sex: 08-09-1940 (78 y.o. Male) Treating RN: Zandra Abts Primary Care Provider: Katy Apo Other Clinician: Referring Provider: Treating Provider/Extender:, Almira Coaster, Deirdre Peer Weeks in Treatment: 0 Constitutional Sitting or standing Blood Pressure is within target range for patient.. Pulse regular and within target range for patient.Marland Kitchen Respirations regular, non-labored and within target range.. Temperature is normal and within the target range for the patient.Marland Kitchen Appears in no distress. Eyes Conjunctivae clear. No discharge.no icterus. Ears, Nose, Mouth, and  Throat Mucous membranes were moist. Cardiovascular Calcium dorsalis pedis pulses are palpable. Gastrointestinal (GI) G-tube in place. No masses. No liver or spleen enlargement. Genitourinary (GU) Bladder not distended there is no Foley catheter. Musculoskeletal Flexion contractures bilateral hips and knees. Integumentary (Hair, Skin) Significant erythema and a rectangular distribution up his  back. I think this is probably some form of contact dermatitis. It was not tender not cellulitis. Psychiatric Patient is verbal but significant dementia. 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 They are using Santyl on the wound area but I do not think this is necessary. Superficial stage II wounds on his lateral right heel. DTI on the left heel. Neither 1 of these areas look infected or ominous Electronic Signature(s) Signed: 12/20/2018 5:33:39 PM By: Baltazar Najjar MD Entered By: Baltazar Najjar on 12/20/2018 11:22:42 -------------------------------------------------------------------------------- Physician Orders Details Patient Name: Date of Service: Joshua Gonzales, Joshua Gonzales 12/20/2018 9:00 AM Medical Record [id number] Patient Account Number: [id number] Date of Birth/Sex: 07-19-1940 (78 y.o. Male) Treating RN: Zandra Abts Primary Care Provider: Katy Apo Other Clinician: Referring Provider: Treating Provider/Extender:, Almira Coaster, Chalmers Guest in Treatment: 0 Verbal / Phone Orders: No Diagnosis Coding Follow-up Appointments Return appointment in 3 weeks. - ***ROOM 5 - STRETCHER*** Dressing Change Frequency Wound #1 Right Calcaneus Change Dressing every other day. Wound #2 Sacrum Change Dressing every other day. Skin Barriers/Peri-Wound Care Wound #2 Sacrum Skin Prep Barrier cream Wound Cleansing Wound #1 Right Calcaneus Clean wound with Normal Saline. - or wound cleanser Wound #2  Sacrum Clean wound with Normal Saline. - or wound cleanser Primary Wound Dressing Wound #1 Right Calcaneus Silver Collagen - moisten with saline or hydrogel Wound #2 Sacrum Silver Collagen - moisten with saline or hydrogel. Fill space with saline moistened gauze. Secondary Dressing Wound #1 Right Calcaneus Kerlix/Rolled Gauze Dry Gauze Heel Cup Wound #2 Sacrum ABD pad - secure with tape Off-Loading Low air-loss mattress (Group 2) Turn and reposition every 2 hours Electronic Signature(s) Signed: 12/20/2018 5:33:39 PM By: Baltazar Najjar MD Signed: 12/20/2018 6:00:55 PM By: Zandra Abts RN, BSN Entered By: Zandra Abts on 12/20/2018 10:51:48 -------------------------------------------------------------------------------- Problem List Details Patient Name: Date of Service: Joshua Gonzales, Joshua Gonzales 12/20/2018 9:00 AM Medical Record [id number] Patient Account Number: [id number] Date of Birth/Sex: [birth year] (78 y.o. Male) Treating RN: Zandra Abts Primary Care Provider: Katy Apo Other Clinician: Referring Provider: Treating Provider/Extender:, Almira Coaster, Deirdre Peer Weeks in Treatment: 0 Active Problems ICD-10 Evaluated Encounter Code Description Active Date Today Diagnosis L89.154 Pressure ulcer of sacral region, stage 4 12/20/2018 No Yes M86.68 Other chronic osteomyelitis, other site 12/20/2018 No Yes L89.612 Pressure ulcer of right heel, stage 2 12/20/2018 No Yes L89.626 Pressure-induced deep tissue damage of left heel 12/20/2018 No Yes Inactive Problems Resolved Problems Electronic Signature(s) Signed: 12/20/2018 5:33:39 PM By: Baltazar Najjar MD Entered By: Baltazar Najjar on 12/20/2018 10:57:41 -------------------------------------------------------------------------------- Progress Note Details Patient Name: Date of Service: Joshua Gonzales 12/20/2018 9:00 AM Medical Record [id number] Patient Account Number: [id number] Date of  Birth/Sex: March 14, 1940 (78 y.o. Male) Treating RN: Zandra Abts Primary Care Provider: Katy Apo Other Clinician: Referring Provider: Treating Provider/Extender:, Almira Coaster, Deirdre Peer Weeks in Treatment: 0 Subjective Chief Complaint Information obtained from Patient 12/20/2018; patient is here for review of a sacral decubitus ulcer pressure areas on both heels History of Present Illness (HPI) ADMISSION 12/20/2018 This is a 78 year old man who is a resident at [facility] skilled facility. He has a large sacral wound on his lower sacrum. He was admitted to hospital from 9/19 through 9/24. At that point he had already been started on vancomycin. Admitted to hospital with an infected sacral decubitus ulcer, delirium in the setting of background Alzheimer's disease. Blood cultures grew coag negative staph thought to  be a contaminant. He was seen by infectious disease and started on vancomycin Rocephin and Flagyl. From review his MAR it would appear that he is still on vancomycin and Rocephin till 01/07/2019. He is supposed to be having weekly lab work including a CBC basic metabolic panel C-reactive protein and sedimentation rate although I see none of this; health link and I did not receive any of it. MRI of the pelvis done on 12/20/2018 showed midline decubitus over the sacrococcygeal junction with underlying osteomyelitis. I also note that he had a MRI showing an ulcer in the same area in 2018 but without osteomyelitis. Looking back through over the course of this year I cannot see reference to the ulcer therefore this probably healed over and reopened. Other issues include an albumin of 2.9 on 11/24/2018. He also has pressure areas on his heels stage II on the right lateral heel and a DTI on the left heel. These look satisfactory and should respond to simple offloading. Past medical history; Alzheimer's disease, hypertension, CVA, neurosyphilis, seizure disorder,  hypertension, paroxysmal atrial fibrillation. The patient has a G-tube in place. ABIs in our clinic were noncompressible Patient History Unable to Obtain Patient History due to Altered Mental Status. Allergies No Known Allergies Family History No family history of Cancer, Diabetes, Heart Disease, Hereditary Spherocytosis, Hypertension, Kidney Disease, Lung Disease, Seizures, Stroke, Thyroid Problems, Tuberculosis. Social History Never smoker, Marital Status - Single, Alcohol Use - Never, Drug Use - No History, Caffeine Use - Never. Medical History Eyes Denies history of Cataracts, Glaucoma, Optic Neuritis Ear/Nose/Mouth/Throat Denies history of Chronic sinus problems/congestion, Middle ear problems Hematologic/Lymphatic Denies history of Anemia, Hemophilia, Human Immunodeficiency Virus, Lymphedema, Sickle Cell Disease Respiratory Denies history of Aspiration, Asthma, Chronic Obstructive Pulmonary Disease (COPD), Pneumothorax, Sleep Apnea, Tuberculosis Cardiovascular Patient has history of Hypertension Denies history of Angina, Arrhythmia, Congestive Heart Failure, Coronary Artery Disease, Deep Vein Thrombosis, Hypotension, Myocardial Infarction, Peripheral Arterial Disease, Peripheral Venous Disease, Phlebitis, Vasculitis Gastrointestinal Denies history of Cirrhosis , Colitis, Crohnoos, Hepatitis A, Hepatitis B, Hepatitis C Endocrine Denies history of Type I Diabetes, Type II Diabetes Genitourinary Denies history of End Stage Renal Disease Immunological Denies history of Lupus Erythematosus, Raynaudoos, Scleroderma Integumentary (Skin) Denies history of History of Burn Musculoskeletal Patient has history of Osteomyelitis Denies history of Gout, Rheumatoid Arthritis, Osteoarthritis Neurologic Patient has history of Dementia Denies history of Neuropathy, Quadriplegia, Paraplegia, Seizure Disorder Oncologic Denies history of Received Chemotherapy, Received  Radiation Psychiatric Denies history of Anorexia/bulimia, Confinement Anxiety Review of Systems (ROS) Constitutional Symptoms (General Health) Denies complaints or symptoms of Fatigue, Fever, Chills, Marked Weight Change. Eyes Denies complaints or symptoms of Dry Eyes, Vision Changes, Glasses / Contacts. Ear/Nose/Mouth/Throat Denies complaints or symptoms of Chronic sinus problems or rhinitis. Respiratory Denies complaints or symptoms of Chronic or frequent coughs, Shortness of Breath. Cardiovascular Denies complaints or symptoms of Chest pain. Gastrointestinal Denies complaints or symptoms of Frequent diarrhea, Nausea, Vomiting. Endocrine Denies complaints or symptoms of Heat/cold intolerance. Genitourinary Denies complaints or symptoms of Frequent urination. Integumentary (Skin) Complains or has symptoms of Wounds. Musculoskeletal Denies complaints or symptoms of Muscle Pain, Muscle Weakness. Neurologic Denies complaints or symptoms of Numbness/parasthesias. Psychiatric Denies complaints or symptoms of Claustrophobia, Suicidal. Objective Constitutional Sitting or standing Blood Pressure is within target range for patient.. Pulse regular and within target range for patient.Marland Kitchen Respirations regular, non-labored and within target range.. Temperature is normal and within the target range for the patient.Marland Kitchen Appears in no distress. Vitals Time Taken: 9:51 AM, Height: 74 in,  Source: Stated, Weight: 165 lbs, BMI: 21.2, Temperature: 97.7 F, Pulse: 90 bpm, Respiratory Rate: 16 breaths/min, Blood Pressure: 124/80 mmHg. Eyes Conjunctivae clear. No discharge.no icterus. Ears, Nose, Mouth, and Throat Mucous membranes were moist. Cardiovascular Calcium dorsalis pedis pulses are palpable. Gastrointestinal (GI) G-tube in place. No masses. No liver or spleen enlargement. Genitourinary (GU) Bladder not distended there is no Foley catheter. Musculoskeletal Flexion contractures bilateral  hips and knees. Psychiatric Patient is verbal but significant dementia. 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 They are using Santyl on the wound area but I do not think this is necessary. ooSuperficial stage II wounds on his lateral right heel. DTI on the left heel. Neither 1 of these areas look infected or ominous Integumentary (Hair, Skin) Significant erythema and a rectangular distribution up his back. I think this is probably some form of contact dermatitis. It was not tender not cellulitis. Wound #1 status is Open. Original cause of wound was Blister. The wound is located on the Right Calcaneus. The wound measures 1.5cm length x 2.5cm width x 0.1cm depth; 2.945cm^2 area and 0.295cm^3 volume. There is Fat Layer (Subcutaneous Tissue) Exposed exposed. There is no tunneling or undermining noted. There is a medium amount of serosanguineous drainage noted. The wound margin is flat and intact. There is large (67-100%) pink granulation within the wound bed. There is a small (1-33%) amount of necrotic tissue within the wound bed including Adherent Slough. Wound #2 status is Open. Original cause of wound was Pressure Injury. The wound is located on the Sacrum. The wound measures 6cm length x 1.7cm width x 2.5cm depth; 8.011cm^2 area and 20.028cm^3 volume. There is bone, muscle, and Fat Layer (Subcutaneous Tissue) Exposed exposed. There is no tunneling noted, however, there is undermining starting at 12:00 and ending at 12:00 with a maximum distance of 3.6cm. There is a medium amount of serosanguineous drainage noted. There is large (67-100%) red, pink granulation within the wound bed. There is a small (1-33%) amount of necrotic tissue within the wound bed including Adherent Slough. Assessment Active Problems ICD-10 Pressure ulcer of sacral region, stage 4 Other chronic osteomyelitis, other site Pressure ulcer of  right heel, stage 2 Pressure-induced deep tissue damage of left heel Plan Follow-up Appointments: Return appointment in 3 weeks. - ***ROOM 5 - STRETCHER*** Dressing Change Frequency: Wound #1 Right Calcaneus: Change Dressing every other day. Wound #2 Sacrum: Change Dressing every other day. Skin Barriers/Peri-Wound Care: Wound #2 Sacrum: Skin Prep Barrier cream Wound Cleansing: Wound #1 Right Calcaneus: Clean wound with Normal Saline. - or wound cleanser Wound #2 Sacrum: Clean wound with Normal Saline. - or wound cleanser Primary Wound Dressing: Wound #1 Right Calcaneus: Silver Collagen - moisten with saline or hydrogel Wound #2 Sacrum: Silver Collagen - moisten with saline or hydrogel. Fill space with saline moistened gauze. Secondary Dressing: Wound #1 Right Calcaneus: Kerlix/Rolled Gauze Dry Gauze Heel Cup Wound #2 Sacrum: ABD pad - secure with tape Off-Loading: Low air-loss mattress (Group 2) Turn and reposition every 2 hours 1. I did not think Santyl was useful on the wound on the lower sacrum at this point changed to silver collagen packed with normal saline wet-to-dry 2. He has what appears to be a contact dermatitis area on his back above the wound and encompassing it. Rectangular not particularly tender. The exact etiology of this is unclear. 3. Stage II pressure ulcers on the right lateral heel and a  DTI on the left. Both of these look fairly benign 4. The patient is on IV Vanco and Rocephin up until November 3. I do not see any lab work to follow in [facility] health link. Did see Dr. Ilsa Iha once. I will see if I can get the facility to send me any lab work they have. Beside the C- reactive protein and sedimentation rate I would like to follow-up on his albumin level. 5. Pressure offloading is of course paramount in doing anything with this wound area. Could consider attempting a wound VAC once the IV antibiotics are finished Electronic Signature(s) Signed:  12/20/2018 5:33:39 PM By: Baltazar Najjar MD Entered By: Baltazar Najjar on 12/20/2018 11:25:29 -------------------------------------------------------------------------------- HxROS Details Patient Name: Date of Service: Joshua Gonzales, Joshua Gonzales 12/20/2018 9:00 AM Medical Record [id number] Patient Account Number: [id number] Date of Birth/Sex: 1940/03/14 (78 y.o. Male) Treating RN: Yevonne Pax Primary Care Provider: Katy Apo Other Clinician: Referring Provider: Treating Provider/Extender:, Almira Coaster, Deirdre Peer Weeks in Treatment: 0 Unable to Obtain Patient History due to Altered Mental Status Constitutional Symptoms (General Health) Complaints and Symptoms: Negative for: Fatigue; Fever; Chills; Marked Weight Change Eyes Complaints and Symptoms: Negative for: Dry Eyes; Vision Changes; Glasses / Contacts Medical History: Negative for: Cataracts; Glaucoma; Optic Neuritis Ear/Nose/Mouth/Throat Complaints and Symptoms: Negative for: Chronic sinus problems or rhinitis Medical History: Negative for: Chronic sinus problems/congestion; Middle ear problems Respiratory Complaints and Symptoms: Negative for: Chronic or frequent coughs; Shortness of Breath Medical History: Negative for: Aspiration; Asthma; Chronic Obstructive Pulmonary Disease (COPD); Pneumothorax; Sleep Apnea; Tuberculosis Cardiovascular Complaints and Symptoms: Negative for: Chest pain Medical History: Positive for: Hypertension Negative for: Angina; Arrhythmia; Congestive Heart Failure; Coronary Artery Disease; Deep Vein Thrombosis; Hypotension; Myocardial Infarction; Peripheral Arterial Disease; Peripheral Venous Disease; Phlebitis; Vasculitis Gastrointestinal Complaints and Symptoms: Negative for: Frequent diarrhea; Nausea; Vomiting Medical History: Negative for: Cirrhosis ; Colitis; Crohns; Hepatitis A; Hepatitis B; Hepatitis C Endocrine Complaints and Symptoms: Negative for: Heat/cold  intolerance Medical History: Negative for: Type I Diabetes; Type II Diabetes Genitourinary Complaints and Symptoms: Negative for: Frequent urination Medical History: Negative for: End Stage Renal Disease Integumentary (Skin) Complaints and Symptoms: Positive for: Wounds Medical History: Negative for: History of Burn Musculoskeletal Complaints and Symptoms: Negative for: Muscle Pain; Muscle Weakness Medical History: Positive for: Osteomyelitis Negative for: Gout; Rheumatoid Arthritis; Osteoarthritis Neurologic Complaints and Symptoms: Negative for: Numbness/parasthesias Medical History: Positive for: Dementia Negative for: Neuropathy; Quadriplegia; Paraplegia; Seizure Disorder Psychiatric Complaints and Symptoms: Negative for: Claustrophobia; Suicidal Medical History: Negative for: Anorexia/bulimia; Confinement Anxiety Hematologic/Lymphatic Medical History: Negative for: Anemia; Hemophilia; Human Immunodeficiency Virus; Lymphedema; Sickle Cell Disease Immunological Medical History: Negative for: Lupus Erythematosus; Raynauds; Scleroderma Oncologic Medical History: Negative for: Received Chemotherapy; Received Radiation Immunizations Pneumococcal Vaccine: Received Pneumococcal Vaccination: No Implantable Devices None Family and Social History Cancer: No; Diabetes: No; Heart Disease: No; Hereditary Spherocytosis: No; Hypertension: No; Kidney Disease: No; Lung Disease: No; Seizures: No; Stroke: No; Thyroid Problems: No; Tuberculosis: No; Never smoker; Marital Status - Single; Alcohol Use: Never; Drug Use: No History; Caffeine Use: Never; Financial Concerns: No; Food, Clothing or Shelter Needs: No; Support System Lacking: No; Transportation Concerns: No Electronic Signature(s) Signed: 12/20/2018 5:33:39 PM By: Baltazar Najjar MD Signed: 02/11/2019 3:01:15 PM By: Yevonne Pax RN Entered By: Yevonne Pax on 12/20/2018  09:58:00 -------------------------------------------------------------------------------- SuperBill Details Patient Name: Date of Service: Joshua Gonzales, Joshua Gonzales 12/20/2018 Medical Record [id number] Patient Account Number: [id number] Date of Birth/Sex: Treating RN: 18-Jun-1940 (78 y.o. Male) Zandra Abts Primary Care Provider: Katy Apo Other  Clinician: Referring Provider: Treating Provider/Extender:,  [name], Deirdre Peeronald D Weeks in TreatmentAlmira Coaster: 0 Diagnosis Coding ICD-10 Codes Code Description L89.154 Pressure ulcer of sacral region, stage 4 M86.68 Other chronic osteomyelitis, other site L89.612 Pressure ulcer of right heel, stage 2 L89.626 Pressure-induced deep tissue damage of left heel Facility Procedures Physician Procedures CPT4 Code: [id number] Description: 99204 - WC PHYS LEVEL 4 - NEW PT ICD-10 Diagnosis Description L89.154 Pressure ulcer of sacral region, stage 4 M86.68 Other chronic osteomyelitis, other site L89.612 Pressure ulcer of right heel, stage 2 L89.626 Pressure-induced deep tissue  damage of left heel Modifier: Quantity: 1 Electronic Signature(s) Signed: 12/20/2018 5:33:39 PM By: Baltazar Najjarobson,  MD Signed: 12/20/2018 6:00:55 PM By: Zandra AbtsLynch, Shatara RN, BSN Entered By: Zandra AbtsLynch, Shatara on 12/20/2018 17:24:31

## 2019-02-11 NOTE — Progress Notes (Signed)
Joshua Gonzales, Joshua H. ([id number]) Visit Report for 12/20/2018 Allergy List Details Patient Name: Date of Service: Joshua Gonzales, Joshua H. 12/20/2018 9:00 AM Medical Record [id number] Patient Account Number: [id number] Date of Birth/Sex: Treating RN: 12-21-1940 (78 y.o. Male) Yevonne PaxEpps, Carrie Primary Care Donyae Kilner: Katy ApoPolite, [name] D Other Clinician: Referring Zaiden Ludlum: Treating Ivie Maese/Extender:Robson, Almira CoasterMichael [name], Deirdre Peeronald D Weeks in Treatment: 0 Allergies Active Allergies No Known Allergies Allergy Notes Electronic Signature(s) Signed: 02/11/2019 3:01:15 PM By: Yevonne PaxEpps, Carrie RN Entered By: Yevonne PaxEpps, Carrie on 12/20/2018 09:54:35 -------------------------------------------------------------------------------- Arrival Information Details Patient Name: Date of Service: Joshua Gonzales, Joshua H. 12/20/2018 9:00 AM Medical Record [id number] Patient Account Number: [id number] Date of Birth/Sex: Treating RN: 12-21-1940 (78 y.o. Male) Yevonne PaxEpps, Carrie Primary Care Dyani Babel: Katy ApoPolite, [name] D Other Clinician: Referring Seraphina Mitchner: Treating Patirica Longshore/Extender:Robson, Almira CoasterMichael [name], Chalmers Guestonald D Weeks in Treatment: 0 Visit Information Patient Arrived: Stretcher Arrival Time: 09:49 Accompanied By: caregiver Transfer Assistance: Manual Patient Identification Verified: Yes Secondary Verification Process Yes Completed: Patient Requires Transmission-Based No Precautions: Patient Has Alerts: Yes Patient Alerts: right non compressable Electronic Signature(s) Signed: 02/11/2019 3:01:15 PM By: Yevonne PaxEpps, Carrie RN Entered By: Yevonne PaxEpps, Carrie on 12/20/2018 10:21:04 -------------------------------------------------------------------------------- Clinic Level of Care Assessment Details Patient Name: Date of Service: Joshua Gonzales, Jere H. 12/20/2018 9:00 AM Medical Record [id number] Patient Account Number: [id number] Date of Birth/Sex: Treating RN: 12-21-1940 (78 y.o. Male) Zandra AbtsLynch, Shatara Primary Care Madhav Mohon:  Katy ApoPolite, [name] D Other Clinician: Referring Shalon Salado: Treating Seymone Forlenza/Extender:Robson, Almira CoasterMichael [name], Chalmers Guestonald D Weeks in Treatment: 0 Clinic Level of Care Assessment Items TOOL 2 Quantity Score X - Use when only an EandM is performed on the INITIAL visit 1 0 ASSESSMENTS - Nursing Assessment / Reassessment X - General Physical Exam (combine w/ comprehensive assessment (listed just below) 1 20 when performed on new pt. evals) X - Comprehensive Assessment (HX, ROS, Risk Assessments, Wounds Hx, etc.) 1 25 ASSESSMENTS - Wound and Skin Assessment / Reassessment []  - Simple Wound Assessment / Reassessment - one wound 0 X - Complex Wound Assessment / Reassessment - multiple wounds 2 5 []  - Dermatologic / Skin Assessment (not related to wound area) 0 ASSESSMENTS - Ostomy and/or Continence Assessment and Care []  - Incontinence Assessment and Management 0 []  - Ostomy Care Assessment and Management (repouching, etc.) 0 PROCESS - Coordination of Care X - Simple Patient / Family Education for ongoing care 1 15 []  - Complex (extensive) Patient / Family Education for ongoing care 0 X - Staff obtains ChiropractorConsents, Records, Test Results / Process Orders 1 10 X - Staff telephones HHA, Nursing Homes / Clarify orders / etc 1 10 []  - Routine Transfer to another Facility (non-emergent condition) 0 []  - Routine Hospital Admission (non-emergent condition) 0 X - New Admissions / Manufacturing engineernsurance Authorizations / Ordering NPWT, Apligraf, etc. 1 15 []  - Emergency Hospital Admission (emergent condition) 0 X - Simple Discharge Coordination 1 10 []  - Complex (extensive) Discharge Coordination 0 PROCESS - Special Needs []  - Pediatric / Minor Patient Management 0 []  - Isolation Patient Management 0 []  - Hearing / Language / Visual special needs 0 []  - Assessment of Community assistance (transportation, D/C planning, etc.) 0 []  - Additional assistance / Altered mentation 0 []  - Support Surface(s) Assessment (bed, cushion,  seat, etc.) 0 INTERVENTIONS - Wound Cleansing / Measurement X - Wound Imaging (photographs - any number of wounds) 1 5 []  - Wound Tracing (instead of photographs) 0 []  - Simple Wound Measurement - one wound 0 X - Complex Wound Measurement - multiple wounds 2 5 []  - Simple Wound Cleansing -  one wound 0 X - Complex Wound Cleansing - multiple wounds 2 5 INTERVENTIONS - Wound Dressings X - Small Wound Dressing one or multiple wounds 2 10 []  - Medium Wound Dressing one or multiple wounds 0 []  - Large Wound Dressing one or multiple wounds 0 []  - Application of Medications - injection 0 INTERVENTIONS - Miscellaneous []  - External ear exam 0 []  - Specimen Collection (cultures, biopsies, blood, body fluids, etc.) 0 []  - Specimen(s) / Culture(s) sent or taken to Lab for analysis 0 []  - Patient Transfer (multiple staff / Harrel Lemon Lift / Similar devices) 0 []  - Simple Staple / Suture removal (25 or less) 0 []  - Complex Staple / Suture removal (26 or more) 0 []  - Hypo / Hyperglycemic Management (close monitor of Blood Glucose) 0 X - Ankle / Brachial Index (ABI) - do not check if billed separately 1 15 Has the patient been seen at the hospital within the last three years: Yes Total Score: 175 Level Of Care: New/Established - Level 5 Electronic Signature(s) Signed: 12/20/2018 6:00:55 PM By: Levan Hurst RN, BSN Entered By: Levan Hurst on 12/20/2018 17:24:22 -------------------------------------------------------------------------------- Encounter Discharge Information Details Patient Name: Date of Service: Joshua Coffin. 12/20/2018 9:00 AM Medical Record [id number] Patient Account Number: [id number] Date of Birth/Sex: Treating RN: 10-17-1940 (78 y.o. Male) Kela Millin Primary Care Lachrisha Ziebarth: Kandice Hams Other Clinician: Referring Shaivi Rothschild: Treating Amandy Chubbuck/Extender:Robson, Alice Reichert, Lowella Dandy in Treatment: 0 Encounter Discharge Information Items Discharge  Condition: Stable Ambulatory Status: Stretcher Discharge Destination: [place] Telephoned: No Orders Sent: Yes Transportation: Ambulance Accompanied By: caregiver Schedule Follow-up Appointment: Yes Clinical Summary of Care: Patient Declined Electronic Signature(s) Signed: 12/24/2018 6:16:57 PM By: Kela Millin Entered By: Kela Millin on 12/20/2018 11:30:13 -------------------------------------------------------------------------------- Lower Extremity Assessment Details Patient Name: Date of Service: Joshua Gonzales, Joshua Gonzales 12/20/2018 9:00 AM Medical Record [id number] Patient Account Number: [id number] Date of Birth/Sex: Treating RN: Jun 28, 1940 (78 y.o. Male) Carlene Coria Primary Care Krisna Omar: Kandice Hams Other Clinician: Referring Jadence Kinlaw: Treating Gustaf Mccarter/Extender:Robson, Alice Reichert, Ashby Dawes Weeks in Treatment: 0 Edema Assessment Assessed: [Left: No] [Right: No] E[Left: dema] [Right: :] Calf Left: Right: Point of Measurement: 40 cm From Medial Instep 28.5 cm 28 cm Ankle Left: Right: Point of Measurement: 10 cm From Medial Instep 20 cm 18 cm Notes non compressable Electronic Signature(s) Signed: 02/11/2019 3:01:15 PM By: Carlene Coria RN Entered By: Carlene Coria on 12/20/2018 10:15:41 -------------------------------------------------------------------------------- Multi Wound Chart Details Patient Name: Date of Service: Joshua Coffin. 12/20/2018 9:00 AM Medical Record [id number] Patient Account Number: [id number] Date of Birth/Sex: Treating RN: 1941-02-28 (78 y.o. Male) Levan Hurst Primary Care Shine Scrogham: Kandice Hams Other Clinician: Referring Melville Engen: Treating Dhyan Noah/Extender:Robson, Alice Reichert, Ashby Dawes Weeks in Treatment: 0 Vital Signs Height(in): 74 Pulse(bpm): 90 Weight(lbs): 165 Blood Pressure(mmHg): 124/80 Body Mass Index(BMI): 21 Temperature(F): 97.7 Respiratory  16 Rate(breaths/min): Photos: [1:No Photos] [2:No Photos] [N/A:N/A] Wound Location: [1:Right Calcaneus] [2:Sacrum] [N/A:N/A] Wounding Event: [1:Blister] [2:Pressure Injury] [N/A:N/A] Primary Etiology: [1:Pressure Ulcer] [2:Pressure Ulcer] [N/A:N/A] Comorbid History: [1:Hypertension, Osteomyelitis, Dementia] [2:Hypertension, Osteomyelitis, Dementia] [N/A:N/A] Date Acquired: [1:11/05/2018] [2:11/05/2018] [N/A:N/A] Weeks of Treatment: [1:0] [2:0] [N/A:N/A] Wound Status: [1:Open] [2:Open] [N/A:N/A] Measurements L x W x D 1.5x2.5x0.1 [2:6x1.7x2.5] [N/A:N/A] (cm) Area (cm) : [1:2.945] [2:8.011] [N/A:N/A] Volume (cm) : [1:0.295] [2:20.028] [N/A:N/A] Starting Position 1 [2:12] (o'clock): Ending Position 1 [2:12] (o'clock): Maximum Distance 1 [2:3.6] (cm): Undermining: [1:No] [2:Yes] [N/A:N/A] 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] [N/A:N/A] Exudate Amount: [1:Medium] [2:Medium] [N/A:N/A] Exudate Type: [1:Serosanguineous] [2:Serosanguineous] [N/A:N/A] Exudate Color: [1:red,  brown] [2:red, brown] [N/A:N/A] Wound Margin: [1:Flat and Intact] [2:N/A] [N/A:N/A] Granulation Amount: [1:Large (67-100%)] [2:Large (67-100%)] [N/A:N/A] Granulation Quality: [1:Pink] [2:Red, Pink] [N/A:N/A] Necrotic Amount: [1:Small (1-33%)] [2:Small (1-33%)] [N/A:N/A] Exposed Structures: [1:Fat Layer (Subcutaneous Tissue) Exposed: Yes Fascia: No Tendon: No Muscle: No Joint: No Bone: No None] [2:Fat Layer (Subcutaneous Tissue) Exposed: Yes Muscle: Yes Bone: Yes Fascia: No Tendon: No Joint: No None] [N/A:N/A N/A] Treatment Notes Electronic Signature(s) Signed: 12/20/2018 5:33:39 PM By: Baltazar Najjar MD Signed: 12/20/2018 6:00:55 PM By: Zandra Abts RN, BSN Entered By: Baltazar Najjar on 12/20/2018 11:11:54 -------------------------------------------------------------------------------- Multi-Disciplinary Care Plan Details Patient Name: Date of Service: Joshua Gonzales. 12/20/2018 9:00  AM Medical Record [id number] Patient Account Number: [id number] Date of Birth/Sex: Treating RN: [birth year] (78 y.o. Male) Zandra Abts Primary Care Danahi Reddish: Katy Apo Other Clinician: Referring Hardie Veltre: Treating Jayleen Afonso/Extender:Robson, Almira Coaster, Deirdre Peer Weeks in Treatment: 0 Active Inactive Abuse / Safety / Falls / Self Care Management Nursing Diagnoses: Potential for falls Potential for injury related to falls Goals: Patient will remain injury free related to falls Date Initiated: 12/20/2018 Target Resolution Date: 01/17/2019 Goal Status: Active Patient/caregiver will verbalize/demonstrate measures taken to prevent injury and/or falls Date Initiated: 12/20/2018 Target Resolution Date: 01/17/2019 Goal Status: Active Interventions: Assess Activities of Daily Living upon admission and as needed Assess fall risk on admission and as needed Assess: immobility, friction, shearing, incontinence upon admission and as needed Assess impairment of mobility on admission and as needed per policy Assess personal safety and home safety (as indicated) on admission and as needed Assess self care needs on admission and as needed Provide education on fall prevention Provide education on personal and home safety Notes: Pressure Nursing Diagnoses: Knowledge deficit related to causes and risk factors for pressure ulcer development Knowledge deficit related to management of pressures ulcers Potential for impaired tissue integrity related to pressure, friction, moisture, and shear Goals: Patient/caregiver will verbalize risk factors for pressure ulcer development Date Initiated: 12/20/2018 Target Resolution Date: 01/17/2019 Goal Status: Active Patient/caregiver will verbalize understanding of pressure ulcer management Date Initiated: 12/20/2018 Target Resolution Date: 01/17/2019 Goal Status: Active Interventions: Assess: immobility, friction, shearing, incontinence  upon admission and as needed Assess offloading mechanisms upon admission and as needed Assess potential for pressure ulcer upon admission and as needed Provide education on pressure ulcers Notes: Wound/Skin Impairment Nursing Diagnoses: Impaired tissue integrity Knowledge deficit related to ulceration/compromised skin integrity Goals: Patient/caregiver will verbalize understanding of skin care regimen Date Initiated: 12/20/2018 Target Resolution Date: 01/17/2019 Goal Status: Active Interventions: Assess patient/caregiver ability to obtain necessary supplies Assess patient/caregiver ability to perform ulcer/skin care regimen upon admission and as needed Assess ulceration(s) every visit Provide education on ulcer and skin care Notes: Electronic Signature(s) Signed: 12/20/2018 6:00:55 PM By: Zandra Abts RN, BSN Entered By: Zandra Abts on 12/20/2018 17:22:23 -------------------------------------------------------------------------------- Pain Assessment Details Patient Name: Date of Service: Joshua Gonzales 12/20/2018 9:00 AM Medical Record [id number] Patient Account Number: [id number] Date of Birth/Sex: Treating RN: 1941/02/05 (78 y.o. Male) Yevonne Pax Primary Care Alivya Wegman: Katy Apo Other Clinician: Referring Yeison Sippel: Treating Tayquan Gassman/Extender:Robson, Almira Coaster, Deirdre Peer Weeks in Treatment: 0 Active Problems Location of Pain Severity and Description of Pain Patient Has Paino No Site Locations Pain Management and Medication Current Pain Management: Electronic Signature(s) Signed: 02/11/2019 3:01:15 PM By: Yevonne Pax RN Entered By: Yevonne Pax on 12/20/2018 10:20:17 -------------------------------------------------------------------------------- Patient/Caregiver Education Details Patient Name: Joshua Gonzales 10/16/2020andnbsp9:00 Date of Service: AM Medical Record [id number] Number: Patient Account Number: [id number] Treating  RN: Date  of Birth/Gender: [birth year] (78 y.o. Zandra Abts Male) Other Clinician: Primary Care Treating [name], Josie Saunders Physician: Myna Bright Physician: Physician/Extender: Referring Physician: Renee Ramus in Treatment: 0 Education Assessment Education Provided To: Patient Education Topics Provided Pressure: Methods: Explain/Verbal Responses: State content correctly Wound/Skin Impairment: Methods: Explain/Verbal Responses: State content correctly Electronic Signature(s) Signed: 12/20/2018 6:00:55 PM By: Zandra Abts RN, BSN Entered By: Zandra Abts on 12/20/2018 17:22:32 -------------------------------------------------------------------------------- Wound Assessment Details Patient Name: Date of Service: Joshua Gonzales 12/20/2018 9:00 AM Medical Record [id number] Patient Account Number: [id number] Date of Birth/Sex: Treating RN: Jul 21, 1940 (78 y.o. Male) Zandra Abts Primary Care Asyria Kolander: Katy Apo Other Clinician: Referring Kamia Insalaco: Treating Elianie Hubers/Extender:Robson, Almira Coaster, Deirdre Peer Weeks in Treatment: 0 Wound Status Wound Number: 1 Primary Etiology: Pressure Ulcer Wound Location: Right Calcaneus Wound Status: Open Wounding Event: Blister Comorbid Hypertension, Osteomyelitis, History: Dementia Date Acquired: 11/05/2018 Weeks Of Treatment: 0 Clustered Wound: No Photos Wound Measurements Length: (cm) 1.5 Width: (cm) 2.5 Depth: (cm) 0.1 Area: (cm) 2.945 Volume: (cm) 0.295 Wound Description Classification: Category/Stage II Wound Margin: Flat and Intact Exudate Amount: Medium Exudate Type: Serosanguineous Exudate Color: red, brown Wound Bed Granulation Amount: Large (67-100%) Granulation Quality: Pink Necrotic Amount: Small (1-33%) Necrotic Quality: Adherent Slough After Cleansing: No rino Yes Exposed Structure osed: No (Subcutaneous Tissue) Exposed: Yes osed:  No osed: No sed: No ed: No % Reduction in Area: 0% % Reduction in Volume: 0% Epithelialization: None Tunneling: No Undermining: No Foul Odor Slough/Fib Fascia Exp Fat Layer Tendon Exp Muscle Exp Joint Expo Bone Expos Treatment Notes Wound #1 (Right Calcaneus) 1. Cleanse With Wound Cleanser 2. Periwound Care Skin Prep 3. Primary Dressing Applied Collegen AG Hydrogel or K-Y Jelly 4. Secondary Dressing Dry Gauze Roll Gauze Heel Cup 5. Secured With Secretary/administrator) Signed: 12/24/2018 1:32:19 PM By: Benjaman Kindler EMT/HBOT Signed: 12/25/2018 6:50:57 PM By: Zandra Abts RN, BSN Previous Signature: 12/20/2018 6:00:55 PM Version By: Zandra Abts RN, BSN Entered By: Benjaman Kindler on 12/24/2018 11:08:05 -------------------------------------------------------------------------------- Wound Assessment Details Patient Name: Date of Service: Joshua Gonzales, Joshua Gonzales 12/20/2018 9:00 AM Medical Record [id number] Patient Account Number: [id number] Date of Birth/Sex: Treating RN: 1940-08-06 (78 y.o. Male) Yevonne Pax Primary Care Regena Delucchi: Katy Apo Other Clinician: Referring Maxamilian Amadon: Treating Bader Stubblefield/Extender:Robson, Almira Coaster, Deirdre Peer Weeks in Treatment: 0 Wound Status Wound Number: 2 Primary Etiology: Pressure Ulcer Wound Location: Sacrum Wound Status: Open Wounding Event: Pressure Injury Comorbid Hypertension, Osteomyelitis, History: Dementia Date Acquired: 11/05/2018 Weeks Of Treatment: 0 Clustered Wound: No Photos Wound Measurements Length: (cm) 6 % Reduction in Are Width: (cm) 1.7 % Reduction in Vol Depth: (cm) 2.5 Epithelialization: Area: (cm) 8.011 Tunneling: Volume: (cm) 20.028 Undermining: Starting Positi Ending Position Maximum Distanc a: 0% ume: 0% None No Yes on (o'clock): 12 (o'clock): 12 e: (cm) 3.6 Wound Description Classification: Category/Stage IV Foul Odor After Cl Exudate Amount: Medium  Slough/Fibrino Exudate Type: Serosanguineous Exudate Color: red, brown Wound Bed Granulation Amount: Large (67-100%) Granulation Quality: Red, Pink Fascia Exposed: Necrotic Amount: Small (1-33%) Fat Layer Ponciano Ort Necrotic Quality: Adherent Slough Tendon Exposed: Muscle Exposed: Necrosis of M Joint Exposed: Bone Exposed: eansing: No Yes Exposed Structure No neous Tissue) Exposed: Yes No Yes uscle: No No Yes Treatment Notes Wound #2 (Sacrum) 1. Cleanse With Wound Cleanser 2. Periwound Care Skin Prep 3. Primary Dressing Applied Collegen AG Hydrogel or K-Y Jelly 4. Secondary Dressing ABD Pad Dry Gauze 5. Secured With Secretary/administrator) Signed: 12/24/2018 1:32:19 PM By: Yetta Barre,  Dedrick EMT/HBOT Signed: 02/11/2019 3:01:15 PM By: Yevonne Pax RN Entered By: Benjaman Kindler on 12/24/2018 11:01:16 -------------------------------------------------------------------------------- Vitals Details Patient Name: Date of Service: Joshua Gonzales, Joshua Gonzales 12/20/2018 9:00 AM Medical Record [id number] Patient Account Number: [id number] Date of Birth/Sex: Treating RN: 05-07-1940 (78 y.o. Male) Yevonne Pax Primary Care Deaire Mcwhirter: Katy Apo Other Clinician: Referring Theordore Cisnero: Treating Allora Bains/Extender:Robson, Almira Coaster, Deirdre Peer Weeks in Treatment: 0 Vital Signs Time Taken: 09:51 Temperature (F): 97.7 Height (in): 74 Pulse (bpm): 90 Source: Stated Respiratory Rate (breaths/min): 16 Weight (lbs): 165 Blood Pressure (mmHg): 124/80 Body Mass Index (BMI): 21.2 Reference Range: 80 - 120 mg / dl Electronic Signature(s) Signed: 02/11/2019 3:01:15 PM By: Yevonne Pax RN Entered By: Yevonne Pax on 12/20/2018 09:54:00

## 2019-02-11 NOTE — Progress Notes (Signed)
DAVIS, AMBROSINI ([id number]) Visit Report for 12/20/2018 Abuse/Suicide Risk Screen Details Patient Name: Date of Service: Joshua Gonzales, Joshua Gonzales 12/20/2018 9:00 AM Medical Record [id number] Patient Account Number: [id number] Date of Birth/Sex: [birth year] (78 y.o. Male) Treating RN: Carlene Coria Primary Care Tallyn Holroyd: Kandice Hams Other Clinician: Referring Nancie Bocanegra: Treating Novalyn Lajara/Extender:Robson, Alice Reichert, Ashby Dawes Weeks in Treatment: 0 Abuse/Suicide Risk Screen Items Answer ABUSE RISK SCREEN: Has anyone close to you tried to hurt or harm you recentlyo No Do you feel uncomfortable with anyone in your familyo No Has anyone forced you do things that you didnt want to doo No Electronic Signature(s) Signed: 02/11/2019 3:01:15 PM By: Carlene Coria RN Entered By: Carlene Coria on 12/20/2018 09:58:06 -------------------------------------------------------------------------------- Activities of Daily Living Details Patient Name: Date of Service: Joshua Gonzales, Joshua Gonzales 12/20/2018 9:00 AM Medical Record [id number] Patient Account Number: [id number] Date of Birth/Sex: 1941/02/14 (78 y.o. Male) Treating RN: Carlene Coria Primary Care Heli Dino: Kandice Hams Other Clinician: Referring Doris Mcgilvery: Treating Taytum Scheck/Extender:Robson, Alice Reichert, Ashby Dawes Weeks in Treatment: 0 Activities of Daily Living Items Answer Activities of Daily Living (Please select one for each item) Drive Automobile Not Able Take Medications Not Able Use Telephone Not Able Care for Appearance Not Able Use Toilet Not Able Manus Rudd / Shower Not Able Dress Self Not Able Feed Self Not Able Walk Not Able Get In / Out Bed Not Able Housework Not Able Prepare Meals Not Able Handle Money Not Able Shop for Self Not Able Electronic Signature(s) Signed: 02/11/2019 3:01:15 PM By: Carlene Coria RN Entered By: Carlene Coria on 12/20/2018  09:58:38 -------------------------------------------------------------------------------- Education Screening Details Patient Name: Date of Service: Joshua Gonzales 12/20/2018 9:00 AM Medical Record [id number] Patient Account Number: [id number] Date of Birth/Sex: 1940/07/07 (78 y.o. Male) Treating RN: Carlene Coria Primary Care Xariah Silvernail: Kandice Hams Other Clinician: Referring Kennadie Brenner: Treating Jacub Waiters/Extender:Robson, Alice Reichert, Lowella Dandy in Treatment: 0 Primary Learner Assessed: Caregiver Learning Preferences/Education Level/Primary Language Learning Preference: Explanation Highest Education Level: High School Preferred Language: English Cognitive Barrier Language Barrier: No Translator Needed: No Memory Deficit: No Emotional Barrier: No Cultural/Religious Beliefs Affecting Medical Care: No Physical Barrier Impaired Vision: No Impaired Hearing: No Decreased Hand dexterity: No Knowledge/Comprehension Knowledge Level: High Comprehension Level: High Ability to understand written High instructions: Ability to understand verbal High instructions: Motivation Anxiety Level: Calm Cooperation: Cooperative Education Importance: Acknowledges Need Interest in Health Problems: Asks Questions Perception: Coherent Willingness to Engage in Self- High Management Activities: Readiness to Engage in Self- High Management Activities: Electronic Signature(s) Signed: 02/11/2019 3:01:15 PM By: Carlene Coria RN Entered By: Carlene Coria on 12/20/2018 09:59:06 -------------------------------------------------------------------------------- Fall Risk Assessment Details Patient Name: Date of Service: Joshua Gonzales. 12/20/2018 9:00 AM Medical Record [id number] Patient Account Number: [id number] Date of Birth/Sex: Feb 18, 1941 (78 y.o. Male) Treating RN: Carlene Coria Primary Care Alayssa Flinchum: Kandice Hams Other Clinician: Referring Yianni Skilling: Treating  Shelby Peltz/Extender:Robson, Alice Reichert, Ashby Dawes Weeks in Treatment: 0 Fall Risk Assessment Items Have you had 2 or more falls in the last 12 monthso 0 No Have you had any fall that resulted in injury in the last 12 monthso 0 No FALLS RISK SCREEN History of falling - immediate or within 3 months 0 No Secondary diagnosis (Do you have 2 or more medical diagnoseso) 0 No Ambulatory aid None/bed rest/wheelchair/nurse 0 No Crutches/cane/walker 0 No Furniture 0 No Intravenous therapy Access/Saline/Heparin Lock 0 No Weak (short steps with or without shuffle, stooped but able to lift head 0 No while walking, may seek support from furniture)  Impaired (short steps with shuffle, may have difficulty arising from chair, 0 No head down, impaired balance) Mental Status Oriented to own ability 0 No Overestimates or forgets limitations 0 No Risk Level: Low Risk Score: 0 Electronic Signature(s) Signed: 02/11/2019 3:01:15 PM By: Yevonne Pax RN Entered By: Yevonne Pax on 12/20/2018 09:59:14 -------------------------------------------------------------------------------- Foot Assessment Details Patient Name: Date of Service: Joshua Partridge. 12/20/2018 9:00 AM Medical Record [id number] Patient Account Number: [id number] Date of Birth/Sex: 1940/10/28 (78 y.o. Male) Treating RN: Yevonne Pax Primary Care Stephnie Parlier: Katy Apo Other Clinician: Referring Lemya Greenwell: Treating Bree Heinzelman/Extender:Robson, Almira Coaster, Deirdre Peer Weeks in Treatment: 0 Foot Assessment Items [x]  Unable to perform due to altered mental status Site Locations + = Sensation present, - = Sensation absent, C = Callus, U = Ulcer R = Redness, W = Warmth, M = Maceration, PU = Pre-ulcerative lesion F = Fissure, S = Swelling, D = Dryness Assessment Right: Left: Other Deformity: No No Prior Foot Ulcer: No No Prior Amputation: No No Charcot Joint: No No Ambulatory Status: Gait: Electronic Signature(s) Signed:  02/11/2019 3:01:15 PM By: 14/10/2018 RN Entered By: Yevonne Pax on 12/20/2018 10:07:22 -------------------------------------------------------------------------------- Nutrition Risk Screening Details Patient Name: Date of Service: Joshua Gonzales, Joshua Gonzales 12/20/2018 9:00 AM Medical Record 12/22/2018 Patient Account Number: [id number] Date of Birth/Sex: 30-Aug-1940 (78 y.o. Male) Treating RN: 02-07-1972 Primary Care Anedra Penafiel: Yevonne Pax Other Clinician: Referring Koni Kannan: Treating Khloei Spiker/Extender:Robson, Katy Apo, Almira Coaster Weeks in Treatment: 0 Height (in): 74 Weight (lbs): 165 Body Mass Index (BMI): 21.2 Nutrition Risk Screening Items Score Screening NUTRITION RISK SCREEN: I have an illness or condition that made me change the kind and/or 0 No amount of food I eat I eat fewer than two meals per day 0 No I eat few fruits and vegetables, or milk products 0 No I have three or more drinks of beer, liquor or wine almost every day 0 No I have tooth or mouth problems that make it hard for me to eat 0 No I don't always have enough money to buy the food I need 0 No I eat alone most of the time 0 No I take three or more different prescribed or over-the-counter drugs a day 1 Yes 2 Yes Without wanting to, I have lost or gained 10 pounds in the last six months I am not always physically able to shop, cook and/or feed myself 2 Yes Nutrition Protocols Good Risk Protocol Provide education on Moderate Risk Protocol 0 nutrition High Risk Proctocol Risk Level: Moderate Risk Score: 5 Electronic Signature(s) Signed: 02/11/2019 3:01:15 PM By: 14/10/2018 RN Entered By: Yevonne Pax on 12/20/2018 09:59:42

## 2019-02-18 DIAGNOSIS — R509 Fever, unspecified: Secondary | ICD-10-CM

## 2019-08-11 ENCOUNTER — Inpatient Hospital Stay (HOSPITAL_COMMUNITY)
Admission: EM | Admit: 2019-08-11 | Discharge: 2019-08-14 | DRG: 199 | Disposition: A | Discharge status: Skilled Nursing Facility | Payer: Medicare Other | Attending: Internal Medicine | Admitting: Internal Medicine

## 2019-08-11 ENCOUNTER — Emergency Department (HOSPITAL_COMMUNITY): Payer: Medicare Other

## 2019-08-11 DIAGNOSIS — R799 Abnormal finding of blood chemistry, unspecified: Secondary | ICD-10-CM

## 2019-08-11 DIAGNOSIS — J9311 Primary spontaneous pneumothorax: Secondary | ICD-10-CM | POA: Diagnosis not present

## 2019-08-11 DIAGNOSIS — J9601 Acute respiratory failure with hypoxia: Secondary | ICD-10-CM | POA: Diagnosis present

## 2019-08-11 DIAGNOSIS — J939 Pneumothorax, unspecified: Secondary | ICD-10-CM

## 2019-08-11 DIAGNOSIS — Z83438 Family history of other disorder of lipoprotein metabolism and other lipidemia: Secondary | ICD-10-CM

## 2019-08-11 DIAGNOSIS — R627 Adult failure to thrive: Secondary | ICD-10-CM | POA: Diagnosis present

## 2019-08-11 DIAGNOSIS — Z20822 Contact with and (suspected) exposure to covid-19: Secondary | ICD-10-CM | POA: Diagnosis present

## 2019-08-11 DIAGNOSIS — Z515 Encounter for palliative care: Secondary | ICD-10-CM | POA: Diagnosis present

## 2019-08-11 DIAGNOSIS — L89154 Pressure ulcer of sacral region, stage 4: Secondary | ICD-10-CM | POA: Diagnosis present

## 2019-08-11 DIAGNOSIS — Z87891 Personal history of nicotine dependence: Secondary | ICD-10-CM

## 2019-08-11 DIAGNOSIS — R0602 Shortness of breath: Secondary | ICD-10-CM | POA: Diagnosis not present

## 2019-08-11 DIAGNOSIS — G9341 Metabolic encephalopathy: Secondary | ICD-10-CM | POA: Diagnosis present

## 2019-08-11 DIAGNOSIS — Z79899 Other long term (current) drug therapy: Secondary | ICD-10-CM

## 2019-08-11 DIAGNOSIS — Z7982 Long term (current) use of aspirin: Secondary | ICD-10-CM

## 2019-08-11 DIAGNOSIS — I10 Essential (primary) hypertension: Secondary | ICD-10-CM | POA: Diagnosis present

## 2019-08-11 DIAGNOSIS — R069 Unspecified abnormalities of breathing: Secondary | ICD-10-CM

## 2019-08-11 DIAGNOSIS — Z8249 Family history of ischemic heart disease and other diseases of the circulatory system: Secondary | ICD-10-CM

## 2019-08-11 DIAGNOSIS — Z66 Do not resuscitate: Secondary | ICD-10-CM | POA: Diagnosis present

## 2019-08-11 DIAGNOSIS — I480 Paroxysmal atrial fibrillation: Secondary | ICD-10-CM | POA: Diagnosis present

## 2019-08-11 DIAGNOSIS — G40909 Epilepsy, unspecified, not intractable, without status epilepticus: Secondary | ICD-10-CM | POA: Diagnosis present

## 2019-08-11 DIAGNOSIS — F0281 Dementia in other diseases classified elsewhere with behavioral disturbance: Secondary | ICD-10-CM | POA: Diagnosis present

## 2019-08-11 DIAGNOSIS — Z79891 Long term (current) use of opiate analgesic: Secondary | ICD-10-CM

## 2019-08-11 DIAGNOSIS — F03918 Unspecified dementia, unspecified severity, with other behavioral disturbance: Secondary | ICD-10-CM | POA: Diagnosis present

## 2019-08-11 DIAGNOSIS — G309 Alzheimer's disease, unspecified: Secondary | ICD-10-CM | POA: Diagnosis present

## 2019-08-11 DIAGNOSIS — Z931 Gastrostomy status: Secondary | ICD-10-CM

## 2019-08-11 DIAGNOSIS — Z7401 Bed confinement status: Secondary | ICD-10-CM

## 2019-08-11 DIAGNOSIS — Z87898 Personal history of other specified conditions: Secondary | ICD-10-CM

## 2019-08-11 DIAGNOSIS — J449 Chronic obstructive pulmonary disease, unspecified: Secondary | ICD-10-CM | POA: Diagnosis present

## 2019-08-11 DIAGNOSIS — E119 Type 2 diabetes mellitus without complications: Secondary | ICD-10-CM | POA: Diagnosis present

## 2019-08-11 LAB — CBC WITH DIFFERENTIAL/PLATELET
Abs Immature Granulocytes: 0.02 10*3/uL (ref 0.00–0.07)
Basophils Absolute: 0 10*3/uL (ref 0.0–0.1)
Basophils Relative: 0 %
Eosinophils Absolute: 0.3 10*3/uL (ref 0.0–0.5)
Eosinophils Relative: 5 %
HCT: 46.4 % (ref 39.0–52.0)
Hemoglobin: 14.7 g/dL (ref 13.0–17.0)
Immature Granulocytes: 0 %
Lymphocytes Relative: 31 %
Lymphs Abs: 2.1 10*3/uL (ref 0.7–4.0)
MCH: 31.4 pg (ref 26.0–34.0)
MCHC: 31.7 g/dL (ref 30.0–36.0)
MCV: 99.1 fL (ref 80.0–100.0)
Monocytes Absolute: 0.7 10*3/uL (ref 0.1–1.0)
Monocytes Relative: 11 %
Neutro Abs: 3.5 10*3/uL (ref 1.7–7.7)
Neutrophils Relative %: 53 %
Platelets: 166 10*3/uL (ref 150–400)
RBC: 4.68 MIL/uL (ref 4.22–5.81)
RDW: 15.7 % — ABNORMAL HIGH (ref 11.5–15.5)
WBC: 6.7 10*3/uL (ref 4.0–10.5)
nRBC: 0 % (ref 0.0–0.2)

## 2019-08-11 LAB — BASIC METABOLIC PANEL
Anion gap: 15 (ref 5–15)
BUN: 34 mg/dL — ABNORMAL HIGH (ref 8–23)
CO2: 23 mmol/L (ref 22–32)
Calcium: 9.1 mg/dL (ref 8.9–10.3)
Chloride: 98 mmol/L (ref 98–111)
Creatinine, Ser: 0.87 mg/dL (ref 0.61–1.24)
GFR calc Af Amer: >60 mL/min (ref >60)
GFR calc non Af Amer: >60 mL/min (ref >60)
Glucose, Bld: 157 mg/dL — ABNORMAL HIGH (ref 70–99)
Potassium: 4.2 mmol/L (ref 3.5–5.1)
Sodium: 136 mmol/L (ref 135–145)

## 2019-08-11 NOTE — ED Provider Notes (Signed)
[facility] EMERGENCY DEPARTMENT Provider Note   CSN: [id number] Arrival date & time: 08/11/19  2251    History Chief Complaint  Patient presents with  . Shortness of Breath  . Chest Injury    Joshua Gonzales is a 79 y.o. male.  The history is provided by the nursing home. The history is limited by the condition of the patient (Unresponsive).  Shortness of Breath He has history of hypertension, diabetes, stroke, seizures, dementia and was transferred from skilled nursing facility because of pneumothorax.  Apparently, he had been short of breath for 24 hours and an x-ray is reported to show have shown a left pneumothorax.  Past Medical History:  Diagnosis Date  . Alzheimer disease ([place])   . Hypertension   . Seizures ([place])   . Wound of sacral region 01/2019    Patient Active Problem List   Diagnosis Date Noted  . Fever of unknown origin   . Sacral wound 01/07/2019  . HTN (hypertension) 01/07/2019  . Alzheimer's dementia ([place]) 01/07/2019  . 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 Pressure injury of skin 11/24/2018  . Acute metabolic encephalopathy 03/14/3233  . Cellulitis 11/23/2018  . Osteomyelitis ([place]) 11/23/2018  . Constipation 11/23/2018  . Goals of care, counseling/discussion   . Palliative care by specialist   . FTT (failure to thrive) in adult   . Type 2 diabetes mellitus without complication ([place]) 57/32/2025  . History of CVA with residual deficit 08/16/2018  . Hyperammonemia ([place]) 08/16/2018  . Bradycardia 08/16/2018  . Lactic acid increased   . Hypotension 06/03/2014  . Near syncope 06/03/2014  . AKI (acute kidney injury) ([place]) 06/03/2014  . 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 Dementia with behavioral disturbance ([place])   . PAF (paroxysmal atrial fibrillation) ([place])   . History of seizures 05/15/2014  . Essential hypertension, benign 01/24/2013  . Unspecified late effects of  cerebrovascular disease 01/24/2013  . Acute encephalopathy 11/26/2012  . Hypokalemia 11/26/2012  . 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 IR GASTROSTOMY TUBE MOD SED  09/30/2018       Family History  Problem Relation Age of Onset  . Hyperlipidemia Mother   . Hypertension Mother   . Hypertension Father   . Hyperlipidemia Father     Social History   Tobacco Use  . Smoking status: Former Smoker  Substance Use Topics  . Alcohol use: Yes  . Drug use: Not on file    Home Medications Prior to Admission medications   Medication Sig Start Date End Date Taking? Authorizing Provider  acetaminophen (TYLENOL) 325 MG tablet Take 2 tablets (650 mg total) by mouth every 6 (six) hours as needed for mild pain (or Fever >/= 101). 11/28/18   Rai, Ripudeep Raliegh Ip, MD  Amino Acids-Protein Hydrolys (FEEDING SUPPLEMENT, PRO-STAT SUGAR FREE 64,) LIQD Take 30 mLs by mouth 2 (two) times a day.    [provider]  amLODipine (NORVASC) 5 MG tablet Take 1 tablet (5 mg total) by mouth daily. 01/14/19 05/14/19  Charolotte Capuchin, MD  aspirin 81 MG chewable tablet Chew 81 mg by mouth daily.    [provider]  collagenase (SANTYL) ointment Apply topically daily. Apply to sacral wound daily Patient not taking: Reported on 01/08/2019 11/28/18   Rai, Vernelle Emerald, MD  donepezil (ARICEPT) 10 MG tablet Place 10 mg into feeding tube at bedtime.  [provider]  ipratropium-albuterol (DUONEB) 0.5-2.5 (3) MG/3ML SOLN Take 3 mLs by nebulization every 6 (six) hours as needed (for shortness of breath or wheezing).    [provider]  lactulose (CHRONULAC) 10 GM/15ML solution Take 20 g by mouth daily as needed for moderate constipation.     [provider]  levETIRAcetam (KEPPRA) 100 MG/ML solution Place 750 mg into feeding tube 2 (two) times daily.     [provider]  metoprolol tartrate (LOPRESSOR) 25 MG tablet  Take 0.5 tablets (12.5 mg total) by mouth 2 (two) times daily. 11/28/18   Rai, Delene Ruffini, MD  Multiple Vitamins-Minerals (CERTA-VITE PO) Give 1 tablet by tube daily.     [provider]  nystatin cream (MYCOSTATIN)  12/23/18   [provider]  polyethylene glycol (MIRALAX / GLYCOLAX) 17 g packet Take 17 g by mouth 2 (two) times daily. Patient taking differently: Place 17 g into feeding tube 2 (two) times daily.  11/28/18   Rai, Delene Ruffini, MD  senna (SENOKOT) 8.6 MG TABS tablet Take 1 tablet (8.6 mg total) by mouth 2 (two) times daily. Patient taking differently: Place 1 tablet into feeding tube 2 (two) times daily.  11/28/18   Rai, Delene Ruffini, MD  traMADol (ULTRAM) 50 MG tablet Take 1 tablet (50 mg total) by mouth every 6 (six) hours as needed for moderate pain. 01/14/19   Rae Halsted, MD  valproic acid (DEPAKENE) 250 MG/5ML SOLN solution Place 125 mg into feeding tube 2 (two) times daily.  11/09/18   [provider]    Allergies    Patient has no known allergies.  Review of Systems   Review of Systems  Unable to perform ROS: Patient unresponsive  Respiratory: Positive for shortness of breath.     Physical Exam Updated Vital Signs BP (!) 141/62 (BP Location: Right Arm)   Pulse 72   Temp (!) 97.5 F (36.4 C) (Oral)   Resp (!) 23   SpO2 100%   Physical Exam Vitals and nursing note reviewed.   79 year old male, tachypneic, but on in no respiratory distress. Vital signs are significant for elevated blood pressure and elevated respiratory rate. Oxygen saturation is 97%, which is normal. Head is normocephalic and atraumatic.  Pupils are 4 mm, gaze is disconjugate.  Unable to test for EOMs. Neck is without adenopathy or JVD. Lungs have diminished breath sounds on the left. Chest moves symmetrically. Heart has regular rate and rhythm without murmur. Abdomen is soft, flat, without masses or hepatosplenomegaly and peristalsis is normoactive.  PEG tube is  present. Extremities have flexion contractures. Skin is warm and dry without rash. Neurologic: Unresponsive to voice and pain.  ED Results / Procedures / Treatments   Labs (all labs ordered are listed, but only abnormal results are displayed) Labs Reviewed  BASIC METABOLIC PANEL - Abnormal; Notable for the following components:      Result Value   Glucose, Bld 157 (*)    BUN 34 (*)    All other components within normal limits  CBC WITH DIFFERENTIAL/PLATELET - Abnormal; Notable for the following components:   RDW 15.7 (*)    All other components within normal limits  SARS CORONAVIRUS 2 BY RT PCR (HOSPITAL ORDER, PERFORMED IN [facility])    Radiology DG Chest 1 View  Result Date: 08/12/2019 CLINICAL DATA:  Short of breath, left pneumothorax, decreased left breath sounds EXAM: CHEST  1 VIEW COMPARISON:  01/07/2019 FINDINGS: Single frontal view  of the chest demonstrates a large left-sided pneumothorax volume estimated greater than 50%. No tension effect or midline shift. Increased vascularity within the right lung consistent with vascular redistribution. No pleural effusion. IMPRESSION: 1. Large greater than 50% left-sided pneumothorax without tension effect. 2. Vascular congestion within the aerated right lung, likely representing vascular redistribution. These results were called by telephone at the time of interpretation on 08/12/2019 at 12:00 am to provider Dexter Sauser [facility] , who verbally acknowledged these results. Electronically Signed   By: Sharlet Salina M.D.   On: 08/12/2019 00:00   DG Chest Portable 1 View  Result Date: 08/12/2019 CLINICAL DATA:  Chest tube placement EXAM: PORTABLE CHEST 1 VIEW COMPARISON:  08/11/2019 FINDINGS: There is no left-sided chest tube. The left lung has re-expanded but there is a small lateral residual pneumothorax. Cardiomediastinal contours and the right lung are unchanged. IMPRESSION: Partial re-expansion of the left lung with small lateral residual  pneumothorax. Electronically Signed   By: Deatra Robinson M.D.   On: 08/12/2019 01:23    Procedures CHEST TUBE INSERTION  Date/Time: 08/12/2019 1:06 AM Performed by: Dione Booze, MD Authorized by: Dione Booze, MD   Consent:    Consent obtained:  Emergent situation   Consent given by: Implied consent - attempted to contact family, but no answer when called on the phone.   Risks discussed:  Damage to surrounding structures, infection and pain   Alternatives discussed:  No treatment Pre-procedure details:    Skin preparation:  ChloraPrep   Preparation: Patient was prepped and draped in the usual sterile fashion   Anesthesia (see MAR for exact dosages):    Anesthesia method:  Local infiltration   Local anesthetic:  Lidocaine 2% WITH epi Procedure details:    Placement location:  L lateral   Scalpel size:  11   Tube size ([place]): 14 pigtail.   Dissection instrument: dilator.   Ultrasound guidance: no     Tension pneumothorax: no     Tube connected to:  Suction   Drainage characteristics:  Air only   Suture material: 4-0 nylon.   Dressing:  4x4 sterile gauze Post-procedure details:    Post-insertion x-ray findings: tube in good position     Patient tolerance of procedure:  Tolerated well, no immediate complications    CRITICAL CARE Performed by: Dione Booze Total critical care time: 45 minutes Critical care time was exclusive of separately billable procedures and treating other patients. Critical care was necessary to treat or prevent imminent or life-threatening deterioration. Critical care was time spent personally by me on the following activities: development of treatment plan with patient and/or surrogate as well as nursing, discussions with consultants, evaluation of patient's response to treatment, examination of patient, obtaining history from patient or surrogate, ordering and performing treatments and interventions, ordering and review of laboratory studies, ordering and  review of radiographic studies, pulse oximetry and re-evaluation of patient's condition.  Medications Ordered in ED Medications - No data to display  ED Course  I have reviewed the triage vital signs and the nursing notes.  Pertinent labs & imaging results that were available during my care of the patient were reviewed by me and considered in my medical decision making (see chart for details).  MDM Rules/Calculators/A&P Left pneumothorax.  Unable to access x-rays done at nursing home, so we will repeat chest x-ray here.  Will need to insert pigtail chest tube.  Old records are reviewed, and he had been admitted last November for sacral osteomyelitis.  12:09 AM  Chest x-ray confirms large left pneumothorax without evidence of tension. X-ray findings were discussed with radiologist. Labs do show elevated BUN suggesting some degree of dehydration.  1:30 AM Pigtail chest tube was inserted without difficulty.  Postprocedure x-ray shows the lung fully expanded.  Case was discussed with Dr. Cornelius Moras of cardiothoracic surgery who requests patient be admitted to the hospitalist service and will see the patient in consult.  Case is discussed with Dr. Toniann Fail of [facility], who agrees to admit the patient.  Final Clinical Impression(s) / ED Diagnoses Final diagnoses:  None    Rx / DC Orders ED Discharge Orders    None       Dione Booze, MD 08/12/19 [phone]

## 2019-08-11 NOTE — ED Triage Notes (Signed)
Pt arrives via [facility] from [facility] nursing home. Pt has reportedly has SOB x24 hrs. X-ray from [facility] reportedly showed L pneumothorax. EMS reports decreased L breath sounds and 94% on 4 lpm [place]. Pt arrives with decreased LOC, not responding to pain, no threat to touch in eyes, gurgling breath sounds and wheezing.

## 2019-08-12 ENCOUNTER — Encounter (HOSPITAL_COMMUNITY): Payer: Self-pay | Admitting: Internal Medicine

## 2019-08-12 ENCOUNTER — Inpatient Hospital Stay (HOSPITAL_COMMUNITY): Payer: Medicare Other

## 2019-08-12 ENCOUNTER — Emergency Department (HOSPITAL_COMMUNITY): Payer: Medicare Other

## 2019-08-12 ENCOUNTER — Other Ambulatory Visit: Payer: Self-pay

## 2019-08-12 DIAGNOSIS — J939 Pneumothorax, unspecified: Secondary | ICD-10-CM | POA: Insufficient documentation

## 2019-08-12 DIAGNOSIS — I10 Essential (primary) hypertension: Secondary | ICD-10-CM | POA: Diagnosis present

## 2019-08-12 DIAGNOSIS — E119 Type 2 diabetes mellitus without complications: Secondary | ICD-10-CM | POA: Diagnosis present

## 2019-08-12 DIAGNOSIS — Z83438 Family history of other disorder of lipoprotein metabolism and other lipidemia: Secondary | ICD-10-CM | POA: Diagnosis not present

## 2019-08-12 DIAGNOSIS — F0281 Dementia in other diseases classified elsewhere with behavioral disturbance: Secondary | ICD-10-CM | POA: Diagnosis present

## 2019-08-12 DIAGNOSIS — G9341 Metabolic encephalopathy: Secondary | ICD-10-CM | POA: Diagnosis present

## 2019-08-12 DIAGNOSIS — Z7189 Other specified counseling: Secondary | ICD-10-CM | POA: Diagnosis not present

## 2019-08-12 DIAGNOSIS — L89154 Pressure ulcer of sacral region, stage 4: Secondary | ICD-10-CM | POA: Diagnosis present

## 2019-08-12 DIAGNOSIS — Z79891 Long term (current) use of opiate analgesic: Secondary | ICD-10-CM | POA: Diagnosis not present

## 2019-08-12 DIAGNOSIS — Z20822 Contact with and (suspected) exposure to covid-19: Secondary | ICD-10-CM | POA: Diagnosis present

## 2019-08-12 DIAGNOSIS — F0151 Vascular dementia with behavioral disturbance: Secondary | ICD-10-CM

## 2019-08-12 DIAGNOSIS — G40909 Epilepsy, unspecified, not intractable, without status epilepticus: Secondary | ICD-10-CM | POA: Diagnosis present

## 2019-08-12 DIAGNOSIS — I480 Paroxysmal atrial fibrillation: Secondary | ICD-10-CM | POA: Diagnosis present

## 2019-08-12 DIAGNOSIS — Z8249 Family history of ischemic heart disease and other diseases of the circulatory system: Secondary | ICD-10-CM | POA: Diagnosis not present

## 2019-08-12 DIAGNOSIS — Z7401 Bed confinement status: Secondary | ICD-10-CM | POA: Diagnosis not present

## 2019-08-12 DIAGNOSIS — J9601 Acute respiratory failure with hypoxia: Secondary | ICD-10-CM | POA: Diagnosis present

## 2019-08-12 DIAGNOSIS — Z79899 Other long term (current) drug therapy: Secondary | ICD-10-CM | POA: Diagnosis not present

## 2019-08-12 DIAGNOSIS — F0391 Unspecified dementia with behavioral disturbance: Secondary | ICD-10-CM | POA: Diagnosis not present

## 2019-08-12 DIAGNOSIS — Z87891 Personal history of nicotine dependence: Secondary | ICD-10-CM | POA: Diagnosis not present

## 2019-08-12 DIAGNOSIS — R627 Adult failure to thrive: Secondary | ICD-10-CM | POA: Diagnosis present

## 2019-08-12 DIAGNOSIS — J9311 Primary spontaneous pneumothorax: Secondary | ICD-10-CM | POA: Diagnosis present

## 2019-08-12 DIAGNOSIS — J449 Chronic obstructive pulmonary disease, unspecified: Secondary | ICD-10-CM | POA: Diagnosis present

## 2019-08-12 DIAGNOSIS — Z515 Encounter for palliative care: Secondary | ICD-10-CM | POA: Diagnosis present

## 2019-08-12 DIAGNOSIS — G309 Alzheimer's disease, unspecified: Secondary | ICD-10-CM | POA: Diagnosis present

## 2019-08-12 DIAGNOSIS — R0602 Shortness of breath: Secondary | ICD-10-CM | POA: Diagnosis present

## 2019-08-12 DIAGNOSIS — Z931 Gastrostomy status: Secondary | ICD-10-CM | POA: Diagnosis not present

## 2019-08-12 DIAGNOSIS — Z7982 Long term (current) use of aspirin: Secondary | ICD-10-CM | POA: Diagnosis not present

## 2019-08-12 DIAGNOSIS — Z66 Do not resuscitate: Secondary | ICD-10-CM | POA: Diagnosis present

## 2019-08-12 LAB — GLUCOSE, CAPILLARY
Glucose-Capillary: 166 mg/dL — ABNORMAL HIGH (ref 70–99)
Glucose-Capillary: 168 mg/dL — ABNORMAL HIGH (ref 70–99)

## 2019-08-12 LAB — CBC WITH DIFFERENTIAL/PLATELET
Abs Immature Granulocytes: 0.02 K/uL (ref 0.00–0.07)
Basophils Absolute: 0 K/uL (ref 0.0–0.1)
Basophils Relative: 0 %
Eosinophils Absolute: 0.2 K/uL (ref 0.0–0.5)
Eosinophils Relative: 3 %
HCT: 46.4 % (ref 39.0–52.0)
Hemoglobin: 14.4 g/dL (ref 13.0–17.0)
Immature Granulocytes: 0 %
Lymphocytes Relative: 25 %
Lymphs Abs: 2 K/uL (ref 0.7–4.0)
MCH: 31.3 pg (ref 26.0–34.0)
MCHC: 31 g/dL (ref 30.0–36.0)
MCV: 100.9 fL — ABNORMAL HIGH (ref 80.0–100.0)
Monocytes Absolute: 0.9 K/uL (ref 0.1–1.0)
Monocytes Relative: 11 %
Neutro Abs: 4.9 K/uL (ref 1.7–7.7)
Neutrophils Relative %: 61 %
Platelets: 167 K/uL (ref 150–400)
RBC: 4.6 MIL/uL (ref 4.22–5.81)
RDW: 15.9 % — ABNORMAL HIGH (ref 11.5–15.5)
WBC: 8.1 K/uL (ref 4.0–10.5)
nRBC: 0 % (ref 0.0–0.2)

## 2019-08-12 LAB — COMPREHENSIVE METABOLIC PANEL
ALT: 15 U/L (ref 0–44)
AST: 24 U/L (ref 15–41)
Albumin: 3.3 g/dL — ABNORMAL LOW (ref 3.5–5.0)
Alkaline Phosphatase: 68 U/L (ref 38–126)
Anion gap: 9 (ref 5–15)
BUN: 30 mg/dL — ABNORMAL HIGH (ref 8–23)
CO2: 28 mmol/L (ref 22–32)
Calcium: 9.2 mg/dL (ref 8.9–10.3)
Chloride: 105 mmol/L (ref 98–111)
Creatinine, Ser: 1.01 mg/dL (ref 0.61–1.24)
GFR calc Af Amer: >60 mL/min (ref >60)
GFR calc non Af Amer: >60 mL/min (ref >60)
Glucose, Bld: 127 mg/dL — ABNORMAL HIGH (ref 70–99)
Potassium: 4.9 mmol/L (ref 3.5–5.1)
Sodium: 142 mmol/L (ref 135–145)
Total Bilirubin: 0.6 mg/dL (ref 0.3–1.2)
Total Protein: 7.9 g/dL (ref 6.5–8.1)

## 2019-08-12 LAB — CBG MONITORING, ED: Glucose-Capillary: 107 mg/dL — ABNORMAL HIGH (ref 70–99)

## 2019-08-12 LAB — HEMOGLOBIN A1C
Hgb A1c MFr Bld: 6.7 % — ABNORMAL HIGH (ref 4.8–5.6)
Mean Plasma Glucose: 145.59 mg/dL

## 2019-08-12 LAB — SARS CORONAVIRUS 2 BY RT PCR (HOSPITAL ORDER, PERFORMED IN ~~LOC~~ HOSPITAL LAB): SARS Coronavirus 2: NEGATIVE

## 2019-08-12 MED ORDER — SENNA 8.6 MG PO TABS
1.0000 | ORAL_TABLET | Freq: Two times a day (BID) | ORAL | Status: DC
  Administered 2019-08-12 – 2019-08-13 (×3): 8.6 mg
  Filled 2019-08-12 (×3): qty 1

## 2019-08-12 MED ORDER — POLYETHYLENE GLYCOL 3350 17 G PO PACK
17.0000 g | PACK | Freq: Two times a day (BID) | ORAL | Status: DC
  Administered 2019-08-12 – 2019-08-13 (×3): 17 g
  Filled 2019-08-12 (×3): qty 1

## 2019-08-12 MED ORDER — LIDOCAINE-EPINEPHRINE (PF) 2 %-1:200000 IJ SOLN
10.0000 mL | Freq: Once | INTRAMUSCULAR | Status: AC
  Administered 2019-08-12: 10 mL
  Filled 2019-08-12: qty 20

## 2019-08-12 MED ORDER — SODIUM CHLORIDE 0.9 % IV BOLUS
1000.0000 mL | Freq: Once | INTRAVENOUS | Status: AC
  Administered 2019-08-12: 1000 mL via INTRAVENOUS

## 2019-08-12 MED ORDER — PRO-STAT SUGAR FREE PO LIQD
30.0000 mL | Freq: Three times a day (TID) | ORAL | Status: DC
  Administered 2019-08-12 – 2019-08-13 (×4): 30 mL
  Filled 2019-08-12 (×5): qty 30

## 2019-08-12 MED ORDER — AMLODIPINE BESYLATE 5 MG PO TABS
5.0000 mg | ORAL_TABLET | Freq: Every day | ORAL | Status: DC
  Administered 2019-08-12 – 2019-08-13 (×2): 5 mg
  Filled 2019-08-12 (×2): qty 1

## 2019-08-12 MED ORDER — MORPHINE SULFATE (PF) 2 MG/ML IV SOLN
0.5000 mg | Freq: Once | INTRAVENOUS | Status: AC
  Administered 2019-08-12: 0.5 mg via INTRAVENOUS
  Filled 2019-08-12: qty 1

## 2019-08-12 MED ORDER — VALPROIC ACID 250 MG/5ML PO SOLN
125.0000 mg | Freq: Two times a day (BID) | ORAL | Status: DC
  Administered 2019-08-12 – 2019-08-14 (×5): 125 mg
  Filled 2019-08-12 (×7): qty 5

## 2019-08-12 MED ORDER — IPRATROPIUM-ALBUTEROL 0.5-2.5 (3) MG/3ML IN SOLN: 3.0000 mL | Freq: Four times a day (QID) | RESPIRATORY_TRACT | Status: DC | PRN

## 2019-08-12 MED ORDER — MORPHINE SULFATE (PF) 4 MG/ML IV SOLN
4.0000 mg | Freq: Once | INTRAVENOUS | Status: AC
  Administered 2019-08-12: 4 mg via INTRAVENOUS
  Filled 2019-08-12: qty 1

## 2019-08-12 MED ORDER — FENTANYL CITRATE (PF) 100 MCG/2ML IJ SOLN
50.0000 ug | Freq: Once | INTRAMUSCULAR | Status: AC
  Administered 2019-08-12: 50 ug via INTRAVENOUS
  Filled 2019-08-12: qty 2

## 2019-08-12 MED ORDER — METOPROLOL TARTRATE 12.5 MG HALF TABLET
12.5000 mg | ORAL_TABLET | Freq: Two times a day (BID) | ORAL | Status: DC
  Administered 2019-08-12 – 2019-08-13 (×3): 12.5 mg
  Filled 2019-08-12 (×3): qty 1

## 2019-08-12 MED ORDER — ONDANSETRON HCL 4 MG PO TABS: 4.0000 mg | ORAL_TABLET | Freq: Four times a day (QID) | ORAL | Status: DC | PRN

## 2019-08-12 MED ORDER — ONDANSETRON HCL 4 MG/2ML IJ SOLN: 4.0000 mg | Freq: Four times a day (QID) | INTRAMUSCULAR | Status: DC | PRN

## 2019-08-12 MED ORDER — LACTULOSE 10 GM/15ML PO SOLN: 20.0000 g | Freq: Every day | ORAL | Status: DC | PRN

## 2019-08-12 MED ORDER — OSMOLITE 1.5 CAL PO LIQD
1000.0000 mL | ORAL | Status: DC
  Administered 2019-08-12 – 2019-08-13 (×2): 1000 mL
  Filled 2019-08-12 (×3): qty 1000

## 2019-08-12 MED ORDER — LEVETIRACETAM 100 MG/ML PO SOLN
750.0000 mg | Freq: Two times a day (BID) | ORAL | Status: DC
  Administered 2019-08-12 – 2019-08-14 (×5): 750 mg
  Filled 2019-08-12 (×7): qty 7.5

## 2019-08-12 MED ORDER — DONEPEZIL HCL 10 MG PO TABS
10.0000 mg | ORAL_TABLET | Freq: Every day | ORAL | Status: DC
  Administered 2019-08-12: 10 mg
  Filled 2019-08-12: qty 1

## 2019-08-12 NOTE — ED Notes (Signed)
Continue NPO Dr. Rueben Bash rounded on pt. No improvement. Pt RR 27 96% on 4 L

## 2019-08-12 NOTE — ED Notes (Signed)
XR called for stat Chest XR

## 2019-08-12 NOTE — ED Notes (Addendum)
Pt destates once again to 89%. Increased oxygen to 4L. Dr. Toniann Fail to consult critical Care. Charge informed

## 2019-08-12 NOTE — ED Notes (Signed)
CCM at bedside 

## 2019-08-12 NOTE — H&P (Addendum)
History and Physical    SANCHEZ HEMMER NAT:[id number] DOB: 05-May-1940 DOA: 08/11/2019  PCP: [name], MD  Patient coming from: Skilled nursing facility.  Chief Complaint: Shortness of breath.  HPI: Joshua Gonzales is a 79 y.o. male with history of dementia, sacral decubitus bedbound seizure disorder was brought to the ER patient was found to be short of breath over the last 24 hours.  X-rays done at the facility was found to have a pneumothorax.  ED Course: In the ER patient was found to be short of breath and chest x-ray confirms a pneumothorax and had a chest tube placed by ER physician following which the pneumothorax improved.  Dr. Constance Goltz cardiothoracic surgery was consulted.  Shortly after that patient became more short of breath repeat chest x-ray shows worsening pneumothorax for which I did discuss with ER physician Dr. Preston Fleeting and after manipulating the chest tube by Dr. Preston Fleeting chest x-ray repeated again still shows pneumothorax but patient is presently hemodynamically stable.    Review of Systems: As per HPI, rest all negative.   Past Medical History:  Diagnosis Date  . Alzheimer disease (HCC)   . Hypertension   . Seizures (HCC)   . Wound of sacral region 01/2019    Past Surgical History:  Procedure Laterality Date  . IR GASTROSTOMY TUBE MOD SED  09/30/2018     reports that he has quit smoking. He has never used smokeless tobacco. He reports current alcohol use. No history on file for drug.  No Known Allergies  Family History  Problem Relation Age of Onset  . Hyperlipidemia Mother   . Hypertension Mother   . Hypertension Father   . Hyperlipidemia Father     Prior to Admission medications   Medication Sig Start Date End Date Taking? Authorizing Provider  acetaminophen (TYLENOL) 325 MG tablet Take 2 tablets (650 mg total) by mouth every 6 (six) hours as needed for mild pain (or Fever >/= 101). Patient taking differently: Place 650 mg into feeding tube every 6  (six) hours as needed for mild pain (or Fever >/= 101).  11/28/18  Yes Rai, Ripudeep K, MD  Amino Acids-Protein Hydrolys (FEEDING SUPPLEMENT, PRO-STAT SUGAR FREE 64,) LIQD Place 30 mLs into feeding tube 2 (two) times a day.    Yes [provider]  amLODipine (NORVASC) 5 MG tablet Take 1 tablet (5 mg total) by mouth daily. Patient taking differently: Place 5 mg into feeding tube daily.  01/14/19 08/12/19 Yes Rae Halsted, MD  aspirin 81 MG chewable tablet Chew 81 mg by mouth daily.   Yes [provider]  donepezil (ARICEPT) 10 MG tablet Place 10 mg into feeding tube at bedtime.    Yes [provider]  furosemide (LASIX) 20 MG tablet Place 20 mg into feeding tube daily.   Yes [provider]  ipratropium-albuterol (DUONEB) 0.5-2.5 (3) MG/3ML SOLN Take 3 mLs by nebulization every 6 (six) hours as needed (for shortness of breath or wheezing).   Yes [provider]  lactulose (CHRONULAC) 10 GM/15ML solution Place 20 g into feeding tube daily as needed for moderate constipation.    Yes [provider]  levETIRAcetam (KEPPRA) 100 MG/ML solution Place 750 mg into feeding tube 2 (two) times daily.    Yes [provider]  metoprolol tartrate (LOPRESSOR) 25 MG tablet Take 0.5 tablets (12.5 mg total) by mouth 2 (two) times daily. 11/28/18  Yes Rai, Ripudeep K, MD  Multiple Vitamins-Minerals (CERTA-VITE PO) Give 1 tablet by  tube daily.    Yes [provider]  nystatin cream (MYCOSTATIN) Apply 1 application topically 2 (two) times daily.  12/23/18  Yes [provider]  polyethylene glycol (MIRALAX / GLYCOLAX) 17 g packet Take 17 g by mouth 2 (two) times daily. Patient taking differently: Place 17 g into feeding tube 2 (two) times daily.  11/28/18  Yes Rai, Ripudeep K, MD  senna (SENOKOT) 8.6 MG TABS tablet Take 1 tablet (8.6 mg total) by mouth 2 (two) times daily. Patient taking differently: Place 1 tablet into feeding tube 2 (two)  times daily.  11/28/18  Yes Rai, Ripudeep K, MD  traMADol (ULTRAM) 50 MG tablet Take 1 tablet (50 mg total) by mouth every 6 (six) hours as needed for moderate pain. Patient taking differently: Place 50 mg into feeding tube every 6 (six) hours as needed for moderate pain.  01/14/19  Yes Rae Halsted, MD  valproic acid (DEPAKENE) 250 MG/5ML SOLN solution Place 125 mg into feeding tube 2 (two) times daily.  11/09/18  Yes [provider]  collagenase (SANTYL) ointment Apply topically daily. Apply to sacral wound daily Patient not taking: Reported on 01/08/2019 11/28/18   Cathren Harsh, MD    Physical Exam: Constitutional: Moderately built and nourished. Vitals:   08/12/19 0403 08/12/19 0500 08/12/19 0515 08/12/19 0545  BP: (!) 139/100 (!) 147/104    Pulse: (!) 110 (!) 111 (!) 110 100  Resp: (!) 24 (!) 30 (!) 22 (!) 29  Temp:      TempSrc:      SpO2: 95% (!) 88% 94% 95%   Eyes: Anicteric no pallor. ENMT: No discharge from the ears eyes nose or mouth. Neck: No mass felt.  No neck rigidity. Respiratory: No rhonchi or crepitations. Cardiovascular: S1-S2 heard. Abdomen: Soft nontender bowel sounds present. Musculoskeletal: No edema.  Chronic skin changes. Skin: Chronic skin changes. Neurologic: Patient is alert oriented to his name.  Otherwise does not follow commands. Psychiatric: Oriented to name.   Labs on Admission: I have personally reviewed following labs and imaging studies  CBC: Recent Labs  Lab 08/11/19 2304  WBC 6.7  NEUTROABS 3.5  HGB 14.7  HCT 46.4  MCV 99.1  PLT 166   Basic Metabolic Panel: Recent Labs  Lab 08/11/19 2304  NA 136  K 4.2  CL 98  CO2 23  GLUCOSE 157*  BUN 34*  CREATININE 0.87  CALCIUM 9.1   GFR: CrCl cannot be calculated (Unknown ideal weight.). Liver Function Tests: No results for input(s): AST, ALT, ALKPHOS, BILITOT, PROT, ALBUMIN in the last 168 hours. No results for input(s): LIPASE, AMYLASE in the last 168 hours. No  results for input(s): AMMONIA in the last 168 hours. Coagulation Profile: No results for input(s): INR, PROTIME in the last 168 hours. Cardiac Enzymes: No results for input(s): CKTOTAL, CKMB, CKMBINDEX, TROPONINI in the last 168 hours. BNP (last 3 results) No results for input(s): PROBNP in the last 8760 hours. HbA1C: No results for input(s): HGBA1C in the last 72 hours. CBG: No results for input(s): GLUCAP in the last 168 hours. Lipid Profile: No results for input(s): CHOL, HDL, LDLCALC, TRIG, CHOLHDL, LDLDIRECT in the last 72 hours. Thyroid Function Tests: No results for input(s): TSH, T4TOTAL, FREET4, T3FREE, THYROIDAB in the last 72 hours. Anemia Panel: No results for input(s): VITAMINB12, FOLATE, FERRITIN, TIBC, IRON, RETICCTPCT in the last 72 hours. Urine analysis:    Component Value Date/Time   COLORURINE YELLOW 01/07/2019 1910   APPEARANCEUR CLEAR 01/07/2019 1910  LABSPEC 1.020 01/07/2019 1910   PHURINE 6.0 01/07/2019 1910   GLUCOSEU NEGATIVE 01/07/2019 1910   HGBUR NEGATIVE 01/07/2019 1910   BILIRUBINUR NEGATIVE 01/07/2019 1910   KETONESUR NEGATIVE 01/07/2019 1910   PROTEINUR 30 (A) 01/07/2019 1910   UROBILINOGEN 0.2 06/03/2014 1649   NITRITE NEGATIVE 01/07/2019 1910   LEUKOCYTESUR LARGE (A) 01/07/2019 1910   Sepsis Labs: @LABRCNTIP (procalcitonin:4,lacticidven:4) ) Recent Results (from the past 240 hour(s))  SARS Coronavirus 2 by RT PCR (hospital order, performed in [facility] hospital lab) Nasopharyngeal Nasopharyngeal Swab     Status: None   Collection Time: 08/11/19 11:08 PM   Specimen: Nasopharyngeal Swab  Result Value Ref Range Status   SARS Coronavirus 2 NEGATIVE NEGATIVE Final    Comment: (NOTE) SARS-CoV-2 target nucleic acids are NOT DETECTED. The SARS-CoV-2 RNA is generally detectable in upper and lower respiratory specimens during the acute phase of infection. The lowest concentration of SARS-CoV-2 viral copies this assay can detect is 250 copies /  mL. A negative result does not preclude SARS-CoV-2 infection and should not be used as the sole basis for treatment or other patient management decisions.  A negative result may occur with improper specimen collection / handling, submission of specimen other than nasopharyngeal swab, presence of viral mutation(s) within the areas targeted by this assay, and inadequate number of viral copies (<250 copies / mL). A negative result must be combined with clinical observations, patient history, and epidemiological information. Fact Sheet for Patients:   10/11/19 Fact Sheet for Healthcare Providers: BoilerBrush.com.cy This test is not yet approved or cleared  by the https://pope.com/ FDA and has been authorized for detection and/or diagnosis of SARS-CoV-2 by FDA under an Emergency Use Authorization (EUA).  This EUA will remain in effect (meaning this test can be used) for the duration of the COVID-19 declaration under Section 564(b)(1) of the Act, 21 U.S.C. section 360bbb-3(b)(1), unless the authorization is terminated or revoked sooner. Performed at [facility], [address] [place], [place]      Radiological Exams on Admission: DG Chest 1 View  Result Date: 08/12/2019 CLINICAL DATA:  Short of breath, left pneumothorax, decreased left breath sounds EXAM: CHEST  1 VIEW COMPARISON:  01/07/2019 FINDINGS: Single frontal view of the chest demonstrates a large left-sided pneumothorax volume estimated greater than 50%. No tension effect or midline shift. Increased vascularity within the right lung consistent with vascular redistribution. No pleural effusion. IMPRESSION: 1. Large greater than 50% left-sided pneumothorax without tension effect. 2. Vascular congestion within the aerated right lung, likely representing vascular redistribution. These results were called by telephone at the time of interpretation on 08/12/2019 at 12:00 am  to provider DAVID [facility] , who verbally acknowledged these results. Electronically Signed   By: [facility] M.D.   On: 08/12/2019 00:00   DG Chest Portable 1 View  Result Date: 08/12/2019 CLINICAL DATA:  Chest tube EXAM: PORTABLE CHEST 1 VIEW COMPARISON:  Earlier today FINDINGS: Sizable left pneumothorax measuring up to 2.8 cm in thickness, unchanged from most recent prior. The chest tube is in stable position. Interstitial opacity on both sides. Cardiomegaly. IMPRESSION: Unchanged moderate to large left pneumothorax when compared to the most recent prior. Electronically Signed   By: 10/12/2019 M.D.   On: 08/12/2019 04:59   DG CHEST PORT 1 VIEW  Result Date: 08/12/2019 CLINICAL DATA:  Hypertension, diabetes, stroke EXAM: PORTABLE CHEST 1 VIEW COMPARISON:  Radiograph 08/12/2019 FINDINGS: Left pigtail pleural drain remains in place in the mid  chest. Interval reexpansion of a now moderate to large left pneumothorax both laterally and medially with a volume estimated at approximately 40-60%. Increasing attenuation within left lung likely reflecting some atelectatic changes. Albeit with more patchy opacity in the lung base. Mixed reticular and patchy opacities are also present in the right lung, similar to the comparison exam with band of opacity in mid lung compatible with scarring. Cardiomediastinal contours are stable. The aorta is calcified. The remaining cardiomediastinal contours are unremarkable. No acute osseous or soft tissue abnormality. Degenerative changes are present in the imaged spine and shoulders. Telemetry leads overlie the chest. IMPRESSION: 1. Interval reexpansion of a now moderate to large left pneumothorax both laterally and medially with a volume estimated at approximately 40-60%. 2. Increasing attenuation within the left lung likely reflecting atelectatic changes. 3. Coarse opacities throughout the right lung are similar to prior. 4.  Aortic Atherosclerosis (ICD10-I70.0). Critical  Value/emergent results were called by telephone at the time of interpretation on 08/12/2019 at 3:49 am to provider [facility] , who verbally acknowledged these results. Electronically Signed   By: Lovena Le M.D.   On: 08/12/2019 03:49   DG Chest Portable 1 View  Result Date: 08/12/2019 CLINICAL DATA:  Chest tube placement EXAM: PORTABLE CHEST 1 VIEW COMPARISON:  08/11/2019 FINDINGS: There is no left-sided chest tube. The left lung has re-expanded but there is a small lateral residual pneumothorax. Cardiomediastinal contours and the right lung are unchanged. IMPRESSION: Partial re-expansion of the left lung with small lateral residual pneumothorax. Electronically Signed   By: Ulyses Jarred M.D.   On: 08/12/2019 01:23     Assessment/Plan Principal Problem:   Acute respiratory failure with hypoxia (HCC) Active Problems:   Essential hypertension, benign   History of seizures   Dementia with behavioral disturbance (HCC)   PAF (paroxysmal atrial fibrillation) (HCC)   Type 2 diabetes mellitus without complication (HCC)   Pneumothorax    1. Spontaneous pneumothorax in the patient with known history of COPD -chest tube has been placed.  Discussed with cardiothoracic surgery Dr. Ricard Dillon.  Pneumothorax improved after patient suction was corrected.  Patient is not a good candidate for surgery.  We will try to reach family.  2. History of seizures on Keppra and Depakote. 3. History of dementia on Aricept. 4. Hypertension on amlodipine and metoprolol. 5. PEG tube feeds. 6. Proximal atrial fibrillation not a candidate for anticoagulation. 7. Diabetes mellitus type 2 per the chart.  Closely follow CBGs. 8. Chronic sacral and multiple wounds.  Since patient has pneumothorax will need close monitoring for any further deterioration in inpatient status.   DVT prophylaxis: SCDs for now may change to Lovenox if no procedure is anticipated. Code Status: DNR. Family Communication: Unable to reach  family. Disposition Plan: Back to facility. Consults called: Cardiothoracic surgery and palliative care. Admission status: Inpatient.   Rise Patience MD [facility] Pager [phone].  If 7PM-7AM, please contact night-coverage [email or web address] Password [email or web address]  08/12/2019, 6:18 AM

## 2019-08-12 NOTE — ED Provider Notes (Signed)
Asked to see the patient again because he developed respiratory distress and repeat x-ray showed worsening of pneumothorax.  Pigtail catheter was repositioned and seemed to be working well.  Will repeat chest x-ray.   Dione Booze, MD 08/12/19 [phone]

## 2019-08-12 NOTE — Consult Note (Addendum)
Consultation Note Date: 08/12/2019   Patient Name: Joshua Gonzales  DOB: October 10, 1940  MRN: [id number]  Age / Sex: 79 y.o., male  PCP: Seward Carol, MD Referring Physician: Elodia Florence., *  Reason for Consultation: Establishing goals of care  HPI/Patient Profile: 79 y.o. male chronically ill male with history of advanced dementia, seizure disorder, DM2 presents to [facility] Emergency Department 08/11/2019 from the skilled nursing facility where he resides with reported increased shortness of breath overnight. Chest x-ray revealed left pneumothorax. Left chest tube was inserted by ED staff with initial chest x-ray showing near complete resolution in pneumothorax. Patient later had episodes of hypoxia and increased O2 requirements, with repeat chest x-ray showing partial lung collapse. Chest tube was adjusted by Pulmonary critical care.  Patient is noted to have a multiple co-morbid conditions, PEG tube, sacral wound, and generalized failure to thrive. He is a long-term resident of [facility]. Palliative care was consulted to assist with goals of care.   Clinical Assessment and Goals of Care: I have reviewed medical records including EPIC notes, labs and imaging, and examined the patient. Patient is lying on ED stretcher, opens eyes to voice. He is very weak, but is able to squeeze my hand and move his toes to command. No family at bedside.   I spoke with Dahlia Byes (niece) to discuss diagnosis, prognosis, GOC, disposition, and options. I introduced Palliative Medicine as specialized medical care for people living with serious illness. It focuses on providing relief from the symptoms and stress of a serious illness.   We discussed a brief life review of the patient. He was born in [place], but has lived in [place] most of his life. Abigail Butts describes her uncle as a "lifetime alcoholic", also stating "he was never self  sufficient". He lived with his parents until the point his health declined and he required SNF. In his prime, he worked at a [employer]. He was never married and has no children.    As far as functional and nutritional status: I spoke with nursing staff from [facility] SN[facility] regarding patient's baseline status--they report that patient is alert and oriented to self only, often combative and resistant to care, completely bedbound, and max assist with any care.   We discussed his current illness (penumothorax) and what it means in the larger context of his ongoing co-morbidities. Natural disease  trajectory of advanced dementia was discussed. I emphasized to Abigail Butts that even without the pneumothorax, he has significant health problems, poor functional and nutritional status, and overall poor quality of life.     Hospice and Palliative Care services outpatient were explained and offered. Patient was enrolled in hospice care in 2020, but Abigail Butts reports she made that decision but then it was revoked by the family. Abigail Butts reports his sisters did not want him in hospice care when they realized it "wouldn't prevent death". Abigail Butts herself seems open to hospice care.   Primary decision maker: Abigail Butts describes herself as the spokesperson of the family, but will  not make any decisions alone without the support of her aunts (patient's sisters) and her cousins (patient's nieces).   I have recommended a family meeting tomorrow to discuss GOC. Toniann Fail will try to arrange this with her family, is doubtful she can get her aunts to agree to meet in person, so I have offered a conference call.    SUMMARY OF RECOMMENDATIONS   - patient is already a DNR (golden DNR form sent from SNF) - family meeting tomorrow to discuss his quality of life and GOC - patient would be eligible for hospice care, will present this to family tomorrow  Code Status/Advance Care Planning:  DNR  Palliative Prophylaxis:   Aspiration, Oral Care and  Turn Reposition  Psycho-social/Spiritual:   Desire for further Chaplaincy support:not asked  Additional Recommendations: Education on Hospice  Prognosis:   < 6 months  Discharge Planning: To Be Determined      Primary Diagnoses: Present on Admission: . Pneumothorax . Acute respiratory failure with hypoxia (HCC) . Essential hypertension, benign . Dementia with behavioral disturbance (HCC) . PAF (paroxysmal atrial fibrillation) (HCC)   I have reviewed the medical record, interviewed the patient and family, and examined the patient. The following aspects are pertinent.  Past Medical History:  Diagnosis Date  . Alzheimer disease (HCC)   . Hypertension   . Seizures (HCC)   . Wound of sacral region 01/2019   Social History   Socioeconomic History  . Marital status: Single    Spouse name: Not on file  . Number of children: Not on file  . Years of education: Not on file  . Highest education level: Not on file  Occupational History  . Not on file  Tobacco Use  . Smoking status: Former Games developer  . Smokeless tobacco: Never Used  Substance and Sexual Activity  . Alcohol use: Yes  . Drug use: Not on file  . Sexual activity: Not on file  Other Topics Concern  . Not on file  Social History Narrative  . Not on file    Scheduled Meds: . amLODipine  5 mg Per Tube Daily  . donepezil  10 mg Per Tube QHS  . feeding supplement (PRO-STAT SUGAR FREE 64)  30 mL Per Tube TID  . levETIRAcetam  750 mg Per Tube BID  . metoprolol tartrate  12.5 mg Per Tube BID  . polyethylene glycol  17 g Per Tube BID  . senna  1 tablet Per Tube BID  . valproic acid  125 mg Per Tube BID   Continuous Infusions: . feeding supplement (OSMOLITE 1.5 CAL)     PRN Meds:.ipratropium-albuterol, lactulose, ondansetron **OR** ondansetron (ZOFRAN) IV  Medications Prior to Admission:  Prior to Admission medications   Medication Sig Start Date End Date Taking? Authorizing Provider  acetaminophen  (TYLENOL) 325 MG tablet Take 2 tablets (650 mg total) by mouth every 6 (six) hours as needed for mild pain (or Fever >/= 101). Patient taking differently: Place 650 mg into feeding tube every 6 (six) hours as needed for mild pain (or Fever >/= 101).  11/28/18  Yes Rai, Ripudeep K, MD  Amino Acids-Protein Hydrolys (FEEDING SUPPLEMENT, PRO-STAT SUGAR FREE 64,) LIQD Place 30 mLs into feeding tube 2 (two) times a day.    Yes [provider]  amLODipine (NORVASC) 5 MG tablet Take 1 tablet (5 mg total) by mouth daily. Patient taking differently: Place 5 mg into feeding tube daily.  01/14/19 08/12/19 Yes Rae Halsted, MD  aspirin 81 MG chewable  tablet Chew 81 mg by mouth daily.   Yes [provider]  donepezil (ARICEPT) 10 MG tablet Place 10 mg into feeding tube at bedtime.    Yes [provider]  furosemide (LASIX) 20 MG tablet Place 20 mg into feeding tube daily.   Yes [provider]  ipratropium-albuterol (DUONEB) 0.5-2.5 (3) MG/3ML SOLN Take 3 mLs by nebulization every 6 (six) hours as needed (for shortness of breath or wheezing).   Yes [provider]  lactulose (CHRONULAC) 10 GM/15ML solution Place 20 g into feeding tube daily as needed for moderate constipation.    Yes [provider]  levETIRAcetam (KEPPRA) 100 MG/ML solution Place 750 mg into feeding tube 2 (two) times daily.    Yes [provider]  metoprolol tartrate (LOPRESSOR) 25 MG tablet Take 0.5 tablets (12.5 mg total) by mouth 2 (two) times daily. 11/28/18  Yes Rai, Ripudeep K, MD  mirtazapine (REMERON) 15 MG tablet Take 15 mg by mouth at bedtime. 07/25/19  Yes [provider]  Multiple Vitamins-Minerals (CERTA-VITE PO) Give 1 tablet by tube daily.    Yes [provider]  nystatin cream (MYCOSTATIN) Apply 1 application topically 2 (two) times daily.  12/23/18  Yes [provider]  polyethylene glycol (MIRALAX / GLYCOLAX) 17 g packet Take 17 g by mouth 2  (two) times daily. Patient taking differently: Place 17 g into feeding tube 2 (two) times daily.  11/28/18  Yes Rai, Ripudeep K, MD  senna (SENOKOT) 8.6 MG TABS tablet Take 1 tablet (8.6 mg total) by mouth 2 (two) times daily. Patient taking differently: Place 1 tablet into feeding tube 2 (two) times daily.  11/28/18  Yes Rai, Ripudeep K, MD  traMADol (ULTRAM) 50 MG tablet Take 1 tablet (50 mg total) by mouth every 6 (six) hours as needed for moderate pain. Patient taking differently: Place 50 mg into feeding tube every 6 (six) hours as needed for moderate pain.  01/14/19  Yes Rae Halsted, MD  valproic acid (DEPAKENE) 250 MG/5ML SOLN solution Place 125 mg into feeding tube 2 (two) times daily.  11/09/18  Yes [provider]  collagenase (SANTYL) ointment Apply topically daily. Apply to sacral wound daily Patient not taking: Reported on 01/08/2019 11/28/18   Cathren Harsh, MD   No Known Allergies Review of Systems  Unable to perform ROS: Patient nonverbal    Physical Exam Vitals reviewed.  Constitutional:      General: He is not in acute distress.    Comments: Chronically ill-appearing  HENT:     Head: Normocephalic and atraumatic.  Cardiovascular:     Rate and Rhythm: Normal rate. Rhythm irregular.  Pulmonary:     Effort: Pulmonary effort is normal.     Comments: Left chest tube O2 at 4L Neurological:     Mental Status: He is lethargic.     Comments: Opens eyes to voice, follows commands, moves extremeties x 4     Vital Signs: BP 132/88   Pulse 84   Temp (!) 97.5 F (36.4 C) (Oral)   Resp 19   SpO2 99%  Pain Scale: CPOT   Pain Score: Asleep   SpO2: SpO2: 99 % O2 Device:SpO2: 99 % O2 Flow Rate: .O2 Flow Rate (L/min): 4 L/min  IO: Intake/output summary: No intake or output data in the 24 hours ending 08/12/19 1034  LBM:   Baseline Weight:   Most recent weight:        Palliative Assessment/Data: 10-20%  Time In: 11:00 Time Out: 12:10 Time Total:  70 minutes Greater than 50%  of this time was spent counseling and coordinating care related to the above assessment and plan.  Signed by: Ocie Bob, AGNP-C Merry Proud, NP   Please contact Palliative Medicine Team phone at [phone] for questions and concerns.  For individual provider: See Loretha Stapler

## 2019-08-12 NOTE — Progress Notes (Addendum)
PROGRESS NOTE    Joshua Gonzales  NWG:[id number] DOB: [birth year] DOA: 08/11/2019 PCP: Seward Carol, MD   Chief Complaint  Patient presents with  . Shortness of Breath  . Chest Injury    Brief Narrative:  Joshua Gonzales is Joshua Gonzales 79 y.o. male with history of dementia, sacral decubitus bedbound seizure disorder was brought to the ER patient was found to be short of breath over the last 24 hours.  X-rays done at the facility was found to have Joshua Gonzales pneumothorax.  ED Course: In the ER patient was found to be short of breath and chest x-ray confirms Joshua Gonzales pneumothorax and had Floyce Bujak chest tube placed by ER physician following which the pneumothorax improved.  Dr. Roxy Manns, cardiothoracic surgery was consulted.  Shortly after that patient became more short of breath repeat chest x-ray shows worsening pneumothorax for which I did discuss with ER physician Dr. Roxanne Mins and after manipulating the chest tube by Dr. Roxanne Mins chest x-ray repeated again still shows pneumothorax but patient is presently hemodynamically stable.    Assessment & Plan:   Principal Problem:   Acute respiratory failure with hypoxia (HCC) Active Problems:   Essential hypertension, benign   History of seizures   Dementia with behavioral disturbance (HCC)   PAF (paroxysmal atrial fibrillation) (HCC)   Type 2 diabetes mellitus without complication (HCC)   Pneumothorax  1. Spontaneous pneumothorax in the patient with known history of COPD - chest tube has been placed.  Discussed with cardiothoracic surgery Dr. Ricard Dillon, appreciate recs.  PCCM following as well.  Chest tube currently to suction.   Per CT surgery, pt not candidate for surgical intervention (if pt dramatically improved, consider large bore chest tube and/or endobronchial valves).  Recommended palliative care consult. 1. CXR 6/8 PM with stable L pneumothorax 2. Palliative care c/s, plan for family meeting 6/9 to discuss Cotton 3. Appreciate CT surgery, PCCM recs 2. Acute Metabolic  Encephalopathy: unclear baseline for pt, hx dementia - 01/2019 d/c summary notes he was alert to name only at that time, may not be far from baseline - per niece, this fluctuates 3. History of seizures on Keppra and Depakote. 4. History of dementia on Aricept.  Delirium precautions. 5. Hypertension on amlodipine and metoprolol. 6. PEG tube feeds. 7. Proximal atrial fibrillation not Omar Gayden candidate for anticoagulation. 8. Diabetes mellitus type 2 per the chart.  Closely follow CBGs.  Follow A1c. 9. Chronic sacral and multiple wounds.  Wound c/s.  Pressure Injury 11/23/18 Sacrum Medial Stage IV - Full thickness tissue loss with exposed bone, tendon or muscle. (Active)  11/23/18 1330  Location: Sacrum  Location Orientation: Medial  Staging: Stage IV - Full thickness tissue loss with exposed bone, tendon or muscle.  Wound Description (Comments):   Present on Admission: Yes     Pressure Injury 11/23/18 Heel Left Unstageable - Full thickness tissue loss in which the base of the ulcer is covered by slough (yellow, Gonzales, gray, green or brown) and/or eschar (Gonzales, brown or black) in the wound bed. (Active)  11/23/18 1330  Location: Heel  Location Orientation: Left  Staging: Unstageable - Full thickness tissue loss in which the base of the ulcer is covered by slough (yellow, Gonzales, gray, green or brown) and/or eschar (Gonzales, brown or black) in the wound bed.  Wound Description (Comments):   Present on Admission: Yes     Pressure Injury 11/23/18 Heel Right Unstageable - Full thickness tissue loss in which the base of the ulcer is covered by slough (  yellow, Gonzales, gray, green or brown) and/or eschar (Gonzales, brown or black) in the wound bed. (Active)  11/23/18 1330  Location: Heel  Location Orientation: Right  Staging: Unstageable - Full thickness tissue loss in which the base of the ulcer is covered by slough (yellow, Gonzales, gray, green or brown) and/or eschar (Gonzales, brown or black) in the wound bed.  Wound  Description (Comments):   Present on Admission: Yes     Pressure Injury 11/25/18 Sacrum Medial Stage IV - Full thickness tissue loss with exposed bone, tendon or muscle. greenish color to drainage, open wound, palpable sacrum( Physical Therapy) (Active)  11/25/18 1030  Location: Sacrum  Location Orientation: Medial  Staging: Stage IV - Full thickness tissue loss with exposed bone, tendon or muscle.  Wound Description (Comments): greenish color to drainage, open wound, palpable sacrum( Physical Therapy)  Present on Admission:    DVT prophylaxis: (SCD Code Status: DNR Family Communication: none at bedside Disposition:   Status is: Inpatient  Remains inpatient appropriate because:Inpatient level of care appropriate due to severity of illness   Dispo: The patient is from: SNF              Anticipated d/c is to: pending              Anticipated d/c date is: > 3 days              Patient currently is not medically stable to d/c.       Consultants:   PCCM  CT surgery  Procedures:  Chest tube placement 6/7  Antimicrobials:  Anti-infectives (From admission, onward)   None     Subjective: Does not speak for me  Objective: Vitals:   08/12/19 1400 08/12/19 1430 08/12/19 1459 08/12/19 1500  BP: 102/73 103/75  107/73  Pulse: 64 (!) 41 (!) 42 73  Resp: 20 20  (!) 22  Temp:      TempSrc:      SpO2: 97% (!) 89% 95%     Intake/Output Summary (Last 24 hours) at 08/12/2019 1530 Last data filed at 08/12/2019 1054 Gross per 24 hour  Intake 300 ml  Output --  Net 300 ml   There were no vitals filed for this visit.  Examination:  General exam: Appears calm and comfortable  Respiratory system: Clear to auscultation. Respiratory effort normal. Cardiovascular system: S1 & S2 heard, RRR Gastrointestinal system: Abdomen is nondistended, soft and nontender. PEG Central nervous system: arouses slightly to his name stated loudly, but does not follow commands  Extremities: no  LEE  Data Reviewed: I have personally reviewed following labs and imaging studies  CBC: Recent Labs  Lab 08/11/19 2304 08/12/19 0832  WBC 6.7 8.1  NEUTROABS 3.5 4.9  HGB 14.7 14.4  HCT 46.4 46.4  MCV 99.1 100.9*  PLT 166 167    Basic Metabolic Panel: Recent Labs  Lab 08/11/19 2304 08/12/19 0832  NA 136 142  K 4.2 4.9  CL 98 105  CO2 23 28  GLUCOSE 157* 127*  BUN 34* 30*  CREATININE 0.87 1.01  CALCIUM 9.1 9.2    GFR: CrCl cannot be calculated (Unknown ideal weight.).  Liver Function Tests: Recent Labs  Lab 08/12/19 0832  AST 24  ALT 15  ALKPHOS 68  BILITOT 0.6  PROT 7.9  ALBUMIN 3.3*    CBG: No results for input(s): GLUCAP in the last 168 hours.   Recent Results (from the past 240 hour(s))  SARS Coronavirus 2 by RT PCR (  hospital order, performed in [facility] hospital lab) Nasopharyngeal Nasopharyngeal Swab     Status: None   Collection Time: 08/11/19 11:08 PM   Specimen: Nasopharyngeal Swab  Result Value Ref Range Status   SARS Coronavirus 2 NEGATIVE NEGATIVE Final    Comment: (NOTE) SARS-CoV-2 target nucleic acids are NOT DETECTED. The SARS-CoV-2 RNA is generally detectable in upper and lower respiratory specimens during the acute phase of infection. The lowest concentration of SARS-CoV-2 viral copies this assay can detect is 250 copies / mL. Eiman Maret negative result does not preclude SARS-CoV-2 infection and should not be used as the sole basis for treatment or other patient management decisions.  Kiowa Peifer negative result may occur with improper specimen collection / handling, submission of specimen other than nasopharyngeal swab, presence of viral mutation(s) within the areas targeted by this assay, and inadequate number of viral copies (<250 copies / mL). Markell Sciascia negative result must be combined with clinical observations, patient history, and epidemiological information. Fact Sheet for Patients:   BoilerBrush.com.cy Fact Sheet for  Healthcare Providers: https://pope.com/ This test is not yet approved or cleared  by the [place] FDA and has been authorized for detection and/or diagnosis of SARS-CoV-2 by FDA under an Emergency Use Authorization (EUA).  This EUA will remain in effect (meaning this test can be used) for the duration of the COVID-19 declaration under Section 564(b)(1) of the Act, 21 U.S.C. section 360bbb-3(b)(1), unless the authorization is terminated or revoked sooner. Performed at [facility], [address] [place], [place] [address]          Radiology Studies: DG Chest 1 View  Result Date: 08/12/2019 CLINICAL DATA:  Short of breath, left pneumothorax, decreased left breath sounds EXAM: CHEST  1 VIEW COMPARISON:  01/07/2019 FINDINGS: Single frontal view of the chest demonstrates Xyla Leisner large left-sided pneumothorax volume estimated greater than 50%. No tension effect or midline shift. Increased vascularity within the right lung consistent with vascular redistribution. No pleural effusion. IMPRESSION: 1. Large greater than 50% left-sided pneumothorax without tension effect. 2. Vascular congestion within the aerated right lung, likely representing vascular redistribution. These results were called by telephone at the time of interpretation on 08/12/2019 at 12:00 am to provider DAVID [facility] , who verbally acknowledged these results. Electronically Signed   By: Sharlet Salina M.D.   On: 08/12/2019 00:00   DG CHEST PORT 1 VIEW  Result Date: 08/12/2019 CLINICAL DATA:  Follow-up left-sided chest tube EXAM: PORTABLE CHEST 1 VIEW COMPARISON:  Film from earlier in the same day. FINDINGS: Left chest tube is again identified and stable. Small left pneumothorax is seen stable in appearance from the prior exam. No new focal infiltrate or effusion is seen. Coarsened interstitial markings are again noted. Cardiac shadow is stable. IMPRESSION: Stable left pneumothorax with chest tube in place.  Electronically Signed   By: Alcide Clever M.D.   On: 08/12/2019 12:27   DG Chest Portable 1 View  Result Date: 08/12/2019 CLINICAL DATA:  Chest tube EXAM: PORTABLE CHEST 1 VIEW COMPARISON:  4:30 Kylyn Mcdade.m. today FINDINGS: Decreased left pneumothorax, now small to moderate volume. The chest tube has Sable Knoles more open retention loop but is in stable position. Cardiomegaly and interstitial coarsening. IMPRESSION: Decreasing left pneumothorax. Electronically Signed   By: Marnee Spring M.D.   On: 08/12/2019 06:59   DG Chest Portable 1 View  Result Date: 08/12/2019 CLINICAL DATA:  Chest tube EXAM: PORTABLE CHEST 1 VIEW COMPARISON:  Earlier today FINDINGS: Sizable left pneumothorax measuring up to  2.8 cm in thickness, unchanged from most recent prior. The chest tube is in stable position. Interstitial opacity on both sides. Cardiomegaly. IMPRESSION: Unchanged moderate to large left pneumothorax when compared to the most recent prior. Electronically Signed   By: Marnee Spring M.D.   On: 08/12/2019 04:59   DG CHEST PORT 1 VIEW  Result Date: 08/12/2019 CLINICAL DATA:  Hypertension, diabetes, stroke EXAM: PORTABLE CHEST 1 VIEW COMPARISON:  Radiograph 08/12/2019 FINDINGS: Left pigtail pleural drain remains in place in the mid chest. Interval reexpansion of Jla Reynolds now moderate to large left pneumothorax both laterally and medially with Davi Kroon volume estimated at approximately 40-60%. Increasing attenuation within left lung likely reflecting some atelectatic changes. Albeit with more patchy opacity in the lung base. Mixed reticular and patchy opacities are also present in the right lung, similar to the comparison exam with band of opacity in mid lung compatible with scarring. Cardiomediastinal contours are stable. The aorta is calcified. The remaining cardiomediastinal contours are unremarkable. No acute osseous or soft tissue abnormality. Degenerative changes are present in the imaged spine and shoulders. Telemetry leads overlie the  chest. IMPRESSION: 1. Interval reexpansion of Morgin Halls now moderate to large left pneumothorax both laterally and medially with Bunnie Lederman volume estimated at approximately 40-60%. 2. Increasing attenuation within the left lung likely reflecting atelectatic changes. 3. Coarse opacities throughout the right lung are similar to prior. 4.  Aortic Atherosclerosis (ICD10-I70.0). Critical Value/emergent results were called by telephone at the time of interpretation on 08/12/2019 at 3:49 am to provider [facility] , who verbally acknowledged these results. Electronically Signed   By: Kreg Shropshire M.D.   On: 08/12/2019 03:49   DG Chest Portable 1 View  Result Date: 08/12/2019 CLINICAL DATA:  Chest tube placement EXAM: PORTABLE CHEST 1 VIEW COMPARISON:  08/11/2019 FINDINGS: There is no left-sided chest tube. The left lung has re-expanded but there is Deshundra Waller small lateral residual pneumothorax. Cardiomediastinal contours and the right lung are unchanged. IMPRESSION: Partial re-expansion of the left lung with small lateral residual pneumothorax. Electronically Signed   By: Deatra Robinson M.D.   On: 08/12/2019 01:23        Scheduled Meds: . amLODipine  5 mg Per Tube Daily  . donepezil  10 mg Per Tube QHS  . feeding supplement (PRO-STAT SUGAR FREE 64)  30 mL Per Tube TID  . levETIRAcetam  750 mg Per Tube BID  . metoprolol tartrate  12.5 mg Per Tube BID  . polyethylene glycol  17 g Per Tube BID  . senna  1 tablet Per Tube BID  . valproic acid  125 mg Per Tube BID   Continuous Infusions: . feeding supplement (OSMOLITE 1.5 CAL)       LOS: 0 days    Time spent: over 30 min    Lacretia Nicks, MD [facility]   To contact the attending provider between 7A-7P or the covering provider during after hours 7P-7A, please log into the web site [email or web address] and access using universal [place] password for that web site. If you do not have the password, please call the hospital operator.  08/12/2019, 3:30 PM

## 2019-08-12 NOTE — ED Notes (Signed)
Paged admitting Dr. Toniann Fail due change in pt's status.   Noted decrease of SpO2 88% on 3 L of oxygen. Pt placed on 5 L O2, RR 30s, tachy 113,and no breath sounds left lung. Orders for stat chest xray obtained. Dr. Toniann Fail reviewed. Dr. Preston Fleeting at bedside. Chest tube adjusted. Pending repeat chest XR.   0.5 morphine given for comfort. Pt moaning.   Currently RR 27, 97% on 3 L, ST 110. Diminished breath sounds left upper lobe, no breath sound lower left lobes.

## 2019-08-12 NOTE — Consult Note (Addendum)
NAME:  Joshua Gonzales, MRN:  [id number], DOB:  13-Oct-1940, LOS: 0 ADMISSION DATE:  08/11/2019, CONSULTATION DATE: 08/12/2019 REFERRING MD: [facility], CHIEF COMPLAINT: Malfunctioning chest tube  Brief History   79 year old male failure to thrive.  History of present illness   79 year old frail male multiple medical comorbidities he was very short of breath at the skilled nursing facility where he resides.  Chest x-ray revealed left pneumothorax.  He was transferred to [facility] emergency department left chest tube inserted per the emergency department physician with partial resolution left pneumothorax.  Chest tube was not functioning properly therefore pulmonary critical care was called to the bedside to evaluate and solve the problem.  Chest tube was adjusted air leak was noted with no acute distress per the patient.  He is a DNR.  Palliative care has been called.  He has extensive sacral ulcer, PEG tube in place generalized failure to thrive in a poor quality of life.  Pulmonary critical care will be available to help manage the chest tube at this time.  Past Medical History  Dementia Alzheimer's PEG tube Seizure disorder Failure to thrive DNR   Significant Hospital Events   Left chest tube insertion  Consults:  08/12/2019 pulmonary critical care 08/12/2019 palliative care  Procedures:  08/12/2019 left chest tube insertion per emergency department physician  Significant Diagnostic Tests:    Micro Data:    Antimicrobials:    Interim history/subjective:   Left chest tube insertion pneumothorax remains Objective   Blood pressure 117/84, pulse (!) 101, temperature (!) 97.5 F (36.4 C), temperature source Oral, resp. rate (!) 26, SpO2 96 %.       No intake or output data in the 24 hours ending 08/12/19 0735 There were no vitals filed for this visit.  Examination: General:  contracted male is poorly responsive HENT: No JVD or lymphadenopathy is appreciated poor dentition  noted Lungs: Decreased breath sounds on the left.  Left chest tube in place well approximated by chest x-ray with air leak noted.    Left ct in place with reduced pnx  Cardiovascular: Heart sounds are regular Abdomen: Positive bowel sounds PEG tube noted to be in place    Extremities: Noted to have contractions extremities no overt ulceration heels extremities noted Neuro: Poorly responsive guttural sounds are noted GU: Voids Skin: Noted to have sacral decubitus     Resolved Hospital Problem list     Assessment & Plan:  Spontaneous left pneumothorax requiring chest tube insertion on 08/12/2019 per   Emergency department physician.  Note pneumothorax is decreased in size but remains. Chest tube to 20 cm of suction Serial chest x-ray Transition to waterseal in future if possible Note he has a DNR  Alzheimer's disease, dementia, bedridden, contracted, PEG tube in place Severe dementia Noted to be contracted Per primary  Seizure disorder Keppra and valproic acid per primary  Hypertension Per primary  Generalized failure to thrive in adult with multiple medical comorbidities Palliative care has been called  Best practice:  Diet: tube feeds Pain/Anxiety/Delirium protocol (if indicated): na VAP protocol (if indicated): na DVT prophylaxis: scd GI prophylaxis: ppi Glucose control: na Mobility: bedridden Code Status: dnr Family Communication: none available Disposition: none at bedside  Labs   CBC: Recent Labs  Lab 08/11/19 2304  WBC 6.7  NEUTROABS 3.5  HGB 14.7  HCT 46.4  MCV 99.1  PLT 166    Basic Metabolic Panel: Recent Labs  Lab 08/11/19 2304  NA 136  K 4.2  CL 98  CO2 23  GLUCOSE 157*  BUN 34*  CREATININE 0.87  CALCIUM 9.1   GFR: CrCl cannot be calculated (Unknown ideal weight.). Recent Labs  Lab 08/11/19 2304  WBC 6.7    Liver Function Tests: No results for input(s): AST, ALT, ALKPHOS, BILITOT, PROT, ALBUMIN in the last 168  hours. No results for input(s): LIPASE, AMYLASE in the last 168 hours. No results for input(s): AMMONIA in the last 168 hours.  ABG    Component Value Date/Time   HCO3 28.6 (H) 08/16/2018 1117   TCO2 29 08/16/2018 1117   TCO2 30 08/16/2018 1117   O2SAT 99.0 08/16/2018 1117     Coagulation Profile: No results for input(s): INR, PROTIME in the last 168 hours.  Cardiac Enzymes: No results for input(s): CKTOTAL, CKMB, CKMBINDEX, TROPONINI in the last 168 hours.  HbA1C: Hgb A1c MFr Bld  Date/Time Value Ref Range Status  11/24/2018 04:27 AM 5.8 (H) 4.8 - 5.6 % Final    Comment:    (NOTE) Pre diabetes:          5.7%-6.4% Diabetes:              >6.4% Glycemic control for   <7.0% adults with diabetes   05/15/2014 07:20 PM 6.5 (H) 4.8 - 5.6 % Final    Comment:    (NOTE)         Pre-diabetes: 5.7 - 6.4         Diabetes: >6.4         Glycemic control for adults with diabetes: <7.0     CBG: No results for input(s): GLUCAP in the last 168 hours.  Review of Systems:   na  Past Medical History  He,  has a past medical history of Alzheimer disease (HCC), Hypertension, Seizures (HCC), and Wound of sacral region (01/2019).   Surgical History    Past Surgical History:  Procedure Laterality Date   IR GASTROSTOMY TUBE MOD SED  09/30/2018     Social History   reports that he has quit smoking. He has never used smokeless tobacco. He reports current alcohol use.   Family History   His family history includes Hyperlipidemia in his father and mother; Hypertension in his father and mother.   Allergies No Known Allergies   Home Medications  Prior to Admission medications   Medication Sig Start Date End Date Taking? Authorizing Provider  acetaminophen (TYLENOL) 325 MG tablet Take 2 tablets (650 mg total) by mouth every 6 (six) hours as needed for mild pain (or Fever >/= 101). Patient taking differently: Place 650 mg into feeding tube every 6 (six) hours as needed for mild pain  (or Fever >/= 101).  11/28/18  Yes Rai, Ripudeep K, MD  Amino Acids-Protein Hydrolys (FEEDING SUPPLEMENT, PRO-STAT SUGAR FREE 64,) LIQD Place 30 mLs into feeding tube 2 (two) times a day.    Yes [provider]  amLODipine (NORVASC) 5 MG tablet Take 1 tablet (5 mg total) by mouth daily. Patient taking differently: Place 5 mg into feeding tube daily.  01/14/19 08/12/19 Yes Rae Halsted, MD  aspirin 81 MG chewable tablet Chew 81 mg by mouth daily.   Yes [provider]  donepezil (ARICEPT) 10 MG tablet Place 10 mg into feeding tube at bedtime.    Yes [provider]  furosemide (LASIX) 20 MG tablet Place 20 mg into feeding tube daily.   Yes [provider]  ipratropium-albuterol (DUONEB) 0.5-2.5 (3) MG/3ML SOLN Take 3 mLs by  nebulization every 6 (six) hours as needed (for shortness of breath or wheezing).   Yes [provider]  lactulose (CHRONULAC) 10 GM/15ML solution Place 20 g into feeding tube daily as needed for moderate constipation.    Yes [provider]  levETIRAcetam (KEPPRA) 100 MG/ML solution Place 750 mg into feeding tube 2 (two) times daily.    Yes [provider]  metoprolol tartrate (LOPRESSOR) 25 MG tablet Take 0.5 tablets (12.5 mg total) by mouth 2 (two) times daily. 11/28/18  Yes Rai, Ripudeep K, MD  Multiple Vitamins-Minerals (CERTA-VITE PO) Give 1 tablet by tube daily.    Yes [provider]  nystatin cream (MYCOSTATIN) Apply 1 application topically 2 (two) times daily.  12/23/18  Yes [provider]  polyethylene glycol (MIRALAX / GLYCOLAX) 17 g packet Take 17 g by mouth 2 (two) times daily. Patient taking differently: Place 17 g into feeding tube 2 (two) times daily.  11/28/18  Yes Rai, Ripudeep K, MD  senna (SENOKOT) 8.6 MG TABS tablet Take 1 tablet (8.6 mg total) by mouth 2 (two) times daily. Patient taking differently: Place 1 tablet into feeding tube 2 (two) times daily.  11/28/18  Yes Rai,  Ripudeep K, MD  traMADol (ULTRAM) 50 MG tablet Take 1 tablet (50 mg total) by mouth every 6 (six) hours as needed for moderate pain. Patient taking differently: Place 50 mg into feeding tube every 6 (six) hours as needed for moderate pain.  01/14/19  Yes Charolotte Capuchin, MD  valproic acid (DEPAKENE) 250 MG/5ML SOLN solution Place 125 mg into feeding tube 2 (two) times daily.  11/09/18  Yes [provider]  collagenase (SANTYL) ointment Apply topically daily. Apply to sacral wound daily Patient not taking: Reported on 01/08/2019 11/28/18   Mendel Corning, MD     Critical care time: [place] Nurse Practitioner [place] Please consult Amion 08/12/2019, 7:35 AM   Pulmonary critical care attending:  This 79 year old gentleman multiple medical comorbidities, resident of skilled nursing facility.  Presented with failure to thrive, dementia Alzheimer's.  Patient is a DNR.  Found to have a left-sided pneumothorax.  Chest tube was placed by her emergency room.  Pulmonary was consulted for recommendation management for chest drain.  Last night there was issues with drainage from the chest tube.  Repositioning of the tube help with removal of air.  At this time stable but patient does have a persistent air leak on exam.  BP 117/81    Pulse 84    Temp (!) 97.5 F (36.4 C) (Oral)    Resp 19    SpO2 96%   General: Elderly debilitated frail Chest: Left chest drain in place. Heart: Regular rhythm S1-S2 Lungs: Clear to auscultation bilaterally. Abdomen: Soft nondistended nondistended, PEG tube in place. Extremities: Muscle wasting present.  Labs: Reviewed Chest x-ray: Expansion of lung, chest tube in place, small pneumothorax still present.  Assessment: Left-sided pneumothorax requiring pigtail catheter placement Alzheimer's disease, dementia, contractions, PEG tube in place, resident of skilled nursing facility Sacral wound pictured above.   Present on admission  Plan: Agree with palliative care discussion. Currently has an air leak unable to transition to waterseal.  Keep on suction at this time. -20 cm water suction Pulmonary will follow for chest tube management.  Garner Nash, DO [place] [facility] 08/12/2019 12:48 PM

## 2019-08-12 NOTE — Progress Notes (Signed)
I came to bedside to assess the patient since he had a chest tube on the left side for pneumothorax with no improvement. I did manipulate it and flushed it and started having good airleak. I immediately did a stat CXR which showed improvement in lung expansion though still there is pneumothorax nut improved dramatically. Patient will be admitted by morning team and will need a repeat CXR in 6 hours to confirm complete expansion.

## 2019-08-12 NOTE — ED Notes (Signed)
L chest tube inserted at bedside by MD Preston Fleeting.

## 2019-08-12 NOTE — Plan of Care (Signed)

## 2019-08-12 NOTE — Consult Note (Addendum)
[address]       [place] [address]             [phone]          CARDIOTHORACIC SURGERY CONSULTATION REPORT  PCP is [name], MD Referring Provider is Dione Booze, MD  Reason for consultation:  Spontaneous pneumothorax  HPI:  Patient is a 79 year old male with reported history of stroke in the distant past, Alzheimer's disease, seizure disorder, and severe chronic dementia who remains severely debilitated and bedridden with PEG tube in place at a local nursing home with active DNR order in place.  Patient was brought to the emergency department overnight with reported history of increased shortness of breath and found to have large left spontaneous pneumothorax.  Small bore chest tube was placed by the emergency department staff.  Initial chest x-ray following chest tube placement demonstrated near complete resolution of the pneumothorax with appropriate tube position, but since that time the patient has had some episodes of oxygen desaturation with increased O2 requirement and repeat chest x-rays have demonstrated partial collapse of the lung.  Cardiothoracic surgical consultation was requested.  Patient open eyes and has purposeful responses to noxious stimuli.  He denies ongoing pain and appears to be breathing comfortably.  He has no capacity to answer questions or provide any review of systems.  According to emergency department staff despite multiple attempts so far no family contact members have been reached to discuss the fact the patient is in the hospital or provide informed consent for any subsequent care.  Limited medical history is available and no long-term medical records from the patient's nursing home or primary care provider are currently available.  Past Medical History:  Diagnosis Date   Alzheimer disease (HCC)    Hypertension    Seizures (HCC)    Wound of sacral region 01/2019    Past Surgical History:  Procedure Laterality  Date   IR GASTROSTOMY TUBE MOD SED  09/30/2018    Family History  Problem Relation Age of Onset   Hyperlipidemia Mother    Hypertension Mother    Hypertension Father    Hyperlipidemia Father     Social History   Socioeconomic History   Marital status: Single    Spouse name: Not on file   Number of children: Not on file   Years of education: Not on file   Highest education level: Not on file  Occupational History   Not on file  Tobacco Use   Smoking status: Former Smoker  Substance and Sexual Activity   Alcohol use: Yes   Drug use: Not on file   Sexual activity: Not on file  Other Topics Concern   Not on file  Social History Narrative   Not on file   Social Determinants of Health   Financial Resource Strain:    Difficulty of Paying Living Expenses:   Food Insecurity:    Worried About Programme researcher, broadcasting/film/video in the Last Year:    Barista in the Last Year:   Transportation Needs:    Freight forwarder (Medical):    Lack of Transportation (Non-Medical):   Physical Activity:    Days of Exercise per Week:    Minutes of Exercise per Session:   Stress:    Feeling of Stress :   Social Connections:    Frequency of Communication with Friends and Family:    Frequency of Social Gatherings with Friends and Family:  Attends Religious Services:    Active Member of Clubs or Organizations:    Attends Music therapist:    Marital Status:   Intimate Partner Violence:    Fear of Current or Ex-Partner:    Emotionally Abused:    Physically Abused:    Sexually Abused:     Prior to Admission medications   Medication Sig Start Date End Date Taking? Authorizing Provider  acetaminophen (TYLENOL) 325 MG tablet Take 2 tablets (650 mg total) by mouth every 6 (six) hours as needed for mild pain (or Fever >/= 101). Patient taking differently: Place 650 mg into feeding tube every 6 (six) hours as needed for mild pain (or Fever >/=  101).  11/28/18  Yes Rai, Ripudeep K, MD  Amino Acids-Protein Hydrolys (FEEDING SUPPLEMENT, PRO-STAT SUGAR FREE 64,) LIQD Place 30 mLs into feeding tube 2 (two) times a day.    Yes [provider]  amLODipine (NORVASC) 5 MG tablet Take 1 tablet (5 mg total) by mouth daily. Patient taking differently: Place 5 mg into feeding tube daily.  01/14/19 08/12/19 Yes Charolotte Capuchin, MD  aspirin 81 MG chewable tablet Chew 81 mg by mouth daily.   Yes [provider]  donepezil (ARICEPT) 10 MG tablet Place 10 mg into feeding tube at bedtime.    Yes [provider]  furosemide (LASIX) 20 MG tablet Place 20 mg into feeding tube daily.   Yes [provider]  ipratropium-albuterol (DUONEB) 0.5-2.5 (3) MG/3ML SOLN Take 3 mLs by nebulization every 6 (six) hours as needed (for shortness of breath or wheezing).   Yes [provider]  lactulose (CHRONULAC) 10 GM/15ML solution Place 20 g into feeding tube daily as needed for moderate constipation.    Yes [provider]  levETIRAcetam (KEPPRA) 100 MG/ML solution Place 750 mg into feeding tube 2 (two) times daily.    Yes [provider]  metoprolol tartrate (LOPRESSOR) 25 MG tablet Take 0.5 tablets (12.5 mg total) by mouth 2 (two) times daily. 11/28/18  Yes Rai, Ripudeep K, MD  Multiple Vitamins-Minerals (CERTA-VITE PO) Give 1 tablet by tube daily.    Yes [provider]  nystatin cream (MYCOSTATIN) Apply 1 application topically 2 (two) times daily.  12/23/18  Yes [provider]  polyethylene glycol (MIRALAX / GLYCOLAX) 17 g packet Take 17 g by mouth 2 (two) times daily. Patient taking differently: Place 17 g into feeding tube 2 (two) times daily.  11/28/18  Yes Rai, Ripudeep K, MD  senna (SENOKOT) 8.6 MG TABS tablet Take 1 tablet (8.6 mg total) by mouth 2 (two) times daily. Patient taking differently: Place 1 tablet into feeding tube 2 (two) times daily.  11/28/18  Yes Rai, Ripudeep K, MD    traMADol (ULTRAM) 50 MG tablet Take 1 tablet (50 mg total) by mouth every 6 (six) hours as needed for moderate pain. Patient taking differently: Place 50 mg into feeding tube every 6 (six) hours as needed for moderate pain.  01/14/19  Yes Charolotte Capuchin, MD  valproic acid (DEPAKENE) 250 MG/5ML SOLN solution Place 125 mg into feeding tube 2 (two) times daily.  11/09/18  Yes [provider]  collagenase (SANTYL) ointment Apply topically daily. Apply to sacral wound daily Patient not taking: Reported on 01/08/2019 11/28/18   Rai, Vernelle Emerald, MD    No current facility-administered medications for this encounter.   Current Outpatient Medications  Medication Sig Dispense Refill   acetaminophen (TYLENOL) 325 MG tablet Take 2 tablets (650  mg total) by mouth every 6 (six) hours as needed for mild pain (or Fever >/= 101). (Patient taking differently: Place 650 mg into feeding tube every 6 (six) hours as needed for mild pain (or Fever >/= 101). )     Amino Acids-Protein Hydrolys (FEEDING SUPPLEMENT, PRO-STAT SUGAR FREE 64,) LIQD Place 30 mLs into feeding tube 2 (two) times a day.      amLODipine (NORVASC) 5 MG tablet Take 1 tablet (5 mg total) by mouth daily. (Patient taking differently: Place 5 mg into feeding tube daily. ) 30 tablet 3   aspirin 81 MG chewable tablet Chew 81 mg by mouth daily.     donepezil (ARICEPT) 10 MG tablet Place 10 mg into feeding tube at bedtime.      furosemide (LASIX) 20 MG tablet Place 20 mg into feeding tube daily.     ipratropium-albuterol (DUONEB) 0.5-2.5 (3) MG/3ML SOLN Take 3 mLs by nebulization every 6 (six) hours as needed (for shortness of breath or wheezing).     lactulose (CHRONULAC) 10 GM/15ML solution Place 20 g into feeding tube daily as needed for moderate constipation.      levETIRAcetam (KEPPRA) 100 MG/ML solution Place 750 mg into feeding tube 2 (two) times daily.      metoprolol tartrate (LOPRESSOR) 25 MG tablet Take 0.5 tablets (12.5 mg  total) by mouth 2 (two) times daily.     Multiple Vitamins-Minerals (CERTA-VITE PO) Give 1 tablet by tube daily.      nystatin cream (MYCOSTATIN) Apply 1 application topically 2 (two) times daily.      polyethylene glycol (MIRALAX / GLYCOLAX) 17 g packet Take 17 g by mouth 2 (two) times daily. (Patient taking differently: Place 17 g into feeding tube 2 (two) times daily. ) 14 each 0   senna (SENOKOT) 8.6 MG TABS tablet Take 1 tablet (8.6 mg total) by mouth 2 (two) times daily. (Patient taking differently: Place 1 tablet into feeding tube 2 (two) times daily. ) 120 tablet 0   traMADol (ULTRAM) 50 MG tablet Take 1 tablet (50 mg total) by mouth every 6 (six) hours as needed for moderate pain. (Patient taking differently: Place 50 mg into feeding tube every 6 (six) hours as needed for moderate pain. ) 15 tablet 0   valproic acid (DEPAKENE) 250 MG/5ML SOLN solution Place 125 mg into feeding tube 2 (two) times daily.      collagenase (SANTYL) ointment Apply topically daily. Apply to sacral wound daily (Patient not taking: Reported on 01/08/2019) 15 g 0    No Known Allergies    Review of Systems:  Per HPI.  Otherwise not obtainable     Physical Exam:   BP (!) 147/104    Pulse 100    Temp (!) 97.5 F (36.4 C) (Oral)    Resp (!) 29    SpO2 95%   General:  Elderly chronically ill-appearing male in no acute distress  HEENT:  Unremarkable   Neck:   no JVD, no bruits, no adenopathy   Chest:   clear to auscultation, slightly diminished breath sounds on left side, no wheezes, no rhonchi, small bore pigtail chest tube in place and appropriately hooked up to [place] suction device, positive air leak, suction not currently on completely  CV:   RRR, no murmur   Abdomen:  soft, non-tender, no masses   Extremities:  warm, well-perfused, pulses diminished, no lower extremity edema  Rectal/GU  Deferred  Neuro:   Opens eyes and moves purposely to noxious  stimuli, not currently following commands, moves  all 4 extremities  Skin:   Clean and dry, no rashes, no breakdown  Diagnostic Tests:  Lab Results: Recent Labs    08/11/19 2304  WBC 6.7  HGB 14.7  HCT 46.4  PLT 166   BMET:  Recent Labs    08/11/19 2304  NA 136  K 4.2  CL 98  CO2 23  GLUCOSE 157*  BUN 34*  CREATININE 0.87  CALCIUM 9.1    CBG (last 3)  No results for input(s): GLUCAP in the last 72 hours. PT/INR:  No results for input(s): LABPROT, INR in the last 72 hours.  CXR:  CHEST  1 VIEW  COMPARISON:  01/07/2019  FINDINGS: Single frontal view of the chest demonstrates a large left-sided pneumothorax volume estimated greater than 50%. No tension effect or midline shift. Increased vascularity within the right lung consistent with vascular redistribution. No pleural effusion.  IMPRESSION: 1. Large greater than 50% left-sided pneumothorax without tension effect. 2. Vascular congestion within the aerated right lung, likely representing vascular redistribution.  These results were called by telephone at the time of interpretation on 08/12/2019 at 12:00 am to provider DAVID [facility] , who verbally acknowledged these results.   Electronically Signed   By: Sharlet Salina M.D.   On: 08/12/2019 00:00   PORTABLE CHEST 1 VIEW  COMPARISON:  08/11/2019  FINDINGS: There is no left-sided chest tube. The left lung has re-expanded but there is a small lateral residual pneumothorax. Cardiomediastinal contours and the right lung are unchanged.  IMPRESSION: Partial re-expansion of the left lung with small lateral residual pneumothorax.   Electronically Signed   By: Deatra Robinson M.D.   On: 08/12/2019 01:23    PORTABLE CHEST 1 VIEW  COMPARISON:  4:30 a.m. today  FINDINGS: Decreased left pneumothorax, now small to moderate volume. The chest tube has a more open retention loop but is in stable position. Cardiomegaly and interstitial coarsening.  IMPRESSION: Decreasing left  pneumothorax.   Electronically Signed   By: Marnee Spring M.D.   On: 08/12/2019 06:59   Impression:  Patient has left primary spontaneous pneumothorax with significant residual air leak and likely underlying COPD.  He has a small bore chest tube in what appears to be appropriate position but sizeable residual pneumothorax on serial x-rays.  Although the chest tube was hooked up correctly the wall suction was not on enough to provide 20 cm H2O suction - this has been corrected and suction increased to 40 cm H2O suction.  Patient is currently breathing comfortably in no distress.  Repeat CXR pending.  Although complete medical history is not available and no family members have been reached since the patient presented to the ED, based upon the patient's current appearance I would not consider him a candidate for any type of surgical intervention for management of pneumothorax with persistent air leak.  I would recommend palliative care consult.  If the patient's condition were to improve dramatically and/or he could participate in decision making then placement of a large bore chest tube and/or endobronchial valves could be considered.  I would not consider VATS nor any more aggressive measures under any circumstances.  Will follow.   I spent in excess of 60 minutes during the conduct of this hospital consultation and >50% of this time involved direct face-to-face encounter for counseling and/or coordination of the patient's care.    Salvatore Decent. Cornelius Moras, MD 08/12/2019 6:04 AM

## 2019-08-12 NOTE — ED Notes (Signed)
Left Side Chest Tube placed by EDP Dr. Preston Fleeting. Pt given 4 mg morphine for pain. Diminished breath sounds noted on left lung. RR 19. SpO2 100% on 3 L O2.   Neuro: Pt alert. Eye opening to speech. Pt states only name.

## 2019-08-13 ENCOUNTER — Inpatient Hospital Stay (HOSPITAL_COMMUNITY): Payer: Medicare Other

## 2019-08-13 DIAGNOSIS — E119 Type 2 diabetes mellitus without complications: Secondary | ICD-10-CM

## 2019-08-13 DIAGNOSIS — I480 Paroxysmal atrial fibrillation: Secondary | ICD-10-CM

## 2019-08-13 DIAGNOSIS — R627 Adult failure to thrive: Secondary | ICD-10-CM | POA: Diagnosis present

## 2019-08-13 DIAGNOSIS — Z87898 Personal history of other specified conditions: Secondary | ICD-10-CM

## 2019-08-13 DIAGNOSIS — Z515 Encounter for palliative care: Secondary | ICD-10-CM

## 2019-08-13 DIAGNOSIS — I10 Essential (primary) hypertension: Secondary | ICD-10-CM

## 2019-08-13 DIAGNOSIS — F0391 Unspecified dementia with behavioral disturbance: Secondary | ICD-10-CM

## 2019-08-13 DIAGNOSIS — G9341 Metabolic encephalopathy: Secondary | ICD-10-CM

## 2019-08-13 LAB — GLUCOSE, CAPILLARY
Glucose-Capillary: 130 mg/dL — ABNORMAL HIGH (ref 70–99)
Glucose-Capillary: 145 mg/dL — ABNORMAL HIGH (ref 70–99)
Glucose-Capillary: 118 mg/dL — ABNORMAL HIGH (ref 70–99)

## 2019-08-13 LAB — COMPREHENSIVE METABOLIC PANEL
ALT: 16 U/L (ref 0–44)
AST: 28 U/L (ref 15–41)
Albumin: 2.9 g/dL — ABNORMAL LOW (ref 3.5–5.0)
Alkaline Phosphatase: 65 U/L (ref 38–126)
Anion gap: 8 (ref 5–15)
BUN: 32 mg/dL — ABNORMAL HIGH (ref 8–23)
CO2: 28 mmol/L (ref 22–32)
Calcium: 9 mg/dL (ref 8.9–10.3)
Chloride: 107 mmol/L (ref 98–111)
Creatinine, Ser: 0.85 mg/dL (ref 0.61–1.24)
GFR calc Af Amer: >60 mL/min (ref >60)
GFR calc non Af Amer: >60 mL/min (ref >60)
Glucose, Bld: 141 mg/dL — ABNORMAL HIGH (ref 70–99)
Potassium: 4.5 mmol/L (ref 3.5–5.1)
Sodium: 143 mmol/L (ref 135–145)
Total Bilirubin: 0.6 mg/dL (ref 0.3–1.2)
Total Protein: 7.4 g/dL (ref 6.5–8.1)

## 2019-08-13 LAB — CBC WITH DIFFERENTIAL/PLATELET
Abs Immature Granulocytes: 0.01 10*3/uL (ref 0.00–0.07)
Basophils Absolute: 0 10*3/uL (ref 0.0–0.1)
Basophils Relative: 0 %
Eosinophils Absolute: 0.3 10*3/uL (ref 0.0–0.5)
Eosinophils Relative: 6 %
HCT: 44.1 % (ref 39.0–52.0)
Hemoglobin: 13.7 g/dL (ref 13.0–17.0)
Immature Granulocytes: 0 %
Lymphocytes Relative: 30 %
Lymphs Abs: 1.6 10*3/uL (ref 0.7–4.0)
MCH: 31 pg (ref 26.0–34.0)
MCHC: 31.1 g/dL (ref 30.0–36.0)
MCV: 99.8 fL (ref 80.0–100.0)
Monocytes Absolute: 0.6 10*3/uL (ref 0.1–1.0)
Monocytes Relative: 11 %
Neutro Abs: 2.8 10*3/uL (ref 1.7–7.7)
Neutrophils Relative %: 53 %
Platelets: 154 10*3/uL (ref 150–400)
RBC: 4.42 MIL/uL (ref 4.22–5.81)
RDW: 15.9 % — ABNORMAL HIGH (ref 11.5–15.5)
WBC: 5.4 10*3/uL (ref 4.0–10.5)
nRBC: 0 % (ref 0.0–0.2)

## 2019-08-13 LAB — MRSA PCR SCREENING: MRSA by PCR: NEGATIVE

## 2019-08-13 LAB — PHOSPHORUS: Phosphorus: 3.7 mg/dL (ref 2.5–4.6)

## 2019-08-13 LAB — MAGNESIUM: Magnesium: 2.4 mg/dL (ref 1.7–2.4)

## 2019-08-13 MED ORDER — JUVEN PO PACK: 1.0000 | PACK | Freq: Two times a day (BID) | ORAL | Status: DC

## 2019-08-13 MED ORDER — ACETAMINOPHEN 325 MG PO TABS: 650.0000 mg | ORAL_TABLET | Freq: Four times a day (QID) | ORAL | Status: DC | PRN

## 2019-08-13 MED ORDER — FREE WATER: 100.0000 mL | Status: DC

## 2019-08-13 MED ORDER — MORPHINE SULFATE (CONCENTRATE) 10 MG/0.5ML PO SOLN: 5.0000 mg | ORAL | Status: DC | PRN

## 2019-08-13 MED ORDER — POLYVINYL ALCOHOL 1.4 % OP SOLN
1.0000 [drp] | Freq: Four times a day (QID) | OPHTHALMIC | Status: DC | PRN
  Filled 2019-08-13: qty 15

## 2019-08-13 MED ORDER — OSMOLITE 1.5 CAL PO LIQD
1000.0000 mL | ORAL | Status: DC
  Filled 2019-08-13: qty 1000

## 2019-08-13 MED ORDER — LORAZEPAM 2 MG/ML IJ SOLN: 1.0000 mg | INTRAMUSCULAR | Status: DC | PRN

## 2019-08-13 MED ORDER — MORPHINE SULFATE (PF) 2 MG/ML IV SOLN: 1.0000 mg | INTRAVENOUS | Status: DC | PRN

## 2019-08-13 MED ORDER — BIOTENE DRY MOUTH MT LIQD: 15.0000 mL | OROMUCOSAL | Status: DC | PRN

## 2019-08-13 MED ORDER — GLYCOPYRROLATE 0.2 MG/ML IJ SOLN: 0.2000 mg | INTRAMUSCULAR | Status: DC | PRN

## 2019-08-13 MED ORDER — MORPHINE SULFATE (CONCENTRATE) 10 MG/0.5ML PO SOLN
5.0000 mg | ORAL | Status: DC
  Administered 2019-08-13 – 2019-08-14 (×4): 5 mg via SUBLINGUAL
  Filled 2019-08-13 (×4): qty 0.5

## 2019-08-13 MED ORDER — ACETAMINOPHEN 650 MG RE SUPP
650.0000 mg | Freq: Four times a day (QID) | RECTAL | Status: DC | PRN
  Filled 2019-08-13: qty 1

## 2019-08-13 NOTE — Progress Notes (Addendum)
Initial Nutrition Assessment  RD working remotely.  DOCUMENTATION CODES:   Not applicable  INTERVENTION:   Transition to continuous tube feeding via PEG: - Osmolite 1.5 @ 55 ml/hr (1320 ml/day) - Pro-stat 30 ml TID - Free water flushes of 100 ml q 4 hours  Tube feeding regimen provides 2280 kcal, 128 grams of protein, and 1606 ml of H2O (meets 100% of needs).  - 1 packet Juven BID per tube, each packet provides 95 calories, 2.5 grams of protein, and 9.8 grams of carbohydrate; also contains L-arginine and L-glutamine, vitamin C, vitamin E, vitamin B-12, zinc, calcium, and calcium Beta-hydroxy-Beta-methylbutyrate to support wound healing   NUTRITION DIAGNOSIS:   Increased nutrient needs related to wound healing as evidenced by estimated needs.  GOAL:   Patient will meet greater than or equal to 90% of their needs  MONITOR:   Labs, Weight trends, TF tolerance, Skin, I & O's, Other (GOC)  REASON FOR ASSESSMENT:   Consult Assessment of nutrition requirement/status, Enteral/tube feeding initiation and management  ASSESSMENT:   79 year old male who presented to the ED with SOB and was found to have large left spontaneous pneumothorax. Chest tube placed by ED physician. PMH of Alzheimer's disease, HTN, seizures, PEG tube.   Per TCTS, pt is not a good candidate for surgery. Palliative Care has been consulted and family meeting scheduled for today.  Consult received for tube feeding initiation and management. Given pt is NPO, will transition pt from nocturnal feeds to continuous feeds. Discussed with RN.  Current TF orders: Osmolite 1.5 @ 70 ml/hr to run for 12 hours from 1800 to 0600, Pro-stat 30 ml TID  Medications reviewed and include: miralax, senna  Labs reviewed. CBG's: 118-168 x 24 hours  NUTRITION - FOCUSED PHYSICAL EXAM:  Unable to complete at this time. RD working remotely.  Diet Order:   Diet Order    None      EDUCATION NEEDS:   No education needs have  been identified at this time  Skin:  Skin Assessment: Skin Integrity Issues: Stage IV: sacrum  Last BM:  08/13/19  Height:   Ht Readings from Last 1 Encounters:  08/12/19 6' (1.829 m)    Weight:   Wt Readings from Last 1 Encounters:  08/12/19 88.9 kg    Ideal Body Weight:  80.9 kg  BMI:  Body mass index is 26.58 kg/m.  Estimated Nutritional Needs:   Kcal:  2100-2300  Protein:  120-135 grams  Fluid:  >/= 2.0 L    Earma Reading, MS, RD, LDN Inpatient Clinical Dietitian Pager: [phone] Weekend/After Hours: [phone]

## 2019-08-13 NOTE — Progress Notes (Signed)
Nutrition Brief Note  Chart reviewed. Pt now transitioning to comfort care.  No further nutrition interventions warranted at this time.  Please re-consult as needed.    Kate Jablonski Kia Stavros, MS, RD, LDN Inpatient Clinical Dietitian Pager: [phone] Weekend/After Hours: [phone]  

## 2019-08-13 NOTE — TOC Initial Note (Addendum)
Transition of Care [facility]) - Initial/Assessment Note    Patient Details  Name: Joshua Gonzales MRN: [id number] Date of Birth: 1940/09/03  [facility] CM/SW Contact:    Jacquelynn Cree Phone Number: 08/13/2019, 4:10 PM  Clinical Narrative:                 CSW received consult for residential hospice. CSW contacted patient's niece Abigail Butts to discuss request. Niece expressed she is interested in [place] and agreeable to [employer]. Permission was provided to contacted [facility] for referral.  CSW contacted [facility] and spoke with Anderson Malta to provide referral. There is currently a wait list, CSW will be kept updated.  CSW will continue to follow and assist with discharge planning needs.  Expected Discharge Plan: Hospice Medical Facility Barriers to Discharge: Hospice Bed not available   Patient Goals and CMS Choice   CMS Medicare.gov Compare Post Acute Care list provided to:: Patient Represenative (must comment)(Wendy Sharlett Iles) Choice offered to / list presented to : Patient, Adult Children  Expected Discharge Plan and Services Expected Discharge Plan: [place]     Post Acute Care Choice: Hospice Living arrangements for the past 2 months: Apartment                                      Prior Living Arrangements/Services Living arrangements for the past 2 months: Apartment   Patient language and need for interpreter reviewed:: Yes        Need for Family Participation in Patient Care: Yes (Comment) Care giver support system in place?: Yes (comment)   Criminal Activity/Legal Involvement Pertinent to Current Situation/Hospitalization: No - Comment as needed  Activities of Daily Living      Permission Sought/Granted Permission sought to share information with : Facility Sport and exercise psychologist, Family Supports Permission granted to share information with : Yes, Verbal Permission Granted  Share Information with NAME: Dahlia Byes  Permission granted to share info w AGENCY: Hospice  Permission granted to share info w Relationship: Niece  Permission granted to share info w Contact Information: [phone]  Emotional Assessment   Attitude/Demeanor/Rapport: Unable to Assess Affect (typically observed): Unable to Assess Orientation: : Oriented to Self Alcohol / Substance Use: Not Applicable Psych Involvement: No (comment)  Admission diagnosis:  SOB (shortness of breath) [R06.02] Respiration abnormal [R06.9] Elevated BUN [R79.9] DNR (do not resuscitate) [Z66] Pneumothorax on left [J93.9] Pneumothorax [J93.9] Patient Active Problem List   Diagnosis Date Noted  . Failure to thrive in adult   . Comfort measures only status   . Pneumothorax 08/12/2019  . Acute respiratory failure with hypoxia ([place]) 08/12/2019  . DNR (do not resuscitate)   . Pneumothorax on left   . Advanced care planning/counseling discussion   . Fever of unknown origin   . Sacral wound 01/07/2019  . HTN (hypertension) 01/07/2019  . Alzheimer's dementia ([place]) 01/07/2019  . Osteomyelitis of vertebra, sacral and sacrococcygeal region ([place])   . Pressure injury of skin 11/24/2018  . Acute metabolic encephalopathy 10/93/2355  . Cellulitis 11/23/2018  . Osteomyelitis ([place]) 11/23/2018  . Constipation 11/23/2018  . Goals of care, counseling/discussion   . Palliative care by specialist   . FTT (failure to thrive) in adult   . Type 2 diabetes mellitus without complication ([place]) 73/22/0254  . History of CVA with residual deficit 08/16/2018  . Hyperammonemia ([place]) 08/16/2018  . Bradycardia 08/16/2018  . Lactic  acid increased   . Hypotension 06/03/2014  . Near syncope 06/03/2014  . AKI (acute kidney injury) (HCC) 06/03/2014  . Partial anterior cerebral circulation infarction (HCC)   . Dementia with behavioral disturbance (HCC)   . PAF (paroxysmal atrial fibrillation) (HCC)   . History of seizures 05/15/2014  . Essential hypertension,  benign 01/24/2013  . Unspecified late effects of cerebrovascular disease 01/24/2013  . Acute encephalopathy 11/26/2012  . Hypokalemia 11/26/2012  . Alcoholism in remission (HCC) 11/26/2012   PCP:  [name], MD Pharmacy:   [facility] [phone] [place], [place] - [phone] AT [facility] [address] & [address] [place] [address] Phone: [phone] Fax: [phone]     Social Determinants of Health (SDOH) Interventions    Readmission Risk Interventions No flowsheet data found.

## 2019-08-13 NOTE — Progress Notes (Addendum)
      [address]       [place] [address]             [phone]     Subjective:  Patient wouldn't speak.  He is dosing in and out of sleep.  Objective: Vital signs in last 24 hours: Temp:  [97.4 F (36.3 C)-98.5 F (36.9 C)] 98.2 F (36.8 C) (06/09 0336) Pulse Rate:  [25-96] 50 (06/08 2329) Cardiac Rhythm: Normal sinus rhythm (06/08 1900) Resp:  [17-34] 21 (06/08 2329) BP: (102-155)/(69-91) 123/77 (06/08 2345) SpO2:  [88 %-100 %] 99 % (06/09 0336) Weight:  [88.9 kg] 88.9 kg (06/08 1806)  Intake/Output from previous day: 06/08 0701 - 06/09 0700 In: 1007 [NG/GT:707] Out: -   General appearance: cooperative Heart: regular rate and rhythm Lungs: diminshed on left Wound: CT not kinked, clean and dry  Lab Results: Recent Labs    08/12/19 0832 08/13/19 0220  WBC 8.1 5.4  HGB 14.4 13.7  HCT 46.4 44.1  PLT 167 154   BMET:  Recent Labs    08/12/19 0832 08/13/19 0220  NA 142 143  K 4.9 4.5  CL 105 107  CO2 28 28  GLUCOSE 127* 141*  BUN 30* 32*  CREATININE 1.01 0.85  CALCIUM 9.2 9.0    PT/INR: No results for input(s): LABPROT, INR in the last 72 hours. ABG    Component Value Date/Time   HCO3 28.6 (H) 08/16/2018 1117   TCO2 29 08/16/2018 1117   TCO2 30 08/16/2018 1117   O2SAT 99.0 08/16/2018 1117   CBG (last 3)  Recent Labs    08/12/19 2027 08/12/19 2350 08/13/19 0336  GLUCAP 166* 168* 130*    Assessment/Plan:  1. Spontaneous pneumothorax- increased in size, pigtail in place currently on suction -- patient may need a larger chest tube, however would need to clarify with family if patient wishes to proceed with new chest tube placement 2. Dispo- patient stable, increased pneumothorax on CXR, leave pigtail to suction, may require larger chest tube however defer to surgeon and family would need to decide if they want patient to have this done, care per primary   LOS: 1 day    Lowella Dandy, PA-C  08/13/2019   I have seen and  examined the patient and agree with the assessment and plan as outlined.  CXR this morning reveals increased size of residual PTX without apparent change in position of tube.  Tube currently on 40 cm H2O suction and no air leak seen.  Patient breathing comfortably w/ no signs of dyspnea.  Placement of conventional chest tube might facilitate more complete reexpansion of lung but also be associated with increased discomfort.  In my opinion this patient is not a candidate for any other kind of intervention and should be treated with primary goal of minimizing suffering.  I favor Hospice care with no other interventions.  Please call if placement of large bore chest tube is desired.   Purcell Nails, MD 08/13/2019 7:43 AM

## 2019-08-13 NOTE — Progress Notes (Signed)
Civil engineer, contracting [facility])  Referral received for residential hospice at [facility].  Spoke with Carrington Clamp, provided support and answered questions.  She is aware we do not have any beds at [facility] currently.  Will update TOC and family once bed becomes available.  Wallis Bamberg RN, BSN, CCRN [facility] Liaison

## 2019-08-13 NOTE — Progress Notes (Addendum)
Daily Progress Note   Patient Name: Joshua Gonzales       Date: 08/13/2019 DOB: 1940/05/29  Age: 79 y.o. MRN#: [id number] Attending Physician: Eugenie Filler, MD Primary Care Physician: Seward Carol, MD Admit Date: 08/11/2019  Reason for Consultation/Follow-up: Establishing goals of care  HPI/Patient Profile: 79 y.o. male chronically ill male with history of advanced dementia, seizure disorder, DM2 presents to [facility] Emergency Department 08/11/2019 from the skilled nursing facility where he resides with reported increased shortness of breath overnight. Chest x-ray revealed left pneumothorax. Left chest tube was inserted by ED staff with initial chest x-ray showing near complete resolution in pneumothorax. Patient later had episodes of hypoxia and increased O2 requirements, with repeat chest x-ray showing partial lung collapse. Chest tube was adjusted by Pulmonary critical care.  Patient is noted to have a multiple co-morbid conditions, PEG tube, sacral wound, and generalized failure to thrive. He is a long-term resident of [facility]. Palliative care was consulted to assist with goals of care.   6/9--Per CT surgery, pneumothorax has increased in size. They recommend Hospice care without additional medical interventions.   Subjective: Patient lying in bed, eyes open, he follows commands. Able to answer yes or no questions. When I told him he was in the hospital with a collapsed lung, he asked "why".   13:00--GOC discussion with the family via conference call. Present on this call are: Dahlia Byes (niece), Lorenso Courier (niece), Sharlene Dory (niece), Lorri Frederick (great niece), Kermit Balo (sister), and Myrene Galas (sister). Discussed patient's acute issue of pneumothorax and that  it has worsened despite treatment; informed family that CT surgery does not recommend additional intervention.  Also discussed patient's multiple chronic medical issues and debilitated functional status as a long-term resident at [facility]. Patient's family agrees that he has poor quality of life.  - I provided education that patient is showing signs that he is in the process of dying. - The difference between aggressive medical intervention and comfort care was discussed.  - Concepts specific to artifical feeding and hydration, and rehospitalization were considered and discussed. - Hospice services were explained and offered (at SNF versus residential hospice).  Discussed transitioning to comfort care while in the hospital, and what that would look like--keeping him clean and dry, no labs, no artificial hydration or feeding, no  antibiotics, minimizing of medications, comfort feeds, medication for pain and dyspnea. Family agrees with this plan of care.  Family does not want patient to return to SNF for care. They would prefer placement in residential hospice.   Questions and concerns were addressed.  The family was encouraged to call with questions or concerns.    Length of Stay: 1  Current Medications: Scheduled Meds:  . levETIRAcetam  750 mg Per Tube BID  . polyethylene glycol  17 g Per Tube BID  . senna  1 tablet Per Tube BID  . valproic acid  125 mg Per Tube BID    Continuous Infusions:   PRN Meds: acetaminophen **OR** acetaminophen, antiseptic oral rinse, glycopyrrolate, ipratropium-albuterol, LORazepam, morphine injection, morphine CONCENTRATE, ondansetron **OR** ondansetron (ZOFRAN) IV, polyvinyl alcohol  Physical Exam Constitutional:      General: He is not in acute distress.    Comments: Chronically ill-appearing  HENT:     Head: Normocephalic and atraumatic.  Cardiovascular:     Rate and Rhythm: Normal rate. Rhythm irregular.  Pulmonary:     Effort: Pulmonary effort is  normal.     Comments: Left Chest tube O2 at 4L Neurological:     Mental Status: He is alert.             Vital Signs: BP (!) 139/92 (BP Location: Left Arm)   Pulse 81   Temp 98.3 F (36.8 C) (Axillary)   Resp (!) 30   Ht 6' (1.829 m)   Wt 88.9 kg   SpO2 96%   BMI 26.58 kg/m  SpO2: SpO2: 96 % O2 Device: O2 Device: Nasal Cannula O2 Flow Rate: O2 Flow Rate (L/min): 4 L/min  Intake/output summary:   Intake/Output Summary (Last 24 hours) at 08/13/2019 1530 Last data filed at 08/13/2019 1100 Gross per 24 hour  Intake 989.33 ml  Output 100 ml  Net 889.33 ml   LBM: Last BM Date: 08/13/19 Baseline Weight: Weight: 88.9 kg Most recent weight: Weight: 88.9 kg       Palliative Assessment/Data: 10-20%      Patient Active Problem List   Diagnosis Date Noted  . Pneumothorax 08/12/2019  . Acute respiratory failure with hypoxia (HCC) 08/12/2019  . DNR (do not resuscitate)   . Pneumothorax on left   . Advanced care planning/counseling discussion   . Fever of unknown origin   . Sacral wound 01/07/2019  . HTN (hypertension) 01/07/2019  . Alzheimer's dementia (HCC) 01/07/2019  . Osteomyelitis of vertebra, sacral and sacrococcygeal region (HCC)   . Pressure injury of skin 11/24/2018  . Acute metabolic encephalopathy 11/23/2018  . Cellulitis 11/23/2018  . Osteomyelitis (HCC) 11/23/2018  . Constipation 11/23/2018  . Goals of care, counseling/discussion   . Palliative care by specialist   . FTT (failure to thrive) in adult   . Type 2 diabetes mellitus without complication (HCC) 08/16/2018  . History of CVA with residual deficit 08/16/2018  . Hyperammonemia (HCC) 08/16/2018  . Bradycardia 08/16/2018  . Lactic acid increased   . Hypotension 06/03/2014  . Near syncope 06/03/2014  . AKI (acute kidney injury) (HCC) 06/03/2014  . Partial anterior cerebral circulation infarction (HCC)   . Dementia with behavioral disturbance (HCC)   . PAF (paroxysmal atrial fibrillation) (HCC)   .  History of seizures 05/15/2014  . Essential hypertension, benign 01/24/2013  . Unspecified late effects of cerebrovascular disease 01/24/2013  . Acute encephalopathy 11/26/2012  . Hypokalemia 11/26/2012  . 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 Family agrees that goal of care should focus on comfort.    Recommendations/Plan: - transition to comfort care  - TOC order placed for referral to residential hospice - recommend continuing seizure medications until discharge - Morphine CONCENTRATE 10 mg/0.75ml oral solution 5 mg SL every 4 hours - Morphine 1 mg IV every 2 hours prn for breakthrough pain  Symptom Management:   Per end of life order set  Morphine prn for pain or shortness of breath  Lorazepam (ATIVAN) prn for anxiety  Glycopyrrolate (ROBINUL) for excessive secretions  Ondansetron (ZOFRAN) prn for nausea  Goals of Care and Additional Recommendations:  Limitations on Scope of Treatment: Full Comfort Care  Code Status:  DNR  Prognosis:   < 2 weeks  Discharge Planning:  Hospice facility  Care plan was discussed with Dr. Janee Morn, CSW, bedside RN  Thank you for allowing the Palliative Medicine Team to assist in the care of this patient.   Total Time 35 minutes Prolonged Time Billed  no       Greater than 50%  of this time was spent counseling and coordinating care related to the above assessment and plan.  Merry Proud, NP  Please contact Palliative Medicine Team phone at [phone] for questions and concerns.

## 2019-08-13 NOTE — Plan of Care (Signed)
  Problem: Clinical Measurements: Goal: Ability to maintain clinical measurements within normal limits will improve Outcome: Progressing   

## 2019-08-13 NOTE — Plan of Care (Signed)
  Problem: Education: Goal: Knowledge of General Education information will improve Description: Including pain rating scale, medication(s)/side effects and non-pharmacologic comfort measures 08/13/2019 1029 by Alesia Morin, RN Outcome: Not Progressing 08/13/2019 1029 by Alesia Morin, RN Outcome: Progressing   Problem: Health Behavior/Discharge Planning: Goal: Ability to manage health-related needs will improve 08/13/2019 1029 by Alesia Morin, RN Outcome: Not Progressing 08/13/2019 1029 by Alesia Morin, RN Outcome: Progressing   Problem: Clinical Measurements: Goal: Ability to maintain clinical measurements within normal limits will improve 08/13/2019 1029 by Alesia Morin, RN Outcome: Not Progressing 08/13/2019 1029 by Alesia Morin, RN Outcome: Progressing Goal: Will remain free from infection 08/13/2019 1029 by Alesia Morin, RN Outcome: Not Progressing 08/13/2019 1029 by Alesia Morin, RN Outcome: Progressing Goal: Diagnostic test results will improve 08/13/2019 1029 by Alesia Morin, RN Outcome: Not Progressing 08/13/2019 1029 by Alesia Morin, RN Outcome: Progressing Goal: Respiratory complications will improve 08/13/2019 1029 by Alesia Morin, RN Outcome: Not Progressing 08/13/2019 1029 by Alesia Morin, RN Outcome: Progressing Goal: Cardiovascular complication will be avoided 08/13/2019 1029 by Alesia Morin, RN Outcome: Not Progressing 08/13/2019 1029 by Alesia Morin, RN Outcome: Progressing   Problem: Activity: Goal: Risk for activity intolerance will decrease 08/13/2019 1029 by Alesia Morin, RN Outcome: Not Progressing 08/13/2019 1029 by Alesia Morin, RN Outcome: Progressing   Problem: Nutrition: Goal: Adequate nutrition will be maintained 08/13/2019 1029 by Alesia Morin, RN Outcome: Not Progressing 08/13/2019 1029 by Alesia Morin, RN Outcome: Progressing   Problem: Coping: Goal: Level  of anxiety will decrease 08/13/2019 1029 by Alesia Morin, RN Outcome: Not Progressing 08/13/2019 1029 by Alesia Morin, RN Outcome: Progressing   Problem: Elimination: Goal: Will not experience complications related to bowel motility 08/13/2019 1029 by Alesia Morin, RN Outcome: Not Progressing 08/13/2019 1029 by Alesia Morin, RN Outcome: Progressing Goal: Will not experience complications related to urinary retention 08/13/2019 1029 by Alesia Morin, RN Outcome: Not Progressing 08/13/2019 1029 by Alesia Morin, RN Outcome: Progressing   Problem: Pain Managment: Goal: General experience of comfort will improve 08/13/2019 1029 by Alesia Morin, RN Outcome: Not Progressing 08/13/2019 1029 by Alesia Morin, RN Outcome: Progressing   Problem: Safety: Goal: Ability to remain free from injury will improve 08/13/2019 1029 by Alesia Morin, RN Outcome: Not Progressing 08/13/2019 1029 by Alesia Morin, RN Outcome: Progressing   Problem: Skin Integrity: Goal: Risk for impaired skin integrity will decrease 08/13/2019 1029 by Alesia Morin, RN Outcome: Not Progressing 08/13/2019 1029 by Alesia Morin, RN Outcome: Progressing  Pt has advanced dementia, no family available

## 2019-08-13 NOTE — Consult Note (Signed)
WOC Nurse Consult Note: Patient receiving care in [facility] 2C7. Reason for Consult: "decubitus ulcers" Wound type: healing stage 4 sacral/coccyx; healing very well. Pressure Injury POA: Yes Measurement: 3.1 cm x 0.8 cm x 1.0 Wound bed: 100% dark pink Drainage (amount, consistency, odor) none Periwound: hypopigmented, scarred, but healed Dressing procedure/placement/frequency: Place a small sliver of Aquacel into the sacral wound bed. Cover with a foam dressing.  Change every 2 days and prn. Monitor the wound area(s) for worsening of condition such as: Signs/symptoms of infection,  Increase in size,  Development of or worsening of odor, Development of pain, or increased pain at the affected locations.  Notify the medical team if any of these develop.  Thank you for the consult.  Discussed plan of care with the bedside nurse.  WOC nurse will not follow at this time.  Please re-consult the WOC team if needed.  Helmut Muster, RN, MSN, CWOCN, CNS-BC, pager [phone]

## 2019-08-13 NOTE — Progress Notes (Signed)
PROGRESS NOTE    [name]  DUK:[id number] DOB: 1940/12/14 DOA: 08/11/2019 PCP: Seward Carol, MD    Chief Complaint  Patient presents with  . Shortness of Breath  . Chest Injury    Brief Narrative:  Goerge Mohr Phillipsis a 79 y.o.malewithhistory of dementia, sacral decubitus bedbound seizure disorder was brought to the ER patient was found to be short of breath over the last 24 hours. X-rays done at the facility was found to have a pneumothorax.  ED Course:In the ER patient was found to be short of breath and chest x-ray confirms a pneumothorax and had a chest tube placed by ER physician following which the pneumothorax improved. Dr. Roxy Manns, cardiothoracic surgery was consulted. Shortly after that patient became more short of breath repeat chest x-ray shows worsening pneumothorax for which I did discuss with ER physician Dr. Roxanne Mins and after manipulating the chest tube by Dr. Roxanne Mins chest x-ray repeated again still shows pneumothorax but patient is presently hemodynamically stable.     Assessment & Plan:   Principal Problem:   Acute respiratory failure with hypoxia (HCC) Active Problems:   Essential hypertension, benign   History of seizures   Dementia with behavioral disturbance (HCC)   PAF (paroxysmal atrial fibrillation) (HCC)   Type 2 diabetes mellitus without complication (HCC)   Pneumothorax  1 spontaneous pneumothorax in patient with known history of COPD Status post chest tube placement.  Patient seen in consultation by PCCM and CT surgery.  Per CT surgery patient not a candidate for surgical intervention(as patient dramatically improved the may consider large bore chest tube and/or endobronchial valves) and recommending palliative care consultation.  Chest x-ray done 08/12/2019 with stable left pneumothorax.  Chest x-ray done this morning with increasing size of large left pneumothorax currently occupying 50% of the volume of the left hemithorax.  Left sided chest  tube similarly positioned.  Patchy multifocal interstitial airspace disease again noted throughout lungs bilaterally similar to prior study.  PCCM following for chest tube management.  Palliative care to meet with family today for goals of care discussion.  Follow for now.  2.  Acute metabolic encephalopathy Patient with a history of advanced dementia.  Per discharge notes from 01/2019 patient alert to name only at that time and patient likely close to baseline.  Follows some commands.  Supportive care.  3.  History of seizures Continue Depakote and Keppra.  4.  History of dementia on Aricept Follow.  5.  Hypertension BP currently stable.  Follow.  6.  Paroxysmal atrial fibrillation Currently rate controlled.  Not a candidate for anticoagulation.  Palliative care goals of care meeting with family pending for today.  Follow.  7.  FTT/Nutrition Continue PEG tube feeds.  Palliative care to meet with family for goals of care discussion.  8.  Diabetes mellitus type 2 Hemoglobin A1c 6.7.  CBG 130.  Follow.  9.  Chronic sacral and multiple wounds, POA Patient seen by wound care.  Continue current wound care regimen.  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 red, healing (Active)  11/23/18 1330  Location: Sacrum  Location Orientation: Medial  Staging: Stage 4 - Full thickness tissue loss with exposed bone, tendon or muscle.  Wound Description (Comments): red, healing  Present on Admission: Yes     Pressure Injury 11/23/18 Heel Left Unstageable - Full thickness tissue loss in which the base of the ulcer is covered by slough (yellow, tan, gray,  green or brown) and/or eschar (tan, brown or black) in the wound bed. (Active)  11/23/18 1330  Location: Heel  Location Orientation: Left  Staging: Unstageable - Full thickness tissue loss in which the base of the ulcer is covered by slough (yellow, tan, gray, green or brown)  and/or eschar (tan, brown or black) in the wound bed.  Wound Description (Comments):   Present on Admission: Yes     Pressure Injury 11/23/18 Heel Right Unstageable - Full thickness tissue loss in which the base of the ulcer is covered by slough (yellow, tan, gray, green or brown) and/or eschar (tan, brown or black) in the wound bed. (Active)  11/23/18 1330  Location: Heel  Location Orientation: Right  Staging: Unstageable - Full thickness tissue loss in which the base of the ulcer is covered by slough (yellow, tan, gray, green or brown) and/or eschar (tan, brown or black) in the wound bed.  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 greenish color to drainage, open wound, palpable sacrum( Physical Therapy) (Active)  11/25/18 1030  Location: Sacrum  Location Orientation: Medial  Staging: Stage IV - Full thickness tissue loss with exposed bone, tendon or muscle.  Wound Description (Comments): greenish color to drainage, open wound, palpable sacrum( Physical Therapy)  Present on Admission:         DVT prophylaxis: SCDs Code Status: DNR Family Communication: No family at bedside. Disposition:   Status is: Inpatient    Dispo: The patient is from: SNF              Anticipated d/c is to: Pending palliative care meeting, likely residential hospice home versus SNF with palliative care following.              Anticipated d/c date is: To be determined.              Patient currently with worsening pneumothorax despite chest tube placement.  Palliative care family meeting for goals of care pending.       Consultants:   PCCM: Dr. Tonia Brooms 08/12/2019  Cardiothoracic surgery: Dr. Cornelius Moras 08/12/2019  Wound care  Palliative care Illa Level, NP 08/12/2019  Procedures:   Chest x-ray 08/13/2019, 08/12/2019  Chest tube placement 08/11/2019  Antimicrobials:    None   Subjective: Alert.  Denies any significant shortness of breath.  Complaining of some chest pain around chest tube site.  No abdominal pain.  Objective: Vitals:   08/12/19 2329 08/12/19 2345 08/13/19 0336 08/13/19 0749  BP: 123/77 123/77  130/81  Pulse: (!) 50   72  Resp: (!) 21   (!) 21  Temp: 98.1 F (36.7 C) 98.2 F (36.8 C) 98.2 F (36.8 C) 98.2 F (36.8 C)  TempSrc: Axillary Axillary Axillary Axillary  SpO2:   99% 99%  Weight:      Height:        Intake/Output Summary (Last 24 hours) at 08/13/2019 1024 Last data filed at 08/13/2019 0819 Gross per 24 hour  Intake 1289.33 ml  Output --  Net 1289.33 ml   Filed Weights   08/12/19 1806  Weight: 88.9 kg    Examination:  General exam: Appears calm and comfortable  Respiratory system: Minimal to mild expiratory wheezing anterior lung fields.  Decreased breath sounds on the left.  Left chest tube in place.  Cardiovascular system: Regular rate rhythm no murmurs rubs or  gallops.  No JVD.  No lower extremity edema. Gastrointestinal system: Abdomen is nondistended, soft and nontender. No organomegaly or masses felt. Normal bowel sounds heard. Central nervous system: Alert to self only. No focal neurological deficits. Extremities: Symmetric 5 x 5 power. Skin: No rashes, lesions or ulcers Psychiatry: Judgement and insight appear poor. Mood & affect appropriate.     Data Reviewed: I have personally reviewed following labs and imaging studies  CBC: Recent Labs  Lab 08/11/19 2304 08/12/19 0832 08/13/19 0220  WBC 6.7 8.1 5.4  NEUTROABS 3.5 4.9 2.8  HGB 14.7 14.4 13.7  HCT 46.4 46.4 44.1  MCV 99.1 100.9* 99.8  PLT 166 167 154    Basic Metabolic Panel: Recent Labs  Lab 08/11/19 2304 08/12/19 0832 08/13/19 0220  NA 136 142 143  K 4.2 4.9 4.5  CL 98 105 107  CO2 23 28 28   GLUCOSE 157* 127* 141*  BUN 34* 30* 32*  CREATININE 0.87 1.01 0.85  CALCIUM 9.1 9.2 9.0  MG  --   --  2.4  PHOS  --   --  3.7     GFR: Estimated Creatinine Clearance: 81.2 mL/min (by C-G formula based on SCr of 0.85 mg/dL).  Liver Function Tests: Recent Labs  Lab 08/12/19 0832 08/13/19 0220  AST 24 28  ALT 15 16  ALKPHOS 68 65  BILITOT 0.6 0.6  PROT 7.9 7.4  ALBUMIN 3.3* 2.9*    CBG: Recent Labs  Lab 08/12/19 1556 08/12/19 2027 08/12/19 2350 08/13/19 0336 08/13/19 0808  GLUCAP 107* 166* 168* 130* 145*     Recent Results (from the past 240 hour(s))  SARS Coronavirus 2 by RT PCR (hospital order, performed in [facility] hospital lab) Nasopharyngeal Nasopharyngeal Swab     Status: None   Collection Time: 08/11/19 11:08 PM   Specimen: Nasopharyngeal Swab  Result Value Ref Range Status   SARS Coronavirus 2 NEGATIVE NEGATIVE Final    Comment: (NOTE) SARS-CoV-2 target nucleic acids are NOT DETECTED. The SARS-CoV-2 RNA is generally detectable in upper and lower respiratory specimens during the acute phase of infection. The lowest concentration of SARS-CoV-2 viral copies this assay can detect is 250 copies / mL. A negative result does not preclude SARS-CoV-2 infection and should not be used as the sole basis for treatment or other patient management decisions.  A negative result may occur with improper specimen collection / handling, submission of specimen other than nasopharyngeal swab, presence of viral mutation(s) within the areas targeted by this assay, and inadequate number of viral copies (<250 copies / mL). A negative result must be combined with clinical observations, patient history, and epidemiological information. Fact Sheet for Patients:   10/11/19 Fact Sheet for Healthcare Providers: BoilerBrush.com.cy This test is not yet approved or cleared  by the https://pope.com/ FDA and has been authorized for detection and/or diagnosis of SARS-CoV-2 by FDA under an Emergency Use Authorization (EUA).  This EUA will remain in effect (meaning  this test can be used) for the duration of the COVID-19 declaration under Section 564(b)(1) of the Act, 21 U.S.C. section 360bbb-3(b)(1), unless the authorization is terminated or revoked sooner. Performed at [facility], [address], [place], [place]          Radiology Studies: DG Chest 1 View  Result Date: 08/12/2019 CLINICAL DATA:  Short of breath, left pneumothorax, decreased left breath sounds EXAM: CHEST  1 VIEW COMPARISON:  01/07/2019 FINDINGS: Single frontal view of the chest demonstrates a  large left-sided pneumothorax volume estimated greater than 50%. No tension effect or midline shift. Increased vascularity within the right lung consistent with vascular redistribution. No pleural effusion. IMPRESSION: 1. Large greater than 50% left-sided pneumothorax without tension effect. 2. Vascular congestion within the aerated right lung, likely representing vascular redistribution. These results were called by telephone at the time of interpretation on 08/12/2019 at 12:00 am to provider [facility] , who verbally acknowledged these results. Electronically Signed   By: Sharlet Salina M.D.   On: 08/12/2019 00:00   DG Chest Port 1 View  Result Date: 08/13/2019 CLINICAL DATA:  79 year old male with history of abnormal respiration rate. EXAM: PORTABLE CHEST 1 VIEW COMPARISON:  Chest x-ray 08/12/2019. FINDINGS: Left-sided chest tube is in similar position with tip projecting over the mid left hemithorax. Significant enlargement of the left pneumothorax which now occupies approximately 50% of the volume of the left hemithorax. Patchy multifocal interstitial and airspace disease again noted throughout the lungs bilaterally, most confluent in the left base and right upper lobe. No right pneumothorax. No pleural effusions. No evidence of pulmonary edema. Heart size is normal. Upper mediastinal contours are within normal limits. Aortic atherosclerosis. IMPRESSION: 1. Interval increase in  size of large left pneumothorax, currently occupying approximately 50% of the volume of the left hemithorax. Left-sided chest tube is similarly positioned. 2. Patchy multifocal interstitial and airspace disease again noted throughout the lungs bilaterally, similar to the prior study. 3. Aortic atherosclerosis. Electronically Signed   By: Trudie Reed M.D.   On: 08/13/2019 09:11   DG CHEST PORT 1 VIEW  Result Date: 08/12/2019 CLINICAL DATA:  Follow-up left-sided chest tube EXAM: PORTABLE CHEST 1 VIEW COMPARISON:  Film from earlier in the same day. FINDINGS: Left chest tube is again identified and stable. Small left pneumothorax is seen stable in appearance from the prior exam. No new focal infiltrate or effusion is seen. Coarsened interstitial markings are again noted. Cardiac shadow is stable. IMPRESSION: Stable left pneumothorax with chest tube in place. Electronically Signed   By: Alcide Clever M.D.   On: 08/12/2019 12:27   DG Chest Portable 1 View  Result Date: 08/12/2019 CLINICAL DATA:  Chest tube EXAM: PORTABLE CHEST 1 VIEW COMPARISON:  4:30 a.m. today FINDINGS: Decreased left pneumothorax, now small to moderate volume. The chest tube has a more open retention loop but is in stable position. Cardiomegaly and interstitial coarsening. IMPRESSION: Decreasing left pneumothorax. Electronically Signed   By: Marnee Spring M.D.   On: 08/12/2019 06:59   DG Chest Portable 1 View  Result Date: 08/12/2019 CLINICAL DATA:  Chest tube EXAM: PORTABLE CHEST 1 VIEW COMPARISON:  Earlier today FINDINGS: Sizable left pneumothorax measuring up to 2.8 cm in thickness, unchanged from most recent prior. The chest tube is in stable position. Interstitial opacity on both sides. Cardiomegaly. IMPRESSION: Unchanged moderate to large left pneumothorax when compared to the most recent prior. Electronically Signed   By: Marnee Spring M.D.   On: 08/12/2019 04:59   DG CHEST PORT 1 VIEW  Result Date: 08/12/2019 CLINICAL DATA:   Hypertension, diabetes, stroke EXAM: PORTABLE CHEST 1 VIEW COMPARISON:  Radiograph 08/12/2019 FINDINGS: Left pigtail pleural drain remains in place in the mid chest. Interval reexpansion of a now moderate to large left pneumothorax both laterally and medially with a volume estimated at approximately 40-60%. Increasing attenuation within left lung likely reflecting some atelectatic changes. Albeit with more patchy opacity in the lung base. Mixed reticular and patchy opacities are also present in  the right lung, similar to the comparison exam with band of opacity in mid lung compatible with scarring. Cardiomediastinal contours are stable. The aorta is calcified. The remaining cardiomediastinal contours are unremarkable. No acute osseous or soft tissue abnormality. Degenerative changes are present in the imaged spine and shoulders. Telemetry leads overlie the chest. IMPRESSION: 1. Interval reexpansion of a now moderate to large left pneumothorax both laterally and medially with a volume estimated at approximately 40-60%. 2. Increasing attenuation within the left lung likely reflecting atelectatic changes. 3. Coarse opacities throughout the right lung are similar to prior. 4.  Aortic Atherosclerosis (ICD10-I70.0). Critical Value/emergent results were called by telephone at the time of interpretation on 08/12/2019 at 3:49 am to provider [facility] , who verbally acknowledged these results. Electronically Signed   By: Kreg ShropshirePrice  DeHay M.D.   On: 08/12/2019 03:49   DG Chest Portable 1 View  Result Date: 08/12/2019 CLINICAL DATA:  Chest tube placement EXAM: PORTABLE CHEST 1 VIEW COMPARISON:  08/11/2019 FINDINGS: There is no left-sided chest tube. The left lung has re-expanded but there is a small lateral residual pneumothorax. Cardiomediastinal contours and the right lung are unchanged. IMPRESSION: Partial re-expansion of the left lung with small lateral residual pneumothorax. Electronically Signed   By: Deatra RobinsonKevin  Herman  M.D.   On: 08/12/2019 01:23        Scheduled Meds: . amLODipine  5 mg Per Tube Daily  . donepezil  10 mg Per Tube QHS  . feeding supplement (PRO-STAT SUGAR FREE 64)  30 mL Per Tube TID  . levETIRAcetam  750 mg Per Tube BID  . metoprolol tartrate  12.5 mg Per Tube BID  . polyethylene glycol  17 g Per Tube BID  . senna  1 tablet Per Tube BID  . valproic acid  125 mg Per Tube BID   Continuous Infusions: . feeding supplement (OSMOLITE 1.5 CAL) Stopped (08/13/19 0819)     LOS: 1 day    Time spent: 35 minutes    Ramiro Harvestaniel Jordana Dugue, MD [facility]   To contact the attending provider between 7A-7P or the covering provider during after hours 7P-7A, please log into the web site [email or web address] and access using universal [place] password for that web site. If you do not have the password, please call the hospital operator.  08/13/2019, 10:24 AM

## 2019-08-14 DIAGNOSIS — Z515 Encounter for palliative care: Secondary | ICD-10-CM

## 2019-08-14 MED ORDER — POLYVINYL ALCOHOL 1.4 % OP SOLN: 1.0000 [drp] | Freq: Four times a day (QID) | OPHTHALMIC | 0 refills | Status: AC | PRN

## 2019-08-14 MED ORDER — MORPHINE SULFATE (CONCENTRATE) 10 MG/0.5ML PO SOLN: 5.0000 mg | ORAL | 0 refills | Status: AC

## 2019-08-14 NOTE — Plan of Care (Signed)
Problem: Education: Goal: Knowledge of General Education information will improve Description: Including pain rating scale, medication(s)/side effects and non-pharmacologic comfort measures 08/14/2019 1323 by Don Perking, RN Outcome: Not Met (add Reason) 08/14/2019 1001 by Don Perking, RN Outcome: Progressing   Problem: Health Behavior/Discharge Planning: Goal: Ability to manage health-related needs will improve 08/14/2019 1323 by Don Perking, RN Outcome: Not Met (add Reason) 08/14/2019 1001 by Don Perking, RN Outcome: Progressing   Problem: Clinical Measurements: Goal: Ability to maintain clinical measurements within normal limits will improve 08/14/2019 1323 by Don Perking, RN Outcome: Not Met (add Reason) 08/14/2019 1001 by Don Perking, RN Outcome: Progressing Goal: Will remain free from infection 08/14/2019 1323 by Don Perking, RN Outcome: Not Met (add Reason) 08/14/2019 1001 by Don Perking, RN Outcome: Progressing Goal: Diagnostic test results will improve 08/14/2019 1323 by Don Perking, RN Outcome: Not Met (add Reason) 08/14/2019 1001 by Don Perking, RN Outcome: Progressing Goal: Respiratory complications will improve 08/14/2019 1323 by Don Perking, RN Outcome: Not Met (add Reason) 08/14/2019 1001 by Don Perking, RN Outcome: Progressing Goal: Cardiovascular complication will be avoided 08/14/2019 1323 by Don Perking, RN Outcome: Not Met (add Reason) 08/14/2019 1001 by Don Perking, RN Outcome: Progressing   Problem: Activity: Goal: Risk for activity intolerance will decrease 08/14/2019 1323 by Don Perking, RN Outcome: Not Met (add Reason) 08/14/2019 1001 by Don Perking, RN Outcome: Progressing   Problem: Nutrition: Goal: Adequate nutrition will be maintained 08/14/2019 1323 by Don Perking, RN Outcome: Not Met (add Reason) 08/14/2019 1001 by Don Perking, RN Outcome: Progressing   Problem: Coping: Goal: Level of anxiety will decrease 08/14/2019 1323 by Don Perking, RN Outcome: Not Met (add Reason) 08/14/2019 1001 by Don Perking, RN Outcome: Progressing   Problem: Elimination: Goal: Will not experience complications related to bowel motility 08/14/2019 1323 by Don Perking, RN Outcome: Not Met (add Reason) 08/14/2019 1001 by Don Perking, RN Outcome: Progressing Goal: Will not experience complications related to urinary retention 08/14/2019 1323 by Don Perking, RN Outcome: Not Met (add Reason) 08/14/2019 1001 by Don Perking, RN Outcome: Progressing   Problem: Pain Managment: Goal: General experience of comfort will improve 08/14/2019 1323 by Don Perking, RN Outcome: Not Met (add Reason) 08/14/2019 1001 by Don Perking, RN Outcome: Progressing   Problem: Safety: Goal: Ability to remain free from injury will improve 08/14/2019 1323 by Don Perking, RN Outcome: Not Met (add Reason) 08/14/2019 1001 by Don Perking, RN Outcome: Progressing   Problem: Skin Integrity: Goal: Risk for impaired skin integrity will decrease 08/14/2019 1323 by Don Perking, RN Outcome: Not Met (add Reason) 08/14/2019 1001 by Don Perking, RN Outcome: Progressing   Problem: Education: Goal: Knowledge of the prescribed therapeutic regimen will improve 08/14/2019 1323 by Don Perking, RN Outcome: Not Met (add Reason) 08/14/2019 1001 by Don Perking, RN Outcome: Progressing   Problem: Coping: Goal: Ability to identify and develop effective coping behavior will improve 08/14/2019 1323 by Don Perking, RN Outcome: Not Met (add Reason) 08/14/2019 1001 by Don Perking, RN Outcome: Progressing   Problem: Clinical Measurements: Goal: Quality of life will improve 08/14/2019 1323 by Don Perking, RN Outcome: Not Met (add Reason) 08/14/2019 1001 by Don Perking, RN Outcome: Progressing   Problem: Respiratory: Goal: Verbalizations of increased ease of respirations will increase 08/14/2019 1323 by  Don Perking, RN Outcome: Not Met (add Reason) 08/14/2019 1001 by Don Perking, RN Outcome: Progressing   Problem: Role Relationship: Goal: Ability to verbalize concerns, feelings, and thoughts to partner or family member will improve 08/14/2019 1323 by Don Perking, RN Outcome: Not Met (add Reason) 08/14/2019 1001 by Don Perking, RN Outcome: Progressing   Problem: Pain Management: Goal: Satisfaction with pain management regimen will improve 08/14/2019 1323 by Don Perking, RN Outcome: Not Met (add Reason) 08/14/2019 1001 by Don Perking, RN Outcome: Progressing  Pt going to [facility] for hospice care

## 2019-08-14 NOTE — Discharge Instructions (Signed)
End-of-Life Care End-of-life care is the physical, emotional, mental, and spiritual care a person receives during the last days, weeks, or months of life. Care at the end of a person's life requires a team of professionals, which may include:  Health care providers.  A social worker.  A spiritual adviser. The goal of end-of-life care is to give the patient the highest quality of life possible at the end of life. What are the different types of end-of-life care? There are different options for receiving care at the end of your life. Palliative care This type of care can be delivered at the same time as other treatments. The goal is to manage your symptoms and improve your quality of life, which may include:  Control of pain and other symptoms.  Family support.  Spiritual support.  Emotional and social support.  Comfort. You may need palliative care for months or years to manage a long-term (chronic) disease or condition. Hospice care This is a kind of end-of-life care that may be recommended by your health care providers. Hospice care is usually offered when a person is expected to live for six months or less. Hospice care is designed to provide people who are terminally ill and their families with medical, spiritual, and psychological support. The aim is to improve your quality of life by keeping you as comfortable as possible in the final stages of life. Comfort care This type of care is designed to help meet your basic needs and maintain your overall comfort at the end of your life. This includes:  Caring for your skin.  Making sure that you are breathing well.  Ensuring that you are eating well.  Making sure that you get enough rest.  Making sure that you are at a comfortable body temperature. A plan for comfort care can also address the mental, emotional, and spiritual issues that may come up at the end of your life. Where does end-of-life care take place? End-of-life  care can take place wherever you are living, as long as you get the care you need. End-of-life care can happen:  At your home.  In a nursing home.  In a hospital. You and your loved ones may be able to decide where end-of-life care takes place. This decision depends on:  Your wishes.  Your comfort.  The medical equipment you need. How do I know when it is time for end-of-life care? Your health care provider may tell you that treatments can no longer control your illness. You may also decide that you do not want to undergo the treatments that are available. Talk to your health care provider and your loved ones about your end-of-life care options. If possible, discuss the following topics with your health care provider before you need end-of-life care:  How much medical treatment you want during end-of-life care.  Where you would like to live during end-of-life care.  What kinds of treatments you would like to keep you comfortable.  Which treatments you would refuse.  Your faith or spiritual needs at the end of your life.  Who will handle practical details, such as your will, finances, and funeral planning. You can create legal documents (advance directives) to let your loved ones know your wishes for end-of-life care. Talk to your health care provider or a lawyer about making a living will that explains your medical wishes. You can also have a medical power of attorney. This designates a person to make health decisions for you if you cannot make   them yourself. Summary  End-of-life care is the physical, emotional, mental, and spiritual care a person receives during the last days, weeks, or months of life.  The goal of end-of-life care is to give the patient the highest quality of life possible at the end of life.  There are a number of different options for receiving this care, including palliative care, hospice care, or comfort care.  Talk to your health care provider and your  loved ones about your preferences for end-of-life care. This includes the place to receive care, the kind of care you want to receive, the care you want to decline, your spiritual needs, and your finances. This information is not intended to replace advice given to you by your health care provider. Make sure you discuss any questions you have with your health care provider. Document Revised: 03/06/2017 Document Reviewed: 03/06/2017 Elsevier Patient Education  2020 [employer]  

## 2019-08-14 NOTE — TOC Transition Note (Signed)
Transition of Care [facility]) - CM/SW Discharge Note   Patient Details  Name: Joshua Gonzales MRN: [id number] Date of Birth: [birth year]  Transition of Care [facility]) CM/SW Contact:  Luiz Blare Phone Number: 08/14/2019, 1:27 PM   Clinical Narrative:    Patient will DC to: [facility] Family notified: Fabio Pierce by: Sharin Mons   RN, patient, patient's family, and facility notified of DC. Discharge Summary and FL2 sent to facility. RN to call report prior to discharge [phone]). DC packet on chart. Ambulance transport requested for patient.   CSW will sign off for now as social work intervention is no longer needed. Please consult [place] again if new needs arise.    Final next level of care: Hospice Medical Facility Barriers to Discharge: No Barriers Identified   Patient Goals and CMS Choice   CMS Medicare.gov Compare Post Acute Care list provided to:: Patient Represenative (must comment) (Niece) Choice offered to / list presented to : Patient, Adult Children  Discharge Placement                Patient to be transferred to facility by: PTAR Name of family member notified: Toniann Fail Patient and family notified of of transfer: 08/14/19  Discharge Plan and Services     Post Acute Care Choice: Hospice                               Social Determinants of Health (SDOH) Interventions     Readmission Risk Interventions No flowsheet data found.

## 2019-08-14 NOTE — Progress Notes (Signed)
Chest tube non-functional.  No air leak noted on sxn Pt w/ plan to transfer to [facility] place.   Chest tube removed Occlusive dressing placed Pt tolerated well  Simonne Martinet ACNP-BC [facility] Pulmonary/Critical Care Pager # [phone] OR # [phone] if no answer

## 2019-08-14 NOTE — Progress Notes (Signed)
Report called to [facility]. Waiting for PTAR.

## 2019-08-14 NOTE — Progress Notes (Signed)
PTAR here to transport pt to [facility].

## 2019-08-14 NOTE — TOC Progression Note (Signed)
Transition of Care [facility]) - Progression Note    Patient Details  Name: Joshua Gonzales MRN: [id number] Date of Birth: [birth year]  [facility] Care [facility]) CM/SW Contact  Nonda Lou, [place] Phone Number: 08/14/2019, 12:12 PM  Clinical Narrative:    CSW spoke with [facility] and was informed family is working on consents for residential hospice. CSW will be updated once consents have been completed and it is confirmed a bed is available. CSW will continue to follow.   Expected Discharge Plan: Hospice Medical Facility Barriers to Discharge: Hospice Bed not available  Expected Discharge Plan and Services Expected Discharge Plan: Hospice Medical Facility     Post Acute Care Choice: Hospice Living arrangements for the past 2 months: Apartment                                       Social Determinants of Health (SDOH) Interventions    Readmission Risk Interventions No flowsheet data found.

## 2019-08-14 NOTE — Discharge Summary (Signed)
Physician Discharge Summary  Joshua Gonzales TGY:[id number] DOB: [birth year] DOA: 08/11/2019  PCP: Seward Carol, MD  Admit date: 08/11/2019 Discharge date: 08/14/2019  Time spent: 50 minutes  Recommendations for Outpatient Follow-up:  1.  Patient will be discharged to residential hospice home at [place].  Follow-up with MD at [facility].   Discharge Diagnoses:  Principal Problem:   Acute respiratory failure with hypoxia (HCC) Active Problems:   Essential hypertension, benign   History of seizures   Dementia with behavioral disturbance (HCC)   PAF (paroxysmal atrial fibrillation) (HCC)   Type 2 diabetes mellitus without complication (HCC)   Pneumothorax   DNR (do not resuscitate)   Failure to thrive in adult   Comfort measures only status   Palliative care patient   Discharge Condition: Stable  Diet recommendation: PEG tube/tube feeds.  Filed Weights   08/12/19 1806  Weight: 88.9 kg    History of present illness:  HPI per Dr. Corine Shelter is a 79 y.o. male with history of dementia, sacral decubitus bedbound seizure disorder was brought to the ER patient was found to be short of breath over the last 24 hours.  X-rays done at the facility was found to have a pneumothorax.  ED Course: In the ER patient was found to be short of breath and chest x-ray confirms a pneumothorax and had a chest tube placed by ER physician following which the pneumothorax improved.  Dr. Roxy Manns cardiothoracic surgery was consulted.  Shortly after that patient became more short of breath repeat chest x-ray shows worsening pneumothorax for which I did discuss with ER physician Dr. Roxanne Mins and after manipulating the chest tube by Dr. Roxanne Mins chest x-ray repeated again still shows pneumothorax but patient is presently hemodynamically stable.    Hospital Course:  1 spontaneous pneumothorax in patient with known history of COPD Status post chest tube placement.  Patient seen in consultation by  PCCM and CT surgery.  Per CT surgery patient not a candidate for surgical intervention(unless patient dramatically improved the may consider large bore chest tube and/or endobronchial valves) and recommended palliative care consultation.  Chest x-ray done 08/12/2019 with stable left pneumothorax.  Chest x-ray done the morning of 08/13/2019, with increasing size of large left pneumothorax currently occupying 50% of the volume of the left hemithorax.  Left sided chest tube similarly positioned.  Patchy multifocal interstitial airspace disease again noted throughout lungs bilaterally similar to prior study.  PCCM follow the patient for chest tube management.  CT surgery signed off.  Palliative care met with patient's family and decision was made to transition to full comfort measures.  Chest tube was removed on day of discharge by PCCM.  Patient will be discharged to residential hospice home at [facility].  2.  Acute metabolic encephalopathy Patient with a history of advanced dementia.  Per discharge notes from 01/2019 patient alert to name only at that time and patient likely close to baseline.  Palliative care met with family for goals of care discussion and decision was made to transition to full comfort measures.  Patient will be discharged to residential hospice home.  3.  History of seizures Patient maintained on home regimen of Depakote and Keppra.  Palliative care met with family and decision was made to transition to full comfort measures.  Patient will be discharged to residential hospice home.    4.  History of dementia on Aricept Remained stable.  5.  Hypertension BP remained stable throughout the hospitalization.  6.  Paroxysmal atrial fibrillation Remained rate controlled. Not a candidate for anticoagulation.  Palliative care met with family and decision was made to transition to full comfort measures.  Patient will be discharged to residential hospice home.   7.   FTT/Nutrition Patient initially maintained on PEG tube feeds.  Palliative care to met with family for goals of care discussion and decision was made to transition to full comfort measures.  Patient was discharged to residential hospice home..  8.  Diabetes mellitus type 2 Hemoglobin A1c 6.7.  CBG 130.  Patient transitioned to comfort measures.  9.  Chronic sacral and multiple wounds, POA Patient seen by wound care.  Continue current wound care regimen.  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 red, healing (Active)  11/23/18 1330  Location: Sacrum  Location Orientation: Medial  Staging: Stage 4 - Full thickness tissue loss with exposed bone, tendon or muscle.  Wound Description (Comments): red, healing  Present on Admission: Yes     Pressure Injury 11/23/18 Heel Left Unstageable - Full thickness tissue loss in which the base of the ulcer is covered by slough (yellow, tan, gray, green or brown) and/or eschar (tan, brown or black) in the wound bed. (Active)  11/23/18 1330  Location: Heel  Location Orientation: Left  Staging: Unstageable - Full thickness tissue loss in which the base of the ulcer is covered by slough (yellow, tan, gray, green or brown) and/or eschar (tan, brown or black) in the wound bed.  Wound Description (Comments):   Present on Admission: Yes     Pressure Injury 11/23/18 Heel Right Unstageable - Full thickness tissue loss in which the base of the ulcer is covered by slough (yellow, tan, gray, green or brown) and/or eschar (tan, brown or black) in the wound bed. (Active)  11/23/18 1330  Location: Heel  Location Orientation: Right  Staging: Unstageable - Full thickness tissue loss in which the base of the ulcer is covered by slough (yellow, tan, gray, green or brown) and/or eschar (tan, brown or black) in the wound bed.  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 greenish color to drainage, open wound, palpable sacrum( Physical Therapy) (Active)  11/25/18 1030  Location: Sacrum  Location Orientation: Medial  Staging: Stage IV - Full thickness tissue loss with exposed bone, tendon or muscle.  Wound Description (Comments): greenish color to drainage, open wound, palpable sacrum( Physical Therapy)  Present on Admission:        Procedures:  Chest x-ray 08/13/2019, 08/12/2019  Chest tube placement 08/11/2019   Consultations:  PCCM: Dr. Icard 08/12/2019  Cardiothoracic surgery: Dr. Owen 08/12/2019  Wound care  Palliative care Casey Mahan, NP 08/12/2019  Discharge Exam: Vitals:   08/13/19 1949 08/14/19 0702  BP: (!) 128/91 (!) 148/86  Pulse: 78 73  Resp: (!) 25 18  Temp: (!) 97.4 F (36.3 C) 98.2 F (36.8 C)  SpO2: 99% 99%    General: NAD Cardiovascular: RRR Respiratory: Decreased breath sounds on the left.  Minimal expiratory wheezing.  Discharge Instructions   Discharge Instructions    Diet general   Complete by: As directed    Comfort feeds.   Discharge wound care:   Complete by: As directed    As noted above.   Increase activity slowly   Complete by:   As directed      Allergies as of 08/14/2019   No Known Allergies     Medication List    STOP taking these medications   CertaVite/Antioxidants Tabs   collagenase ointment Commonly known as: SANTYL   Pro-Stat Liqd   traMADol 50 MG tablet Commonly known as: ULTRAM     TAKE these medications   acetaminophen 325 MG tablet Commonly known as: TYLENOL Take 2 tablets (650 mg total) by mouth every 6 (six) hours as needed for mild pain (or Fever >/= 101). What changed: how to take this   amLODipine 5 MG tablet Commonly known as: NORVASC Take 1 tablet (5 mg total) by mouth daily. What changed: how to take this   aspirin 81 MG chewable tablet Chew 81 mg by mouth  daily.   donepezil 10 MG tablet Commonly known as: ARICEPT Place 10 mg into feeding tube at bedtime.   furosemide 20 MG tablet Commonly known as: LASIX Place 20 mg into feeding tube daily.   ipratropium-albuterol 0.5-2.5 (3) MG/3ML Soln Commonly known as: DUONEB Take 3 mLs by nebulization every 6 (six) hours as needed (for shortness of breath or wheezing).   ISOSOURCE 1.5 CAL PO Give 70 mL/hr by tube See admin instructions. ON: 6 PM/OFF 6 AM   lactulose 10 GM/15ML solution Commonly known as: CHRONULAC Place 20 g into feeding tube daily as needed for moderate constipation.   levETIRAcetam 100 MG/ML solution Commonly known as: KEPPRA Place 750 mg into feeding tube 2 (two) times daily.   metoprolol tartrate 25 MG tablet Commonly known as: LOPRESSOR Take 0.5 tablets (12.5 mg total) by mouth 2 (two) times daily.   mirtazapine 15 MG tablet Commonly known as: REMERON Take 15 mg by mouth at bedtime.   morphine CONCENTRATE 10 MG/0.5ML Soln concentrated solution Place 0.25 mLs (5 mg total) under the tongue every 4 (four) hours.   NON FORMULARY Nectar-thick liquids   polyethylene glycol 17 g packet Commonly known as: MIRALAX / GLYCOLAX Take 17 g by mouth 2 (two) times daily. What changed:   how to take this  additional instructions   polyvinyl alcohol 1.4 % ophthalmic solution Commonly known as: LIQUIFILM TEARS Place 1 drop into both eyes 4 (four) times daily as needed for dry eyes.   senna 8.6 MG Tabs tablet Commonly known as: SENOKOT Take 1 tablet (8.6 mg total) by mouth 2 (two) times daily.   sterile water injection Place 250 mLs into feeding tube every 6 (six) hours.   valproic acid 250 MG/5ML solution Commonly known as: DEPAKENE Place 125 mg into feeding tube 2 (two) times daily.            Discharge Care Instructions  (From admission, onward)         Start     Ordered   08/14/19 0000  Discharge wound care:       Comments: As noted above.    08/14/19 1314         No Known Allergies  Follow-up Information    MD AT [facility] Follow up.                The results of significant diagnostics from this hospitalization (including imaging, microbiology, ancillary and laboratory) are listed below for reference.    Significant Diagnostic Studies: DG Chest 1 View  Result Date: 08/12/2019 CLINICAL DATA:  Short of breath, left pneumothorax, decreased left breath sounds EXAM: CHEST  1 VIEW COMPARISON:  01/07/2019 FINDINGS: Single frontal view   of the chest demonstrates a large left-sided pneumothorax volume estimated greater than 50%. No tension effect or midline shift. Increased vascularity within the right lung consistent with vascular redistribution. No pleural effusion. IMPRESSION: 1. Large greater than 50% left-sided pneumothorax without tension effect. 2. Vascular congestion within the aerated right lung, likely representing vascular redistribution. These results were called by telephone at the time of interpretation on 08/12/2019 at 12:00 am to provider DAVID GLICK , who verbally acknowledged these results. Electronically Signed   By: Michael  Brown M.D.   On: 08/12/2019 00:00   DG Chest Port 1 View  Result Date: 08/13/2019 CLINICAL DATA:  78-year-old male with history of abnormal respiration rate. EXAM: PORTABLE CHEST 1 VIEW COMPARISON:  Chest x-ray 08/12/2019. FINDINGS: Left-sided chest tube is in similar position with tip projecting over the mid left hemithorax. Significant enlargement of the left pneumothorax which now occupies approximately 50% of the volume of the left hemithorax. Patchy multifocal interstitial and airspace disease again noted throughout the lungs bilaterally, most confluent in the left base and right upper lobe. No right pneumothorax. No pleural effusions. No evidence of pulmonary edema. Heart size is normal. Upper mediastinal contours are within normal limits. Aortic atherosclerosis. IMPRESSION: 1. Interval  increase in size of large left pneumothorax, currently occupying approximately 50% of the volume of the left hemithorax. Left-sided chest tube is similarly positioned. 2. Patchy multifocal interstitial and airspace disease again noted throughout the lungs bilaterally, similar to the prior study. 3. Aortic atherosclerosis. Electronically Signed   By:   Entrikin M.D.   On: 08/13/2019 09:11   DG CHEST PORT 1 VIEW  Result Date: 08/12/2019 CLINICAL DATA:  Follow-up left-sided chest tube EXAM: PORTABLE CHEST 1 VIEW COMPARISON:  Film from earlier in the same day. FINDINGS: Left chest tube is again identified and stable. Small left pneumothorax is seen stable in appearance from the prior exam. No new focal infiltrate or effusion is seen. Coarsened interstitial markings are again noted. Cardiac shadow is stable. IMPRESSION: Stable left pneumothorax with chest tube in place. Electronically Signed   By: Mark  Lukens M.D.   On: 08/12/2019 12:27   DG Chest Portable 1 View  Result Date: 08/12/2019 CLINICAL DATA:  Chest tube EXAM: PORTABLE CHEST 1 VIEW COMPARISON:  4:30 a.m. today FINDINGS: Decreased left pneumothorax, now small to moderate volume. The chest tube has a more open retention loop but is in stable position. Cardiomegaly and interstitial coarsening. IMPRESSION: Decreasing left pneumothorax. Electronically Signed   By: Jonathon  Watts M.D.   On: 08/12/2019 06:59   DG Chest Portable 1 View  Result Date: 08/12/2019 CLINICAL DATA:  Chest tube EXAM: PORTABLE CHEST 1 VIEW COMPARISON:  Earlier today FINDINGS: Sizable left pneumothorax measuring up to 2.8 cm in thickness, unchanged from most recent prior. The chest tube is in stable position. Interstitial opacity on both sides. Cardiomegaly. IMPRESSION: Unchanged moderate to large left pneumothorax when compared to the most recent prior. Electronically Signed   By: Jonathon  Watts M.D.   On: 08/12/2019 04:59   DG CHEST PORT 1 VIEW  Result Date:  08/12/2019 CLINICAL DATA:  Hypertension, diabetes, stroke EXAM: PORTABLE CHEST 1 VIEW COMPARISON:  Radiograph 08/12/2019 FINDINGS: Left pigtail pleural drain remains in place in the mid chest. Interval reexpansion of a now moderate to large left pneumothorax both laterally and medially with a volume estimated at approximately 40-60%. Increasing attenuation within left lung likely reflecting some atelectatic changes. Albeit with more patchy opacity in the lung base. Mixed reticular and patchy   opacities are also present in the right lung, similar to the comparison exam with band of opacity in mid lung compatible with scarring. Cardiomediastinal contours are stable. The aorta is calcified. The remaining cardiomediastinal contours are unremarkable. No acute osseous or soft tissue abnormality. Degenerative changes are present in the imaged spine and shoulders. Telemetry leads overlie the chest. IMPRESSION: 1. Interval reexpansion of a now moderate to large left pneumothorax both laterally and medially with a volume estimated at approximately 40-60%. 2. Increasing attenuation within the left lung likely reflecting atelectatic changes. 3. Coarse opacities throughout the right lung are similar to prior. 4.  Aortic Atherosclerosis (ICD10-I70.0). Critical Value/emergent results were called by telephone at the time of interpretation on 08/12/2019 at 3:49 am to provider ARSHAD KAKRAKANDY , who verbally acknowledged these results. Electronically Signed   By: Price  DeHay M.D.   On: 08/12/2019 03:49   DG Chest Portable 1 View  Result Date: 08/12/2019 CLINICAL DATA:  Chest tube placement EXAM: PORTABLE CHEST 1 VIEW COMPARISON:  08/11/2019 FINDINGS: There is no left-sided chest tube. The left lung has re-expanded but there is a small lateral residual pneumothorax. Cardiomediastinal contours and the right lung are unchanged. IMPRESSION: Partial re-expansion of the left lung with small lateral residual pneumothorax. Electronically  Signed   By: Kevin  Herman M.D.   On: 08/12/2019 01:23    Microbiology: Recent Results (from the past 240 hour(s))  SARS Coronavirus 2 by RT PCR (hospital order, performed in [place] hospital lab) Nasopharyngeal Nasopharyngeal Swab     Status: None   Collection Time: 08/11/19 11:08 PM   Specimen: Nasopharyngeal Swab  Result Value Ref Range Status   SARS Coronavirus 2 NEGATIVE NEGATIVE Final    Comment: (NOTE) SARS-CoV-2 target nucleic acids are NOT DETECTED. The SARS-CoV-2 RNA is generally detectable in upper and lower respiratory specimens during the acute phase of infection. The lowest concentration of SARS-CoV-2 viral copies this assay can detect is 250 copies / mL. A negative result does not preclude SARS-CoV-2 infection and should not be used as the sole basis for treatment or other patient management decisions.  A negative result may occur with improper specimen collection / handling, submission of specimen other than nasopharyngeal swab, presence of viral mutation(s) within the areas targeted by this assay, and inadequate number of viral copies (<250 copies / mL). A negative result must be combined with clinical observations, patient history, and epidemiological information. Fact Sheet for Patients:   https://www.fda.gov/media/136312/download Fact Sheet for Healthcare Providers: https://www.fda.gov/media/136313/download This test is not yet approved or cleared  by the [place] FDA and has been authorized for detection and/or diagnosis of SARS-CoV-2 by FDA under an Emergency Use Authorization (EUA).  This EUA will remain in effect (meaning this test can be used) for the duration of the COVID-19 declaration under Section 564(b)(1) of the Act, 21 U.S.C. section 360bbb-3(b)(1), unless the authorization is terminated or revoked sooner. Performed at [facility], [address] [place], [place] [address]   MRSA PCR Screening     Status: None   Collection Time:  08/13/19  1:34 PM   Specimen: Nasopharyngeal  Result Value Ref Range Status   MRSA by PCR NEGATIVE NEGATIVE Final    Comment:        The GeneXpert MRSA Assay (FDA approved for NASAL specimens only), is one component of a comprehensive MRSA colonization surveillance program. It is not intended to diagnose MRSA infection nor to guide or monitor treatment for MRSA infections. Performed at [facility], [place] [address], [place], [place] [address]      Labs: Basic Metabolic Panel: Recent Labs  Lab 08/11/19 2304 08/12/19 0832 08/13/19 0220  NA 136 142 143  K 4.2 4.9 4.5  CL 98 105 107  CO2 _0 GLUCOSE 157* 127* 141*  BUN 34* 30* 32*  CREATININE 0.87 1.01 0.85  CALCIUM 9.1 9.2 9.0  MG  --   --  2.4  PHOS  --   --  3.7   Liver Function Tests: Recent Labs  Lab 08/12/19 0832 08/13/19 0220  AST 24 28  ALT 15 16  ALKPHOS 68 65  BILITOT 0.6 0.6  PROT 7.9 7.4  ALBUMIN 3.3* 2.9*   No results for input(s): LIPASE, AMYLASE in the last 168 hours. No results for input(s): AMMONIA in the last 168 hours. CBC: Recent Labs  Lab 08/11/19 2304 08/12/19 0832 08/13/19 0220  WBC 6.7 8.1 5.4  NEUTROABS 3.5 4.9 2.8  HGB 14.7 14.4 13.7  HCT 46.4 46.4 44.1  MCV 99.1 100.9* 99.8  PLT 166 167 154   Cardiac Enzymes: No results for input(s): CKTOTAL, CKMB, CKMBINDEX, TROPONINI in the last 168 hours. BNP: BNP (last 3 results) No results for input(s): BNP in the last 8760 hours.  ProBNP (last 3 results) No results for input(s): PROBNP in the last 8760 hours.  CBG: Recent Labs  Lab 08/12/19 2027 08/12/19 2350 08/13/19 0336 08/13/19 0808 08/13/19 1137  GLUCAP 166* 168* 130* 145* 118*       Signed:  Irine Seal MD.  [facility] 08/14/2019, 1:25 PM

## 2019-08-14 NOTE — Progress Notes (Signed)
Daily Progress Note   Patient Name: Joshua Gonzales       Date: 08/14/2019 DOB: 1940-05-20  Age: 79 y.o. MRN#: [id number] Attending Physician: Rodolph Bong, MD Primary Care Physician: [name], MD Admit Date: 08/11/2019  Reason for Consultation/Follow-up: Terminal Care, pain control  HPI/Patient Profile:78 y.o.malechronically ill male with history of advanced dementia, seizure disorder, DM2 presents to [facility] Emergency Department6/7/2021from the skilled nursing facility where he resides with reported increased shortness of breath overnight.Chest x-ray revealed left pneumothorax. Left chest tube was inserted by ED staff with initial chest x-ray showing near complete resolution in pneumothorax. Patient later had episodes of hypoxia and increased O2 requirements, with repeat chest x-ray showing partial lung collapse. Chest tube was adjusted by Pulmonary critical care.  Patient is noted to have a multiple co-morbid conditions, PEG tube, sacral wound, and generalized failure to thrive.He is a long-term resident of [facility].Palliative care was consulted to assist with goals of care.   6/9--Per CT surgery, pneumothorax has increased in size. They recommend Hospice care without additional medical interventions.   Subjective: Patient in bed, appears comfortable. When I asked him how he was doing, he states "ok".  No family at bedside. Bedside RN states patient has appeared comfortable until she attempts to provide care (turning, mouth care, etc) and then he is very resistant, stating "leave me alone". Currently on O2 at 3L, I turned this down to 2L and asked nursing not to titrate.  Have viewed TOC and hospice notes--patient has been accepted to [facility] and is awaiting a bed.  I  updated niece Toniann Fail via phone that patient was comfortable and was receiving scheduled pain medicine, and also was alert and would benefit from family visitation at [facility].  Length of Stay: 2  Current Medications: Scheduled Meds:  . levETIRAcetam  750 mg Per Tube BID  . morphine CONCENTRATE  5 mg Sublingual Q4H  . polyethylene glycol  17 g Per Tube BID  . senna  1 tablet Per Tube BID  . valproic acid  125 mg Per Tube BID    Continuous Infusions:   PRN Meds: acetaminophen **OR** acetaminophen, antiseptic oral rinse, glycopyrrolate, ipratropium-albuterol, LORazepam, morphine injection, ondansetron **OR** ondansetron (ZOFRAN) IV, polyvinyl alcohol  Physical Exam Vitals reviewed.  Constitutional:      General: He  is not in acute distress.    Comments: Chronically ill-appearing, frail  HENT:     Head: Normocephalic and atraumatic.  Pulmonary:     Effort: Pulmonary effort is normal.     Comments: Left chest tube in place. O2 at 2L Neurological:     Mental Status: He is alert.     Comments: Oriented to self             Vital Signs: BP (!) 148/86 (BP Location: Left Arm)   Pulse 73   Temp 98.2 F (36.8 C) (Oral)   Resp 18   Ht 6' (1.829 m)   Wt 88.9 kg   SpO2 99%   BMI 26.58 kg/m  SpO2: SpO2: 99 % O2 Device: O2 Device: Nasal Cannula O2 Flow Rate: O2 Flow Rate (L/min): 3 L/min  Intake/output summary:   Intake/Output Summary (Last 24 hours) at 08/14/2019 1211 Last data filed at 08/13/2019 1930 Gross per 24 hour  Intake --  Output 0 ml  Net 0 ml   LBM: Last BM Date: 08/13/19 Baseline Weight: Weight: 88.9 kg Most recent weight: Weight: 88.9 kg       Palliative Assessment/Data:10%      Patient Active Problem List   Diagnosis Date Noted  . Failure to thrive in adult   . Comfort measures only status   . Pneumothorax 08/12/2019  . Acute respiratory failure with hypoxia ([place]) 08/12/2019  . DNR (do not resuscitate)   . Pneumothorax on left   . Advanced  care planning/counseling discussion   . Fever of unknown origin   . Sacral wound 01/07/2019  . HTN (hypertension) 01/07/2019  . Alzheimer's dementia ([place]) 01/07/2019  . Osteomyelitis of vertebra, sacral and sacrococcygeal region ([place])   . Pressure injury of skin 11/24/2018  . Acute metabolic encephalopathy 85/46/2703  . Cellulitis 11/23/2018  . Osteomyelitis ([place]) 11/23/2018  . Constipation 11/23/2018  . Goals of care, counseling/discussion   . Palliative care by specialist   . FTT (failure to thrive) in adult   . Type 2 diabetes mellitus without complication ([place]) 50/11/3816  . History of CVA with residual deficit 08/16/2018  . Hyperammonemia ([place]) 08/16/2018  . Bradycardia 08/16/2018  . Lactic acid increased   . Hypotension 06/03/2014  . Near syncope 06/03/2014  . AKI (acute kidney injury) ([place]) 06/03/2014  . 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 Dementia with behavioral disturbance ([place])   . PAF (paroxysmal atrial fibrillation) ([place])   . History of seizures 05/15/2014  . Essential hypertension, benign 01/24/2013  . Unspecified late effects of cerebrovascular disease 01/24/2013  . Acute encephalopathy 11/26/2012  . Hypokalemia 11/26/2012  . 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 Transitioned to comfort care 6/9 and is awaiting a bed at [facility].   Recommendations/Plan: - DNR (present on admission) - Continue comfort care - continue Morphine CONCENTRATE 10 mg/0.38ml oral solution 5 mg SL every 4 hours - continue Morphine 1 mg IV every 2 hours prn for breakthrough pain - chest tube removal prior to discharge  Goals of Care and Additional Recommendations:  Limitations on Scope of Treatment: Full Comfort Care  Code Status:  DNR  Prognosis:   < 2 weeks  Discharge Planning:  Hospice facility  Care plan was discussed with bedside RN  Thank you for allowing the Palliative  Medicine Team to assist in the care of this patient.   Total Time 35 minutes Prolonged Time Billed  no       Greater than 50%  of this time was spent counseling and coordinating care related to the above assessment and plan.  Lavena Bullion, NP  Please contact Palliative Medicine Team phone at [phone] for questions and concerns.

## 2019-08-14 NOTE — Progress Notes (Signed)
NAME:  Joshua Gonzales, MRN:  [id number], DOB:  05/13/1940, LOS: 2 ADMISSION DATE:  08/11/2019, CONSULTATION DATE: 08/12/2019 REFERRING MD: [facility], CHIEF COMPLAINT: Malfunctioning chest tube  Brief History   79 year old male failure to thrive.  History of present illness   79 year old frail male multiple medical comorbidities he was very short of breath at the skilled nursing facility where he resides.  Chest x-ray revealed left pneumothorax.  He was transferred to [facility] emergency department left chest tube inserted per the emergency department physician with partial resolution left pneumothorax.  Chest tube was not functioning properly therefore pulmonary critical care was called to the bedside to evaluate and solve the problem.  Chest tube was adjusted air leak was noted with no acute distress per the patient.  He is a DNR.  Palliative care has been called.  He has extensive sacral ulcer, PEG tube in place generalized failure to thrive in a poor quality of life.  Pulmonary critical care will be available to help manage the chest tube at this time.  Past Medical History  Dementia Alzheimer's PEG tube Seizure disorder Failure to thrive DNR   Significant Hospital Events   Left chest tube insertion  Consults:  08/12/2019 pulmonary critical care 08/12/2019 palliative care  Procedures:  08/12/2019 left chest tube insertion per emergency department physician  Significant Diagnostic Tests:    Micro Data:    Antimicrobials:    Interim history/subjective:  Left chest tube remains in place and has a 50% pneumothorax.  Plan is for him to be transferred to [facility] on hospice care. Objective   Blood pressure (!) 128/91, pulse 78, temperature (!) 97.4 F (36.3 C), temperature source Axillary, resp. rate (!) 25, height 6' (1.829 m), weight 88.9 kg, SpO2 99 %.        Intake/Output Summary (Last 24 hours) at 08/14/2019 0852 Last data filed at 08/13/2019 1930 Gross per 24 hour    Intake --  Output 10 ml  Net -10 ml   Filed Weights   08/12/19 1806  Weight: 88.9 kg    Examination: General:  contracted male is poorly responsive HENT: No JVD or lymphadenopathy is appreciated poor dentition noted Lungs: Decreased breath sounds throughout.  No air leak is appreciated from chest tube.  08/13/19 still with pnx   Heart sounds are regular regular rate and rhythm Abdomen: PEG tube is in place      Sacral ulcer is noted Extremities contracted  Resolved Hospital Problem list     Assessment & Plan:  Spontaneous left pneumothorax requiring chest tube insertion on 08/12/2019 per   Emergency department physician.  Note pneumothorax is decreased in size but remains. Chest tube is in place chest x-ray from 08/13/2019 reveals a 50% pneumothorax despite chest tube placement Evaluated by cardiovascular thoracic surgery Plan is to transfer to [facility] for palliative care Consider DC in chest tube Pulmonary critical care will be available as needed please call.  Alzheimer's disease, dementia, bedridden, contracted, PEG tube in place Per primary  Seizure disorder Per primary Hypertension Per primary  Generalized failure to thrive in adult with multiple medical comorbidities Palliative care is involved in plan to transfer to [facility] in the near future when beds are available  Best practice:  Diet: tube feeds Pain/Anxiety/Delirium protocol (if indicated): na VAP protocol (if indicated): na DVT prophylaxis: scd GI prophylaxis: ppi Glucose control: na Mobility: bedridden Code Status: dnr Family Communication: per primary. Plan for [facility] placement. Disposition: none at bedside  Labs   CBC: Recent Labs  Lab 08/11/19 2304 08/12/19 0832 08/13/19 0220  WBC 6.7 8.1 5.4  NEUTROABS 3.5 4.9 2.8  HGB 14.7 14.4 13.7  HCT 46.4 46.4 44.1  MCV 99.1 100.9* 99.8  PLT 166 167 097    Basic Metabolic Panel: Recent Labs  Lab 08/11/19 2304  08/12/19 0832 08/13/19 0220  NA 136 142 143  K 4.2 4.9 4.5  CL 98 105 107  CO2 23 28 28   GLUCOSE 157* 127* 141*  BUN 34* 30* 32*  CREATININE 0.87 1.01 0.85  CALCIUM 9.1 9.2 9.0  MG  --   --  2.4  PHOS  --   --  3.7   GFR: Estimated Creatinine Clearance: 81.2 mL/min (by C-G formula based on SCr of 0.85 mg/dL). Recent Labs  Lab 08/11/19 2304 08/12/19 0832 08/13/19 0220  WBC 6.7 8.1 5.4    Liver Function Tests: Recent Labs  Lab 08/12/19 0832 08/13/19 0220  AST 24 28  ALT 15 16  ALKPHOS 68 65  BILITOT 0.6 0.6  PROT 7.9 7.4  ALBUMIN 3.3* 2.9*   No results for input(s): LIPASE, AMYLASE in the last 168 hours. No results for input(s): AMMONIA in the last 168 hours.  ABG    Component Value Date/Time   HCO3 28.6 (H) 08/16/2018 1117   TCO2 29 08/16/2018 1117   TCO2 30 08/16/2018 1117   O2SAT 99.0 08/16/2018 1117     Coagulation Profile: No results for input(s): INR, PROTIME in the last 168 hours.  Cardiac Enzymes: No results for input(s): CKTOTAL, CKMB, CKMBINDEX, TROPONINI in the last 168 hours.  HbA1C: Hgb A1c MFr Bld  Date/Time Value Ref Range Status  08/12/2019 08:32 AM 6.7 (H) 4.8 - 5.6 % Final    Comment:    (NOTE) Pre diabetes:          5.7%-6.4% Diabetes:              >6.4% Glycemic control for   <7.0% adults with diabetes   11/24/2018 04:27 AM 5.8 (H) 4.8 - 5.6 % Final    Comment:    (NOTE) Pre diabetes:          5.7%-6.4% Diabetes:              >6.4% Glycemic control for   <7.0% adults with diabetes     CBG: Recent Labs  Lab 08/12/19 2027 08/12/19 2350 08/13/19 0336 08/13/19 0808 08/13/19 1137  GLUCAP 166* 168* 130* 145* 118*      Critical care time: na    Pulmonary critical care available as needed.   Richardson Landry Amalee Olsen ACNP Acute Care Nurse Practitioner [place] Please consult Amion 08/14/2019, 8:52 AM

## 2019-08-14 NOTE — Plan of Care (Signed)
  Problem: Education: Goal: Knowledge of General Education information will improve Description: Including pain rating scale, medication(s)/side effects and non-pharmacologic comfort measures Outcome: Progressing   Problem: Health Behavior/Discharge Planning: Goal: Ability to manage health-related needs will improve Outcome: Progressing   Problem: Clinical Measurements: Goal: Ability to maintain clinical measurements within normal limits will improve Outcome: Progressing Goal: Will remain free from infection Outcome: Progressing Goal: Diagnostic test results will improve Outcome: Progressing Goal: Respiratory complications will improve Outcome: Progressing Goal: Cardiovascular complication will be avoided Outcome: Progressing   Problem: Activity: Goal: Risk for activity intolerance will decrease Outcome: Progressing   Problem: Nutrition: Goal: Adequate nutrition will be maintained Outcome: Progressing   Problem: Coping: Goal: Level of anxiety will decrease Outcome: Progressing   Problem: Elimination: Goal: Will not experience complications related to bowel motility Outcome: Progressing Goal: Will not experience complications related to urinary retention Outcome: Progressing   Problem: Pain Managment: Goal: General experience of comfort will improve Outcome: Progressing   Problem: Safety: Goal: Ability to remain free from injury will improve Outcome: Progressing   Problem: Skin Integrity: Goal: Risk for impaired skin integrity will decrease Outcome: Progressing   Problem: Education: Goal: Knowledge of the prescribed therapeutic regimen will improve Outcome: Progressing   Problem: Coping: Goal: Ability to identify and develop effective coping behavior will improve Outcome: Progressing   Problem: Clinical Measurements: Goal: Quality of life will improve Outcome: Progressing   Problem: Respiratory: Goal: Verbalizations of increased ease of respirations will  increase Outcome: Progressing   Problem: Role Relationship: Goal: Ability to verbalize concerns, feelings, and thoughts to partner or family member will improve Outcome: Progressing   Problem: Pain Management: Goal: Satisfaction with pain management regimen will improve Outcome: Progressing  Pt confused, advance dementia, palliative team talked with family.

## 2019-08-14 NOTE — Progress Notes (Addendum)
Manufacturing systems engineer [facility]) Hospital Liaison note.   Received request from [facility] manager for family interest in [facility] with request for transfer today. Chart reviewed and eligibility confirmed. Spoke with Toniann Fail the patients primary contact to confirm interest and explain services. Family agreeable to transfer today. CSW aware.   I will call or follow up with [facility] manager once paper work and consents have been completed.  Dr. Kern Reap to assume care per family request.    RN please call report to [phone]. Please arrange transport for patient once consents are complete for [facility].  Thank you,   Yolande Jolly, RN. BSN   [facility] Liaison (listed on AMION under [facility] [place])   [phone]

## 2020-10-10 IMAGING — CT CT ABDOMEN WITHOUT CONTRAST
2 of 4 series · 16 of 46 positions shown, 18 images · non-contrast
Comparison: None.

CLINICAL DATA: Encounter for gastrojejunal tube placement.

EXAM:
CT ABDOMEN WITHOUT CONTRAST
TECHNIQUE: Multidetector CT imaging of the abdomen was performed following the
standard protocol without IV contrast.

[Series 3: a/p w/o 5mm · axial · non-contrast · 0.77mm/px · z∈[+908,+1153]mm · 13 of 55 slices shown, 15 images]
[im 3/55  soft-tissue]
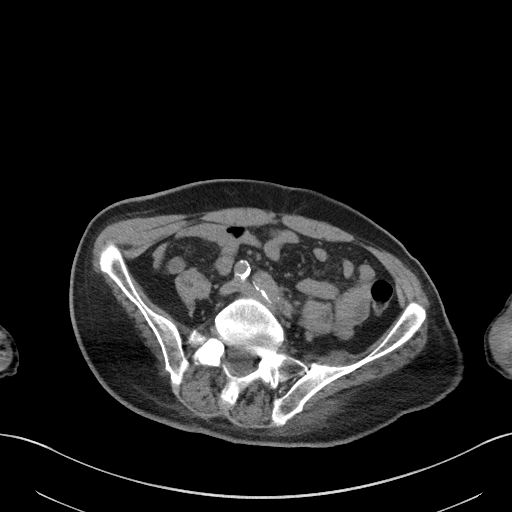
[im 3/55  bone]
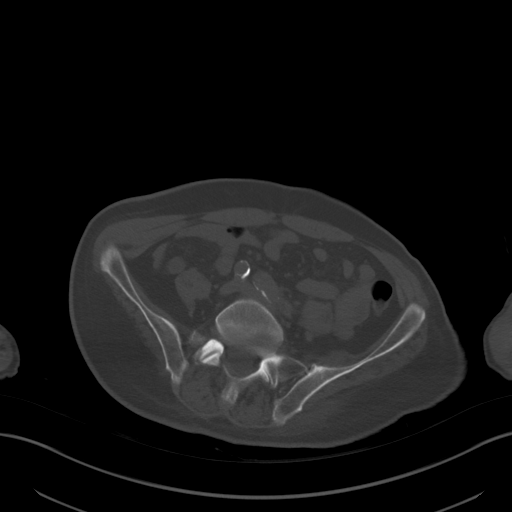
[im 9/55  soft-tissue]
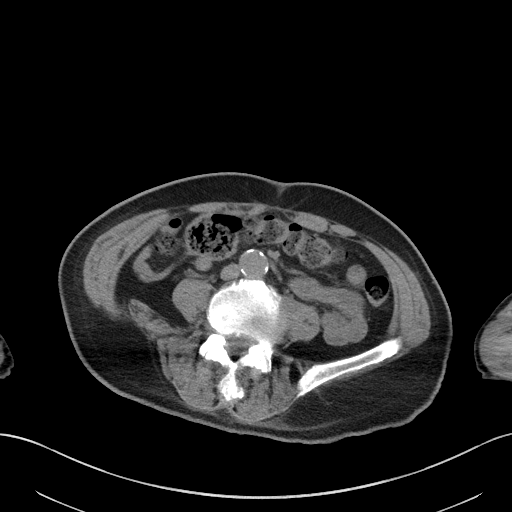
[im 11/55  soft-tissue]
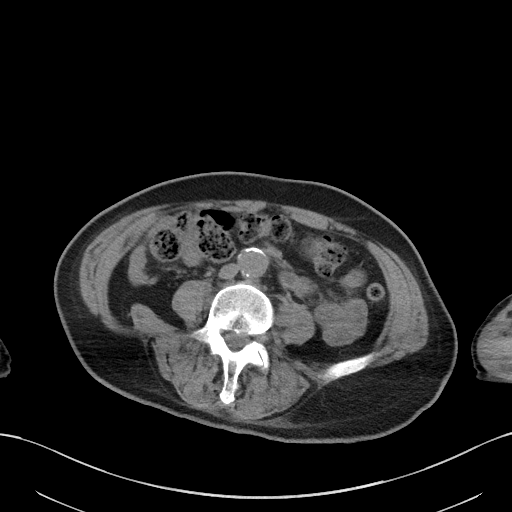
[im 17/55  soft-tissue]
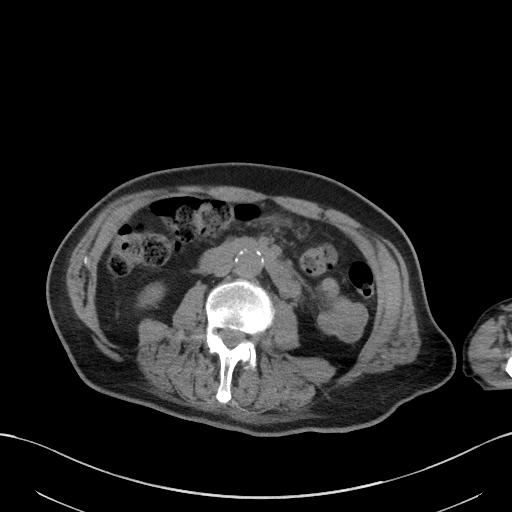
[im 19/55  soft-tissue]
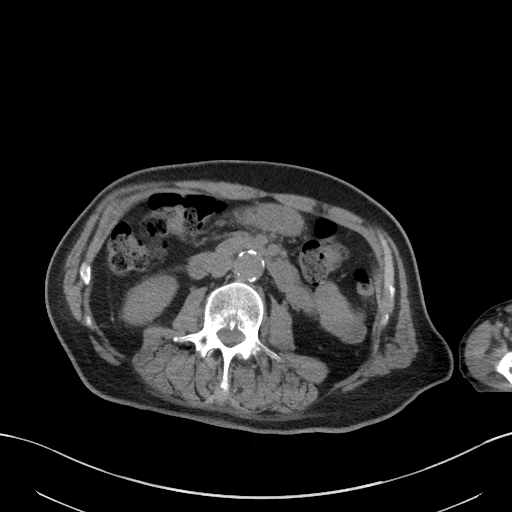
[im 25/55  soft-tissue]
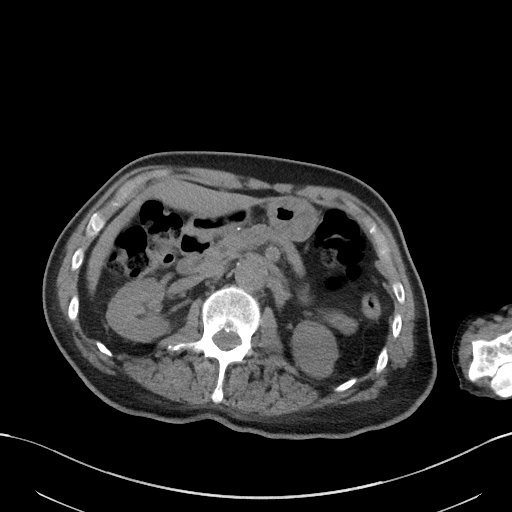
[im 28/55  soft-tissue]
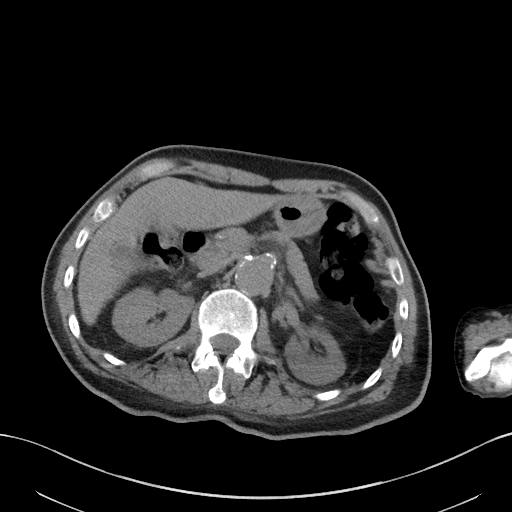
[im 30/55  soft-tissue]
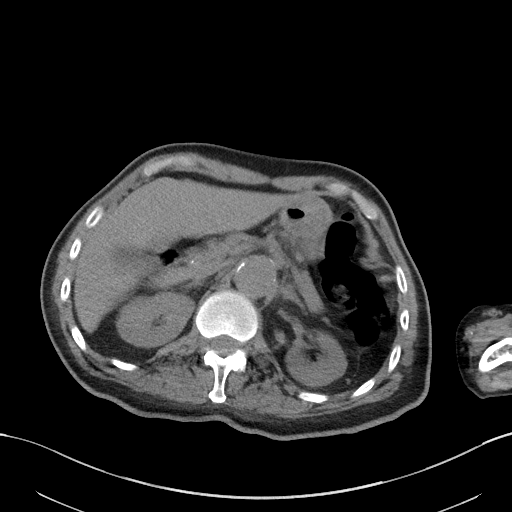
[im 36/55  soft-tissue]
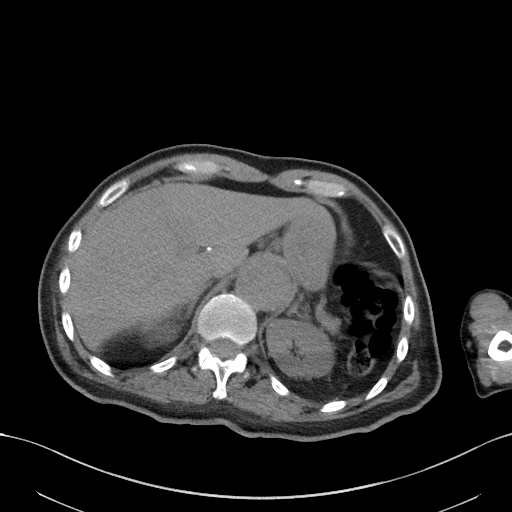
[im 36/55  bone]
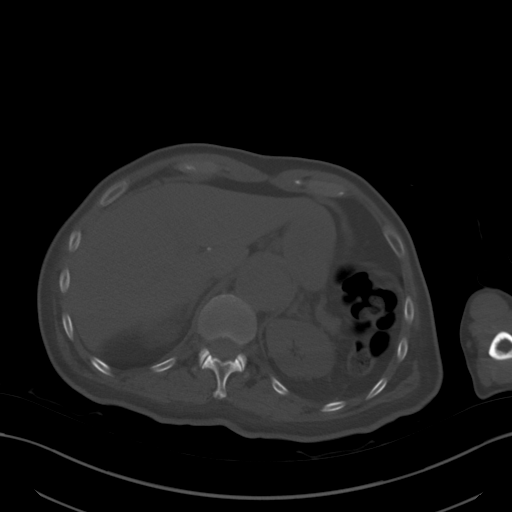
[im 38/55  soft-tissue]
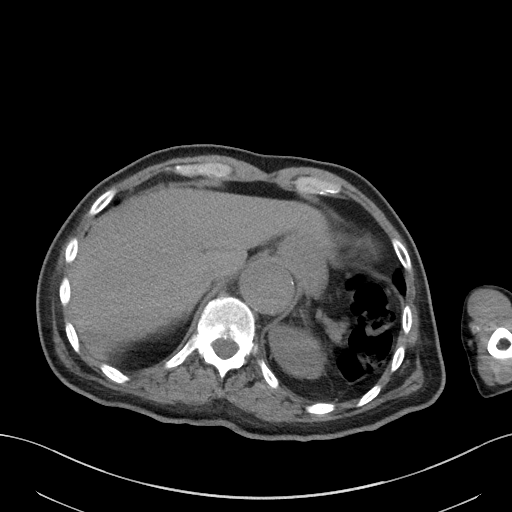
[im 44/55  soft-tissue]
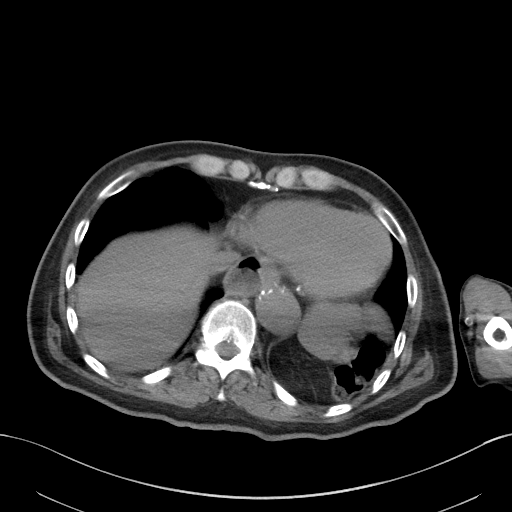
[im 46/55  soft-tissue]
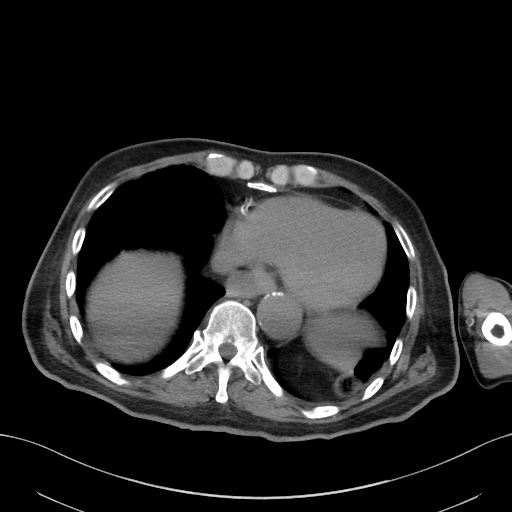
[im 52/55  soft-tissue]
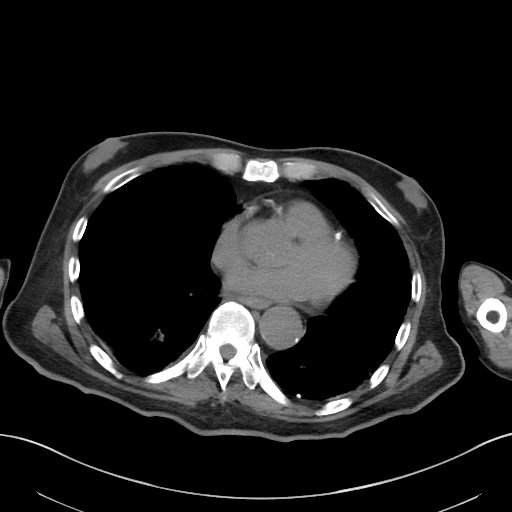

[Series 6: a/p w/o cor · coronal · non-contrast · 0.58mm/px · 3 of 131 slices shown]
[im 44/131  soft-tissue]
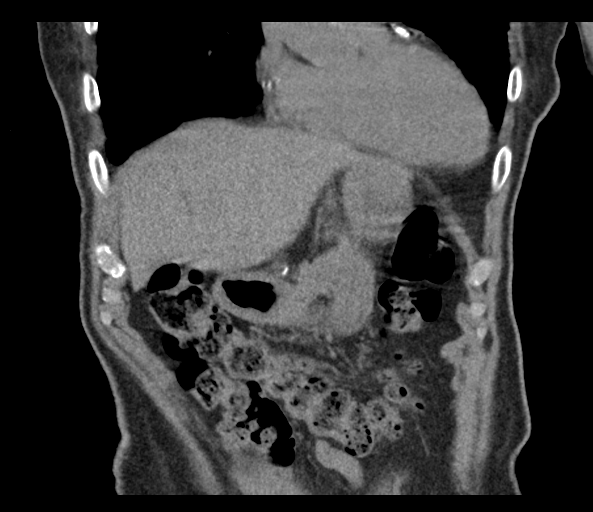
[im 58/131  soft-tissue]
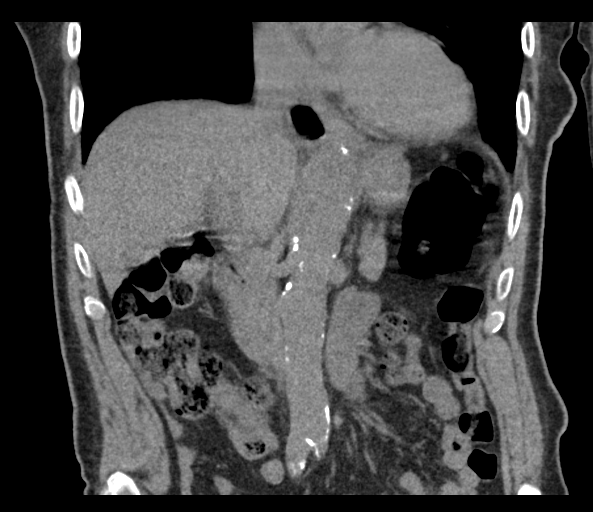
[im 73/131  soft-tissue]
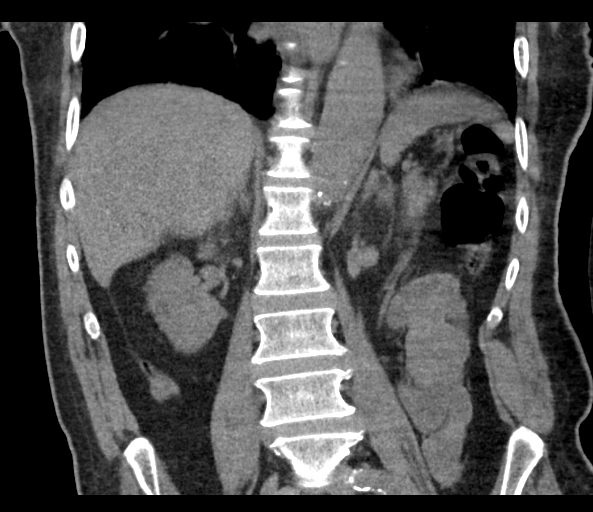

[16 of 46 positions shown; findings below may reference images not displayed]

FINDINGS: Lower chest: Emphysema with bulla disease at the lung bases. Right
basilar densities are most compatible with right basilar
atelectasis. Small calcification along the posterior left pleural
surface.

Hepatobiliary: Limited evaluation of the liver and gallbladder due
to motion artifact. Hypodensity near the falciform ligament likely
represents focal fat. There appears to be a small gallbladder but
poorly characterized.

Pancreas: Unremarkable. No pancreatic ductal dilatation or
surrounding inflammatory changes.

Spleen: Spleen is small and poorly characterized.

Adrenals/Urinary Tract: Adrenal glands are within normal limits.
Hypodensity in the right kidney lower pole is indeterminate on this
noncontrast examination but could represent a cyst. Difficult to
exclude hypodensities in the mid left kidney. No hydronephrosis.

Stomach/Bowel: Transverse colon extends caudal to the stomach. The
left side of the transverse colon and splenic flexure are lateral to
the stomach. No bowel structures anterior to the stomach. Small to
moderate sized hiatal hernia. Normal appearance of the duodenum and
visualized small bowel.

Vascular/Lymphatic: Aneurysm of the proximal abdominal aorta at the
level of the hiatus and supra celiac region. Proximal abdominal
aorta measures up to 3.9 cm. The descending thoracic aorta measures
3.2 cm. Heavily calcified coronary arteries. Atherosclerotic
calcifications in the abdominal aorta.

Other: Negative for ascites.  No free air.

Musculoskeletal: No acute bone abnormality.
IMPRESSION: 1. Anatomy should be amendable for a percutaneous gastrostomy tube
placement. A potentially complicating issue is a small to moderate
sized hiatal hernia.
2. Aneurysm of the proximal abdominal aorta measuring up to 3.9 cm.
Recommend followup by ultrasound in 2 years. This recommendation
follows ACR consensus guidelines: White Paper of the ACR Incidental
[DATE]. Aortic aneurysm NOS (15BUO-RMO.Q).
3. Aortic Atherosclerosis (15BUO-BWI.I) and Emphysema (15BUO-QIB.J).
